# Patient Record
Sex: Female | Born: 1945 | Race: White | Hispanic: No | Smoking: Former smoker
Health system: Southern US, Community
[De-identification: ages and names within clinical notes are randomized; demographics above are authoritative.]

## PROBLEM LIST (undated history)

## (undated) ENCOUNTER — Ambulatory Visit: Admission: EM | Source: Home / Self Care

## (undated) DIAGNOSIS — I10 Essential (primary) hypertension: Secondary | ICD-10-CM

## (undated) DIAGNOSIS — I639 Cerebral infarction, unspecified: Secondary | ICD-10-CM

## (undated) DIAGNOSIS — F419 Anxiety disorder, unspecified: Secondary | ICD-10-CM

## (undated) DIAGNOSIS — F32A Depression, unspecified: Secondary | ICD-10-CM

## (undated) DIAGNOSIS — F329 Major depressive disorder, single episode, unspecified: Secondary | ICD-10-CM

## (undated) HISTORY — DX: Depression, unspecified: F32.A

## (undated) HISTORY — DX: Cerebral infarction, unspecified: I63.9

## (undated) HISTORY — DX: Anxiety disorder, unspecified: F41.9

## (undated) HISTORY — DX: Major depressive disorder, single episode, unspecified: F32.9

---

## 2000-07-29 HISTORY — PX: DILATION AND CURETTAGE OF UTERUS: SHX78

## 2003-07-30 HISTORY — PX: FACELIFT: SHX1566

## 2013-03-19 DIAGNOSIS — F411 Generalized anxiety disorder: Secondary | ICD-10-CM | POA: Insufficient documentation

## 2015-07-04 ENCOUNTER — Emergency Department (HOSPITAL_COMMUNITY): Payer: Medicare Other

## 2015-07-04 ENCOUNTER — Encounter (HOSPITAL_COMMUNITY): Payer: Self-pay

## 2015-07-04 ENCOUNTER — Emergency Department (HOSPITAL_COMMUNITY)
Admission: EM | Admit: 2015-07-04 | Discharge: 2015-07-04 | Disposition: A | Discharge status: Home / Self Care | Payer: Medicare Other | Attending: Emergency Medicine | Admitting: Emergency Medicine

## 2015-07-04 DIAGNOSIS — IMO0001 Reserved for inherently not codable concepts without codable children: Secondary | ICD-10-CM

## 2015-07-04 DIAGNOSIS — Y998 Other external cause status: Secondary | ICD-10-CM | POA: Diagnosis not present

## 2015-07-04 DIAGNOSIS — W01198A Fall on same level from slipping, tripping and stumbling with subsequent striking against other object, initial encounter: Secondary | ICD-10-CM | POA: Insufficient documentation

## 2015-07-04 DIAGNOSIS — Z23 Encounter for immunization: Secondary | ICD-10-CM | POA: Diagnosis not present

## 2015-07-04 DIAGNOSIS — S8991XA Unspecified injury of right lower leg, initial encounter: Secondary | ICD-10-CM | POA: Diagnosis present

## 2015-07-04 DIAGNOSIS — Y9389 Activity, other specified: Secondary | ICD-10-CM | POA: Diagnosis not present

## 2015-07-04 DIAGNOSIS — R030 Elevated blood-pressure reading, without diagnosis of hypertension: Secondary | ICD-10-CM | POA: Diagnosis not present

## 2015-07-04 DIAGNOSIS — Z79899 Other long term (current) drug therapy: Secondary | ICD-10-CM | POA: Insufficient documentation

## 2015-07-04 DIAGNOSIS — Y9289 Other specified places as the place of occurrence of the external cause: Secondary | ICD-10-CM | POA: Diagnosis not present

## 2015-07-04 DIAGNOSIS — Z87891 Personal history of nicotine dependence: Secondary | ICD-10-CM | POA: Diagnosis not present

## 2015-07-04 DIAGNOSIS — L03115 Cellulitis of right lower limb: Secondary | ICD-10-CM | POA: Insufficient documentation

## 2015-07-04 MED ORDER — TETANUS-DIPHTH-ACELL PERTUSSIS 5-2.5-18.5 LF-MCG/0.5 IM SUSP
0.5000 mL | Freq: Once | INTRAMUSCULAR | Status: AC
  Administered 2015-07-04: 0.5 mL via INTRAMUSCULAR
  Filled 2015-07-04: qty 0.5

## 2015-07-04 MED ORDER — DOXYCYCLINE HYCLATE 100 MG PO CAPS: 100.0000 mg | ORAL_CAPSULE | Freq: Two times a day (BID) | ORAL | Status: DC

## 2015-07-04 MED ORDER — TETANUS-DIPHTHERIA TOXOIDS TD 5-2 LFU IM INJ
0.5000 mL | INJECTION | Freq: Once | INTRAMUSCULAR | Status: DC
  Filled 2015-07-04: qty 0.5

## 2015-07-04 NOTE — Discharge Instructions (Signed)
1. Medications: doxycyline is your antibiotic; Please take all of your antibiotics until finished!, continue usual home medications 2. Treatment: rest, drink plenty of fluids 3. Follow Up: Please follow up with your primary doctor in 3 days for discussion of your diagnoses and further evaluation after today's visit; Please return to the ER for fever, worsening pain, any new or worsening symptoms, any additional concerns.

## 2015-07-04 NOTE — ED Notes (Signed)
Patient transported to X-ray 

## 2015-07-04 NOTE — ED Notes (Signed)
Pt fell on Thanksgiving day.  Continues to have leg pain and swelling.  Went to urgent care and sent here for cellulitis.  Also with elevated bp

## 2015-07-04 NOTE — Progress Notes (Signed)
69 yr old medicare covered female states she fell after Thanksgiving going out her sliding [place] and fell Pt states she sustained injury to the right side of her head, arm, leg and foot Pt noted with resolving bruises/abrasions Pt pointed out swelling of right foot  Pt confirmed pcp as Dr Wilhemena Durie EPIC updated  Pt very pleasant and cooperative

## 2015-07-04 NOTE — ED Provider Notes (Signed)
CSN: [id number]     Arrival date & time 07/04/15  1622 History   First MD Initiated Contact with Patient 07/04/15 1631     Chief Complaint  Patient presents with  . Leg Pain     (Consider location/radiation/quality/duration/timing/severity/associated sxs/prior Treatment) Patient is a 69 y.o. female presenting with leg pain. The history is provided by the patient and medical records. No language interpreter was used.  Leg Pain Associated symptoms: no back pain and no neck pain    Michele Tyler is a 69 y.o. female  With no pertinent PMH who presents to the Emergency Department complaining of right leg pain. Pt. States she fell on Thanksgiving, rolling her right ankle, and hitting right shin on sliding door tract. Patient notes immediate swelling to the shin area and superficial cuts. Pt. States the area has been healing well, swelling is improved, pain occurs with palpation and certain movements. States redness began to occur over the last two days, so she went to the urgent care to get leg checked - urgent care sent her here to further evaluation. Denies fever.    History reviewed. No pertinent past medical history. History reviewed. No pertinent past surgical history. History reviewed. No pertinent family history. Social History  Substance Use Topics  . Smoking status: Former Research scientist (life sciences)  . Smokeless tobacco: None  . Alcohol Use: Yes     Comment: social   OB History    No data available     Review of Systems  Constitutional: Negative.   HENT: Negative for congestion, rhinorrhea and sore throat.   Eyes: Negative for visual disturbance.  Respiratory: Negative for cough, shortness of breath and wheezing.   Cardiovascular: Negative.   Gastrointestinal: Negative for nausea, vomiting, abdominal pain, diarrhea and constipation.  Musculoskeletal: Positive for myalgias. Negative for back pain, arthralgias and neck pain.  Skin: Positive for color change.  Neurological: Negative for  dizziness, weakness and headaches.  Hematological: Does not bruise/bleed easily.      Allergies  Codeine  Home Medications   Prior to Admission medications   Medication Sig Start Date End Date Taking? Authorizing Provider  doxycycline (VIBRAMYCIN) 100 MG capsule Take 1 capsule (100 mg total) by mouth 2 (two) times daily. 07/04/15   Ozella Almond Ward, PA-C  naproxen sodium (ANAPROX) 220 MG tablet Take 220-440 mg by mouth daily as needed (pain).   Yes Historical Provider, MD  nortriptyline (PAMELOR) 25 MG capsule Take 25 mg by mouth at bedtime. 06/05/15  Yes Historical Provider, MD   BP 177/79 mmHg  Pulse 104  Temp(Src) 97.7 F (36.5 C) (Oral)  Resp 18  SpO2 100% Physical Exam  Constitutional: She is oriented to person, place, and time. She appears well-developed and well-nourished.  Alert and in no acute distress  HENT:  Head: Normocephalic and atraumatic.  Cardiovascular: Normal rate, regular rhythm, normal heart sounds and intact distal pulses.  Exam reveals no gallop and no friction rub.   No murmur heard. Pulmonary/Chest: Effort normal and breath sounds normal. No respiratory distress. She has no wheezes. She has no rales. She exhibits no tenderness.  Abdominal: She exhibits no mass. There is no rebound and no guarding.  Abdomen soft, non-tender, non-distended Bowel sounds positive in all four quadrants  Musculoskeletal: She exhibits no edema.  Right ankle swelling with no bony tenderness of malleoli or 5th metatarsal - moving extremity well, able to bear weight.   Neurological: She is alert and oriented to person, place, and time.  Bilateral lower extremities  neurovascularly intact.   Skin: Skin is warm and dry. No rash noted.     Psychiatric: She has a normal mood and affect. Her behavior is normal. Judgment and thought content normal.  Nursing note and vitals reviewed.   ED Course  Procedures (including critical care time) Labs Review Labs Reviewed - No data to  display  Imaging Review Dg Tibia/fibula Right  07/04/2015  CLINICAL DATA:  Status post fall 2 weeks ago, with persistent large soft tissue hematoma just distal to the right knee. Associated pain and discoloration. Initial encounter. EXAM: RIGHT TIBIA AND FIBULA - 2 VIEW COMPARISON:  None. FINDINGS: There is no evidence of fracture or dislocation. The tibia and fibula appear intact. Focal soft tissue swelling is noted along the anterior aspect of the proximal lower leg. The knee joint is grossly unremarkable. A fabella is noted. Mild subcortical cystic change is noted at the upper pole of the patella. No knee joint effusion is seen. The ankle mortise is incompletely assessed, but appears grossly unremarkable. No radiopaque foreign bodies are seen. IMPRESSION: No evidence of fracture or dislocation. Focal soft tissue swelling noted along the anterior aspect of the proximal lower leg. Electronically Signed   By: Garald Balding M.D.   On: 07/04/2015 18:28   I have personally reviewed and evaluated these images and lab results as part of my medical decision-making.   EKG Interpretation None      MDM   Final diagnoses:  Cellulitis of right lower extremity  Elevated blood pressure   Michele Tyler presents with likely cellulitis of right mid-shin - injury to this area with abrasions/swelling occurred 2/2 fall ~ 2 weeks ago. Pt. States swelling is improving and she has walked 3 miles every day since fall.   Xrays show only focal soft tissue swelling. Will dc to home with abx and return precautions. Discussed BP and recommended PCP follow up.   Patient seen by and discussed with Dr. Tamera Punt who agrees with treatment plan.    [facility] Ward, PA-C 07/04/15 1855  Malvin Johns, MD 07/05/15 0000

## 2015-11-06 LAB — LIPID PANEL
Cholesterol: 224 — AB (ref 0–200)
HDL: 97 — AB (ref 35–70)
LDL Cholesterol: 101
Triglycerides: 130 (ref 40–160)

## 2015-11-06 LAB — BASIC METABOLIC PANEL
BUN: 12 (ref 4–21)
Creatinine: 0.8 (ref 0.5–1.1)
Glucose: 89
Potassium: 4.5 (ref 3.4–5.3)
Sodium: 138 (ref 137–147)

## 2015-12-27 LAB — POCT ERYTHROCYTE SEDIMENTATION RATE, NON-AUTOMATED: Sed Rate: 2

## 2015-12-27 LAB — TSH: TSH: 0.64 (ref 0.41–5.90)

## 2018-01-21 ENCOUNTER — Ambulatory Visit: Payer: Medicare Other | Admitting: Family Medicine

## 2018-02-18 ENCOUNTER — Ambulatory Visit: Payer: Medicare Other | Admitting: Family Medicine

## 2018-02-18 ENCOUNTER — Encounter: Payer: Self-pay | Admitting: Family Medicine

## 2018-02-18 VITALS — BP 154/82 | HR 83 | Ht 64.0 in | Wt 118.5 lb

## 2018-02-18 DIAGNOSIS — Z8679 Personal history of other diseases of the circulatory system: Secondary | ICD-10-CM | POA: Diagnosis not present

## 2018-02-18 DIAGNOSIS — F39 Unspecified mood [affective] disorder: Secondary | ICD-10-CM | POA: Insufficient documentation

## 2018-02-18 DIAGNOSIS — I10 Essential (primary) hypertension: Secondary | ICD-10-CM | POA: Insufficient documentation

## 2018-02-18 MED ORDER — NORTRIPTYLINE HCL 25 MG PO CAPS: 25.0000 mg | ORAL_CAPSULE | Freq: Every day | ORAL | 0 refills | Status: DC

## 2018-02-18 MED ORDER — BUSPIRONE HCL 5 MG PO TABS: 5.0000 mg | ORAL_TABLET | Freq: Three times a day (TID) | ORAL | 1 refills | Status: DC

## 2018-02-18 NOTE — Progress Notes (Signed)
New patient office visit note:  Impression and Recommendations:    1. H/O: HTN (hypertension)   2. Mood disorder ([place])      Lifestyle - Discussed health philosophy, importance of diet and activity  -- Requested patient to create a healthcare maintenance appointment in order to complete a total physical.  - Recommended an additional appointment to obtain blood work three days prior to healthcare maintenance appointment.Counseled patient on necessity of fasting for accurate blood work  Mood - Recommended increasing buspar to 5mg  TID buspar to even out anxiety and mood. Counseled patient on risks and benefits of medication.  - Counseled patient on benefits of counseling, meditation, exercise and medication in conjunction to elevate and stabilize mood. Described the purpose of therapy in depth.   Blood Pressure - Explained goal HTN of 140/90 or less due to age and lack of comorbidity.   -BP elevated today; not at goal. Will check at home and bring in BP log next OV.  - Recommended continuing work to   [employer] Exam - Explained slack of necessity for PAP smear or Gyno exam due to age and lack of family history. Also explained medicare requirements for appointment coverage.  BMI - Explained correlation of increasing BMI and increasing risk for disease.  Explained to patient what BMI refers to, and what it means medically.    Told patient to think about it as a "medical risk stratification measurement" and how increasing BMI is associated with increasing risk/ or worsening state of various diseases such as hypertension, hyperlipidemia, diabetes, premature OA, depression etc.  -American Heart Association guidelines for healthy diet, basically Mediterranean diet, and exercise guidelines of 30 minutes 5 days per week or more discussed in detail.  -Health counseling performed.  All questions answered.  Colonoscopy -Discussed requirements for colonoscopy and encouraged patient  to create an appointment for a colonoscopy or stool analysis.   Education and routine counseling performed. Handouts provided.   Meds ordered this encounter  Medications  . busPIRone (BUSPAR) 5 MG tablet    Sig: Take 1 tablet (5 mg total) by mouth 3 (three) times daily.    Dispense:  90 tablet    Refill:  1  . nortriptyline (PAMELOR) 25 MG capsule    Sig: Take 1 capsule (25 mg total) by mouth at bedtime.    Dispense:  90 capsule    Refill:  0    Gross side effects, risk and benefits, and alternatives of medications discussed with patient.  Patient is aware that all medications have potential side effects and we are unable to predict every side effect or drug-drug interaction that may occur.  Expresses verbal understanding and consents to current therapy plan and treatment regimen.  Return for Chronic OV w me near future & FBW 2-3d prior.  Please see AVS handed out to patient at the end of our visit for further patient instructions/ counseling done pertaining to today's office visit.    Note: This document was prepared using Dragon voice recognition software and may include unintentional dictation errors.     This document serves as a record of services personally performed by Mellody Dance, MD. It was created on her behalf by Georga Bora, a trained medical scribe. The creation of this record is based on the scribe's personal observations and the provider's statements to them.   I have reviewed the above medical documentation for accuracy and completeness and I concur.  Mellody Dance 02/19/18 8:14 AM    ----------------------------------------------------------------------------------------------------------------------  Subjective:    Chief complaint:   Chief Complaint  Patient presents with  . Establish Care   HPI: Michele Tyler is a pleasant 72 y.o. female who presents to [place] [facility] today to review their medical history with me  and establish care.   I asked the patient to review their chronic problem list with me to ensure everything was updated and accurate.    All recent office visits with other providers, any medical records that patient brought in etc  - I reviewed today.     We asked pt to get [place] their medical records from [facility] providers/ specialists that they had seen within the past 3-5 years- if they are in private practice and/or do not work for [employer], [facility], [place], [place] or [employer] owned practice.  Told them to call their specialists to clarify this if they are not sure.    Medical History  - Patient suffers from depression and anxiety. Takes Busibar and Pemoline  - She complains of struggling with sleep. Declines interest or belief in therapists or life coaches.  - Reports SSRI's increased anxiety, failed drug classes of effexer and wellbutrin, and cymbalta.  - States she wakes with a panic attack every morning, and anxiety waxes and wanes throughout day.  - Patient states frustration with medicare insurance and lack of physical and blood work.  - She has fears of fasting for colonoscopy and requested information about ways to prepare.  - PAP Smear in 2017, normal  Surgical History - D&C in 2002  - Facelift procedure in 2005  Social History  - Alcohol use; 1-2 glasses of wine per week.  - Quit smoking around 1988 and smoked roughly a pack a day.  - [place], worked for [employer] and retired around 2014.   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 Has 6 dogsincluding fosters, and lives alone.  - Patient is non-religious.   Family History  - Father suffered from depression, HTN, died in 64 at 15. States father likely died from "old age" - States parents decline of  mobility with age lead to her decision to excerics regularly  - Mother had high cholesterol, HTN, anxiety.      Wt Readings from Last 3 Encounters:  02/18/18 118 lb 8 oz (53.8 kg)   BP Readings from Last 3 Encounters:  02/18/18 (!) 154/82  07/04/15 (!) 168/48   Pulse Readings from Last 3 Encounters:  02/18/18 83  07/04/15 99   BMI Readings from Last 3 Encounters:  02/18/18 20.34 kg/m    Patient Care Team    Relationship Specialty Notifications Start End  Mellody Dance, DO PCP - General Family Medicine  02/18/18     Patient Active Problem List   Diagnosis Date Noted  . H/O: HTN (hypertension) 02/18/2018  . Mood disorder ([place]) 02/18/2018  . Generalized anxiety disorder 03/19/2013     Past Medical History:  Diagnosis Date  . Anxiety   . Depression      Past Medical History:  Diagnosis Date  . Anxiety   . Depression      Past Surgical History:  Procedure Laterality Date  . DILATION AND CURETTAGE OF UTERUS  2002  . FACELIFT  2005  Family History  Problem Relation Age of Onset  . Hypertension Mother   . Depression Father   . Hypertension Father      Social History   Substance and Sexual Activity  Drug Use No     Social History   Substance and Sexual Activity  Alcohol Use Yes  . Alcohol/week: 0.6 - 1.2 oz  . Types: 1 - 2 Standard drinks or equivalent per week   Comment: social     Social History   Tobacco Use  Smoking Status Former Smoker  . Packs/day: 1.00  . Types: Cigarettes  . Last attempt to quit: 1988  . Years since quitting: 31.5  Smokeless Tobacco Never Used     Current Meds  Medication Sig  . nortriptyline (PAMELOR) 25 MG capsule Take 1 capsule (25 mg total) by mouth at bedtime.  . [DISCONTINUED] busPIRone (BUSPAR) 15 MG tablet Take 1 tablet by mouth daily.  . [DISCONTINUED] nortriptyline (PAMELOR) 25 MG capsule Take 25 mg by mouth at bedtime.    Allergies: Codeine   Review of Systems  Constitutional:  Positive for fever. Negative for chills, diaphoresis, malaise/fatigue and weight loss.  HENT: Negative for congestion, sore throat and tinnitus.   Eyes: Negative for blurred vision, double vision and photophobia.  Respiratory: Negative for cough and wheezing.   Cardiovascular: Negative for chest pain and palpitations.  Gastrointestinal: Negative for blood in stool, diarrhea, nausea and vomiting.  Genitourinary: Negative for dysuria, frequency and urgency.  Musculoskeletal: Negative for joint pain and myalgias.  Skin: Negative for itching and rash.  Neurological: Negative for dizziness, focal weakness, weakness and headaches.  Endo/Heme/Allergies: Negative for environmental allergies and polydipsia. Does not bruise/bleed easily.  Psychiatric/Behavioral: Positive for depression (chronic). Negative for memory loss. The patient is nervous/anxious (chronic). The patient does not have insomnia.      Objective:   Blood pressure (!) 154/82, pulse 83, height 5\' 4"  (1.626 m), weight 118 lb 8 oz (53.8 kg), SpO2 98 %. Body mass index is 20.34 kg/m. General: Well Developed, well nourished, and in no acute distress.  Neuro: Alert and oriented x3, extra-ocular muscles intact, sensation grossly intact.  HEENT:[place]/AT, PERRLA, neck supple, No carotid bruits Skin: no gross rashes  Cardiac: Regular rate and rhythm Respiratory: Essentially clear to auscultation bilaterally. Not using accessory muscles, speaking in full sentences.  Abdominal: not grossly distended Musculoskeletal: Ambulates w/o diff, FROM * 4 ext.  Vasc: less 2 sec cap RF, warm and pink  Psych:  No HI/SI, judgement and insight good, Euthymic mood. Full Affect.    No results found for this or any previous visit (from the past 2160 hour(s)).

## 2018-02-18 NOTE — Patient Instructions (Signed)
Please realize, EXERCISE IS MEDICINE!  -  American Heart Association [facility]) guidelines for exercise : If you are in good health, without any medical conditions, you should engage in 150 minutes of moderate intensity aerobic activity per week.  This means you should be huffing and puffing throughout your workout.   Engaging in regular exercise will improve brain function and memory, as well as improve mood, boost immune system and help with weight management.  As well as the other, more well-known effects of exercise such as decreasing blood sugar levels, decreasing blood pressure,  and decreasing bad cholesterol levels/ increasing good cholesterol levels.     -  The AHA strongly endorses consumption of a diet that contains a variety of foods from all the food categories with an emphasis on fruits and vegetables; fat-free and low-fat dairy products; cereal and grain products; legumes and nuts; and fish, poultry, and/or extra lean meats.    Excessive food intake, especially of foods high in saturated and trans fats, sugar, and salt, should be avoided.    Adequate water intake of roughly 1/2 of your weight in pounds, should equal the ounces of water per day you should drink.  So for instance, if you're 200 pounds, that would be 100 ounces of water per day.         Mediterranean Diet  Why follow it? Research shows. . Those who follow the Mediterranean diet have a reduced risk of heart disease  . The diet is associated with a reduced incidence of Parkinson's and Alzheimer's diseases . People following the diet may have longer life expectancies and lower rates of chronic diseases  . The Dietary Guidelines for Americans recommends the Mediterranean diet as an eating plan to promote health and prevent disease  What Is the Mediterranean Diet?  . Healthy eating plan based on typical foods and recipes of Mediterranean-style cooking . The diet is primarily a plant based diet; these foods should make up a  majority of meals   Starches - Plant based foods should make up a majority of meals - They are an important sources of vitamins, minerals, energy, antioxidants, and fiber - Choose whole grains, foods high in fiber and minimally processed items  - Typical grain sources include wheat, oats, barley, corn, brown rice, bulgar, farro, millet, polenta, couscous  - Various types of beans include chickpeas, lentils, fava beans, black beans, white beans   Fruits  Veggies - Large quantities of antioxidant rich fruits & veggies; 6 or more servings  - Vegetables can be eaten raw or lightly drizzled with oil and cooked  - Vegetables common to the traditional Mediterranean Diet include: artichokes, arugula, beets, broccoli, brussel sprouts, cabbage, carrots, celery, collard greens, cucumbers, eggplant, kale, leeks, lemons, lettuce, mushrooms, okra, onions, peas, peppers, potatoes, pumpkin, radishes, rutabaga, shallots, spinach, sweet potatoes, turnips, zucchini - Fruits common to the Mediterranean Diet include: apples, apricots, avocados, cherries, clementines, dates, figs, grapefruits, grapes, melons, nectarines, oranges, peaches, pears, pomegranates, strawberries, tangerines  Fats - Replace butter and margarine with healthy oils, such as olive oil, canola oil, and tahini  - Limit nuts to no more than a handful a day  - Nuts include walnuts, almonds, pecans, pistachios, pine nuts  - Limit or avoid candied, honey roasted or heavily salted nuts - Olives are central to the Mediterranean diet - can be eaten whole or used in a variety of dishes   Meats Protein - Limiting red meat: no more than a few times a month -  When eating red meat: choose lean cuts and keep the portion to the size of deck of cards - Eggs: approx. 0 to 4 times a week  - Fish and lean poultry: at least 2 a week  - Healthy protein sources include, chicken, [place], lean beef, lamb - Increase intake of seafood such as tuna, salmon, trout,  mackerel, shrimp, scallops - Avoid or limit high fat processed meats such as sausage and bacon  Dairy - Include moderate amounts of low fat dairy products  - Focus on healthy dairy such as fat free yogurt, skim milk, low or reduced fat cheese - Limit dairy products higher in fat such as whole or 2% milk, cheese, ice cream  Alcohol - Moderate amounts of red wine is ok  - No more than 5 oz daily for women (all ages) and men older than age 72  - No more than 10 oz of wine daily for men younger than 12  Other - Limit sweets and other desserts  - Use herbs and spices instead of salt to flavor foods  - Herbs and spices common to the traditional Mediterranean Diet include: basil, bay leaves, chives, cloves, cumin, fennel, garlic, lavender, marjoram, mint, oregano, parsley, pepper, rosemary, sage, savory, sumac, tarragon, thyme   It's not just a diet, it's a lifestyle:  . The Mediterranean diet includes lifestyle factors typical of those in the region  . Foods, drinks and meals are best eaten with others and savored . Daily physical activity is important for overall good health . This could be strenuous exercise like running and aerobics . This could also be more leisurely activities such as walking, housework, yard-work, or taking the stairs . Moderation is the key; a balanced and healthy diet accommodates most foods and drinks . Consider portion sizes and frequency of consumption of certain foods   Meal Ideas & Options:  . Breakfast:  o Whole wheat toast or whole wheat English muffins with peanut butter & hard boiled egg o Steel cut oats topped with apples & cinnamon and skim milk  o Fresh fruit: banana, strawberries, melon, berries, peaches  o Smoothies: strawberries, bananas, greek yogurt, peanut butter o Low fat greek yogurt with blueberries and granola  o Egg white omelet with spinach and mushrooms o Breakfast couscous: whole wheat couscous, apricots, skim milk, cranberries  . Sandwiches:   o Hummus and grilled vegetables (peppers, zucchini, squash) on whole wheat bread   o Grilled chicken on whole wheat pita with lettuce, tomatoes, cucumbers or tzatziki  o Tuna salad on whole wheat bread: tuna salad made with greek yogurt, olives, red peppers, capers, green onions o Garlic rosemary lamb pita: lamb sauted with garlic, rosemary, salt & pepper; add lettuce, cucumber, greek yogurt to pita - flavor with lemon juice and black pepper  . Seafood:  o Mediterranean grilled salmon, seasoned with garlic, basil, parsley, lemon juice and black pepper o Shrimp, lemon, and spinach whole-grain pasta salad made with low fat greek yogurt  o Seared scallops with lemon orzo  o Seared tuna steaks seasoned salt, pepper, coriander topped with tomato mixture of olives, tomatoes, olive oil, minced garlic, parsley, green onions and cappers  . Meats:  o Herbed greek chicken salad with kalamata olives, cucumber, feta  o Red bell peppers stuffed with spinach, bulgur, lean ground beef (or lentils) & topped with feta   o Kebabs: skewers of chicken, tomatoes, onions, zucchini, squash  o [place] burgers: made with red onions, mint, dill, lemon juice, feta  cheese topped with roasted red peppers . Vegetarian o Cucumber salad: cucumbers, artichoke hearts, celery, red onion, feta cheese, tossed in olive oil & lemon juice  o Hummus and whole grain pita points with a greek salad (lettuce, tomato, feta, olives, cucumbers, red onion) o Lentil soup with celery, carrots made with vegetable broth, garlic, salt and pepper  o Tabouli salad: parsley, bulgur, mint, scallions, cucumbers, tomato, radishes, lemon juice, olive oil, salt and pepper.    How to Increase Your Level of Physical Activity  Getting regular physical activity is important for your overall health and well-being. Most people do not get enough exercise. There are easy ways to increase your level of physical activity, even if you have not been very active in  the past or you are just starting out. Why is physical activity important? Physical activity has many short-term and long-term health benefits. Regular exercise can:  Help you lose weight or maintain a healthy weight.  Strengthen your muscles and bones.  Boost your mood and improve self-esteem.  Reduce your risk of certain long-term (chronic) diseases, like heart disease, cancer, and diabetes.  Help you stay capable of walking and moving around (mobile) as you age.  Prevent accidents, such as falls, as you age.  Increase life expectancy.  What are the benefits of being physically active on a regular basis? In addition to improving your physical health, being physically active on most days of the week can help you in ways that you may not expect. Benefits of regular physical activity may include:  Feeling good about your body.  Being able to move around more easily and for longer periods of time without getting tired (increased stamina).  Finding new sources of fun and enjoyment.  Meeting new people who share a common interest.  Being able to fight off illness better (enhanced immunity).  Being able to sleep better.  What can happen if I am not physically active on a regular basis? Not getting enough physical activity can lead to an unhealthy lifestyle and future health problems. This can increase your chances of:  Becoming overweight or obese.  Becoming sick.  Developing chronic illnesses, like heart disease or diabetes.  Having mental health problems, like depression or anxiety.  Having sleep problems.  Having trouble walking or getting yourself around (reduced mobility).  Injuring yourself in a fall as you get older.  What steps can I take to be more physically active?  Check with your health care provider about how to get started. Ask your health care provider what activities are safe for you.  Start out slowly. Walking or doing some simple chair exercises is  a good place to start, especially if you have not been active before or for a long time.  Try to find activities that you enjoy. You are more likely to commit to an exercise routine if it does not feel like a chore.  If you have bone or joint problems, choose low-impact exercises, like walking or swimming.  Include physical activity in your everyday routine.  Invite friends or family members to exercise with you. This also will help you commit to your workout plan.  Set goals that you can work toward.  Aim for at least 150 minutes of moderate-intensity exercise each week. Examples of moderate-intensity exercise include walking or riding a bike. Where to find more information:  Centers for Disease Control and Prevention: [employer]is  [employer] on [employer] www.http://villegas.org/  ChooseMyPlate: [employer] Contact a health  care provider if:  You have headaches, muscle aches, or joint pain.  You feel dizzy or light-headed while exercising.  You faint.  You have chest pain while exercising. Summary  Exercise benefits your mind and body at any age, even if you are just starting out.  If you have a chronic illness or have not been active for a while, check with your health care provider before increasing your physical activity.  Choose activities that are safe and enjoyable for you.Ask your health care provider what activities are safe for you.  Start slowly. Tell your health care provider if you have problems as you start to increase your activity level. This information is not intended to replace advice given to you by your health care provider. Make sure you discuss any questions you have with your health care provider. Document Released: 07/04/2016 Document Revised: 07/04/2016 Document Reviewed: 07/04/2016 Elsevier Interactive Patient Education  [employer].

## 2018-03-20 ENCOUNTER — Other Ambulatory Visit: Payer: Medicare Other

## 2018-03-20 ENCOUNTER — Other Ambulatory Visit: Payer: Self-pay

## 2018-03-20 DIAGNOSIS — Z0000 Encounter for general adult medical examination without abnormal findings: Secondary | ICD-10-CM | POA: Diagnosis not present

## 2018-03-20 DIAGNOSIS — Z8679 Personal history of other diseases of the circulatory system: Secondary | ICD-10-CM | POA: Diagnosis not present

## 2018-03-21 LAB — COMPREHENSIVE METABOLIC PANEL
ALT: 14 IU/L (ref 0–32)
AST: 20 IU/L (ref 0–40)
Albumin/Globulin Ratio: 2 (ref 1.2–2.2)
Albumin: 4.3 g/dL (ref 3.5–4.8)
Alkaline Phosphatase: 71 IU/L (ref 39–117)
BUN/Creatinine Ratio: 14 (ref 12–28)
BUN: 10 mg/dL (ref 8–27)
Bilirubin Total: 0.4 mg/dL (ref 0.0–1.2)
CO2: 25 mmol/L (ref 20–29)
Calcium: 9.5 mg/dL (ref 8.7–10.3)
Chloride: 100 mmol/L (ref 96–106)
Creatinine, Ser: 0.72 mg/dL (ref 0.57–1.00)
GFR calc Af Amer: 98 mL/min/{1.73_m2} (ref >59)
GFR calc non Af Amer: 85 mL/min/{1.73_m2} (ref >59)
Globulin, Total: 2.1 g/dL (ref 1.5–4.5)
Glucose: 89 mg/dL (ref 65–99)
Potassium: 4.5 mmol/L (ref 3.5–5.2)
Sodium: 137 mmol/L (ref 134–144)
Total Protein: 6.4 g/dL (ref 6.0–8.5)

## 2018-03-21 LAB — CBC WITH DIFFERENTIAL/PLATELET
Basophils Absolute: 0 10*3/uL (ref 0.0–0.2)
Basos: 1 %
EOS (ABSOLUTE): 0.1 10*3/uL (ref 0.0–0.4)
Eos: 3 %
Hematocrit: 39.6 % (ref 34.0–46.6)
Hemoglobin: 13.2 g/dL (ref 11.1–15.9)
Immature Grans (Abs): 0 10*3/uL (ref 0.0–0.1)
Immature Granulocytes: 0 %
Lymphocytes Absolute: 0.8 10*3/uL (ref 0.7–3.1)
Lymphs: 19 %
MCH: 28.8 pg (ref 26.6–33.0)
MCHC: 33.3 g/dL (ref 31.5–35.7)
MCV: 87 fL (ref 79–97)
Monocytes Absolute: 0.4 10*3/uL (ref 0.1–0.9)
Monocytes: 10 %
Neutrophils Absolute: 2.7 10*3/uL (ref 1.4–7.0)
Neutrophils: 67 %
Platelets: 417 10*3/uL (ref 150–450)
RBC: 4.58 x10E6/uL (ref 3.77–5.28)
RDW: 12.9 % (ref 12.3–15.4)
WBC: 4.1 10*3/uL (ref 3.4–10.8)

## 2018-03-21 LAB — HEMOGLOBIN A1C
Est. average glucose Bld gHb Est-mCnc: 108 mg/dL
Hgb A1c MFr Bld: 5.4 % (ref 4.8–5.6)

## 2018-03-21 LAB — LIPID PANEL
Chol/HDL Ratio: 2.4 ratio (ref 0.0–4.4)
Cholesterol, Total: 210 mg/dL — ABNORMAL HIGH (ref 100–199)
HDL: 87 mg/dL (ref >39)
LDL Calculated: 108 mg/dL — ABNORMAL HIGH (ref 0–99)
Triglycerides: 76 mg/dL (ref 0–149)
VLDL Cholesterol Cal: 15 mg/dL (ref 5–40)

## 2018-03-21 LAB — T4, FREE: Free T4: 1.12 ng/dL (ref 0.82–1.77)

## 2018-03-21 LAB — TSH: TSH: 0.803 u[IU]/mL (ref 0.450–4.500)

## 2018-03-21 LAB — VITAMIN D 25 HYDROXY (VIT D DEFICIENCY, FRACTURES): Vit D, 25-Hydroxy: 46.9 ng/mL (ref 30.0–100.0)

## 2018-03-25 ENCOUNTER — Encounter: Payer: Self-pay | Admitting: Family Medicine

## 2018-03-25 ENCOUNTER — Ambulatory Visit: Payer: Medicare Other | Admitting: Family Medicine

## 2018-03-25 VITALS — BP 136/84 | HR 84 | Ht 64.0 in | Wt 117.0 lb

## 2018-03-25 DIAGNOSIS — E7800 Pure hypercholesterolemia, unspecified: Secondary | ICD-10-CM | POA: Insufficient documentation

## 2018-03-25 DIAGNOSIS — E785 Hyperlipidemia, unspecified: Secondary | ICD-10-CM

## 2018-03-25 DIAGNOSIS — F411 Generalized anxiety disorder: Secondary | ICD-10-CM | POA: Diagnosis not present

## 2018-03-25 DIAGNOSIS — Z1211 Encounter for screening for malignant neoplasm of colon: Secondary | ICD-10-CM

## 2018-03-25 DIAGNOSIS — F39 Unspecified mood [affective] disorder: Secondary | ICD-10-CM

## 2018-03-25 DIAGNOSIS — G4700 Insomnia, unspecified: Secondary | ICD-10-CM | POA: Diagnosis not present

## 2018-03-25 MED ORDER — BUSPIRONE HCL 15 MG PO TABS: 5.0000 mg | ORAL_TABLET | Freq: Three times a day (TID) | ORAL | 1 refills | Status: DC

## 2018-03-25 MED ORDER — NORTRIPTYLINE HCL 25 MG PO CAPS: 25.0000 mg | ORAL_CAPSULE | Freq: Every day | ORAL | 1 refills | Status: DC

## 2018-03-25 NOTE — Progress Notes (Signed)
Assessment and plan:  1. 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 Generalized anxiety disorder   3. Insomnia, unspecified type   4. Hyperlipidemia, unspecified hyperlipidemia type   5. Screening for colon cancer     Mood -Mood is poorly controlled  -Recommend she more consistently take her BuSpar 3 times daily, which she has not been doing.  -Recommended visiting a psychiatrist for further evaluation of medications; pt declined  -Patient declined going to a psychologist/ life coach for CBT- states she "does not want to pay someone to talk to her", despite me telling her about what they can offer her  -Educated pt on additional antidepressants and anti-anxiety medications such as SSRIs to combine with current medications; pt declines  -Discussed potentially increasing buspar dose and being more consistent to help with mood  -Discussed history of buspar and pamelar prescriptions from previous provider  -Extensively discussed different classes of drugs and 7 specific medications that have been proven to help with depression  -Educated pt on non-pharmaceutical options for managing stress such as increasing exercise, prayer, and spending time with friends  -Explained that depression and anxiety are medical conditions and that medical treatment there is not shame in seeking treatment   HTN -Essentially controlled HTN -Discussed impact of stress and anxiety on bp -Discussed impact of HTN on cardiovascular health and risk of heart attack or stroke -Requested pt bring in a bp log with next OV in order to determine need of medical treatment -Discussed methods of improving blood pressure through exercise, healthy diet, and relieving stress -Educated pt on goal bp for age -Encouraged pt to work on decreasing bp due to ASCVD risk    Cholesterol  -Encouraged pt  to consider beginning a cholesterol medication; pt declined -Discussed impact of trans, saturated and unsaturated fats on overall cholesterol levels -Educated impact of fatty carbs on triglyceride levels and how to maintain low levels -Discussed ways to find further information about high cholesterol foods to avoid -Explained ASCVD risk and impact of risk on decision about cholesterol medication  The 10-year ASCVD risk score Michele Tyler DC Jr., et al., 2013) is: 9.9%   Values used to calculate the score:     Age: 26 years     Sex: Female     Is Non-Hispanic African American: No     Diabetic: No     Tobacco smoker: No     Systolic Blood Pressure: 366 mmHg     Is BP treated: No     HDL Cholesterol: 87 mg/dL     Total Cholesterol: 210 mg/dL   Blood Panel  -Discussed healthy levels of vitamin D and calcium and amount to look for in supplements -Discussed impact of vitamins such as B12, B6, and ingredients in pt supplements -Discussed AST and ALT roles in determining liver function -Discussed BUN, serine/creatinine level in determining kidney function -Discussed CBC results and indications of each level tested  GI  -Recommended Beano or Gas-x to alleviate issues -Discussed use of a food journal to identify triggers and possible foods that cause gas  Pt was in the office today for 32.5+ minutes, with over 50% time spent in face to face counseling of patients various medical conditions, treatment plans of those medical conditions including medicine management and lifestyle modification, strategies to improve health and well being; and in coordination of care. SEE ABOVE TREATMENT PLAN FOR DETAILS   Return for 13mo  mood f/up.   Anticipatory guidance and routine counseling done re: condition, txmnt options and need for follow up. All questions of patient's were answered.   Gross side effects, risk and benefits, and alternatives of medications discussed with patient.  Patient is aware that all  medications have potential side effects and we are unable to predict every sideeffect or drug-drug interaction that may occur.  Expresses verbal understanding and consents to current therapy plan and treatment regiment.  Please see AVS handed out to patient at the end of our visit for additional patient instructions/ counseling done pertaining to today's office visit.  Note: This document was prepared using Dragon voice recognition software and may include unintentional dictation errors.    This document serves as a record of services personally performed by Mellody Dance, MD. It was created on her behalf by Georga Bora, a trained medical scribe. The creation of this record is based on the scribe's personal observations and the provider's statements to them.   I have reviewed the above medical documentation for accuracy and completeness and I concur.  Mellody Dance 03/26/18 2:28 PM   ----------------------------------------------------------------------------------------------------------------------  Subjective:   CC:   Michele Tyler is a 72 y.o. female who presents to [place] [facility] today for review and discussion of recent bloodwork that was done and discussion of mood.  All recent blood work that we ordered was reviewed with patient today.  Patient was counseled on all abnormalities and we discussed dietary and lifestyle changes that could help those values (also medications when appropriate).  Extensive health counseling performed and all patient's concerns/ questions were addressed.   Blood Panel 03/20/2018 -AST/ALT WNL -BUN/ Serine-Creatinine WNL -A1C at 5.4 -TSH/T3/T4 WNL  Lifestyle -Pt walks 60-90 min at least 3 times per week, roughly 3 miles each walk -States she also lifts weights "a couple days per week"  Mood -Last OV started Buspar TID, and pamelar for sleep -Previously saw Dr. Harless Litten for mood prescriptions, was taking buspar PRN    -States she has been making time to take the pills TID but has not been consistent  -Prefers the 8m size due to multiple scoring on the pill that allows her to get a 90 day prescription while still taking only 1/3 of pill at each time -Has not been experiencing SE  -Has been taking pamelar each night at night -Has not been experiencing SE from medications -Pt does not want to adjust sleep or mood medications -Says she has taken nortriptyline "since the 80's"  -Has been trying different medications since the 90's -Pt states she has "felt this way since her teenaged years" and elaborates to say she feels her depression is genetic as her parent struggled also -States she "feels like she'll have a heart attack because the anxiety and depression are just too high" -Has taken prozac in the past and states she could not sleep at all and her heart was pounding; believes "this class of drugs is just too much for me" -States she has taken paxil, celexa and in the 90's and felt it didn't work -States she has tried a lot of drugs and they did work but she felt "the dose is just to high and its just too much" -Doesn't believe she has taken trazedone and thought it was for pain  -States she doesn't believe in speaking to a psychiatrist because she feels "it's sad if you have pay someone to listen to your problems" and doesn't have a  pastor or religious leader -Says she's the listener in her friend groups and doesn't feel comfortable telling people about how she feels  CHOL HPI:  -Pt states she has been maintaining exercise levels and attempting to make good dietary choices -Is very hesitant to begin cholesterol medication and would like to recheck levels again before deciding  Last lipid panel as follows:  Lab Results  Component Value Date   CHOL 210 (H) 03/20/2018   HDL 87 03/20/2018   LDLCALC 108 (H) 03/20/2018   TRIG 76 03/20/2018   CHOLHDL 2.4 03/20/2018    Hepatic Function Latest Ref Rng  & Units 03/20/2018  Total Protein 6.0 - 8.5 g/dL 6.4  Albumin 3.5 - 4.8 g/dL 4.3  AST 0 - 40 IU/L 20  ALT 0 - 32 IU/L 14  Alk Phosphatase 39 - 117 IU/L 71  Total Bilirubin 0.0 - 1.2 mg/dL 0.4     HTN HPI: -Pt is not checking bp at home.  -Today, pt bp is at 140/85 -Believes her anxiety may be causing her bp to increase because she is "always so anxious"   - Smoking Status noted   - She denies new onset of: chest pain, exercise intolerance, shortness of breath, dizziness, visual changes, headache, lower extremity swelling or claudication.   Last 3 blood pressure readings in our office are as follows: BP Readings from Last 3 Encounters:  03/25/18 136/84  02/18/18 (!) 154/82  07/04/15 (!) 168/48    Filed Weights   03/25/18 1036  Weight: 117 lb (53.1 kg)         Wt Readings from Last 3 Encounters:  03/25/18 117 lb (53.1 kg)  02/18/18 118 lb 8 oz (53.8 kg)   BP Readings from Last 3 Encounters:  03/25/18 136/84  02/18/18 (!) 154/82  07/04/15 (!) 168/48   Pulse Readings from Last 3 Encounters:  03/25/18 84  02/18/18 83  07/04/15 99   BMI Readings from Last 3 Encounters:  03/25/18 20.08 kg/m  02/18/18 20.34 kg/m     Patient Care Team    Relationship Specialty Notifications Start End  Mellody Dance, DO PCP - General Family Medicine  02/18/18     Full medical history updated and reviewed in the office today  Patient Active Problem List   Diagnosis Date Noted  . Insomnia 03/25/2018  . Elevated LDL cholesterol level 03/25/2018  . Hyperlipidemia 03/25/2018  . H/O: HTN (hypertension) 02/18/2018  . Mood disorder ([place]) 02/18/2018  . Generalized anxiety disorder 03/19/2013    Past Medical History:  Diagnosis Date  . Anxiety   . Depression     Past Surgical History:  Procedure Laterality Date  . DILATION AND CURETTAGE OF UTERUS  2002  . FACELIFT  2005    Social History   Tobacco Use  . Smoking status: Former Smoker    Packs/day: 1.00    Types:  Cigarettes    Last attempt to quit: 1988    Years since quitting: 31.7  . Smokeless tobacco: Never Used  Substance Use Topics  . Alcohol use: Yes    Alcohol/week: 1.0 - 2.0 standard drinks    Types: 1 - 2 Standard drinks or equivalent per week    Comment: social    Family Hx: Family History  Problem Relation Age of Onset  . Hypertension Mother   . Depression Father   . Hypertension Father      Medications: Current Outpatient Medications  Medication Sig Dispense Refill  . Apple Cider Vinegar 600 MG  CAPS Take 1 capsule by mouth daily.    . Ascorbic Acid (VITAMIN C CR) 1500 MG TBCR Take 1 tablet by mouth daily.    . B Complex-Folic Acid (BALANCED X-528 PO) Take by mouth.    . busPIRone (BUSPAR) 15 MG tablet Take 0.5 tablets (7.5 mg total) by mouth 3 (three) times daily. 90 tablet 1  . CALCIUM-MAGNESUIUM-ZINC 333-133-8.3 MG TABS Take 4 tablets by mouth daily.    . Cholecalciferol (VITAMIN D3) 5000 units CAPS Take 1 capsule by mouth daily.    . Cyanocobalamin (B-12) 1000 MCG CAPS Take 1 capsule by mouth.    . Ferrous Sulfate (IRON) 325 (65 Fe) MG TABS Take 1 tablet by mouth daily.    . folic acid (FOLVITE) 413 MCG tablet Take 800 mcg by mouth daily.    . nortriptyline (PAMELOR) 25 MG capsule Take 1 capsule (25 mg total) by mouth at bedtime. 90 capsule 1  . Omega-3 Fatty Acids (OMEGA-3 FISH OIL PO) Take 1 capsule by mouth daily. Contains omega 3 450 mg and fish oil 1500 mg    . Soy Isoflavone 750 MG CAPS Take 1 capsule by mouth daily.    . TURMERIC PO Take 800 mg by mouth.    . vitamin A 8000 UNIT capsule Take 8,000 Units by mouth daily.    Marland Kitchen VITAMIN E-1000 PO Take 1 capsule by mouth daily.    . Vitamins-Lipotropics (BALANCED B-150 COMPLEX TR PO) Take 1 tablet by mouth daily. Contains b1 165m, b2 1548m b6 [id number] 150m40miacin 150 mg     No current facility-administered medications for this visit.     Allergies:  Allergies  Allergen Reactions  . Codeine Nausea Only      Review of Systems: General:   No F/C, wt loss Pulm:   No DIB, SOB, pleuritic chest pain Card:  No CP, palpitations Abd:  No n/v/d or pain Ext:  No inc edema from baseline  Objective:  Blood pressure 136/84, pulse 84, height _0  (1.626 m), weight 117 lb (53.1 kg), SpO2 98 %. Body mass index is 20.08 kg/m. Gen:   Well NAD, A and O *3 HEENT:    [place]/AT, EOMI,  MMM Lungs:   Normal work of breathing. CTA B/L, no Wh, rhonchi Heart:   RRR, S1, S2 WNL's, no MRG Abd:   No gross distention Exts:    warm, pink,  Brisk capillary refill, warm and well perfused.  Psych:    No HI/SI, judgement and insight good, Euthymic mood. Full Affect.   Recent Results (from the past 2160 hour(s))  VITAMIN D 25 Hydroxy (Vit-D Deficiency, Fractures)     Status: None   Collection Time: 03/20/18  8:27 AM  Result Value Ref Range   Vit D, 25-Hydroxy 46.9 30.0 - 100.0 ng/mL    Comment: Vitamin D deficiency has been defined by the [employer] guideline as a level of serum 25-OH vitamin D less than 20 ng/mL (1,2). The Endocrine Society went on to further define vitamin D insufficiency as a level between 21 and 29 ng/mL (2). 1. IOM (Institute of Medicine). 2010. Dietary reference    intakes for calcium and D. [place]    [employer] Holick MF, Binkley [place], Bischoff-Ferrari HA, et al.    Evaluation, treatment, and prevention of vitamin D    deficiency: an Endocrine Society clinical practice    guideline. JCEM. 2011 Jul; 96(7):1911-30.   TSH  Status: None   Collection Time: 03/20/18  8:27 AM  Result Value Ref Range   TSH 0.803 0.450 - 4.500 uIU/mL  Lipid panel     Status: Abnormal   Collection Time: 03/20/18  8:27 AM  Result Value Ref Range   Cholesterol, Total 210 (H) 100 - 199 mg/dL   Triglycerides 76 0 - 149 mg/dL   HDL 87 >39 mg/dL   VLDL Cholesterol Cal 15 5 - 40 mg/dL   LDL Calculated 108 (H) 0 - 99 mg/dL   Chol/HDL  Ratio 2.4 0.0 - 4.4 ratio    Comment:                                   T. Chol/HDL Ratio                                             Men  Women                               1/2 Avg.Risk  3.4    3.3                                   Avg.Risk  5.0    4.4                                2X Avg.Risk  9.6    7.1                                3X Avg.Risk 23.4   11.0   Hemoglobin A1c     Status: None   Collection Time: 03/20/18  8:27 AM  Result Value Ref Range   Hgb A1c MFr Bld 5.4 4.8 - 5.6 %    Comment:          Prediabetes: 5.7 - 6.4          Diabetes: >6.4          Glycemic control for adults with diabetes: <7.0    Est. average glucose Bld gHb Est-mCnc 108 mg/dL  Comprehensive metabolic panel     Status: None   Collection Time: 03/20/18  8:27 AM  Result Value Ref Range   Glucose 89 65 - 99 mg/dL   BUN 10 8 - 27 mg/dL   Creatinine, Ser 0.72 0.57 - 1.00 mg/dL   GFR calc non Af Amer 85 >59 mL/min/1.73   GFR calc Af Amer 98 >59 mL/min/1.73   BUN/Creatinine Ratio 14 12 - 28   Sodium 137 134 - 144 mmol/L   Potassium 4.5 3.5 - 5.2 mmol/L   Chloride 100 96 - 106 mmol/L   CO2 25 20 - 29 mmol/L   Calcium 9.5 8.7 - 10.3 mg/dL   Total Protein 6.4 6.0 - 8.5 g/dL   Albumin 4.3 3.5 - 4.8 g/dL   Globulin, Total 2.1 1.5 - 4.5 g/dL   Albumin/Globulin Ratio 2.0 1.2 - 2.2   Bilirubin Total 0.4 0.0 - 1.2 mg/dL   Alkaline Phosphatase 71 39 - 117 IU/L  AST 20 0 - 40 IU/L   ALT 14 0 - 32 IU/L  CBC with Differential/Platelet     Status: None   Collection Time: 03/20/18  8:27 AM  Result Value Ref Range   WBC 4.1 3.4 - 10.8 x10E3/uL   RBC 4.58 3.77 - 5.28 x10E6/uL   Hemoglobin 13.2 11.1 - 15.9 g/dL   Hematocrit 39.6 34.0 - 46.6 %   MCV 87 79 - 97 fL   MCH 28.8 26.6 - 33.0 pg   MCHC 33.3 31.5 - 35.7 g/dL   RDW 12.9 12.3 - 15.4 %   Platelets 417 150 - 450 x10E3/uL   Neutrophils 67 Not Estab. %   Lymphs 19 Not Estab. %   Monocytes 10 Not Estab. %   Eos 3 Not Estab. %   Basos 1 Not Estab. %    Neutrophils Absolute 2.7 1.4 - 7.0 x10E3/uL   Lymphocytes Absolute 0.8 0.7 - 3.1 x10E3/uL   Monocytes Absolute 0.4 0.1 - 0.9 x10E3/uL   EOS (ABSOLUTE) 0.1 0.0 - 0.4 x10E3/uL   Basophils Absolute 0.0 0.0 - 0.2 x10E3/uL   Immature Granulocytes 0 Not Estab. %   Immature Grans (Abs) 0.0 0.0 - 0.1 x10E3/uL  T4, free     Status: None   Collection Time: 03/20/18  8:27 AM  Result Value Ref Range   Free T4 1.12 0.82 - 1.77 ng/dL  Cologuard     Status: None   Collection Time: 03/31/18 12:00 AM  Result Value Ref Range   Cologuard

## 2018-03-25 NOTE — Patient Instructions (Addendum)
Please try to be consistent with your BuSpar taking one third of the tablet 3 times daily.  I believe this will help with mood.  If need be, we can always increase dose to get more effect if desired.  Also, please look into getting a blood pressure cuff, I have many patients that use the Omron blood pressure cuffs for a single person would be sufficient.  These run around $25-$35.  Also, make sure you take at least 1200 of calcium daily and let us know how much vitamin D you take daily.  We will see you in 6 months to recheck cholesterol otherwise, follow-up in about 2 to 3 months to see how you are doing on the consistent dose of the BuSpar and possibly modify those medications for mood.     Guidelines for a Low Cholesterol, Low Saturated Fat Diet   Fats - Limit total intake of fats and oils. - Avoid butter, stick margarine, shortening, lard, palm and coconut oils. - Limit mayonnaise, salad dressings, gravies and sauces, unless they are homemade with low-fat ingredients. - Limit chocolate. - Choose low-fat and nonfat products, such as low-fat mayonnaise, low-fat or non-hydrogenated peanut butter, low-fat or fat-free salad dressings and nonfat gravy. - Use vegetable oil, such as canola or olive oil. - Look for margarine that does not contain trans fatty acids. - Use nuts in moderate amounts. - Read ingredient labels carefully to determine both amount and type of fat present in foods. Limit saturated and trans fats! - Avoid high-fat processed and convenience foods.  Meats and Meat Alternatives - Choose fish, chicken, [place] and lean meats. - Use dried beans, peas, lentils and tofu. - Limit egg yolks to three to four per week. - If you eat red meat, limit to no more than three servings per week and choose loin or round cuts. - Avoid fatty meats, such as bacon, sausage, franks, luncheon meats and ribs. - Avoid all organ meats, including liver.  Dairy - Choose nonfat or low-fat milk,  yogurt and cottage cheese. - Most cheeses are high in fat. Choose cheeses made from non-fat milk, such as mozzarella and ricotta cheese. - Choose light or fat-free cream cheese and sour cream. - Avoid cream and sauces made with cream.  Fruits and Vegetables - Eat a wide variety of fruits and vegetables. - Use lemon juice, vinegar or "mist" olive oil on vegetables. - Avoid adding sauces, fat or oil to vegetables.  Breads, Cereals and Grains - Choose whole-grain breads, cereals, pastas and rice. - Avoid high-fat snack foods, such as granola, cookies, pies, pastries, doughnuts and croissants.  Cooking Tips - Avoid deep fried foods. - Trim visible fat off meats and remove skin from poultry before cooking. - Bake, broil, boil, poach or roast poultry, fish and lean meats. - Drain and discard fat that drains out of meat as you cook it. - Add little or no fat to foods. - Use vegetable oil sprays to grease pans for cooking or baking. - Steam vegetables. - Use herbs or no-oil marinades to flavor foods.

## 2018-03-31 DIAGNOSIS — Z1211 Encounter for screening for malignant neoplasm of colon: Secondary | ICD-10-CM | POA: Diagnosis not present

## 2018-03-31 LAB — COLOGUARD

## 2018-04-01 ENCOUNTER — Other Ambulatory Visit: Payer: Self-pay

## 2018-05-06 DIAGNOSIS — Z1231 Encounter for screening mammogram for malignant neoplasm of breast: Secondary | ICD-10-CM | POA: Diagnosis not present

## 2018-07-01 ENCOUNTER — Ambulatory Visit (INDEPENDENT_AMBULATORY_CARE_PROVIDER_SITE_OTHER): Payer: Medicare Other | Admitting: Family Medicine

## 2018-07-01 ENCOUNTER — Encounter: Payer: Self-pay | Admitting: Family Medicine

## 2018-07-01 VITALS — BP 138/82 | HR 91 | Temp 97.8°F | Ht 64.0 in | Wt 118.7 lb

## 2018-07-01 DIAGNOSIS — F411 Generalized anxiety disorder: Secondary | ICD-10-CM

## 2018-07-01 DIAGNOSIS — G4700 Insomnia, unspecified: Secondary | ICD-10-CM

## 2018-07-01 DIAGNOSIS — F39 Unspecified mood [affective] disorder: Secondary | ICD-10-CM

## 2018-07-01 DIAGNOSIS — R030 Elevated blood-pressure reading, without diagnosis of hypertension: Secondary | ICD-10-CM | POA: Diagnosis not present

## 2018-07-01 DIAGNOSIS — E785 Hyperlipidemia, unspecified: Secondary | ICD-10-CM

## 2018-07-01 MED ORDER — ATORVASTATIN CALCIUM 20 MG PO TABS: 10.0000 mg | ORAL_TABLET | Freq: Every day | ORAL | 3 refills | Status: DC

## 2018-07-01 NOTE — Progress Notes (Signed)
Impression and Recommendations:    1. 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 Generalized anxiety disorder   3. Insomnia, unspecified type   4. Hyperlipidemia, unspecified hyperlipidemia type   5. Elevated blood-pressure reading without diagnosis of hypertension-may be anxiety mediated     1. Hypertension - Blood pressure elevated on intake today.  - Educated patient today at length about the risks of high blood pressure, especially the long term risk of congestive heart failure.  - Strongly advised patient to check her blood pressure at home.  - Reviewed goal as under 140/90 on a regular basis.  - Reviewed ambulatory BP monitoring with patient.  Encouraged patient to check her blood pressure at least every other day, at different times of day. Keep log and bring in next OV.  - Lifestyle changes such as dash diet and engaging in a regular exercise program discussed with patient.   - Patient knows to send log of her blood pressures in to the office as soon as she measures them.  - Patient knows to call in if she experiences concerns.  2. Mood - Anxiety - Anxiety is controlled better at this time.  Per patient, feels less "spikes" of anxiety and more of a "baseline" anxiety.  - Pt has been taking 15 mg of Buspar per day.    - Advised patient to up her dose to 20 mg per day to use the rest of her 5 mg tablets.  Then have her prescription filled and take her 15 mg tablets as prescribed.  See med list today.  - Patient tolerating meds well without complication.  Denies S-E  - Reviewed the "spokes of the wheel" of mood and health management.  Stressed the importance of ongoing prudent habits, including regular exercise, appropriate sleep hygiene, healthful dietary habits, and prayer/meditation to relax.  - Patient declined counseling, declined SSRI, declined psychiatrist last appointment.  3. Hyperlipidemia - LDL  108 last check - Need for medication reviewed with patient today.  Pt declined cholesterol medications last appointment.  Patient agrees to begin cholesterol medications today.  - Patient knows to call in with concerns as advised.  - Patient will begin with half tablet and slowly wean up dose.  See med list today.  - Reviewed prudent practices with medication, taking it at bedtime.  Reviewed that as long as patient is adequately hydrated and engaging in regular physical activity, her S-E on medication should be minimized.  - Repeat fasting lipid panel and CMP in 3-4 months.  The 10-year ASCVD risk score Mikey Bussing DC Brooke Bonito., et al., 2013) is: 10.1%   Values used to calculate the score:     Age: 71 years     Sex: Female     Is Non-Hispanic African American: No     Diabetic: No     Tobacco smoker: No     Systolic Blood Pressure: 939 mmHg     Is BP treated: No     HDL Cholesterol: 87 mg/dL     Total Cholesterol: 210 mg/dL  - Discussed the dangers of high cholesterol in tandem with high blood pressure with patient today.  - Dietary changes such as low saturated & trans fat and low carb/ ketogenic diets discussed with patient.  Encouraged regular exercise.  4. Lifestyle & Preventative Health Maintenance - Advised patient to continue working toward exercising to improve overall mental, physical, and emotional health.    American  Heart Association guidelines for healthy diet, basically Mediterranean diet, and exercise guidelines of 30 minutes 5 days per week or more discussed in detail.  Health counseling performed.  All questions answered.  - Encouraged patient to engage in daily physical activity, especially a formal exercise routine.  Recommended that the patient eventually strive for at least 150 minutes of moderate cardiovascular activity per week according to guidelines established by the [facility].   - Healthy dietary habits encouraged, including low-carb, and high amounts of lean protein in  diet.   - Patient should also consume adequate amounts of water.   Education and routine counseling performed. Handouts provided.  Pt was interviewed and evaluated by me in the clinic today for 32.5+ minutes, with over 50% time spent in face to face counseling of patients various medical conditions, treatment plans of those medical conditions including medicine management and lifestyle modification, strategies to improve health and well being; and in coordination of care. SEE ABOVE TREATMENT PLAN FOR DETAILS  - Patient scheduled for 8:45 AM appointment.  Did not get in to see the patient until 9:00 AM.   Meds ordered this encounter  Medications  . atorvastatin (LIPITOR) 20 MG tablet    Sig: Take 0.5 tablets (10 mg total) by mouth at bedtime.    Dispense:  90 tablet    Refill:  3    Gross side effects, risk and benefits, and alternatives of medications and treatment plan in general discussed with patient.  Patient is aware that all medications have potential side effects and we are unable to predict every side effect or drug-drug interaction that may occur.   Patient will call with any questions prior to using medication if they have concerns.  Expresses verbal understanding and consents to current therapy and treatment regimen.  No barriers to understanding were identified.  Red flag symptoms and signs discussed in detail.  Patient expressed understanding regarding what to do in case of emergency\urgent symptoms  Please see AVS handed out to patient at the end of our visit for further patient instructions/ counseling done pertaining to today's office visit.   Return for 2) 6mo f/up mood, BP, Chol-started statin WITH FBW 3 d prior FLP, ALT.     Note:  This document was prepared using Dragon voice recognition software and may include unintentional dictation errors.   This document serves as a record of services personally performed by Mellody Dance, DO. It was created on her behalf by  Toni Amend, a trained medical scribe. The creation of this record is based on the scribe's personal observations and the provider's statements to them.   I have reviewed the above medical documentation for accuracy and completeness and I concur.  Mellody Dance, DO      ------------------------------------------------------------------------------------------------    Subjective:    CC:  Chief Complaint  Patient presents with  . Follow-up   HPI: Michele Tyler is a 72 y.o. female who presents to [place] [facility] today for follow-up of mood.  Elevated Blood Pressure - Hypertension Feels her blood pressure is "probably high on occasion" at home.  She has not been taking the time to check her blood pressure.  Mood - Anxiety Notes she's always nervous.    She has not been exercising lately.  "I'm thinking about cutting back on hours at work."  Works nine hours straight from 8:30 AM to 5:30 PM.  She has been taking her Buspar more regularly as recommended.  "I feel like  that's helping to smooth things out during the day."  "I think it keeps the anxiety from spiking."  Notes she feels "it's in my system, to keep that tamped down to some degree."  Denies feeling numb or unfeeling; instead confirms that her anxiety has become a "baseline" anxiety instead of "spikes."  Feels she drinks about 64 ounces of water per day.  She generally tries to walk six days per week, but having trouble walking these days since it gets dark out so early.  Patient has a fear of medication side-effects, and declined several changes to treatment plan last visit. At that time, patient declined cholesterol meds, declined counseling, declined SSRI, and declined psychiatrist.  This visit, she agrees to begin cholesterol meds after counseling and education.    Depression screen [facility] 2/9 07/01/2018 03/25/2018 02/18/2018  Decreased Interest 2 3 -  Down, Depressed, Hopeless 3 3 3     PHQ - 2 Score 5 6 3   Altered sleeping 1 0 0  Tired, decreased energy 1 0 0  Change in appetite 0 0 0  Feeling bad or failure about yourself  2 2 2   Trouble concentrating 0 1 0  Moving slowly or fidgety/restless 0 0 0  Suicidal thoughts 0 0 0  PHQ-9 Score 9 9 5   Difficult doing work/chores Somewhat difficult Somewhat difficult -     GAD 7 : Generalized Anxiety Score 07/01/2018 03/25/2018 03/25/2018  Nervous, Anxious, on Edge 3 2 3   Control/stop worrying 3 1 3   Worry too much - different things 3 1 3   Trouble relaxing 3 1 2   Restless 2 1 1   Easily annoyed or irritable 2 1 2   Afraid - awful might happen 3 0 2  Total GAD 7 Score 19 7 16   Anxiety Difficulty Very difficult - -     Wt Readings from Last 3 Encounters:  07/01/18 118 lb 11.2 oz (53.8 kg)  03/25/18 117 lb (53.1 kg)  02/18/18 118 lb 8 oz (53.8 kg)   BP Readings from Last 3 Encounters:  07/01/18 138/82  03/25/18 136/84  02/18/18 (!) 154/82   Pulse Readings from Last 3 Encounters:  07/01/18 91  03/25/18 84  02/18/18 83   BMI Readings from Last 3 Encounters:  07/01/18 20.37 kg/m  03/25/18 20.08 kg/m  02/18/18 20.34 kg/m         Patient Care Team    Relationship Specialty Notifications Start End  Mellody Dance, DO PCP - General Family Medicine  02/18/18      Patient Active Problem List   Diagnosis Date Noted  . Elevated blood-pressure reading without diagnosis of hypertension-may be anxiety mediated 07/01/2018  . Insomnia 03/25/2018  . Elevated LDL cholesterol level 03/25/2018  . Hyperlipidemia 03/25/2018  . H/O: HTN (hypertension) 02/18/2018  . Mood disorder ([place]) 02/18/2018  . Generalized anxiety disorder 03/19/2013    Past Medical history, Surgical history, Family history, Social history, Allergies and Medications have been entered into the medical record, reviewed and changed as needed.    Current Meds  Medication Sig  . Apple Cider Vinegar 600 MG CAPS Take 1 capsule by mouth daily.   . Ascorbic Acid (VITAMIN C CR) 1500 MG TBCR Take 1 tablet by mouth daily.  . B Complex-Folic Acid (BALANCED W-413 PO) Take by mouth.  . busPIRone (BUSPAR) 15 MG tablet Take 0.5 tablets (7.5 mg total) by mouth 3 (three) times daily.  Marland Kitchen CALCIUM-MAGNESUIUM-ZINC 333-133-8.3 MG TABS Take 4 tablets by mouth daily.  . Cholecalciferol (VITAMIN D3)  5000 units CAPS Take 1 capsule by mouth daily.  . Cyanocobalamin (B-12) 1000 MCG CAPS Take 1 capsule by mouth.  . Ferrous Sulfate (IRON) 325 (65 Fe) MG TABS Take 1 tablet by mouth daily.  . folic acid (FOLVITE) 300 MCG tablet Take 800 mcg by mouth daily.  . nortriptyline (PAMELOR) 25 MG capsule Take 1 capsule (25 mg total) by mouth at bedtime.  . Omega-3 Fatty Acids (OMEGA-3 FISH OIL PO) Take 1 capsule by mouth daily. Contains omega 3 450 mg and fish oil 1500 mg  . Soy Isoflavone 750 MG CAPS Take 1 capsule by mouth daily.  . TURMERIC PO Take 800 mg by mouth.  . vitamin A 8000 UNIT capsule Take 8,000 Units by mouth daily.  Marland Kitchen VITAMIN E-1000 PO Take 1 capsule by mouth daily.  . Vitamins-Lipotropics (BALANCED B-150 COMPLEX TR PO) Take 1 tablet by mouth daily. Contains b1 150mg , b2 150mg , b6 150mg , b12 150mg , niacin 150 mg    Allergies:  Allergies  Allergen Reactions  . Codeine Nausea Only     Review of Systems: Review of Systems: General:   No F/C, wt loss Pulm:   No DIB, SOB, pleuritic chest pain Card:  No CP, palpitations Abd:  No n/v/d or pain Ext:  No inc edema from baseline Psych: no SI/ HI    Objective:   Blood pressure 138/82, pulse 91, temperature 97.8 F (36.6 C), height 5\' 4"  (1.626 m), weight 118 lb 11.2 oz (53.8 kg), SpO2 100 %. Body mass index is 20.37 kg/m. General:  Well Developed, well nourished, appropriate for stated age.  Neuro:  Alert and oriented,  extra-ocular muscles intact  HEENT:  Normocephalic, atraumatic, neck supple, no carotid bruits appreciated  Skin:  no gross rash, warm, pink. Cardiac:  RRR, S1  S2 Respiratory:  ECTA B/L and A/P, Not using accessory muscles, speaking in full sentences- unlabored. Vascular:  Ext warm, no cyanosis apprec.; cap RF less 2 sec. Psych:  No HI/SI, judgement and insight good, Euthymic mood. Full Affect.

## 2018-07-01 NOTE — Patient Instructions (Addendum)
Goal blood pressure is less than 140/90 on a regular basis. -Please check your blood pressure at least every other day and at different times a day.  See below for details on how to check it at home.  -Please get in touch with [place] and let us know in 1 month how your blood pressures are going at home because if they are 140/90 or more on a regular basis I recommend medications to lower your risk for cardiovascular events in the future.     Hypertension Hypertension, commonly called high blood pressure, is when the force of blood pumping through the arteries is too strong. The arteries are the blood vessels that carry blood from the heart throughout the body. Hypertension forces the heart to work harder to pump blood and may cause arteries to become narrow or stiff. Having untreated or uncontrolled hypertension can cause heart attacks, strokes, kidney disease, and other problems. A blood pressure reading consists of a higher number over a lower number. Ideally, your blood pressure should be below 120/80. The first ("top") number is called the systolic pressure. It is a measure of the pressure in your arteries as your heart beats. The second ("bottom") number is called the diastolic pressure. It is a measure of the pressure in your arteries as the heart relaxes. What are the causes? The cause of this condition is not known. What increases the risk? Some risk factors for high blood pressure are under your control. Others are not. Factors you can change  Smoking.  Having type 2 diabetes mellitus, high cholesterol, or both.  Not getting enough exercise or physical activity.  Being overweight.  Having too much fat, sugar, calories, or salt (sodium) in your diet.  Drinking too much alcohol. Factors that are difficult or impossible to change  Having chronic kidney disease.  Having a family history of high blood pressure.  Age. Risk increases with age.  Race. You may be at higher risk if you  are African-American.  Gender. Men are at higher risk than women before age 58. After age 64, women are at higher risk than men.  Having obstructive sleep apnea.  Stress. What are the signs or symptoms? Extremely high blood pressure (hypertensive crisis) may cause:  Headache.  Anxiety.  Shortness of breath.  Nosebleed.  Nausea and vomiting.  Severe chest pain.  Jerky movements you cannot control (seizures).  How is this diagnosed? This condition is diagnosed by measuring your blood pressure while you are seated, with your arm resting on a surface. The cuff of the blood pressure monitor will be placed directly against the skin of your upper arm at the level of your heart. It should be measured at least twice using the same arm. Certain conditions can cause a difference in blood pressure between your right and left arms. Certain factors can cause blood pressure readings to be lower or higher than normal (elevated) for a short period of time:  When your blood pressure is higher when you are in a health care provider's office than when you are at home, this is called white coat hypertension. Most people with this condition do not need medicines.  When your blood pressure is higher at home than when you are in a health care provider's office, this is called masked hypertension. Most people with this condition may need medicines to control blood pressure.  If you have a high blood pressure reading during one visit or you have normal blood pressure with other risk factors:  You may be asked to return on a different day to have your blood pressure checked again.  You may be asked to monitor your blood pressure at home for 1 week or longer.  If you are diagnosed with hypertension, you may have other blood or imaging tests to help your health care provider understand your overall risk for other conditions. How is this treated? This condition is treated by making healthy lifestyle  changes, such as eating healthy foods, exercising more, and reducing your alcohol intake. Your health care provider may prescribe medicine if lifestyle changes are not enough to get your blood pressure under control, and if:  Your systolic blood pressure is above 130.  Your diastolic blood pressure is above 80.  Your personal target blood pressure may vary depending on your medical conditions, your age, and other factors. Follow these instructions at home: Eating and drinking  Eat a diet that is high in fiber and potassium, and low in sodium, added sugar, and fat. An example eating plan is called the DASH (Dietary Approaches to Stop Hypertension) diet. To eat this way: ? Eat plenty of fresh fruits and vegetables. Try to fill half of your plate at each meal with fruits and vegetables. ? Eat whole grains, such as whole wheat pasta, brown rice, or whole grain bread. Fill about one quarter of your plate with whole grains. ? Eat or drink low-fat dairy products, such as skim milk or low-fat yogurt. ? Avoid fatty cuts of meat, processed or cured meats, and poultry with skin. Fill about one quarter of your plate with lean proteins, such as fish, chicken without skin, beans, eggs, and tofu. ? Avoid premade and processed foods. These tend to be higher in sodium, added sugar, and fat.  Reduce your daily sodium intake. Most people with hypertension should eat less than 1,500 mg of sodium a day.  Limit alcohol intake to no more than 1 drink a day for nonpregnant women and 2 drinks a day for men. One drink equals 12 oz of beer, 5 oz of wine, or 1 oz of hard liquor. Lifestyle  Work with your health care provider to maintain a healthy body weight or to lose weight. Ask what an ideal weight is for you.  Get at least 30 minutes of exercise that causes your heart to beat faster (aerobic exercise) most days of the week. Activities may include walking, swimming, or biking.  Include exercise to strengthen your  muscles (resistance exercise), such as pilates or lifting weights, as part of your weekly exercise routine. Try to do these types of exercises for 30 minutes at least 3 days a week.  Do not use any products that contain nicotine or tobacco, such as cigarettes and e-cigarettes. If you need help quitting, ask your health care provider.  Monitor your blood pressure at home as told by your health care provider.  Keep all follow-up visits as told by your health care provider. This is important. Medicines  Take over-the-counter and prescription medicines only as told by your health care provider. Follow directions carefully. Blood pressure medicines must be taken as prescribed.  Do not skip doses of blood pressure medicine. Doing this puts you at risk for problems and can make the medicine less effective.  Ask your health care provider about side effects or reactions to medicines that you should watch for. Contact a health care provider if:  You think you are having a reaction to a medicine you are taking.  You have  headaches that keep coming back (recurring).  You feel dizzy.  You have swelling in your ankles.  You have trouble with your vision. Get help right away if:  You develop a severe headache or confusion.  You have unusual weakness or numbness.  You feel faint.  You have severe pain in your chest or abdomen.  You vomit repeatedly.  You have trouble breathing. Summary  Hypertension is when the force of blood pumping through your arteries is too strong. If this condition is not controlled, it may put you at risk for serious complications.  Your personal target blood pressure may vary depending on your medical conditions, your age, and other factors. For most people, a normal blood pressure is less than 120/80.  Hypertension is treated with lifestyle changes, medicines, or a combination of both. Lifestyle changes include weight loss, eating a healthy, low-sodium diet,  exercising more, and limiting alcohol. This information is not intended to replace advice given to you by your health care provider. Make sure you discuss any questions you have with your health care provider. Document Released: 07/15/2005 Document Revised: 06/12/2016 Document Reviewed: 06/12/2016 Elsevier Interactive Patient Education  2018 [employer].    How to Take Your Blood Pressure   Blood pressure is a measurement of how strongly your blood is pressing against the walls of your arteries. Arteries are blood vessels that carry blood from your heart throughout your body. Your health care provider takes your blood pressure at each office visit. You can also take your own blood pressure at home with a blood pressure machine. You may need to take your own blood pressure:  To confirm a diagnosis of high blood pressure (hypertension).  To monitor your blood pressure over time.  To make sure your blood pressure medicine is working.  Supplies needed: To take your blood pressure, you will need a blood pressure machine. You can buy a blood pressure machine, or blood pressure monitor, at most drugstores or online. There are several types of home blood pressure monitors. When choosing one, consider the following:  Choose a monitor that has an arm cuff.  Choose a monitor that wraps snugly around your upper arm. You should be able to fit only one finger between your arm and the cuff.  Do not choose a monitor that measures your blood pressure from your wrist or finger.  Your health care provider can suggest a reliable monitor that will meet your needs. How to prepare To get the most accurate reading, avoid the following for 30 minutes before you check your blood pressure:  Drinking caffeine.  Drinking alcohol.  Eating.  Smoking.  Exercising.  Five minutes before you check your blood pressure:  Empty your bladder.  Sit quietly without talking in a dining chair, rather than in a  soft couch or armchair.  How to take your blood pressure To check your blood pressure, follow the instructions in the manual that came with your blood pressure monitor. If you have a digital blood pressure monitor, the instructions may be as follows: 1. Sit up straight. 2. Place your feet on the floor. Do not cross your ankles or legs. 3. Rest your left arm at the level of your heart on a table or desk or on the arm of a chair. 4. Pull up your shirt sleeve. 5. Wrap the blood pressure cuff around the upper part of your left arm, 1 inch (2.5 cm) above your elbow. It is best to wrap the cuff around bare  skin. 6. Fit the cuff snugly around your arm. You should be able to place only one finger between the cuff and your arm. 7. Position the cord inside the groove of your elbow. 8. Press the power button. 9. Sit quietly while the cuff inflates and deflates. 10. Read the digital reading on the monitor screen and write it down (record it). 11. Wait 2-3 minutes, then repeat the steps, starting at step 1.  What does my blood pressure reading mean? A blood pressure reading consists of a higher number over a lower number. Ideally, your blood pressure should be below 120/80. The first ("top") number is called the systolic pressure. It is a measure of the pressure in your arteries as your heart beats. The second ("bottom") number is called the diastolic pressure. It is a measure of the pressure in your arteries as the heart relaxes. Blood pressure is classified into four stages. The following are the stages for adults who do not have a short-term serious illness or a chronic condition. Systolic pressure and diastolic pressure are measured in a unit called mm Hg. Normal  Systolic pressure: below 981.  Diastolic pressure: below 80. Elevated  Systolic pressure: 191-478.  Diastolic pressure: below 80. Hypertension stage 1  Systolic pressure: 295-621.  Diastolic pressure: 30-86. Hypertension stage  2  Systolic pressure: 578 or above.  Diastolic pressure: 90 or above. You can have prehypertension or hypertension even if only the systolic or only the diastolic number in your reading is higher than normal. Follow these instructions at home:  Check your blood pressure as often as recommended by your health care provider.  Take your monitor to the next appointment with your health care provider to make sure: ? That you are using it correctly. ? That it provides accurate readings.  Be sure you understand what your goal blood pressure numbers are.  Tell your health care provider if you are having any side effects from blood pressure medicine. Contact a health care provider if:  Your blood pressure is consistently high. Get help right away if:  Your systolic blood pressure is higher than 180.  Your diastolic blood pressure is higher than 110. This information is not intended to replace advice given to you by your health care provider. Make sure you discuss any questions you have with your health care provider. Document Released: 12/22/2015 Document Revised: 03/05/2016 Document Reviewed: 12/22/2015 Elsevier Interactive Patient Education  [employer].

## 2018-09-01 ENCOUNTER — Telehealth: Payer: Self-pay | Admitting: Family Medicine

## 2018-09-01 ENCOUNTER — Other Ambulatory Visit: Payer: Self-pay

## 2018-09-01 DIAGNOSIS — F39 Unspecified mood [affective] disorder: Secondary | ICD-10-CM

## 2018-09-01 DIAGNOSIS — G4700 Insomnia, unspecified: Secondary | ICD-10-CM

## 2018-09-01 MED ORDER — NORTRIPTYLINE HCL 25 MG PO CAPS: 25.0000 mg | ORAL_CAPSULE | Freq: Every day | ORAL | 1 refills | Status: DC

## 2018-09-01 MED ORDER — BUSPIRONE HCL 15 MG PO TABS: 5.0000 mg | ORAL_TABLET | Freq: Three times a day (TID) | ORAL | 1 refills | Status: DC

## 2018-09-01 NOTE — Telephone Encounter (Signed)
Patient called to request refill on these (2) medications :   1)---- nortriptyline (PAMELOR) 25 MG capsule [[id number]]   Order Details  Dose: 25 mg Route: Oral Frequency: Daily at bedtime  Dispense Quantity: 90 capsule Refills: 1 Fills remaining: --        Sig: Take 1 capsule (25 mg total) by mouth at bedtime.     &  2)----busPIRone (BUSPAR) 15 MG tablet [[id number]]   Order Details  Dose: 7.5 mg Route: Oral Frequency: 3 times daily  Indications of Use: Anxiety Disorder, Major Depressive Disorder  Dispense Quantity: 90 tablet Refills: 1 Fills remaining: --        Sig: Take 0.5 tablets (7.5 mg total) by mouth 3 (three) times daily.          --Forwarding request to medical assistant that if approved send to :  [place], [place] [phone] (Phone) [phone] (Fax)   --glh

## 2018-09-01 NOTE — Telephone Encounter (Signed)
Refills sent into the pharmacy. MPulliam, CMA/RT(R)

## 2018-09-12 DIAGNOSIS — H2513 Age-related nuclear cataract, bilateral: Secondary | ICD-10-CM | POA: Diagnosis not present

## 2018-09-12 DIAGNOSIS — Z0100 Encounter for examination of eyes and vision without abnormal findings: Secondary | ICD-10-CM | POA: Diagnosis not present

## 2018-10-28 ENCOUNTER — Other Ambulatory Visit (INDEPENDENT_AMBULATORY_CARE_PROVIDER_SITE_OTHER): Payer: Medicare Other

## 2018-10-28 ENCOUNTER — Other Ambulatory Visit: Payer: Self-pay

## 2018-10-28 DIAGNOSIS — R030 Elevated blood-pressure reading, without diagnosis of hypertension: Secondary | ICD-10-CM

## 2018-10-28 DIAGNOSIS — E785 Hyperlipidemia, unspecified: Secondary | ICD-10-CM

## 2018-10-28 DIAGNOSIS — E7800 Pure hypercholesterolemia, unspecified: Secondary | ICD-10-CM

## 2018-10-29 ENCOUNTER — Other Ambulatory Visit: Payer: Medicare Other

## 2018-10-29 LAB — COMPREHENSIVE METABOLIC PANEL
ALT: 18 IU/L (ref 0–32)
AST: 22 IU/L (ref 0–40)
Albumin/Globulin Ratio: 2.2 (ref 1.2–2.2)
Albumin: 4.7 g/dL (ref 3.7–4.7)
Alkaline Phosphatase: 73 IU/L (ref 39–117)
BUN/Creatinine Ratio: 13 (ref 12–28)
BUN: 10 mg/dL (ref 8–27)
Bilirubin Total: 0.4 mg/dL (ref 0.0–1.2)
CO2: 25 mmol/L (ref 20–29)
Calcium: 9.7 mg/dL (ref 8.7–10.3)
Chloride: 97 mmol/L (ref 96–106)
Creatinine, Ser: 0.78 mg/dL (ref 0.57–1.00)
GFR calc Af Amer: 88 mL/min/{1.73_m2} (ref >59)
GFR calc non Af Amer: 77 mL/min/{1.73_m2} (ref >59)
Globulin, Total: 2.1 g/dL (ref 1.5–4.5)
Glucose: 85 mg/dL (ref 65–99)
Potassium: 5.5 mmol/L — ABNORMAL HIGH (ref 3.5–5.2)
Sodium: 137 mmol/L (ref 134–144)
Total Protein: 6.8 g/dL (ref 6.0–8.5)

## 2018-10-29 LAB — LIPID PANEL
Chol/HDL Ratio: 1.8 ratio (ref 0.0–4.4)
Cholesterol, Total: 175 mg/dL (ref 100–199)
HDL: 95 mg/dL (ref >39)
LDL Calculated: 70 mg/dL (ref 0–99)
Triglycerides: 49 mg/dL (ref 0–149)
VLDL Cholesterol Cal: 10 mg/dL (ref 5–40)

## 2018-10-30 ENCOUNTER — Other Ambulatory Visit: Payer: Medicare Other

## 2018-11-04 ENCOUNTER — Other Ambulatory Visit: Payer: Self-pay

## 2018-11-04 ENCOUNTER — Encounter: Payer: Self-pay | Admitting: Family Medicine

## 2018-11-04 ENCOUNTER — Ambulatory Visit (INDEPENDENT_AMBULATORY_CARE_PROVIDER_SITE_OTHER): Payer: Medicare Other | Admitting: Family Medicine

## 2018-11-04 VITALS — BP 165/96 | HR 93 | Temp 97.5°F | Ht 64.0 in | Wt 112.9 lb

## 2018-11-04 DIAGNOSIS — Z719 Counseling, unspecified: Secondary | ICD-10-CM

## 2018-11-04 DIAGNOSIS — E785 Hyperlipidemia, unspecified: Secondary | ICD-10-CM | POA: Diagnosis not present

## 2018-11-04 DIAGNOSIS — I10 Essential (primary) hypertension: Secondary | ICD-10-CM

## 2018-11-04 DIAGNOSIS — F39 Unspecified mood [affective] disorder: Secondary | ICD-10-CM

## 2018-11-04 DIAGNOSIS — F411 Generalized anxiety disorder: Secondary | ICD-10-CM

## 2018-11-04 MED ORDER — LOSARTAN POTASSIUM-HCTZ 50-12.5 MG PO TABS: 0.5000 | ORAL_TABLET | Freq: Every day | ORAL | 0 refills | Status: DC

## 2018-11-04 NOTE — Progress Notes (Signed)
Virtual Visit via Telephone Note for [employer], D.O- Primary Care Physician at [facility]   I connected with current patient today by telephone and verified that I am speaking with the correct person using two identifiers.   Because of federal recommendations of social distancing due to the current novel COVID-19 outbreak, an audio/video telehealth visit is felt to be most appropriate for this patient at this time.  My staff members also discussed with the patient that there may be a patient charge related to this service.   The patient expressed understanding, and agreed to proceed.    History of Present Illness:  - started on chol meds and changed mood meds last OV.   Started Pamelor at night for sleep ands GAD   GAD -  Increased dose of buspar last OV.  Went from 5 TID to 7.5 TID.   Tol well.   For stress mgt- walks 6 d/wk for 1 hr  ---> pt declines need for inc in dose.  She will let me know if needs inc in future.    Insomnia -  Can go back to sleep easier now, feels more rested. Goes to bed late-- 11 or 12.   - told to use youtube for sleep meditation     BP:  has been checking it- 150's- 160's/ 90's in the AMs- highest in mornings;  In evenings- less on average  No vis changes, no ha, dizziness etc  - tried HCTZ in the past- no s-e; tol well.     - started BP meds today - look at [place] for details on how to check BP and how to lower   CHol: started on med-  - drinking 64 oz water daily or more    --> still working 3 days per week - takes temp daily. Temporal was      Recent Results (from the past 2160 hour(s))  Comprehensive metabolic panel     Status: Abnormal   Collection Time: 10/28/18 10:01 AM  Result Value Ref Range   Glucose 85 65 - 99 mg/dL   BUN 10 8 - 27 mg/dL   Creatinine, Ser 0.78 0.57 - 1.00 mg/dL   GFR calc non Af Amer 77 >59 mL/min/1.73   GFR calc Af Amer 88 >59 mL/min/1.73   BUN/Creatinine Ratio 13 12 - 28   Sodium 137  134 - 144 mmol/L   Potassium 5.5 (H) 3.5 - 5.2 mmol/L   Chloride 97 96 - 106 mmol/L   CO2 25 20 - 29 mmol/L   Calcium 9.7 8.7 - 10.3 mg/dL   Total Protein 6.8 6.0 - 8.5 g/dL   Albumin 4.7 3.7 - 4.7 g/dL   Globulin, Total 2.1 1.5 - 4.5 g/dL   Albumin/Globulin Ratio 2.2 1.2 - 2.2   Bilirubin Total 0.4 0.0 - 1.2 mg/dL   Alkaline Phosphatase 73 39 - 117 IU/L   AST 22 0 - 40 IU/L   ALT 18 0 - 32 IU/L  Lipid panel     Status: None   Collection Time: 10/28/18 10:01 AM  Result Value Ref Range   Cholesterol, Total 175 100 - 199 mg/dL   Triglycerides 49 0 - 149 mg/dL   HDL 95 >39 mg/dL   VLDL Cholesterol Cal 10 5 - 40 mg/dL   LDL Calculated 70 0 - 99 mg/dL   Chol/HDL Ratio 1.8 0.0 - 4.4 ratio    Comment:  T. Chol/HDL Ratio                                             Men  Women                               1/2 Avg.Risk  3.4    3.3                                   Avg.Risk  5.0    4.4                                2X Avg.Risk  9.6    7.1                                3X Avg.Risk 23.4   11.0       Wt Readings from Last 3 Encounters:  11/04/18 112 lb 14.4 oz (51.2 kg)  07/01/18 118 lb 11.2 oz (53.8 kg)  03/25/18 117 lb (53.1 kg)    BP Readings from Last 3 Encounters:  11/04/18 (!) 165/96  07/01/18 138/82  03/25/18 136/84    Pulse Readings from Last 3 Encounters:  11/04/18 93  07/01/18 91  03/25/18 84    BMI Readings from Last 3 Encounters:  11/04/18 19.38 kg/m  07/01/18 20.37 kg/m  03/25/18 20.08 kg/m      -Vitals obtained; Medications, allergies reconciled;  personal medical, social, Sx etc. etc. histories were updated by Lanier Prude the medical assistant today and are reflected in below chart   Patient Care Team    Relationship Specialty Notifications Start End  Mellody Dance, DO PCP - General Family Medicine  02/18/18      Patient Active Problem List   Diagnosis Date Noted  . Elevated blood-pressure reading  without diagnosis of hypertension-may be anxiety mediated 07/01/2018  . Insomnia 03/25/2018  . Elevated LDL cholesterol level 03/25/2018  . Hyperlipidemia 03/25/2018  . H/O: HTN (hypertension) 02/18/2018  . Mood disorder ([place]) 02/18/2018  . Generalized anxiety disorder 03/19/2013     Current Meds  Medication Sig  . Apple Cider Vinegar 600 MG CAPS Take 1 capsule by mouth daily.  . Ascorbic Acid (VITAMIN C CR) 1500 MG TBCR Take 1 tablet by mouth daily.  Marland Kitchen atorvastatin (LIPITOR) 20 MG tablet Take 0.5 tablets (10 mg total) by mouth at bedtime.  . B Complex-Folic Acid (BALANCED W-102 PO) Take by mouth.  . busPIRone (BUSPAR) 15 MG tablet Take 0.5 tablets (7.5 mg total) by mouth 3 (three) times daily.  Marland Kitchen CALCIUM-MAGNESUIUM-ZINC 333-133-8.3 MG TABS Take 4 tablets by mouth daily.  . Cholecalciferol (VITAMIN D3) 5000 units CAPS Take 1 capsule by mouth daily.  . Cyanocobalamin (B-12) 1000 MCG CAPS Take 1 capsule by mouth.  . Ferrous Sulfate (IRON) 325 (65 Fe) MG TABS Take 1 tablet by mouth daily.  . nortriptyline (PAMELOR) 25 MG capsule Take 1 capsule (25 mg total) by mouth at bedtime.  . Omega-3 Fatty Acids (OMEGA-3 FISH OIL PO) Take 1 capsule by mouth daily. Contains omega 3 450 mg and fish oil 1500 mg  . Soy  Isoflavone 750 MG CAPS Take 1 capsule by mouth daily.  . TURMERIC PO Take 800 mg by mouth.  . vitamin A 8000 UNIT capsule Take 8,000 Units by mouth daily.  Marland Kitchen VITAMIN E-1000 PO Take 1 capsule by mouth daily.  . Vitamins-Lipotropics (BALANCED B-150 COMPLEX TR PO) Take 1 tablet by mouth daily. Contains b1 150mg , b2 150mg , b6 150mg , b12 150mg , niacin 150 mg     Allergies:  Allergies  Allergen Reactions  . Codeine Nausea Only     ROS:  See above HPI for pertinent positives and negatives   Objective:   Blood pressure (!) 165/96, pulse 93, temperature (!) 97.5 F (36.4 C), temperature source Tympanic, height 5\' 4"  (1.626 m), weight 112 lb 14.4 oz (51.2 kg). (if some vitals are  omitted, this means that patient was UNABLE to obtain them even though asked to get them prior to OV today) General: sounds in no acute distress.  Skin: Pt confirms warm and dry  extremities and pink fingertips Respiratory: speaking in full sentences, no conversational dyspnea Psych: A and O *3, appears insight good, mood- full      Impression and Recommendations:    1. Hyperlipidemia, unspecified hyperlipidemia type -Repeat CMP and fasting lipid profile reviewed with patient today.  Both look great.  LDL, triglycerides have decreased while HDL has significantly gone up. -Continue current medications at current dose. -Continue with dietary lifestyle modification  2. Hypertension, goal below 150/90 Patient's blood pressure has not been at goal in the 150s -500B at home systolically. -Benefits of meds discussed with patient and we will start medication at this time. --Diet discussed as well as dietary lifestyle modifications and told patient to go to [place] for handouts on how to check blood pressure, how to control blood pressure etc. -Continue to exercise for an hour 6 days a week. -start losartan-hydrochlorothiazide (HYZAAR) 50-12.5 MG tablet; Take 0.5 tablets by mouth daily.  Dispense: 45 tablet; Refill: 0  3. 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 -Per patient she does not want to increase BuSpar to 10 mg 3 times daily at this time.  She would rather wait until this prescription is near ending to see how she is feeling and then we will modify if needed then. -She will call us and let us know  4. Generalized anxiety disorder With Sleep d/o  Well-controlled current regimen.  Continue meds of Pamelor to help with anxiety and sleep.  5. Health education/counseling - Novel Covid -19 counseling done; all questions were answered.   - Current CDC and federal guidelines reviewed with  patient  - Reminded pt of extreme importance of social distancing; minimizing contacts with others, avoiding ALL but emergency appts etc. - Told patient to be prepared, not scared; and be smart for the sake of others - told to call with any concerns  --> Told patient we will give her a excuse for jury duty as she is due to do it in the very near future.  Sent a message to my medical assistants today asking to create that note, I will sign today and patient would like to pick up by the end of the day today.  Told patient should be no problem due to current state of affairs at the clinic and in the community  As part of my medical decision making, I reviewed the following data within the [place] History obtained from pt/family, Nursing  notes reviewed and incorporated, Labs reviewed, Old chart reviewed, Radiograph/ tests reviewed if applicable and OV notes from prior OV's with me as well as other specialists he has seen since seeing me last were all reviewed and used in my medical decision making process today.  I discussed the assessment and treatment plan with the patient. The patient was provided an opportunity to ask questions and all were answered.  The patient agreed with the plan and demonstrated an understanding of the instructions.   No barriers to understanding were identified.  Red flag symptoms and signs discussed in detail.  Patient expressed understanding regarding what to do in case of emergency\urgent symptoms   The patient was advised to call back or seek an in-person evaluation if the symptoms worsen or if the condition fails to improve as anticipated.   Return for 6wks- notify us on how BP is doing at home- started new med, OV in 61mo.     Meds ordered this encounter  Medications  . losartan-hydrochlorothiazide (HYZAAR) 50-12.5 MG tablet    Sig: Take 0.5 tablets by mouth daily.    Dispense:  45 tablet    Refill:  0    **Gross side effects, risk and  benefits, and alternatives of medications and treatment plan in general discussed with patient.  Patient is aware that all medications have potential side effects and we are unable to predict every side effect or drug-drug interaction that may occur.   Patient was strongly encouraged to call with any questions or concerns they may have concerns.     I provided 37+ minutes of  non-face-to-face time during this encounter.  (Patient is very fearful of various medications and she had several questions about that she really need to go on the med, what possible side effects there are, we discussed what symptoms hypertension can cause-nothing in many cases etc. Etc. extensive counseling done and all patient's concerns were addressed)   Mellody Dance, DO

## 2018-11-07 ENCOUNTER — Other Ambulatory Visit: Payer: Self-pay | Admitting: Family Medicine

## 2018-11-07 DIAGNOSIS — F39 Unspecified mood [affective] disorder: Secondary | ICD-10-CM

## 2018-11-07 DIAGNOSIS — G4700 Insomnia, unspecified: Secondary | ICD-10-CM

## 2018-11-07 DIAGNOSIS — I10 Essential (primary) hypertension: Secondary | ICD-10-CM

## 2018-11-09 ENCOUNTER — Other Ambulatory Visit: Payer: Self-pay

## 2018-11-09 DIAGNOSIS — F39 Unspecified mood [affective] disorder: Secondary | ICD-10-CM

## 2018-11-09 MED ORDER — BUSPIRONE HCL 15 MG PO TABS: 7.5000 mg | ORAL_TABLET | Freq: Three times a day (TID) | ORAL | 1 refills | Status: DC

## 2019-01-05 ENCOUNTER — Other Ambulatory Visit: Payer: Self-pay | Admitting: Family Medicine

## 2019-01-05 DIAGNOSIS — I10 Essential (primary) hypertension: Secondary | ICD-10-CM

## 2019-01-27 ENCOUNTER — Telehealth: Payer: Self-pay | Admitting: Family Medicine

## 2019-01-27 NOTE — Telephone Encounter (Signed)
Patient is requesting a call back form the nurse. She is having bad seasonal allergies and wants to know the best OTC med to take with her current meds. Please advise

## 2019-01-27 NOTE — Telephone Encounter (Signed)
Patient is having seasonal allergies - sneezing and slight nasal congestion.  Patient would like to know what OTC allergy medication she can take with medications that she is on.  Please review and advise. MPulliam, CMA/RT(R)

## 2019-01-28 NOTE — Telephone Encounter (Signed)
Called patient and left message to call the office back. MPulliam, CMA/RT(R)  

## 2019-01-28 NOTE — Telephone Encounter (Signed)
floanse daily after sinus rinses

## 2019-02-02 NOTE — Telephone Encounter (Signed)
Patient notified. MPulliam, CMA/RT(R)  

## 2019-03-05 ENCOUNTER — Other Ambulatory Visit: Payer: Self-pay | Admitting: Family Medicine

## 2019-03-05 DIAGNOSIS — I10 Essential (primary) hypertension: Secondary | ICD-10-CM

## 2019-04-04 ENCOUNTER — Other Ambulatory Visit: Payer: Self-pay | Admitting: Family Medicine

## 2019-04-04 DIAGNOSIS — G4700 Insomnia, unspecified: Secondary | ICD-10-CM

## 2019-04-04 DIAGNOSIS — F39 Unspecified mood [affective] disorder: Secondary | ICD-10-CM

## 2019-04-05 ENCOUNTER — Telehealth: Payer: Self-pay

## 2019-04-05 NOTE — Telephone Encounter (Signed)
Please call pt to schedule OV.  No further refills until pt is seen by provider.  Charyl Bigger, CMA

## 2019-04-18 ENCOUNTER — Other Ambulatory Visit: Payer: Self-pay | Admitting: Family Medicine

## 2019-04-18 DIAGNOSIS — I10 Essential (primary) hypertension: Secondary | ICD-10-CM

## 2019-04-21 ENCOUNTER — Telehealth: Payer: Self-pay | Admitting: Family Medicine

## 2019-04-21 NOTE — Telephone Encounter (Signed)
Called patient to set up provider required Appt for Rx refills---No answer at [facility] # left msg for Pt to call office.  FYI to medical asst.  --glh

## 2019-05-05 ENCOUNTER — Other Ambulatory Visit: Payer: Self-pay

## 2019-05-05 ENCOUNTER — Ambulatory Visit (INDEPENDENT_AMBULATORY_CARE_PROVIDER_SITE_OTHER): Payer: Medicare Other | Admitting: Family Medicine

## 2019-05-05 ENCOUNTER — Encounter: Payer: Self-pay | Admitting: Family Medicine

## 2019-05-05 VITALS — BP 152/86 | HR 91 | Ht 64.0 in | Wt 111.0 lb

## 2019-05-05 DIAGNOSIS — E785 Hyperlipidemia, unspecified: Secondary | ICD-10-CM | POA: Diagnosis not present

## 2019-05-05 DIAGNOSIS — F39 Unspecified mood [affective] disorder: Secondary | ICD-10-CM

## 2019-05-05 DIAGNOSIS — G4700 Insomnia, unspecified: Secondary | ICD-10-CM

## 2019-05-05 DIAGNOSIS — I10 Essential (primary) hypertension: Secondary | ICD-10-CM | POA: Diagnosis not present

## 2019-05-05 DIAGNOSIS — Z9119 Patient's noncompliance with other medical treatment and regimen: Secondary | ICD-10-CM | POA: Diagnosis not present

## 2019-05-05 DIAGNOSIS — Z91199 Patient's noncompliance with other medical treatment and regimen due to unspecified reason: Secondary | ICD-10-CM

## 2019-05-05 MED ORDER — ATORVASTATIN CALCIUM 10 MG PO TABS: 10.0000 mg | ORAL_TABLET | Freq: Every day | ORAL | 1 refills | Status: DC

## 2019-05-05 MED ORDER — NORTRIPTYLINE HCL 25 MG PO CAPS: 25.0000 mg | ORAL_CAPSULE | Freq: Every day | ORAL | 1 refills | Status: DC

## 2019-05-05 MED ORDER — LOSARTAN POTASSIUM-HCTZ 100-25 MG PO TABS: 1.0000 | ORAL_TABLET | Freq: Every day | ORAL | 0 refills | Status: DC

## 2019-05-05 MED ORDER — BUSPIRONE HCL 7.5 MG PO TABS: 7.5000 mg | ORAL_TABLET | Freq: Three times a day (TID) | ORAL | 1 refills | Status: DC

## 2019-05-05 NOTE — Progress Notes (Signed)
Telehealth office visit note for Michele Tyler, D.O- at [facility]   I connected with current patient today and verified that I am speaking with the correct person using two identifiers.   . Location of the patient: Home . Location of the provider: Office Only the patient (+/- their family members at pt's discretion) and myself were participating in the encounter - This visit type was conducted due to national recommendations for restrictions regarding the COVID-19 Pandemic (e.g. social distancing) in an effort to limit this patient's exposure and mitigate transmission in our community.  This format is felt to be most appropriate for this patient at this time.   - The patient did not have access to video technology or had technical difficulties with video requiring transitioning to audio format only. - No physical exam could be performed with this format, beyond that communicated to [place] by the patient/ family members as noted.   - Additionally my office staff/ schedulers discussed with the patient that there may be a monetary charge related to this service, depending on their medical insurance.   The patient expressed understanding, and agreed to proceed.       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  Mood Feels her mood is "probably about the same I guess."  Says "I think the extra 7.5 mg per day helps."  Notes she's taking a half tablet of Buspar three times per day- NOT AS WRITTEN.   Insomnia Continues Pamelor at night.  Notes goes to sleep well, but sometimes she wakes up periodically during the night.  "Sometimes I wake up and feel anxious so I just get on up, but I have to to work anyway so it's fine."   HLD - Not taking meds as prescribed. - Only taking 1/2 tab nitely - pt denies s-e or intolerance. Just "deosn't like to take medications".     HPI:  Hypertension: -  Her blood pressure at  home has been running around 155-160/80-85 at home.  States she checks her blood pressure "not often enough."  Remarks that she often forgets about checking her blood pressure. - Patient reports that she's only taking 1/4 of a pill of her blood pressure medicine.  States tolerating her BP meds fine; "it seems to make me feel a little sluggish at times, maybe." She isn't sure if the sluggish feeling is due to the medicine, "who knows about these things."  - Her denies acute concerns or problems related to treatment plan  - She denies new onset of: chest pain, exercise intolerance, shortness of breath, dizziness, visual changes, headache, lower extremity swelling or claudication.   Last 3 blood pressure readings in our office are as follows: BP Readings from Last 3 Encounters:  05/05/19 (!) 152/86  11/04/18 (!) 165/96  07/01/18 138/82   Filed Weights   05/05/19 1343  Weight: 111 lb (50.3 kg)    GAD 7 : Generalized Anxiety Score 07/01/2018 03/25/2018 03/25/2018  Nervous, Anxious, on Edge 3 2 3   Control/stop worrying 3 1 3   Worry too much - different things 3 1 3   Trouble relaxing 3 1 2   Restless 2 1 1   Easily annoyed or irritable 2 1 2   Afraid - awful might happen 3 0 2  Total GAD 7 Score 19 7 16   Anxiety Difficulty Very difficult - -    Depression screen PHQ  2/9 11/04/2018 07/01/2018 03/25/2018 02/18/2018  Decreased Interest 0 2 3 -  Down, Depressed, Hopeless 0 3 3 3   PHQ - 2 Score 0 5 6 3   Altered sleeping 0 1 0 0  Tired, decreased energy 0 1 0 0  Change in appetite 0 0 0 0  Feeling bad or failure about yourself  0 2 2 2   Trouble concentrating 0 0 1 0  Moving slowly or fidgety/restless 0 0 0 0  Suicidal thoughts 0 0 0 0  PHQ-9 Score 0 9 9 5   Difficult doing work/chores Not difficult at all Somewhat difficult Somewhat difficult -      Impression and Recommendations:    1. Hypertension, goal below 150/90   2. Hyperlipidemia, unspecified hyperlipidemia type   3. 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 Insomnia, unspecified type   5. Personal history of noncompliance with medical treatment, presenting hazards to health       Mood Disorder; Mixed Anxiety & Depression - Managed on 7.5 mg of Buspar. - Per pt mood is stable on current management.    - No changes made to treatment plan today.  See med list. - Continue as prescribed.  - Reviewed the "spokes of the wheel" of mood and health management.  Stressed the importance of ongoing prudent habits, including regular exercise, appropriate sleep hygiene, healthful dietary habits, and prayer/meditation to relax.  - Will continue to monitor.    Insomnia - Managed on Pamelor. - Patient declines increasing dose at this time.  - No changes made to treatment plan today.  See med list. - Continue as prescribed.  - Will continue to monitor.    Hyperlipidemia - Managed on half-tablet of Lipitor nightly. - Patient tolerating medication well without S-E. - No changes made to treatment plan today. - Continue as prescribed.  - Fasting lipid panel last checked six months ago. Triglycerides = 49, down from 76 prior. HDL = 95, up from 87 prior. LDL = 70 down from 108 prior.  - Will continue to monitor and re-check as recommended.    Hypertension - Goal Below 150/90 - BP in office last visit elevated at 165/96. - Started Losartan-HCTZ in early April 2020. - Patient told to return in 4 months and update BP in 6 weeks and was lost to follow-up.  - BP remains elevated today at 152/86, not at goal. - Per patient, has only been taking quarter-of-a-tablet of BP medication- on her own accord.  - Dose of BP medication increased today.  See med list. - Extensively reviewed appropriate dosage of BP medication.  - Advised patient to start with a half-tablet daily of new dose. - Take the half-tablet for two weeks and check BP daily. - If BP is  running under 140/90 on a regular basis, patient may stay at 1/2 dose. - If BP is not at goal after two weeks, increase to full tablet.  - Recommended checking back in about BP in 4-6 weeks. - Advised patient to call in and provide BP log to clinic.  - Counseled patient on pathophysiology of disease and discussed various treatment options, which always includes dietary and lifestyle modification as first line.   - Lifestyle changes such as dash and heart healthy diets and engaging in a regular exercise program discussed extensively with patient.   - Ambulatory blood pressure monitoring encouraged at least 3 times weekly.  Keep log and bring in every office visit.  Reminded patient that if they ever feel poorly in any way, to check their blood pressure and pulse.  - Handouts provided at patient's desire and/or told to go online at the [place] website for further information  - We will continue to monitor  [place] patient to continue working toward exercising to improve overall mental, physical, and emotional health.    - Encouraged patient to engage in daily physical activity, especially a formal exercise routine.  Recommended that the patient eventually strive for at least 150 minutes of moderate cardiovascular activity per week according to guidelines established by the [facility].   - Healthy dietary habits encouraged, including low-carb, and high amounts of lean protein in diet.   - Patient should also consume adequate amounts of water.  - Health counseling performed.  All questions answered.  Recommendations - Need for full lab work.  Last full labs drawn 03/20/2018. - Follow up in 4-6 weeks, in-person OV, for labs and other concerns.  - As part of my medical decision making, I reviewed the following data within the [place] History obtained from pt /family, CMA notes reviewed and incorporated if  applicable, Labs reviewed, Radiograph/ tests reviewed if applicable and OV notes from prior OV's with me, as well as other specialists she/he has seen since seeing me last, were all reviewed and used in my medical decision making process today.    - Additionally, discussion had with patient regarding our treatment plan, and their biases/concerns about that plan were used in my medical decision making today.    - The patient agreed with the plan and demonstrated an understanding of the instructions.   No barriers to understanding were identified.    - Red flag symptoms and signs discussed in detail.  Patient expressed understanding regarding what to do in case of emergency\ urgent symptoms.   - The patient was advised to call back or seek an in-person evaluation if the symptoms worsen or if the condition fails to improve as anticipated.   Return for 4-6 weeks, in-person OV, labs, soft tissue mass on head, re-check BP after increased meds.     Meds ordered this encounter  Medications  . DISCONTD: busPIRone (BUSPAR) 7.5 MG tablet    Sig: Take 1 tablet (7.5 mg total) by mouth 3 (three) times daily.    Dispense:  270 tablet    Refill:  1  . DISCONTD: atorvastatin (LIPITOR) 10 MG tablet    Sig: Take 1 tablet (10 mg total) by mouth at bedtime.    Dispense:  90 tablet    Refill:  1  . nortriptyline (PAMELOR) 25 MG capsule    Sig: Take 1 capsule (25 mg total) by mouth at bedtime.    Dispense:  90 capsule    Refill:  1  . losartan-hydrochlorothiazide (HYZAAR) 100-25 MG tablet    Sig: Take 1 tablet by mouth daily.    Dispense:  90 tablet    Refill:  0  . atorvastatin (LIPITOR) 10 MG tablet    Sig: Take 0.5 tablets (5 mg total) by mouth at bedtime.    Dispense:  45 tablet    Refill:  1  . busPIRone (BUSPAR) 7.5 MG tablet    Sig: 1/2 tab q 8 hrs daily    Dispense:  135 tablet    Refill:  1    Medications Discontinued During This Encounter  Medication Reason  .  losartan-hydrochlorothiazide (HYZAAR) 50-12.5 MG tablet   .  atorvastatin (LIPITOR) 20 MG tablet Reorder  . busPIRone (BUSPAR) 15 MG tablet Reorder  . nortriptyline (PAMELOR) 25 MG capsule Reorder  . busPIRone (BUSPAR) 7.5 MG tablet   . atorvastatin (LIPITOR) 10 MG tablet      I provided 21+ minutes of non face-to-face time during this encounter.  Additional time was spent with charting and coordination of care after the actual visit commenced.   Note:  This note was prepared with assistance of Dragon voice recognition software. Occasional wrong-word or sound-a-like substitutions may have occurred due to the inherent limitations of voice recognition software.   This document serves as a record of services personally performed by Michele Dance, DO. It was created on her behalf by Toni Amend, a trained medical scribe. The creation of this record is based on the scribe's personal observations and the provider's statements to them.   I have reviewed the above medical documentation for accuracy and completeness and I concur.  Michele Dance, DO 05/08/2019 4:17 PM        Patient Care Team    Relationship Specialty Notifications Start End  Michele Dance, DO PCP - General Family Medicine  02/18/18      -Vitals obtained; medications/ allergies reconciled;  personal medical, social, Sx etc.histories were updated by CMA, reviewed by me and are reflected in chart   Patient Active Problem List   Diagnosis Date Noted  . Personal history of noncompliance with medical treatment, presenting hazards to health 05/08/2019  . Elevated blood-pressure reading without diagnosis of hypertension-may be anxiety mediated 07/01/2018  . Insomnia 03/25/2018  . Elevated LDL cholesterol level 03/25/2018  . Hyperlipidemia 03/25/2018  . H/O: HTN (hypertension) 02/18/2018  . Mood disorder ([place]) 02/18/2018  . Generalized anxiety disorder 03/19/2013     Current Meds  Medication Sig  . Apple  Cider Vinegar 600 MG CAPS Take 1 capsule by mouth daily.  . Ascorbic Acid (VITAMIN C CR) 1500 MG TBCR Take 1 tablet by mouth daily.  Marland Kitchen atorvastatin (LIPITOR) 10 MG tablet Take 0.5 tablets (5 mg total) by mouth at bedtime.  . B Complex-Folic Acid (BALANCED 0000000 PO) Take by mouth.  . busPIRone (BUSPAR) 7.5 MG tablet 1/2 tab q 8 hrs daily  . CALCIUM-MAGNESUIUM-ZINC 333-133-8.3 MG TABS Take 4 tablets by mouth daily.  . Cholecalciferol (VITAMIN D3) 5000 units CAPS Take 1 capsule by mouth daily.  . Cyanocobalamin (B-12) 1000 MCG CAPS Take 1 capsule by mouth.  . Ferrous Sulfate (IRON) 325 (65 Fe) MG TABS Take 1 tablet by mouth daily.  . folic acid (FOLVITE) Q000111Q MCG tablet Take 800 mcg by mouth daily.  . nortriptyline (PAMELOR) 25 MG capsule Take 1 capsule (25 mg total) by mouth at bedtime.  . Omega-3 Fatty Acids (OMEGA-3 FISH OIL PO) Take 1 capsule by mouth daily. Contains omega 3 450 mg and fish oil 1500 mg  . Soy Isoflavone 750 MG CAPS Take 1 capsule by mouth daily.  . TURMERIC PO Take 800 mg by mouth.  . vitamin A 8000 UNIT capsule Take 8,000 Units by mouth daily.  Marland Kitchen VITAMIN E-1000 PO Take 1 capsule by mouth daily.  . Vitamins-Lipotropics (BALANCED B-150 COMPLEX TR PO) Take 1 tablet by mouth daily. Contains b1 150mg , b2 150mg , b6 150mg , b12 150mg , niacin 150 mg  . [DISCONTINUED] atorvastatin (LIPITOR) 10 MG tablet Take 1 tablet (10 mg total) by mouth at bedtime.  . [DISCONTINUED] atorvastatin (LIPITOR) 20 MG tablet Take 0.5 tablets (10 mg total) by mouth at bedtime.  . [  DISCONTINUED] busPIRone (BUSPAR) 15 MG tablet Take 0.5 tablets (7.5 mg total) by mouth 3 (three) times daily. OFFICE VISIT REQUIRED PRIOR TO ANY FURTHER REFILLS  . [DISCONTINUED] busPIRone (BUSPAR) 7.5 MG tablet Take 1 tablet (7.5 mg total) by mouth 3 (three) times daily.  . [DISCONTINUED] losartan-hydrochlorothiazide (HYZAAR) 50-12.5 MG tablet Take 0.5 tablets by mouth daily. Needs office for further refills  . [DISCONTINUED]  nortriptyline (PAMELOR) 25 MG capsule Take 1 capsule (25 mg total) by mouth at bedtime. OFFICE VISIT REQUIRED PRIOR TO ANY FURTHER REFILLS     Allergies:  Allergies  Allergen Reactions  . Codeine Nausea Only     ROS:  See above HPI for pertinent positives and negatives   Objective:   Blood pressure (!) 152/86, pulse 91, height 5\' 4"  (1.626 m), weight 111 lb (50.3 kg).  (if some vitals are omitted, this means that patient was UNABLE to obtain them even though they were asked to get them prior to OV today.  They were asked to call us at their earliest convenience with these once obtained. )  General: A & O * 3; sounds in no acute distress; in usual state of health.  Skin: Pt confirms warm and dry extremities and pink fingertips HEENT: Pt confirms lips non-cyanotic Chest: Patient confirms normal chest excursion and movement Respiratory: speaking in full sentences, no conversational dyspnea; patient confirms no use of accessory muscles Psych: insight appears good, mood- appears full

## 2019-05-08 DIAGNOSIS — Z91199 Patient's noncompliance with other medical treatment and regimen due to unspecified reason: Secondary | ICD-10-CM | POA: Insufficient documentation

## 2019-05-08 DIAGNOSIS — Z9119 Patient's noncompliance with other medical treatment and regimen: Secondary | ICD-10-CM | POA: Insufficient documentation

## 2019-05-08 MED ORDER — BUSPIRONE HCL 7.5 MG PO TABS: ORAL_TABLET | ORAL | 1 refills | Status: DC

## 2019-05-08 MED ORDER — ATORVASTATIN CALCIUM 10 MG PO TABS: 5.0000 mg | ORAL_TABLET | Freq: Every day | ORAL | 1 refills | Status: DC

## 2019-05-12 DIAGNOSIS — Z1231 Encounter for screening mammogram for malignant neoplasm of breast: Secondary | ICD-10-CM | POA: Diagnosis not present

## 2019-06-25 ENCOUNTER — Other Ambulatory Visit: Payer: Self-pay | Admitting: Family Medicine

## 2019-06-25 DIAGNOSIS — I10 Essential (primary) hypertension: Secondary | ICD-10-CM

## 2019-07-02 ENCOUNTER — Ambulatory Visit (INDEPENDENT_AMBULATORY_CARE_PROVIDER_SITE_OTHER): Payer: Medicare Other | Admitting: Family Medicine

## 2019-07-02 ENCOUNTER — Encounter: Payer: Self-pay | Admitting: Family Medicine

## 2019-07-02 ENCOUNTER — Other Ambulatory Visit: Payer: Self-pay

## 2019-07-02 VITALS — BP 155/78 | HR 78 | Temp 97.5°F | Resp 10 | Ht 64.5 in | Wt 116.4 lb

## 2019-07-02 DIAGNOSIS — I10 Essential (primary) hypertension: Secondary | ICD-10-CM | POA: Diagnosis not present

## 2019-07-02 DIAGNOSIS — Z0000 Encounter for general adult medical examination without abnormal findings: Secondary | ICD-10-CM

## 2019-07-02 DIAGNOSIS — Z91199 Patient's noncompliance with other medical treatment and regimen due to unspecified reason: Secondary | ICD-10-CM

## 2019-07-02 DIAGNOSIS — E611 Iron deficiency: Secondary | ICD-10-CM | POA: Diagnosis not present

## 2019-07-02 DIAGNOSIS — Z9119 Patient's noncompliance with other medical treatment and regimen: Secondary | ICD-10-CM | POA: Diagnosis not present

## 2019-07-02 DIAGNOSIS — I159 Secondary hypertension, unspecified: Secondary | ICD-10-CM | POA: Diagnosis not present

## 2019-07-02 DIAGNOSIS — E785 Hyperlipidemia, unspecified: Secondary | ICD-10-CM

## 2019-07-02 DIAGNOSIS — F40232 Fear of other medical care: Secondary | ICD-10-CM | POA: Insufficient documentation

## 2019-07-02 DIAGNOSIS — F39 Unspecified mood [affective] disorder: Secondary | ICD-10-CM | POA: Diagnosis not present

## 2019-07-02 DIAGNOSIS — G4700 Insomnia, unspecified: Secondary | ICD-10-CM

## 2019-07-02 DIAGNOSIS — E7800 Pure hypercholesterolemia, unspecified: Secondary | ICD-10-CM

## 2019-07-02 DIAGNOSIS — E538 Deficiency of other specified B group vitamins: Secondary | ICD-10-CM

## 2019-07-02 DIAGNOSIS — D179 Benign lipomatous neoplasm, unspecified: Secondary | ICD-10-CM

## 2019-07-02 MED ORDER — LOSARTAN POTASSIUM-HCTZ 100-25 MG PO TABS: 1.0000 | ORAL_TABLET | Freq: Every day | ORAL | 0 refills | Status: DC

## 2019-07-02 MED ORDER — ATORVASTATIN CALCIUM 10 MG PO TABS: 10.0000 mg | ORAL_TABLET | Freq: Every day | ORAL | 1 refills | Status: DC

## 2019-07-02 MED ORDER — BUSPIRONE HCL 7.5 MG PO TABS: ORAL_TABLET | ORAL | 5 refills | Status: DC

## 2019-07-02 NOTE — Progress Notes (Signed)
Impression and Recommendations:    1. 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 Personal history of noncompliance with medical treatment, presenting hazards to health   3. Hyperlipidemia, unspecified hyperlipidemia type   4. Fear of other medical care- fears drug S-E   5. Hypertension, goal below 150/90   6. Insomnia, unspecified type   7. Elevated LDL cholesterol level   8. Healthcare maintenance   9. Iron deficiency   10. B12 deficiency   11. Lipoma, unspecified site- scalp * 2     Personal History of Noncompliance w/ Medical Treatment - Last OV, pt was not taking medications for cholesterol or blood pressure appropriately. - Discussion held last OV about importance of compliance with treatment plan.  Reviewed importance of compliance with patient today and discussed options available to the patient to review her medication list and ensure that her prescriptions are safe to take together.  Hyperlipidemia, Elevated LDL Cholesterol Level - Cholesterol levels at goal last check.  - Per patient, currently compliant with full dose of Lipitor. - Pt will continue current treatment regimen.  See med list.  - For appropriate benefits of fish oil dosage, told patient to take two grams in the morning, and two grams in the evening.  Education provided and all questions answered.  - Ongoing prudent dietary changes such as low saturated & trans fat diets for hyperlipidemia and low carb diets for hypertriglyceridemia discussed with patient.    We will continue to monitor and re-check as recommended.  Secondary Hypertension - Last seen on 05/05/2019. - BP at that time was measured at 152/86. - BP remains suboptimally controlled, at 155/78 on intake.  - Per patient, has been taking 50 mg (half dose) of meds. - Told patient to increase to full 100 mg tablet.  See med list. - Per patient, tolerating 50 mg well without S-E.   - Counseled patient on pathophysiology of disease and discussed various treatment options, which always includes dietary and lifestyle modification as first line.   - Lifestyle changes such as dash and heart healthy diets and engaging in a regular exercise program discussed extensively with patient.   - Ambulatory blood pressure monitoring encouraged at least 3 times weekly.  Keep log and bring in every office visit.  Reminded patient that if they ever feel poorly in any way, to check their blood pressure and pulse.  - Handouts provided at patient's desire and/or told to go online at the [facility] website for further information  - We will continue to monitor  Mood Disorder - Per patient, taking 7.5 mg of Buspar 3 times per day. - Per patient, symptoms well-controlled on current treatment plan. - Continue management as established.  See med list. - Will continue to monitor.  Insomnia - Patient historically and currently managed on nortriptyline. - Risks of chronic use of nortriptyline discussed with patient today. - Patient does not wish to discontinue nortriptyline today.  - Discussed use of trazodone as an alternative. - Lengthy discussion held with patient today. - Education provided and all questions answered.  - Told patient to research trazodone and call in if she wishes to try it as an alternative. - Will continue to monitor and modify treatment plan as recommended. - Patient knows to call in with any concerns or questions.   Soft Tissue Cysts/ Lipoma - Discussed referral to dermatology for removal of cysts if desired. - Per  patient, cysts do not cause pain and are not interfering with quality of life. - Patient declines referral to dermatology today. - Will continue to monitor and provide referral PRN.  Recommendations - Need for full fasting lab work near future, with iron, B12.    Orders Placed This Encounter  Procedures  . CMP (comprehensive  metabolic panel)  . CBC w/Diff  . TSH  . T4, free  . Lipid panel  . Hemoglobin A1c  . VITAMIN D 25 Hydroxy (Vit-D Deficiency, Fractures)  . Folate  . Iron and TIBC  . Ferritin  . Vitamin B12     Meds ordered this encounter  Medications  . atorvastatin (LIPITOR) 10 MG tablet    Sig: Take 1 tablet (10 mg total) by mouth at bedtime.    Dispense:  90 tablet    Refill:  1  . losartan-hydrochlorothiazide (HYZAAR) 100-25 MG tablet    Sig: Take 1 tablet by mouth daily.    Dispense:  90 tablet    Refill:  0    Requesting 1 year supply  . busPIRone (BUSPAR) 7.5 MG tablet    Sig: 1 tab q 8 hrs daily    Dispense:  90 tablet    Refill:  5     Medications Discontinued During This Encounter  Medication Reason  . atorvastatin (LIPITOR) 10 MG tablet   . busPIRone (BUSPAR) 7.5 MG tablet Reorder  . losartan-hydrochlorothiazide (HYZAAR) 100-25 MG tablet Reorder     Gross side effects, risk and benefits, and alternatives of medications and treatment plan in general discussed with patient.  Patient is aware that all medications have potential side effects and we are unable to predict every side effect or drug-drug interaction that may occur.   Patient will call with any questions prior to using medication if they have concerns.    Expresses verbal understanding and consents to current therapy and treatment regimen.  No barriers to understanding were identified.  Red flag symptoms and signs discussed in detail.  Patient expressed understanding regarding what to do in case of emergency\urgent symptoms  Please see AVS handed out to patient at the end of our visit for further patient instructions/ counseling done pertaining to today's office visit.   Return for f/up 3-4 months for Bp, mood- (INc BP med dose).     Note:  This note was prepared with assistance of Dragon voice recognition software. Occasional wrong-word or sound-a-like substitutions may have occurred due to the inherent  limitations of voice recognition software.   This document serves as a record of services personally performed by Mellody Dance, DO. It was created on her behalf by Toni Amend, a trained medical scribe. The creation of this record is based on the scribe's personal observations and the provider's statements to them.   This case required medical decision making of at least moderate complexity. The above documentation has been reviewed to be accurate and was completed by Marjory Sneddon, D.O.      --------------------------------------------------------------------------------------------------------------------------------------------------------------------------------------------------------------------------------------------    Subjective:   Michele Tyler, am serving as scribe for Dr. Mellody Dance.  Here for return in 4-6 weeks to review progress after changes of treatment plan.  HPI: Michele Tyler is a 73 y.o. female who presents to [place] [facility] today for issues as discussed below.  Patient continues to fear side-effects / interactions of medications and therefore does not wish to increase her dosage on most of them.  - Supplementation Continues B12 and  iron supplement, "all the vitamins." Continues Vitamin D 5000 daily.  - Mood Management Thinks the Buspar is helping; "I think it takes the edge off."  She takes 7.5 mg three times per day.  Says she "still has those moments (of anxiety), but this has been a lifetime trouble."  Says she just "has to keep on going" regardless of how she feels.  Confirms things are more tolerable on current management.  Notes in the past, she has tried Prozac, Effexor, Elavil, Lexapro.  She has also tried Paxil.  - Insomnia While taking nortriptyline, notes "I can go to sleep, but I just wake up a lot."  Says she thinks this is "a function of aging above all else."  Notes she also continues  to wake up in the morning with "major panic."  - Soft Tissue cysts Notes she has some soft tissue cysts that she wanted examined.  Says they have been there "forever," all her life, have never bothered her and are not bothering her currently.  She does not wish to do anything to treat them at this time.  HPI:  Hypertension:  -  Her blood pressure at home has been running: on average in the 140's/80's.  Notes it's higher in the mornings and in the evenings it "drops down."  Morning BP values: 145/89 144/87 152/84 158/90 149/85 145/91 157/78 140/78 151/83  Says she was taking 12.5 mg of her medication in October. She is now taking 50 mg of her blood pressure medication. Says "sometimes I feel a little tired during the day," but denies other S-E. Notes she was hoping she wouldn't have to increase to the 100 mg dose.  - Her denies acute concerns or problems related to treatment plan  - She denies new onset of: chest pain, exercise intolerance, shortness of breath, dizziness, visual changes, headache, lower extremity swelling or claudication.   Last 3 blood pressure readings in our office are as follows: BP Readings from Last 3 Encounters:  07/02/19 (!) 155/78  05/05/19 (!) 152/86  11/04/18 (!) 165/96   Filed Weights   07/02/19 0913  Weight: 116 lb 6.4 oz (52.8 kg)    HPI:  Hyperlipidemia:  73 y.o. female here for cholesterol follow-up.   - Patient confirms good compliance with treatment plan of:  medication and/ or lifestyle management.    Says she has been taking the full/appropriate dose of Lipitor.  Thinks she has been taking this "since back in the spring or something."  Thinks she is taking 1500 mg of fish oil, but is unsure of dosage.  - Patient denies any acute concerns or problems with management plan   - She denies new onset of: myalgias, arthralgias, increased fatigue more than normal, chest pains, exercise intolerance, shortness of breath, dizziness,  visual changes, headache, lower extremity swelling or claudication.   Most recent cholesterol panel was:  Lab Results  Component Value Date   CHOL 194 07/02/2019   HDL 111 07/02/2019   LDLCALC 74 07/02/2019   TRIG 47 07/02/2019   CHOLHDL 1.7 07/02/2019   Hepatic Function Latest Ref Rng & Units 07/02/2019 10/28/2018 03/20/2018  Total Protein 6.0 - 8.5 g/dL 6.7 6.8 6.4  Albumin 3.7 - 4.7 g/dL 4.6 4.7 4.3  AST 0 - 40 IU/L _0 ALT 0 - 32 IU/L _1 Alk Phosphatase 39 - 117 IU/L 80 73 71  Total Bilirubin 0.0 - 1.2 mg/dL 0.4 0.4 0.4      Wt  Readings from Last 3 Encounters:  07/02/19 116 lb 6.4 oz (52.8 kg)  05/05/19 111 lb (50.3 kg)  11/04/18 112 lb 14.4 oz (51.2 kg)    Pulse Readings from Last 3 Encounters:  07/02/19 78  05/05/19 91  11/04/18 93   BMI Readings from Last 3 Encounters:  07/02/19 19.67 kg/m  05/05/19 19.05 kg/m  11/04/18 19.38 kg/m     Patient Care Team    Relationship Specialty Notifications Start End  Mellody Dance, DO PCP - General Family Medicine  02/18/18      Patient Active Problem List   Diagnosis Date Noted  . Elevated LDL cholesterol level 03/25/2018    Priority: High  . Hyperlipidemia 03/25/2018    Priority: High  . Hypertension, goal below 150/90 02/18/2018    Priority: High  . Insomnia 03/25/2018    Priority: Medium  . Mood disorder ([place]) 02/18/2018    Priority: Medium  . Generalized anxiety disorder 03/19/2013    Priority: Medium  . Personal history of noncompliance with medical treatment, presenting hazards to health 05/08/2019    Priority: Low  . Iron deficiency 07/04/2019  . B12 deficiency 07/04/2019  . Lipoma 07/04/2019  . Fear of other medical care- signficant fears of drug S-E inhibiting pt's ability to take medications 07/02/2019  . Elevated blood-pressure reading without diagnosis of hypertension-may be anxiety mediated 07/01/2018    Past Medical history, Surgical history, Family history, Social history,  Allergies and Medications have been entered into the medical record, reviewed and changed as needed.    Current Meds  Medication Sig  . Apple Cider Vinegar 600 MG CAPS Take 1 capsule by mouth daily.  . Ascorbic Acid (VITAMIN C CR) 1500 MG TBCR Take 1 tablet by mouth daily.  Marland Kitchen atorvastatin (LIPITOR) 10 MG tablet Take 1 tablet (10 mg total) by mouth at bedtime.  . B Complex-Folic Acid (BALANCED S-923 PO) Take by mouth.  . busPIRone (BUSPAR) 7.5 MG tablet 1 tab q 8 hrs daily  . CALCIUM-MAGNESUIUM-ZINC 333-133-8.3 MG TABS Take 4 tablets by mouth daily.  . Cholecalciferol (VITAMIN D3) 5000 units CAPS Take 1 capsule by mouth daily.  . Cyanocobalamin (B-12) 1000 MCG CAPS Take 1 capsule by mouth.  . Ferrous Sulfate (IRON) 325 (65 Fe) MG TABS Take 1 tablet by mouth daily.  . folic acid (FOLVITE) 300 MCG tablet Take 800 mcg by mouth daily.  Marland Kitchen losartan-hydrochlorothiazide (HYZAAR) 100-25 MG tablet Take 1 tablet by mouth daily.  . nortriptyline (PAMELOR) 25 MG capsule Take 1 capsule (25 mg total) by mouth at bedtime.  . Omega-3 Fatty Acids (OMEGA-3 FISH OIL PO) Take 1 capsule by mouth daily. Contains omega 3 450 mg and fish oil 1500 mg  . Soy Isoflavone 750 MG CAPS Take 1 capsule by mouth daily.  . TURMERIC PO Take 800 mg by mouth.  . vitamin A 8000 UNIT capsule Take 8,000 Units by mouth daily.  Marland Kitchen VITAMIN E-1000 PO Take 1 capsule by mouth daily.  . Vitamins-Lipotropics (BALANCED B-150 COMPLEX TR PO) Take 1 tablet by mouth daily. Contains b1 137m, b2 1580m b6 15026mb12 150m82miacin 150 mg  . [DISCONTINUED] atorvastatin (LIPITOR) 10 MG tablet Take 0.5 tablets (5 mg total) by mouth at bedtime.  . [DISCONTINUED] busPIRone (BUSPAR) 7.5 MG tablet 1/2 tab q 8 hrs daily  . [DISCONTINUED] losartan-hydrochlorothiazide (HYZAAR) 100-25 MG tablet Take 1 tablet by mouth daily. **PATIENT NEEDS APT FOR FURTHER REFILLS** (Patient taking differently: Take 0.5 tablets by mouth daily. **PATIENT NEEDS APT  FOR FURTHER  REFILLS**)    Allergies:  Allergies  Allergen Reactions  . Codeine Nausea Only     Review of Systems:  A fourteen system review of systems was performed and found to be positive as per HPI.   Objective:   Blood pressure (!) 155/78, pulse 78, temperature (!) 97.5 F (36.4 C), temperature source Oral, resp. rate 10, height 5' 4.5" (1.638 m), weight 116 lb 6.4 oz (52.8 kg), SpO2 98 %. Body mass index is 19.67 kg/m. General:  Well Developed, well nourished, appropriate for stated age.  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  no gross rash, warm, pink. Cardiac:  RRR, S1 S2 Respiratory:  ECTA B/L and A/P, Not using accessory muscles, speaking in full sentences- unlabored. Vascular:  Ext warm, no cyanosis apprec.; cap RF less 2 sec. Psych:  No HI/SI, judgement and insight good, Euthymic mood. Full Affect.

## 2019-07-03 LAB — LIPID PANEL
Chol/HDL Ratio: 1.7 ratio (ref 0.0–4.4)
Cholesterol, Total: 194 mg/dL (ref 100–199)
HDL: 111 mg/dL (ref >39)
LDL Chol Calc (NIH): 74 mg/dL (ref 0–99)
Triglycerides: 47 mg/dL (ref 0–149)
VLDL Cholesterol Cal: 9 mg/dL (ref 5–40)

## 2019-07-03 LAB — CBC WITH DIFFERENTIAL/PLATELET
Basophils Absolute: 0 10*3/uL (ref 0.0–0.2)
Basos: 1 %
EOS (ABSOLUTE): 0.1 10*3/uL (ref 0.0–0.4)
Eos: 1 %
Hematocrit: 42.9 % (ref 34.0–46.6)
Hemoglobin: 14.1 g/dL (ref 11.1–15.9)
Immature Grans (Abs): 0 10*3/uL (ref 0.0–0.1)
Immature Granulocytes: 0 %
Lymphocytes Absolute: 0.7 10*3/uL (ref 0.7–3.1)
Lymphs: 13 %
MCH: 29.1 pg (ref 26.6–33.0)
MCHC: 32.9 g/dL (ref 31.5–35.7)
MCV: 89 fL (ref 79–97)
Monocytes Absolute: 0.5 10*3/uL (ref 0.1–0.9)
Monocytes: 8 %
Neutrophils Absolute: 4.4 10*3/uL (ref 1.4–7.0)
Neutrophils: 77 %
Platelets: 473 10*3/uL — ABNORMAL HIGH (ref 150–450)
RBC: 4.84 x10E6/uL (ref 3.77–5.28)
RDW: 12.6 % (ref 11.7–15.4)
WBC: 5.7 10*3/uL (ref 3.4–10.8)

## 2019-07-03 LAB — COMPREHENSIVE METABOLIC PANEL
ALT: 19 IU/L (ref 0–32)
AST: 24 IU/L (ref 0–40)
Albumin/Globulin Ratio: 2.2 (ref 1.2–2.2)
Albumin: 4.6 g/dL (ref 3.7–4.7)
Alkaline Phosphatase: 80 IU/L (ref 39–117)
BUN/Creatinine Ratio: 12 (ref 12–28)
BUN: 8 mg/dL (ref 8–27)
Bilirubin Total: 0.4 mg/dL (ref 0.0–1.2)
CO2: 24 mmol/L (ref 20–29)
Calcium: 9.4 mg/dL (ref 8.7–10.3)
Chloride: 94 mmol/L — ABNORMAL LOW (ref 96–106)
Creatinine, Ser: 0.65 mg/dL (ref 0.57–1.00)
GFR calc Af Amer: 103 mL/min/{1.73_m2} (ref >59)
GFR calc non Af Amer: 90 mL/min/{1.73_m2} (ref >59)
Globulin, Total: 2.1 g/dL (ref 1.5–4.5)
Glucose: 88 mg/dL (ref 65–99)
Potassium: 4.8 mmol/L (ref 3.5–5.2)
Sodium: 133 mmol/L — ABNORMAL LOW (ref 134–144)
Total Protein: 6.7 g/dL (ref 6.0–8.5)

## 2019-07-03 LAB — FERRITIN: Ferritin: 298 ng/mL — ABNORMAL HIGH (ref 15–150)

## 2019-07-03 LAB — VITAMIN B12: Vitamin B-12: 982 pg/mL (ref 232–1245)

## 2019-07-03 LAB — T4, FREE: Free T4: 1.25 ng/dL (ref 0.82–1.77)

## 2019-07-03 LAB — FOLATE: Folate: >20 ng/mL (ref >3.0)

## 2019-07-03 LAB — IRON AND TIBC
Iron Saturation: 23 % (ref 15–55)
Iron: 74 ug/dL (ref 27–139)
Total Iron Binding Capacity: 316 ug/dL (ref 250–450)
UIBC: 242 ug/dL (ref 118–369)

## 2019-07-03 LAB — VITAMIN D 25 HYDROXY (VIT D DEFICIENCY, FRACTURES): Vit D, 25-Hydroxy: 38.2 ng/mL (ref 30.0–100.0)

## 2019-07-03 LAB — HEMOGLOBIN A1C
Est. average glucose Bld gHb Est-mCnc: 114 mg/dL
Hgb A1c MFr Bld: 5.6 % (ref 4.8–5.6)

## 2019-07-03 LAB — TSH: TSH: 0.489 u[IU]/mL (ref 0.450–4.500)

## 2019-07-04 DIAGNOSIS — E538 Deficiency of other specified B group vitamins: Secondary | ICD-10-CM | POA: Insufficient documentation

## 2019-07-04 DIAGNOSIS — D179 Benign lipomatous neoplasm, unspecified: Secondary | ICD-10-CM | POA: Insufficient documentation

## 2019-07-04 DIAGNOSIS — E611 Iron deficiency: Secondary | ICD-10-CM | POA: Insufficient documentation

## 2019-07-05 ENCOUNTER — Telehealth: Payer: Self-pay | Admitting: Family Medicine

## 2019-07-05 NOTE — Telephone Encounter (Signed)
Patient left a VM during lunch saying she missed a call from [facility] and is requesting a call back when available.

## 2019-07-05 NOTE — Telephone Encounter (Signed)
See lab results. AS, CMA 

## 2019-07-06 ENCOUNTER — Telehealth: Payer: Self-pay | Admitting: Family Medicine

## 2019-07-06 NOTE — Telephone Encounter (Signed)
Supplement dosage information updated in chart.   Left message for patient to call back to discuss sleep aid. AS, CMA

## 2019-07-06 NOTE — Telephone Encounter (Signed)
Patient called and left VM asking for clinic staff to call her back. At last visit Dr. Jenetta Downer wanted her to call back with the list of supplements that she takes and have that updated on her med list.

## 2019-07-06 NOTE — Telephone Encounter (Signed)
 -   please add those supplements to her med list. 4grams of fish oil per day is good. Continue that.  If she would like to try another sleep agent-she would have to stop pamelor- so please d/c from her med list and add trazodone 100mg    1/2-1 tab po qhs sleep disp 90, no RF.  thnx!

## 2019-07-06 NOTE — Telephone Encounter (Signed)
Patient called with supplements and dosage she is taking.   Fish oil 3000mg  daily  Vitamin D 5000 IU daily  Patient states she thinks you told her to take 4000mg  of fish oil daily?   Patient also asking about Trazodone. She said it was discussed at her last office visit and she would like a 30 day supply sent to [facility] on [address].   Please advise. AS, CMA

## 2019-07-07 ENCOUNTER — Other Ambulatory Visit: Payer: Self-pay

## 2019-07-07 MED ORDER — TRAZODONE HCL 100 MG PO TABS: 50.0000 mg | ORAL_TABLET | Freq: Every day | ORAL | 0 refills | Status: DC

## 2019-07-07 NOTE — Progress Notes (Signed)
Patient is aware to D/C Pamelor and to start Trazodone 100mg  1/2 tab nightly. Patient asked if this would also treat depression like the Pamelor. I spoke with Valetta Fuller who advised yes, it would. Patient is aware and verbalized understanding. AS, CMA

## 2019-07-07 NOTE — Telephone Encounter (Signed)
Patient is aware to D/C Pamelor and to start Trazodone 100mg  1/2 tab nightly. Patient asked if this would also treat depression like the Pamelor. I spoke with Valetta Fuller who advised yes, it would. Patient is aware and verbalized understanding. AS, CMA

## 2019-09-12 ENCOUNTER — Ambulatory Visit: Payer: Medicare Other | Attending: Internal Medicine

## 2019-09-12 DIAGNOSIS — Z23 Encounter for immunization: Secondary | ICD-10-CM | POA: Insufficient documentation

## 2019-09-12 NOTE — Progress Notes (Signed)
   Covid-19 Vaccination Clinic  Name:  Michele Tyler    MRN: [id number] DOB: Dec 21, 1945  09/12/2019  [name] was observed post Covid-19 immunization for 15 minutes without incidence. She was provided with Vaccine Information Sheet and instruction to access the V-Safe system.   [name] was instructed to call 911 with any severe reactions post vaccine: Marland Kitchen Difficulty breathing  . Swelling of your face and throat  . A fast heartbeat  . A bad rash all over your body  . Dizziness and weakness    Immunizations Administered    Name Date Dose VIS Date Route   Pfizer COVID-19 Vaccine 09/12/2019  9:05 AM 0.3 mL 07/09/2019 Intramuscular   Manufacturer: [place]   Lot: [id number]   [place]: [id number]

## 2019-09-14 ENCOUNTER — Other Ambulatory Visit: Payer: Self-pay | Admitting: Family Medicine

## 2019-09-14 DIAGNOSIS — I10 Essential (primary) hypertension: Secondary | ICD-10-CM

## 2019-10-05 ENCOUNTER — Ambulatory Visit: Payer: Medicare Other | Attending: Internal Medicine

## 2019-10-05 DIAGNOSIS — Z23 Encounter for immunization: Secondary | ICD-10-CM | POA: Insufficient documentation

## 2019-10-05 NOTE — Progress Notes (Signed)
   Covid-19 Vaccination Clinic  Name:  Michele Tyler    MRN: [id number] DOB: [birth year]  10/05/2019  [name] was observed post Covid-19 immunization for 15 minutes without incident. She was provided with Vaccine Information Sheet and instruction to access the V-Safe system.   [name] was instructed to call 911 with any severe reactions post vaccine: Marland Kitchen Difficulty breathing  . Swelling of face and throat  . A fast heartbeat  . A bad rash all over body  . Dizziness and weakness   Immunizations Administered    Name Date Dose VIS Date Route   Pfizer COVID-19 Vaccine 10/05/2019  8:44 AM 0.3 mL 07/09/2019 Intramuscular   Manufacturer: [place]   Lot: [id number]   [place]: [id number]

## 2019-10-11 ENCOUNTER — Other Ambulatory Visit: Payer: Self-pay | Admitting: Family Medicine

## 2019-10-11 DIAGNOSIS — F39 Unspecified mood [affective] disorder: Secondary | ICD-10-CM

## 2019-10-20 DIAGNOSIS — H2513 Age-related nuclear cataract, bilateral: Secondary | ICD-10-CM | POA: Diagnosis not present

## 2019-11-03 ENCOUNTER — Encounter: Payer: Self-pay | Admitting: Family Medicine

## 2019-11-03 ENCOUNTER — Ambulatory Visit (INDEPENDENT_AMBULATORY_CARE_PROVIDER_SITE_OTHER): Payer: Medicare Other | Admitting: Family Medicine

## 2019-11-03 ENCOUNTER — Other Ambulatory Visit: Payer: Self-pay

## 2019-11-03 VITALS — BP 137/73 | HR 88 | Temp 97.7°F | Resp 12 | Ht 64.0 in | Wt 114.8 lb

## 2019-11-03 DIAGNOSIS — I10 Essential (primary) hypertension: Secondary | ICD-10-CM

## 2019-11-03 DIAGNOSIS — F39 Unspecified mood [affective] disorder: Secondary | ICD-10-CM

## 2019-11-03 DIAGNOSIS — G4700 Insomnia, unspecified: Secondary | ICD-10-CM | POA: Diagnosis not present

## 2019-11-03 MED ORDER — PAROXETINE HCL 10 MG PO TABS: ORAL_TABLET | ORAL | 0 refills | Status: DC

## 2019-11-03 MED ORDER — BUSPIRONE HCL 7.5 MG PO TABS: ORAL_TABLET | ORAL | 0 refills | Status: DC

## 2019-11-03 NOTE — Patient Instructions (Signed)
 Generalized Anxiety Disorder, Adult  Generalized anxiety disorder (GAD) is a mental health disorder. People with this condition constantly worry about everyday events. Unlike normal anxiety, worry related to GAD is not triggered by a specific event. These worries also do not fade or get better with time. GAD interferes with life functions, including relationships, work, and school. GAD can vary from mild to severe. People with severe GAD can have intense waves of anxiety with physical symptoms (panic attacks). What are the causes? The exact cause of GAD is not known. What increases the risk? This condition is more likely to develop in:  Women.  People who have a family history of anxiety disorders.  People who are very shy.  People who experience very stressful life events, such as the death of a loved one.  People who have a very stressful family environment.  What are the signs or symptoms? People with GAD often worry excessively about many things in their lives, such as their health and family. They may also be overly concerned about:  Doing well at work.  Being on time.  Natural disasters.  Friendships.  Physical symptoms of GAD include:  Fatigue.  Muscle tension or having muscle twitches.  Trembling or feeling shaky.  Being easily startled.  Feeling like your heart is pounding or racing.  Feeling out of breath or like you cannot take a deep breath.  Having trouble falling asleep or staying asleep.  Sweating.  Nausea, diarrhea, or irritable bowel syndrome (IBS).  Headaches.  Trouble concentrating or remembering facts.  Restlessness.  Irritability.  How is this diagnosed? Your health care provider can diagnose GAD based on your symptoms and medical history. You will also have a physical exam. The health care provider will ask specific questions about your symptoms, including how severe they are, when they started, and if they come and go. Your health  care provider may ask you about your use of alcohol or drugs, including prescription medicines. Your health care provider may refer you to a mental health specialist for further evaluation. Your health care provider will do a thorough examination and may perform additional tests to rule out other possible causes of your symptoms. To be diagnosed with GAD, a person must have anxiety that:  Is out of his or her control.  Affects several different aspects of his or her life, such as work and relationships.  Causes distress that makes him or her unable to take part in normal activities.  Includes at least three physical symptoms of GAD, such as restlessness, fatigue, trouble concentrating, irritability, muscle tension, or sleep problems.  Before your health care provider can confirm a diagnosis of GAD, these symptoms must be present more days than they are not, and they must last for six months or longer. How is this treated? The following therapies are usually used to treat GAD:  Medicine. Antidepressant medicine is usually prescribed for long-term daily control. Antianxiety medicines may be added in severe cases, especially when panic attacks occur.  Talk therapy (psychotherapy). Certain types of talk therapy can be helpful in treating GAD by providing support, education, and guidance. Options include: ? Cognitive behavioral therapy (CBT). People learn coping skills and techniques to ease their anxiety. They learn to identify unrealistic or negative thoughts and behaviors and to replace them with positive ones. ? Acceptance and commitment therapy (ACT). This treatment teaches people how to be mindful as a way to cope with unwanted thoughts and feelings. ? Biofeedback. This process trains   you to manage your body's response (physiological response) through breathing techniques and relaxation methods. You will work with a therapist while machines are used to monitor your physical symptoms.  Stress  management techniques. These include yoga, meditation, and exercise.  A mental health specialist can help determine which treatment is best for you. Some people see improvement with one type of therapy. However, other people require a combination of therapies. Follow these instructions at home:  Take over-the-counter and prescription medicines only as told by your health care provider.  Try to maintain a normal routine.  Try to anticipate stressful situations and allow extra time to manage them.  Practice any stress management or self-calming techniques as taught by your health care provider.  Do not punish yourself for setbacks or for not making progress.  Try to recognize your accomplishments, even if they are small.  Keep all follow-up visits as told by your health care provider. This is important. Contact a health care provider if:  Your symptoms do not get better.  Your symptoms get worse.  You have signs of depression, such as: ? A persistently sad, cranky, or irritable mood. ? Loss of enjoyment in activities that used to bring you joy. ? Change in weight or eating. ? Changes in sleeping habits. ? Avoiding friends or family members. ? Loss of energy for normal tasks. ? Feelings of guilt or worthlessness. Get help right away if:  You have serious thoughts about hurting yourself or others. If you ever feel like you may hurt yourself or others, or have thoughts about taking your own life, get help right away. You can go to your nearest emergency department or call:  Your local emergency services (911 in the [place]  A suicide crisis helpline, such as the National Suicide Prevention Lifeline at 1-800-273-8255. This is open 24 hours a day.  Summary  Generalized anxiety disorder (GAD) is a mental health disorder that involves worry that is not triggered by a specific event.  People with GAD often worry excessively about many things in their lives, such as their health and  family.  GAD may cause physical symptoms such as restlessness, trouble concentrating, sleep problems, frequent sweating, nausea, diarrhea, headaches, and trembling or muscle twitching.  A mental health specialist can help determine which treatment is best for you. Some people see improvement with one type of therapy. However, other people require a combination of therapies. This information is not intended to replace advice given to you by your health care provider. Make sure you discuss any questions you have with your health care provider. Document Released: 11/09/2012 Document Revised: 06/04/2016 Document Reviewed: 06/04/2016 Elsevier Interactive Patient Education  2018 Elsevier Inc.      Living With Anxiety  After being diagnosed with an anxiety disorder, you may be relieved to know why you have felt or behaved a certain way. It is natural to also feel overwhelmed about the treatment ahead and what it will mean for your life. With care and support, you can manage this condition and recover from it. How to cope with anxiety Dealing with stress Stress is your body's reaction to life changes and events, both good and bad. Stress can last just a few hours or it can be ongoing. Stress can play a major role in anxiety, so it is important to learn both how to cope with stress and how to think about it differently. Talk with your health care provider or a counselor to learn more about stress reduction. He   or she may suggest some stress reduction techniques, such as:  Music therapy. This can include creating or listening to music that you enjoy and that inspires you.  Mindfulness-based meditation. This involves being aware of your normal breaths, rather than trying to control your breathing. It can be done while sitting or walking.  Centering prayer. This is a kind of meditation that involves focusing on a word, phrase, or sacred image that is meaningful to you and that brings you peace.  Deep  breathing. To do this, expand your stomach and inhale slowly through your nose. Hold your breath for 3-5 seconds. Then exhale slowly, allowing your stomach muscles to relax.  Self-talk. This is a skill where you identify thought patterns that lead to anxiety reactions and correct those thoughts.  Muscle relaxation. This involves tensing muscles then relaxing them.  Choose a stress reduction technique that fits your lifestyle and personality. Stress reduction techniques take time and practice. Set aside 5-15 minutes a day to do them. Therapists can offer training in these techniques. The training may be covered by some insurance plans. Other things you can do to manage stress include:  Keeping a stress diary. This can help you learn what triggers your stress and ways to control your response.  Thinking about how you respond to certain situations. You may not be able to control everything, but you can control your reaction.  Making time for activities that help you relax, and not feeling guilty about spending your time in this way.  Therapy combined with coping and stress-reduction skills provides the best chance for successful treatment. Medicines Medicines can help ease symptoms. Medicines for anxiety include:  Anti-anxiety drugs.  Antidepressants.  Beta-blockers.  Medicines may be used as the main treatment for anxiety disorder, along with therapy, or if other treatments are not working. Medicines should be prescribed by a health care provider. Relationships Relationships can play a big part in helping you recover. Try to spend more time connecting with trusted friends and family members. Consider going to couples counseling, taking family education classes, or going to family therapy. Therapy can help you and others better understand the condition. How to recognize changes in your condition Everyone has a different response to treatment for anxiety. Recovery from anxiety happens when  symptoms decrease and stop interfering with your daily activities at home or work. This may mean that you will start to:  Have better concentration and focus.  Sleep better.  Be less irritable.  Have more energy.  Have improved memory.  It is important to recognize when your condition is getting worse. Contact your health care provider if your symptoms interfere with home or work and you do not feel like your condition is improving. Where to find help and support: You can get help and support from these sources:  Self-help groups.  Online and community organizations.  A trusted spiritual leader.  Couples counseling.  Family education classes.  Family therapy.  Follow these instructions at home:  Eat a healthy diet that includes plenty of vegetables, fruits, whole grains, low-fat dairy products, and lean protein. Do not eat a lot of foods that are high in solid fats, added sugars, or salt.  Exercise. Most adults should do the following: ? Exercise for at least 150 minutes each week. The exercise should increase your heart rate and make you sweat (moderate-intensity exercise). ? Strengthening exercises at least twice a week.  Cut down on caffeine, tobacco, alcohol, and other potentially harmful substances.    Get the right amount and quality of sleep. Most adults need 7-9 hours of sleep each night.  Make choices that simplify your life.  Take over-the-counter and prescription medicines only as told by your health care provider.  Avoid caffeine, alcohol, and certain over-the-counter cold medicines. These may make you feel worse. Ask your pharmacist which medicines to avoid.  Keep all follow-up visits as told by your health care provider. This is important. Questions to ask your health care provider  Would I benefit from therapy?  How often should I follow up with a health care provider?  How long do I need to take medicine?  Are there any long-term side effects of my  medicine?  Are there any alternatives to taking medicine? Contact a health care provider if:  You have a hard time staying focused or finishing daily tasks.  You spend many hours a day feeling worried about everyday life.  You become exhausted by worry.  You start to have headaches, feel tense, or have nausea.  You urinate more than normal.  You have diarrhea. Get help right away if:  You have a racing heart and shortness of breath.  You have thoughts of hurting yourself or others. If you ever feel like you may hurt yourself or others, or have thoughts about taking your own life, get help right away. You can go to your nearest emergency department or call:  Your local emergency services (911 in the [place]  A suicide crisis helpline, such as the National Suicide Prevention Lifeline at 1-800-273-8255. This is open 24-hours a day.  Summary  Taking steps to deal with stress can help calm you.  Medicines cannot cure anxiety disorders, but they can help ease symptoms.  Family, friends, and partners can play a big part in helping you recover from an anxiety disorder. This information is not intended to replace advice given to you by your health care provider. Make sure you discuss any questions you have with your health care provider. Document Released: 07/09/2016 Document Revised: 07/09/2016 Document Reviewed: 07/09/2016 Elsevier Interactive Patient Education  2018 Elsevier Inc.   

## 2019-11-03 NOTE — Progress Notes (Signed)
Impression and Recommendations:    1. Hypertension, goal below 150/90   2. 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 Insomnia, unspecified type     Mood disorder: - Buspar does help with anxiety and patient is agreeable to increasing the dose to 1.5 tablet twice a day and can take 2 tablets for evening dose if needed.  - R/B paxil d/c pt.   Start after long discussion had with pt.   -Patient is agreeable to starting on a low dose and can take 0.5 tablet of 10 mg every other day to help with depression.   - Notify clinic if having SEs  Hypertension: - Well-controlled on current medication regimen.   - Denies SEs - Continue strength training 2x/wk and aerobic exercise 3x/wk.  Insomnia: - Discontinue trazodone; given buspar- night time dose was increased. - sleep hygiene d/c pt   Education and routine counseling performed. Handouts provided.   Medications Discontinued During This Encounter  Medication Reason  . busPIRone (BUSPAR) 7.5 MG tablet Reorder  . traZODone (DESYREL) 100 MG tablet      Meds ordered this encounter  Medications  . PARoxetine (PAXIL) 10 MG tablet    Sig: Take 0.5 tablet daily.    Dispense:  30 tablet    Refill:  0  . busPIRone (BUSPAR) 7.5 MG tablet    Sig: 1.5 tab q 8 hrs daily as needed. (can take 2 for nighttime dose if needed)    Dispense:  1 tablet    Refill:  0    Gross side effects, risk and benefits, and alternatives of medications and treatment plan in general discussed with patient.  Patient is aware that all medications have potential side effects and we are unable to predict every side effect or drug-drug interaction that may occur.   Patient will call with any questions prior to using medication if they have concerns.  Expresses verbal understanding and consents to current therapy and treatment regimen.  No barriers to understanding were identified.   Red flag symptoms  and signs discussed in detail.  Patient expressed understanding regarding what to do in case of emergency\urgent symptoms  Please see AVS handed out to patient at the end of our visit for further patient instructions/ counseling done pertaining to today's office visit.   Return for f/up in 3 mo for mood management/new med paxil started.     Note:  This document was prepared using Dragon voice recognition software and may include unintentional dictation errors.   The [place] was signed into law in 2016 which includes the topic of electronic health records.  This provides immediate access to information in MyChart.  This includes consultation notes, operative notes, office notes, lab results and pathology reports.  If you have any questions about what you read please let us know at your next visit or call us at the office.  We are right here with you.     --------------------------------------------------------------------------------------------------------------------------------------------------------------------------------------------------------------------------------------------    Subjective:    CC:  Chief Complaint  Patient presents with  . Depression  . Anxiety  . Hypertension    HPI: Michele Tyler is a 74 y.o. female who presents to [facility] today for follow-up of mood, insomnia and hypertension.    She stopped nortriptyline and states has been able to think clearly but has noticed increased emotions like "crying"  She feels trazodone is strong  and "knocking her out" and has helped with sleeping but not with her mood.   Requested RF for buspar and has been out since March. States RF request was declined by the office.   She has been stressed with her job and reports increased anxiety and depression, NO SI  She has tried several mood medications in the past that she wasn't able to tolerate. Willing to try another at  low dose  Depression screen [facility] 2/9 11/03/2019 07/02/2019 11/04/2018  Decreased Interest 2 3 0  Down, Depressed, Hopeless 3 3 0  PHQ - 2 Score 5 6 0  Altered sleeping 1 2 0  Tired, decreased energy 1 1 0  Change in appetite 0 0 0  Feeling bad or failure about yourself  3 2 0  Trouble concentrating 1 1 0  Moving slowly or fidgety/restless 0 0 0  Suicidal thoughts 0 0 0  PHQ-9 Score 11 12 0  Difficult doing work/chores Somewhat difficult Not difficult at all Not difficult at all     GAD 7 : Generalized Anxiety Score 11/03/2019 07/02/2019 07/01/2018 03/25/2018  Nervous, Anxious, on Edge 3 1 3 2   Control/stop worrying 3 3 3 1   Worry too much - different things 3 2 3 1   Trouble relaxing 3 2 3 1   Restless 0 0 2 1  Easily annoyed or irritable 3 3 2 1   Afraid - awful might happen 3 3 3  0  Total GAD 7 Score 18 14 19 7   Anxiety Difficulty Somewhat difficult Not difficult at all Very difficult -     Wt Readings from Last 3 Encounters:  11/03/19 114 lb 12.8 oz (52.1 kg)  07/02/19 116 lb 6.4 oz (52.8 kg)  05/05/19 111 lb (50.3 kg)   BP Readings from Last 3 Encounters:  11/03/19 137/73  07/02/19 (!) 155/78  05/05/19 (!) 152/86   Pulse Readings from Last 3 Encounters:  11/03/19 88  07/02/19 78  05/05/19 91   BMI Readings from Last 3 Encounters:  11/03/19 19.71 kg/m  07/02/19 19.67 kg/m  05/05/19 19.05 kg/m         Patient Care Team    Relationship Specialty Notifications Start End  Mellody Dance, DO PCP - General Family Medicine  02/18/18      Patient Active Problem List   Diagnosis Date Noted  . Elevated LDL cholesterol level 03/25/2018  . Hyperlipidemia 03/25/2018  . Hypertension, goal below 150/90 02/18/2018  . Insomnia 03/25/2018  . Mood disorder ([place]) 02/18/2018  . Generalized anxiety disorder 03/19/2013  . Personal history of noncompliance with medical treatment, presenting hazards to health 05/08/2019  . Iron deficiency 07/04/2019  . B12 deficiency  07/04/2019  . Lipoma 07/04/2019  . Fear of other medical care- signficant fears of drug S-E inhibiting pt's ability to take medications 07/02/2019  . Elevated blood-pressure reading without diagnosis of hypertension-may be anxiety mediated 07/01/2018    Past Medical history, Surgical history, Family history, Social history, Allergies and Medications have been entered into the medical record, reviewed and changed as needed.    Current Meds  Medication Sig  . Apple Cider Vinegar 600 MG CAPS Take 1 capsule by mouth daily.  . Ascorbic Acid (VITAMIN C CR) 1500 MG TBCR Take 1 tablet by mouth daily.  Marland Kitchen atorvastatin (LIPITOR) 10 MG tablet Take 1 tablet (10 mg total) by mouth at bedtime.  . B Complex-Folic Acid (BALANCED 0000000 PO) Take by mouth.  . busPIRone (BUSPAR) 7.5 MG tablet 1.5 tab q 8  hrs daily as needed. (can take 2 for nighttime dose if needed)  . CALCIUM-MAGNESUIUM-ZINC 333-133-8.3 MG TABS Take 4 tablets by mouth daily.  . Cholecalciferol (VITAMIN D3) 5000 units CAPS Take 1 capsule by mouth daily.  . Cyanocobalamin (B-12) 1000 MCG CAPS Take 1 capsule by mouth.  . Ferrous Sulfate (IRON) 325 (65 Fe) MG TABS Take 1 tablet by mouth daily.  . folic acid (FOLVITE) Q000111Q MCG tablet Take 800 mcg by mouth daily.  Marland Kitchen losartan-hydrochlorothiazide (HYZAAR) 100-25 MG tablet TAKE 1 TABLET BY MOUTH  DAILY  . Omega-3 Fatty Acids (OMEGA-3 FISH OIL PO) Take 4,000 mg by mouth daily. Contains omega 3 450 mg and fish oil 1500 mg   . Soy Isoflavone 750 MG CAPS Take 1 capsule by mouth daily.  . TURMERIC PO Take 800 mg by mouth.  . vitamin A 8000 UNIT capsule Take 8,000 Units by mouth daily.  Marland Kitchen VITAMIN E-1000 PO Take 1 capsule by mouth daily.  . Vitamins-Lipotropics (BALANCED B-150 COMPLEX TR PO) Take 1 tablet by mouth daily. Contains b1 150mg , b2 150mg , b6 150mg , b12 150mg , niacin 150 mg  . [DISCONTINUED] busPIRone (BUSPAR) 7.5 MG tablet 1 tab q 8 hrs daily    Allergies:  Allergies  Allergen Reactions  .  Codeine Nausea Only    Review of Systems: General:   No F/C, wt loss Pulm:   No DIB, SOB, pleuritic chest pain Card:  No CP, palpitations Abd:  No n/v/d or pain Ext:  No inc edema from baseline Psych: no SI/ HI   Objective:   Blood pressure 137/73, pulse 88, temperature 97.7 F (36.5 C), temperature source Oral, resp. rate 12, height 5\' 4"  (1.626 m), weight 114 lb 12.8 oz (52.1 kg), SpO2 99 %. Body mass index is 19.71 kg/m. General:  Well Developed, well nourished, appropriate for stated age.  Neuro:  Alert and oriented,  extra-ocular muscles intact  HEENT:  Normocephalic, atraumatic, neck supple Skin:  no gross rash, warm, pink. Cardiac:  RRR, S1 S2 Respiratory:  ECTA B/L and A/P, Not using accessory muscles, speaking in full sentences- unlabored. Vascular:  Ext warm, no cyanosis apprec.; cap RF less 2 sec. Psych:  No HI/SI, judgement and insight good, Euthymic mood. Full Affect.

## 2019-11-15 ENCOUNTER — Other Ambulatory Visit: Payer: Self-pay | Admitting: Family Medicine

## 2019-11-15 DIAGNOSIS — F39 Unspecified mood [affective] disorder: Secondary | ICD-10-CM

## 2019-11-16 ENCOUNTER — Telehealth: Payer: Self-pay | Admitting: Family Medicine

## 2019-11-16 NOTE — Telephone Encounter (Signed)
Patient was last seen 11/03/2019 and advised to follow up in 3 months.  Patient was given refill at this visit of Buspar but only given #1 tablet with no refills by you.   Please advise if refill appropriate.   AS, CMA

## 2019-11-17 ENCOUNTER — Other Ambulatory Visit: Payer: Self-pay | Admitting: Family Medicine

## 2019-11-17 DIAGNOSIS — F39 Unspecified mood [affective] disorder: Secondary | ICD-10-CM

## 2019-11-18 ENCOUNTER — Telehealth: Payer: Self-pay | Admitting: Family Medicine

## 2019-11-18 ENCOUNTER — Other Ambulatory Visit: Payer: Self-pay | Admitting: Family Medicine

## 2019-11-18 DIAGNOSIS — F39 Unspecified mood [affective] disorder: Secondary | ICD-10-CM

## 2019-11-18 NOTE — Telephone Encounter (Signed)
Patient is aware of the below and verbalized understanding. AS, CMA 

## 2019-11-18 NOTE — Telephone Encounter (Signed)
Medication was sent over 11/16/2019 with receipt from pharmacy received.  Left message for patient to call back. 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        Order Class  Normal  All Administrations of busPIRone (BUSPAR) 7.5 MG tablet  (suggestion)  The administrations shown are only for this specific order and not for other orders for the same medication that may be in this encounter.  No Administrations Recorded         Med Administrations and Associated Flowsheet Values (last 96 hours)  None  Warnings History  No Interaction Warnings Shown   Order History Outpatient Date/Time Action Taken User Additional Information  11/15/19 2309 [employer], Surescripts Out   11/16/19 1306 Sign Mellody Dance, DO Reorder from [id number]  11/16/19 1306 Taking [place], Deborah, DO   11/17/19 2322 Reorder Interface, Surescripts Out To [employer]  Tracking  Links Cosign Tracking Order Transmittal Tracking  Outpatient Medication Detail   Disp Refills Start End   busPIRone (BUSPAR) 7.5 MG tablet 270 tablet 0 11/16/2019    Sig: TAKE 1 TABLET BY MOUTH 3  TIMES DAILY   Sent to pharmacy as: busPIRone (BUSPAR) 7.5 MG tablet   E-Prescribing Status: Receipt confirmed by pharmacy (11/16/2019  1:06 PM EDT)

## 2019-11-18 NOTE — Telephone Encounter (Signed)
Patient called requesting a call back from clinic staff. She sated she spoke to a CMA earlier this week about refills to be sent to [facility] but she states that nothing has been received by them. Please contact patient when available

## 2019-11-23 ENCOUNTER — Telehealth: Payer: Self-pay | Admitting: Family Medicine

## 2019-11-23 NOTE — Telephone Encounter (Signed)
Please remind patient she was supposed to double up on her BuSpar dose at bedtime.  Also, remind patient that the Paxil was started at a very low dose to ensure she did not have any side effects to it.  This absolutely can be increased in the future since she tolerates it well and it is working well.    If she would like, we can send in a new dose of 20 mg of the Paxil  (1 p.o. daily dispense #90 no refill ) if she has been taking the Paxil 10 mg daily and doing well with it.  Please let me know.

## 2019-11-23 NOTE — Telephone Encounter (Signed)
LVM for pt to call to discuss.  T. Quantarius Genrich, CMA  

## 2019-11-23 NOTE — Telephone Encounter (Signed)
Patient was advised to call back after starting Paxil for a few weeks. Check in with how she is doing. CMA unavailable at time of call, please contact patient when available.

## 2019-11-23 NOTE — Telephone Encounter (Signed)
Pt states that the Paxil is helping "some".  She is taking the Buspar TID, but is waking up during the night and take an additional dose, but this not unusual for her.  Charyl Bigger, CMA

## 2019-11-23 NOTE — Telephone Encounter (Signed)
Pt states that she will double up on the Buspar at bedtime, but declines to increase dose of Paxil at this time, stating that she wishes to give the Paxil a little longer since she has only been on this medication for a couple of weeks.  Charyl Bigger, CMA

## 2019-11-24 NOTE — Telephone Encounter (Signed)
Ok,noted

## 2019-12-04 ENCOUNTER — Other Ambulatory Visit: Payer: Self-pay | Admitting: Family Medicine

## 2019-12-04 DIAGNOSIS — E785 Hyperlipidemia, unspecified: Secondary | ICD-10-CM

## 2019-12-20 ENCOUNTER — Telehealth: Payer: Self-pay | Admitting: Physician Assistant

## 2019-12-20 DIAGNOSIS — F39 Unspecified mood [affective] disorder: Secondary | ICD-10-CM

## 2019-12-20 MED ORDER — PAROXETINE HCL 10 MG PO TABS: ORAL_TABLET | ORAL | 0 refills | Status: DC

## 2019-12-20 NOTE — Addendum Note (Signed)
Addended by: Fonnie Mu on: 12/20/2019 11:57 AM   Modules accepted: Orders

## 2019-12-20 NOTE — Telephone Encounter (Signed)
Patient is requesting a refill of her Paxil, she is going out of town in a few weeks and will be out by that time, and with her using [facility] she wants to make sure it gets to her prior to the vacation. If approved please send to [facility].

## 2020-01-15 ENCOUNTER — Ambulatory Visit (INDEPENDENT_AMBULATORY_CARE_PROVIDER_SITE_OTHER): Payer: Medicare Other

## 2020-01-15 ENCOUNTER — Other Ambulatory Visit: Payer: Self-pay

## 2020-01-15 ENCOUNTER — Ambulatory Visit
Admission: EM | Admit: 2020-01-15 | Discharge: 2020-01-15 | Disposition: A | Discharge status: Home / Self Care | Payer: Medicare Other | Attending: Internal Medicine | Admitting: Internal Medicine

## 2020-01-15 ENCOUNTER — Encounter: Payer: Self-pay | Admitting: Emergency Medicine

## 2020-01-15 DIAGNOSIS — S92352A Displaced fracture of fifth metatarsal bone, left foot, initial encounter for closed fracture: Secondary | ICD-10-CM

## 2020-01-15 DIAGNOSIS — M79672 Pain in left foot: Secondary | ICD-10-CM | POA: Diagnosis not present

## 2020-01-15 DIAGNOSIS — M7989 Other specified soft tissue disorders: Secondary | ICD-10-CM | POA: Diagnosis not present

## 2020-01-15 HISTORY — DX: Essential (primary) hypertension: I10

## 2020-01-15 NOTE — ED Triage Notes (Signed)
Last Saturday rolled foot. Foot is bruised.

## 2020-01-15 NOTE — Discharge Instructions (Addendum)
Recommend RICE: rest, ice, compression, elevation as needed for pain.    Heat therapy (hot compress, warm wash rag, hot showers, etc.) can help relax muscles and soothe muscle aches. Cold therapy (ice packs) can be used to help swelling both after injury and after prolonged use of areas of chronic pain/aches.  For pain: Tylenol  Very important to follow-up with orthopedics for further evaluation and management. Do not put weight on your foot.  Wear boot at all times.

## 2020-01-15 NOTE — ED Provider Notes (Signed)
[facility]    CSN: [id number] Arrival date & time: 01/15/20  1541      History   Chief Complaint Chief Complaint  Patient presents with  . Foot Pain    left    HPI Michele Tyler is a 74 y.o. female history depression, anxiety presenting for left foot pain.  States she had an ankle inversion injury without fall last Saturday.  Denies head trauma, LOC.  Has been taking Tylenol twice daily and icing with some relief.  Patient denies ankle pain, states it is at the base of her fifth metatarsal.  Has noted bruising in between her toes since then.  Denies proximal leg swelling, paresthesia, hip pain, difficulty breathing or chest pain.  No anticoagulant blood thinner use.   Past Medical History:  Diagnosis Date  . Anxiety   . Depression   . Hypertension     Patient Active Problem List   Diagnosis Date Noted  . Iron deficiency 07/04/2019  . B12 deficiency 07/04/2019  . Lipoma 07/04/2019  . Fear of other medical care- signficant fears of drug S-E inhibiting pt's ability to take medications 07/02/2019  . Personal history of noncompliance with medical treatment, presenting hazards to health 05/08/2019  . Elevated blood-pressure reading without diagnosis of hypertension-may be anxiety mediated 07/01/2018  . Insomnia 03/25/2018  . Elevated LDL cholesterol level 03/25/2018  . Hyperlipidemia 03/25/2018  . Hypertension, goal below 150/90 02/18/2018  . Mood disorder ([place]) 02/18/2018  . Generalized anxiety disorder 03/19/2013    Past Surgical History:  Procedure Laterality Date  . DILATION AND CURETTAGE OF UTERUS  2002  . FACELIFT  2005    OB History   No obstetric history on file.      Home Medications    Prior to Admission medications   Medication Sig Start Date End Date Taking? Authorizing Provider  Apple Cider Vinegar 600 MG CAPS Take 1 capsule by mouth daily.    [provider]  Ascorbic Acid (VITAMIN C CR) 1500 MG TBCR Take 1 tablet by mouth  daily.    [provider]  atorvastatin (LIPITOR) 10 MG tablet TAKE 1 TABLET BY MOUTH AT  BEDTIME 12/06/19   Abonza, Maritza, PA-C  B Complex-Folic Acid (BALANCED G-867 PO) Take by mouth.    [provider]  busPIRone (BUSPAR) 7.5 MG tablet TAKE 1 TABLET BY MOUTH 3  TIMES DAILY 11/16/19   Opalski, Neoma Laming, DO  CALCIUM-MAGNESUIUM-ZINC 333-133-8.3 MG TABS Take 4 tablets by mouth daily.    [provider]  Cholecalciferol (VITAMIN D3) 5000 units CAPS Take 1 capsule by mouth daily.    [provider]  Cyanocobalamin (B-12) 1000 MCG CAPS Take 1 capsule by mouth.    [provider]  Ferrous Sulfate (IRON) 325 (65 Fe) MG TABS Take 1 tablet by mouth daily.    [provider]  folic acid (FOLVITE) 619 MCG tablet Take 800 mcg by mouth daily.    [provider]  losartan-hydrochlorothiazide (HYZAAR) 100-25 MG tablet TAKE 1 TABLET BY MOUTH  DAILY 09/15/19   Opalski, Neoma Laming, DO  Omega-3 Fatty Acids (OMEGA-3 FISH OIL PO) Take 4,000 mg by mouth daily. Contains omega 3 450 mg and fish oil 1500 mg     [provider]  PARoxetine (PAXIL) 10 MG tablet Take 0.5 tablet daily. 12/20/19   Lorrene Reid, PA-C  Soy Isoflavone 750 MG CAPS Take 1 capsule by mouth daily.    [provider]  TURMERIC PO Take 800 mg by  mouth.    [provider]  vitamin A 8000 UNIT capsule Take 8,000 Units by mouth daily.    [provider]  VITAMIN E-1000 PO Take 1 capsule by mouth daily.    [provider]  Vitamins-Lipotropics (BALANCED B-150 COMPLEX TR PO) Take 1 tablet by mouth daily. Contains b1 150mg , b2 150mg , b6 150mg , b12 150mg , niacin 150 mg    [provider]    Family History Family History  Problem Relation Age of Onset  . Hypertension Mother   . Depression Father   . Hypertension Father     Social History Social History   Tobacco Use  . Smoking status: Former Smoker    Packs/day: 1.00    Types:  Cigarettes    Quit date: 1988    Years since quitting: 33.4  . Smokeless tobacco: Never Used  Vaping Use  . Vaping Use: Never used  Substance Use Topics  . Alcohol use: Yes    Alcohol/week: 1.0 - 2.0 standard drink    Types: 1 - 2 Standard drinks or equivalent per week    Comment: social  . Drug use: No     Allergies   Codeine   Review of Systems As per HPI   Physical Exam Triage Vital Signs ED Triage Vitals  Enc Vitals Group     BP      Pulse      Resp      Temp      Temp src      SpO2      Weight      Height      Head Circumference      Peak Flow      Pain Score      Pain Loc      Pain Edu?      Excl. in [place]?    No data found.  Updated Vital Signs BP (!) 150/74 (BP Location: Left Arm)   Pulse 98   Temp 98 F (36.7 C) (Oral)   Resp 16   Wt 112 lb (50.8 kg)   SpO2 96%   BMI 19.22 kg/m   Visual Acuity Right Eye Distance:   Left Eye Distance:   Bilateral Distance:    Right Eye Near:   Left Eye Near:    Bilateral Near:     Physical Exam Constitutional:      General: She is not in acute distress. HENT:     Head: Normocephalic and atraumatic.  Eyes:     General: No scleral icterus.    Pupils: Pupils are equal, round, and reactive to light.  Cardiovascular:     Rate and Rhythm: Normal rate.  Pulmonary:     Effort: Pulmonary effort is normal.  Musculoskeletal:     Comments: Full active ROM of left ankle and foot.  Neurovascularly intact.  Gait normal.  Diffuse foot tenderness, though predominantly at base of fifth MTP.  Skin:    Coloration: Skin is not jaundiced or pale.     Comments: Dorsal aspect of foot with jaundiced appearance and ecchymosis in between toes.  No open wound, discharge.  Neurological:     Mental Status: She is alert and oriented to person, place, and time.      UC Treatments / Results  Labs (all labs ordered are listed, but only abnormal results are displayed) Labs Reviewed - No data to  display  EKG   Radiology DG Foot Complete Left  Result Date: 01/15/2020 CLINICAL DATA:  Left foot pain x1 week. EXAM: LEFT FOOT - COMPLETE 3+ VIEW COMPARISON:  None. FINDINGS: Acute fracture is seen involving the mid to distal portion of the fifth left metatarsal. There is no evidence of dislocation. Mild degenerative changes are seen involving the metatarsophalangeal articulation of the left great toe. Mild soft tissue swelling is seen adjacent to the previously noted fracture site. IMPRESSION: Acute fracture of the fifth left metatarsal. Electronically Signed   By: Virgina Norfolk M.D.   On: 01/15/2020 16:05    Procedures Procedures (including critical care time)  Medications Ordered in UC Medications - No data to display  Initial Impression / Assessment and Plan / UC Course  I have reviewed the triage vital signs and the nursing notes.  Pertinent labs & imaging results that were available during my care of the patient were reviewed by me and considered in my medical decision making (see chart for details).     Patient appears well in office and is without neurocognitive deficit.  Hemodynamically stable without deformity.  X-ray of left foot done office, reviewed by me radiology: Acute fracture to fifth left metatarsal noted.  Reviewed findings with patient verbalized understanding.  Consulted with Dr. Griffin Basil of orthopedics who agrees with A/P: Patient given cam walker boot, can be weightbearing as tolerated, and will have follow-up this next week with him in office.  Return precautions discussed, patient verbalized understanding and is agreeable to plan. Final Clinical Impressions(s) / UC Diagnoses   Final diagnoses:  Displaced fracture of fifth metatarsal bone, left foot, initial encounter for closed fracture     Discharge Instructions     Recommend RICE: rest, ice, compression, elevation as needed for pain.    Heat therapy (hot compress, warm wash rag, hot showers, etc.)  can help relax muscles and soothe muscle aches. Cold therapy (ice packs) can be used to help swelling both after injury and after prolonged use of areas of chronic pain/aches.  For pain: Tylenol  Very important to follow-up with orthopedics for further evaluation and management. Do not put weight on your foot.  Wear boot at all times.    ED Prescriptions    None     PDMP not reviewed this encounter.   Hall[place], [place], [place] 01/15/20 1623

## 2020-01-18 DIAGNOSIS — S92355A Nondisplaced fracture of fifth metatarsal bone, left foot, initial encounter for closed fracture: Secondary | ICD-10-CM | POA: Diagnosis not present

## 2020-02-02 ENCOUNTER — Other Ambulatory Visit: Payer: Self-pay

## 2020-02-02 ENCOUNTER — Ambulatory Visit (INDEPENDENT_AMBULATORY_CARE_PROVIDER_SITE_OTHER): Payer: Medicare Other | Admitting: Physician Assistant

## 2020-02-02 ENCOUNTER — Encounter: Payer: Self-pay | Admitting: Physician Assistant

## 2020-02-02 VITALS — BP 138/79 | HR 70 | Ht 64.0 in | Wt 112.8 lb

## 2020-02-02 DIAGNOSIS — F39 Unspecified mood [affective] disorder: Secondary | ICD-10-CM

## 2020-02-02 DIAGNOSIS — G4700 Insomnia, unspecified: Secondary | ICD-10-CM | POA: Diagnosis not present

## 2020-02-02 MED ORDER — PAROXETINE HCL 10 MG PO TABS: 10.0000 mg | ORAL_TABLET | Freq: Every day | ORAL | 1 refills | Status: DC

## 2020-02-02 MED ORDER — BUSPIRONE HCL 7.5 MG PO TABS: ORAL_TABLET | ORAL | 1 refills | Status: DC

## 2020-02-02 NOTE — Progress Notes (Signed)
Telehealth office visit note for Michele Reid, PA-C- at Primary Care at [facility]   I connected with current patient today by telephone and verified that I am speaking with the correct person   . Location of the patient: Home . Location of the provider: Office - This visit type was conducted due to national recommendations for restrictions regarding the COVID-19 Pandemic (e.g. social distancing) in an effort to limit this patient's exposure and mitigate transmission in our community.    - No physical exam could be performed with this format, beyond that communicated to [place] by the patient/ family members as noted.   - Additionally my office staff/ schedulers were to discuss with the patient that there may be a monetary charge related to this service, depending on their medical insurance.  My understanding is that patient understood and consented to proceed.     _________________________________________________________________________________   History of Present Illness: Patient comes in to follow-up on mood- anxiety and depression.  Patient was started on Paxil 10 mg and was instructed to take 0.5 tablet. Patient reports tolerating 5 mg without issues. She does feel like medication has helped some, she does not cry all the time anymore. She continues to feel some low-grade depression and some anxiety.  She does have a strong family history of depression and anxiety. She does have a good support of friends. She reports compliance with BuSpar which helps with her insomnia and anxiety.  Sometimes she has problems going back to sleep when she wakes up in the middle of the night.  Reports staying busy with her job and other activities.     GAD 7 : Generalized Anxiety Score 11/03/2019 07/02/2019 07/01/2018 03/25/2018  Nervous, Anxious, on Edge 3 1 3 2   Control/stop worrying 3 3 3 1   Worry too much - different things 3 2 3 1   Trouble relaxing 3 2 3 1   Restless 0 0 2 1  Easily annoyed or  irritable 3 3 2 1   Afraid - awful might happen 3 3 3  0  Total GAD 7 Score 18 14 19 7   Anxiety Difficulty Somewhat difficult Not difficult at all Very difficult -    Depression screen [facility] 2/9 02/02/2020 11/03/2019 07/02/2019 11/04/2018 07/01/2018  Decreased Interest 1 2 3  0 2  Down, Depressed, Hopeless 1 3 3  0 3  PHQ - 2 Score 2 5 6  0 5  Altered sleeping 1 1 2  0 1  Tired, decreased energy 0 1 1 0 1  Change in appetite 0 0 0 0 0  Feeling bad or failure about yourself  1 3 2  0 2  Trouble concentrating 0 1 1 0 0  Moving slowly or fidgety/restless 0 0 0 0 0  Suicidal thoughts 0 0 0 0 0  PHQ-9 Score 4 11 12  0 9  Difficult doing work/chores Somewhat difficult Somewhat difficult Not difficult at all Not difficult at all Somewhat difficult      Impression and Recommendations:     1. 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 Insomnia, unspecified type     Mood disorder- with mixed anxiety and depression: -Stable, PHQ-9 score of 4 -Discussed with patient increasing Paxil to full dose of 10 mg and start taking 1 full tablet once daily.  Patient agreeable. Pt prefers to continue medication therapy and declined referral to psychology at this time. -Advised patient to let me know if not able  to tolerate to full dose of 10 mg, and will resume 5 mg. Patient verbalized understanding. -Continue BuSpar 7.5 mg 3 times daily. -Encouraged to continue doing enjoyable activities and use her good support system. -   Insomnia: -Stable -Continue BuSpar. -Encouraged to incorporate nonpharmacological therapy such as meditation and relaxation techniques. -Will continue to monitor.   - As part of my medical decision making, I reviewed the following data within the [place] History obtained from pt /family, CMA notes reviewed and incorporated if applicable, Labs reviewed, Radiograph/ tests reviewed if applicable and OV notes from prior  OV's with me, as well as any other specialists she/he has seen since seeing me last, were all reviewed and used in my medical decision making process today.    - Additionally, when appropriate, discussion had with patient regarding our treatment plan, and their biases/concerns about that plan were used in my medical decision making today.    - The patient agreed with the plan and demonstrated an understanding of the instructions.   No barriers to understanding were identified.     - The patient was advised to call back or seek an in-person evaluation if the symptoms worsen or if the condition fails to improve as anticipated.   Return in about 4 months (around 06/04/2020) for Mood management, Insomnia, HTN.    No orders of the defined types were placed in this encounter.   Meds ordered this encounter  Medications  . PARoxetine (PAXIL) 10 MG tablet    Sig: Take 1 tablet (10 mg total) by mouth daily.    Dispense:  90 tablet    Refill:  1    Order Specific Question:   Supervising Provider    Answer:   Beatrice Lecher D [[id number]]  . busPIRone (BUSPAR) 7.5 MG tablet    Sig: TAKE 1 TABLET BY MOUTH 3  TIMES DAILY    Dispense:  270 tablet    Refill:  1    Order Specific Question:   Supervising Provider    Answer:   Beatrice Lecher D [[id number]]    Medications Discontinued During This Encounter  Medication Reason  . busPIRone (BUSPAR) 7.5 MG tablet Reorder  . PARoxetine (PAXIL) 10 MG tablet Reorder       Time spent on visit including pre-visit chart review and post-visit care was 15 minutes.      The [place] was signed into law in 2016 which includes the topic of electronic health records.  This provides immediate access to information in MyChart.  This includes consultation notes, operative notes, office notes, lab results and pathology reports.  If you have any questions about what you read please let us know at your next visit or call us at the office.  We are  right here with you.   __________________________________________________________________________________     Patient Care Team    Relationship Specialty Notifications Start End  Michele Tyler, [place] PCP - General   11/28/19      -Vitals obtained; medications/ allergies reconciled;  personal medical, social, Sx etc.histories were updated by CMA, reviewed by me and are reflected in chart   Patient Active Problem List   Diagnosis Date Noted  . Iron deficiency 07/04/2019  . B12 deficiency 07/04/2019  . Lipoma 07/04/2019  . Fear of other medical care- signficant fears of drug S-E inhibiting pt's ability to take medications 07/02/2019  . Personal history of noncompliance with medical treatment, presenting hazards to health 05/08/2019  . Elevated  blood-pressure reading without diagnosis of hypertension-may be anxiety mediated 07/01/2018  . Insomnia 03/25/2018  . Elevated LDL cholesterol level 03/25/2018  . Hyperlipidemia 03/25/2018  . Hypertension, goal below 150/90 02/18/2018  . Mood disorder ([place]) 02/18/2018  . Generalized anxiety disorder 03/19/2013     Current Meds  Medication Sig  . Apple Cider Vinegar 600 MG CAPS Take 1 capsule by mouth daily.  . Ascorbic Acid (VITAMIN C CR) 1500 MG TBCR Take 1 tablet by mouth daily.  Marland Kitchen atorvastatin (LIPITOR) 10 MG tablet TAKE 1 TABLET BY MOUTH AT  BEDTIME  . B Complex-Folic Acid (BALANCED U-199 PO) Take by mouth.  Derrill Memo ON 02/15/2020] busPIRone (BUSPAR) 7.5 MG tablet TAKE 1 TABLET BY MOUTH 3  TIMES DAILY  . CALCIUM-MAGNESUIUM-ZINC 333-133-8.3 MG TABS Take 4 tablets by mouth daily.  . Cholecalciferol (VITAMIN D3) 5000 units CAPS Take 1 capsule by mouth daily.  . Cyanocobalamin (B-12) 1000 MCG CAPS Take 1 capsule by mouth.  . Ferrous Sulfate (IRON) 325 (65 Fe) MG TABS Take 1 tablet by mouth daily.  . folic acid (FOLVITE) 144 MCG tablet Take 800 mcg by mouth daily.  Marland Kitchen losartan-hydrochlorothiazide (HYZAAR) 100-25 MG tablet TAKE 1 TABLET BY  MOUTH  DAILY  . Omega-3 Fatty Acids (OMEGA-3 FISH OIL PO) Take 4,000 mg by mouth daily. Contains omega 3 450 mg and fish oil 1500 mg   . PARoxetine (PAXIL) 10 MG tablet Take 1 tablet (10 mg total) by mouth daily.  . Soy Isoflavone 750 MG CAPS Take 1 capsule by mouth daily.  . TURMERIC PO Take 800 mg by mouth.  . vitamin A 8000 UNIT capsule Take 8,000 Units by mouth daily.  Marland Kitchen VITAMIN E-1000 PO Take 1 capsule by mouth daily.  . Vitamins-Lipotropics (BALANCED B-150 COMPLEX TR PO) Take 1 tablet by mouth daily. Contains b1 150mg , b2 150mg , b6 150mg , b12 150mg , niacin 150 mg  . [DISCONTINUED] busPIRone (BUSPAR) 7.5 MG tablet TAKE 1 TABLET BY MOUTH 3  TIMES DAILY  . [DISCONTINUED] PARoxetine (PAXIL) 10 MG tablet Take 0.5 tablet daily.     Allergies:  Allergies  Allergen Reactions  . Codeine Nausea Only     ROS:  See above HPI for pertinent positives and negatives   Objective:   Blood pressure 138/79, pulse 70, height 5\' 4"  (1.626 m), weight 112 lb 12.8 oz (51.2 kg).  (if some vitals are omitted, this means that patient was UNABLE to obtain them even though they were asked to get them prior to OV today.  They were asked to call us at their earliest convenience with these once obtained. ) General: A & O * 3; sounds in no acute distress; in usual state of health.  Skin: Pt confirms warm and dry extremities and pink fingertips HEENT: Pt confirms lips non-cyanotic Chest: Patient confirms normal chest excursion and movement Respiratory: speaking in full sentences, no conversational dyspnea; patient confirms no use of accessory muscles Psych: insight appears good, mood- appears full

## 2020-02-22 DIAGNOSIS — S92355D Nondisplaced fracture of fifth metatarsal bone, left foot, subsequent encounter for fracture with routine healing: Secondary | ICD-10-CM | POA: Diagnosis not present

## 2020-02-23 ENCOUNTER — Other Ambulatory Visit: Payer: Self-pay | Admitting: Family Medicine

## 2020-02-23 ENCOUNTER — Other Ambulatory Visit: Payer: Self-pay | Admitting: Physician Assistant

## 2020-02-23 DIAGNOSIS — E785 Hyperlipidemia, unspecified: Secondary | ICD-10-CM

## 2020-02-23 DIAGNOSIS — I10 Essential (primary) hypertension: Secondary | ICD-10-CM

## 2020-03-29 ENCOUNTER — Telehealth: Payer: Self-pay | Admitting: Physician Assistant

## 2020-03-29 NOTE — Telephone Encounter (Signed)
Patient given below information from CDC:   When can I get a COVID-19 vaccine booster? Not immediately. The goal is for people to start receiving a COVID-19 booster shot beginning in the fall, with individuals being eligible starting 8 months after they received their second dose of an mRNA vaccine (either Pfizer-BioNTech or Moderna). This is subject to authorization by the U.S. Food and Drug Administration and recommendation by [employer] (ACIP). FDA is conducting an independent evaluation to determine the safety and effectiveness of a booster dose of the mRNA vaccines. ACIP will decide whether to issue a booster dose recommendation based on a thorough review of the evidence.  Who will be the first people to get a booster dose? If FDA authorizes and ACIP recommends a booster dose, the goal is for the first people eligible for a booster dose to be those who were the first to receive a COVID-19 vaccination (those who are most at risk). This includes healthcare providers, residents of long-term care facilities, and other older adults.  Why is the [place] waiting to start offering COVID-19 vaccine boosters? The COVID-19 vaccines authorized in the [place] continue to be highly effective in reducing risk of severe disease, hospitalization, and death, even against the widely circulating Delta variant. However, COVID-19 constantly evolves. Experts are looking at all available data to understand how well the vaccines are working, including how new variants, like Delta, affect vaccine effectiveness. If FDA authorizes and ACIP recommends it, the goal is for people to start receiving a COVID-19 booster shot this fall.  Can people who received [place] (J&J/Janssen) COVID-19 Vaccine get a booster dose of an mRNA vaccine? No, there aren't enough data currently to support getting an mRNA vaccine dose (either Pfizer-BioNTech or Moderna) if someone  has previously gotten a J&J/Janssen vaccine. People who got the J&J/Janssen vaccine will likely need a booster dose of the J&J/Janssen vaccine, and more data are expected in the coming weeks. With those data in hand, CDC will keep the public informed with a timely plan for J&J/Janssen booster shots.  Will people who received [place] (J&J/Janssen) COVID-19 Vaccine need a booster shot? It is likely that people who received a J&J COVID-19 vaccine will need a booster dose. Because the J&J/Janssen vaccine wasn't given in the [place] until 70 days after the first mRNA vaccine doses (Pfizer-BioNTech and Moderna), the data needed to make this decision aren't available yet. These data are expected in the coming weeks. With those data in hand, CDC will keep the public informed with a timely plan for J&J/Janssen booster shots.  If we need a booster dose, does that mean that the vaccines aren't working? No. COVID-19 vaccines are working very well to prevent severe illness, hospitalization, and death, even against the widely circulating Delta variant. However, with the Delta variant, public health experts are starting to see reduced protection against mild and moderate disease. For that reason, the U.S. Department of Health and Human Services [facility] [place]) is planning for a booster shot so vaccinated people maintain protection over the coming months.  What's the difference between a booster dose and an additional dose? Sometimes people who are moderately to severely immunocompromised do not build enough (or any) protection when they first get a vaccination. When this happens, getting another dose of the vaccine can sometimes help them build more protection against the disease. This appears to be the case for some immunocompromised people and COVID-19 vaccines. CDC recommends  moderately to severely immunocompromised people consider receiving an additional (third) dose of an mRNA COVID-19 vaccine  (Pfizer-BioNTech or Moderna) at least 28 days after the completion of the initial 2-dose mRNA COVID-19 vaccine series.  In contrast, a "booster dose" refers to another dose of a vaccine that is given to someone who built enough protection after vaccination, but then that protection decreased over time (this is called waning immunity). HHS has developed a plan to begin offering COVID-19 booster shots to people this fall. Implementation of the plan is subject to FDA's authorization and ACIP's recommendation.

## 2020-03-29 NOTE — Telephone Encounter (Signed)
Patient has some clinical questions about the COVID booster shot she wants to speak with the clinic staff about. Please contact when available.

## 2020-04-04 DIAGNOSIS — S92355D Nondisplaced fracture of fifth metatarsal bone, left foot, subsequent encounter for fracture with routine healing: Secondary | ICD-10-CM | POA: Diagnosis not present

## 2020-04-23 ENCOUNTER — Other Ambulatory Visit: Payer: Self-pay | Admitting: Physician Assistant

## 2020-04-23 DIAGNOSIS — E785 Hyperlipidemia, unspecified: Secondary | ICD-10-CM

## 2020-05-17 DIAGNOSIS — Z1231 Encounter for screening mammogram for malignant neoplasm of breast: Secondary | ICD-10-CM | POA: Diagnosis not present

## 2020-05-18 ENCOUNTER — Other Ambulatory Visit: Payer: Self-pay | Admitting: Physician Assistant

## 2020-05-18 DIAGNOSIS — E785 Hyperlipidemia, unspecified: Secondary | ICD-10-CM

## 2020-05-19 LAB — HM MAMMOGRAPHY

## 2020-06-07 ENCOUNTER — Other Ambulatory Visit: Payer: Self-pay

## 2020-06-07 ENCOUNTER — Encounter: Payer: Self-pay | Admitting: Physician Assistant

## 2020-06-07 ENCOUNTER — Ambulatory Visit (INDEPENDENT_AMBULATORY_CARE_PROVIDER_SITE_OTHER): Payer: Medicare Other | Admitting: Physician Assistant

## 2020-06-07 VITALS — Ht 64.0 in | Wt 113.0 lb

## 2020-06-07 DIAGNOSIS — F39 Unspecified mood [affective] disorder: Secondary | ICD-10-CM

## 2020-06-07 DIAGNOSIS — I10 Essential (primary) hypertension: Secondary | ICD-10-CM

## 2020-06-07 DIAGNOSIS — G4700 Insomnia, unspecified: Secondary | ICD-10-CM

## 2020-06-07 DIAGNOSIS — E785 Hyperlipidemia, unspecified: Secondary | ICD-10-CM | POA: Diagnosis not present

## 2020-06-07 MED ORDER — BUSPIRONE HCL 7.5 MG PO TABS: ORAL_TABLET | ORAL | 1 refills | Status: DC

## 2020-06-07 MED ORDER — LOSARTAN POTASSIUM-HCTZ 100-25 MG PO TABS: 1.0000 | ORAL_TABLET | Freq: Every day | ORAL | 1 refills | Status: DC

## 2020-06-07 MED ORDER — PAROXETINE HCL 10 MG PO TABS: 10.0000 mg | ORAL_TABLET | Freq: Every day | ORAL | 1 refills | Status: DC

## 2020-06-07 NOTE — Progress Notes (Signed)
Telehealth office visit note for Michele Reid, PA-C- at Primary Care at [facility]   I connected with current patient today by telephone and verified that I am speaking with the correct person   . Location of the patient: Home . Location of the provider: Office - This visit type was conducted due to national recommendations for restrictions regarding the COVID-19 Pandemic (e.g. social distancing) in an effort to limit this patient's exposure and mitigate transmission in our community.    - No physical exam could be performed with this format, beyond that communicated to [place] by the patient/ family members as noted.   - Additionally my office staff/ schedulers were to discuss with the patient that there may be a monetary charge related to this service, depending on their medical insurance.  My understanding is that patient understood and consented to proceed.     _________________________________________________________________________________   History of Present Illness: Patient calls in to follow up on hypertension, insomnia and mood management. Has no acute concerns today.  HTN: Pt denies chest pain, palpitations, dizziness or lower extremity swelling. Taking medication as directed without side effects. Does not check BP at home routinely. BP today is 142/84, pulse 74, temp 97.1. Pt follows a low salt diet.  Mood: States her mood is about the same. Patient has a history of medication resistance and states Paxil is one that she has been able to tolerate and helps improve mood. Some days she takes 5 mg and other days she takes 10 mg. Has noticed Paxil can provoke some irritability at times. Taking Buspar helps. Patient works at a [employer] and states her job is becoming stressful.   Insomnia: Sleep is stable. Sometimes is unable to stay asleep.   HLD: Pt taking medication as directed without issues. Denies side effects. Reports has reduced processed foods.     GAD 7 :  Generalized Anxiety Score 06/07/2020 11/03/2019 07/02/2019 07/01/2018  Nervous, Anxious, on Edge 2 3 1 3   Control/stop worrying 2 3 3 3   Worry too much - different things 2 3 2 3   Trouble relaxing 2 3 2 3   Restless 1 0 0 2  Easily annoyed or irritable 1 3 3 2   Afraid - awful might happen 3 3 3 3   Total GAD 7 Score 13 18 14 19   Anxiety Difficulty Somewhat difficult Somewhat difficult Not difficult at all Very difficult    Depression screen [facility] 2/9 06/07/2020 02/02/2020 11/03/2019 07/02/2019 11/04/2018  Decreased Interest 2 1 2 3  0  Down, Depressed, Hopeless 2 1 3 3  0  PHQ - 2 Score 4 2 5 6  0  Altered sleeping 2 1 1 2  0  Tired, decreased energy 0 0 1 1 0  Change in appetite 0 0 0 0 0  Feeling bad or failure about yourself  2 1 3 2  0  Trouble concentrating 2 0 1 1 0  Moving slowly or fidgety/restless 0 0 0 0 0  Suicidal thoughts 0 0 0 0 0  PHQ-9 Score 10 4 11 12  0  Difficult doing work/chores Somewhat difficult Somewhat difficult Somewhat difficult Not difficult at all Not difficult at all      Impression and Recommendations:     1. 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 Insomnia, unspecified type   3. Hypertension, goal below 150/90   4. Hyperlipidemia, unspecified hyperlipidemia type     Mood disorder with mixed  anxiety depression, resistant to many medications for many years: -PHQ-9 score of 10 and GAD-7 score of 13, denies SI/HI. -Continue current medication regimen. -Patient prefers to stay on current medication regimen than to consider medication adjustments. -Will continue to monitor.  Insomnia: -Stable -Recommend to limit/avoid caffeine, listen to the calm app or nature sounds, and establish a good sleep hygiene. -Will continue to monitor.  Hypertension: -BP mildly above goal. -Continue current medication regimen. -Recommend ambulatory BP and pulse monitoring few times/wk. -Follow a low sodium diet and stay  well hydrated. -Will continue to monitor.  Hyperlipidemia: -Last lipid panel wnl, LDL 74 -Continue current medication regimen. -Follow a heart healthy diet and stay as active as possible. -Advised to schedule a lab visit for FBW in December.   - As part of my medical decision making, I reviewed the following data within the [place] History obtained from pt /family, CMA notes reviewed and incorporated if applicable, Labs reviewed, Radiograph/ tests reviewed if applicable and OV notes from prior OV's with me, as well as any other specialists she/he has seen since seeing me last, were all reviewed and used in my medical decision making process today.    - Additionally, when appropriate, discussion had with patient regarding our treatment plan, and their biases/concerns about that plan were used in my medical decision making today.    - The patient agreed with the plan and demonstrated an understanding of the instructions.   No barriers to understanding were identified.     - The patient was advised to call back or seek an in-person evaluation if the symptoms worsen or if the condition fails to improve as anticipated.   Return in about 4 months (around 10/05/2020) for [facility]; lab visit for FBW (include B12, iron panel)  in Dec.    No orders of the defined types were placed in this encounter.   Meds ordered this encounter  Medications  . busPIRone (BUSPAR) 7.5 MG tablet    Sig: TAKE 1 TABLET BY MOUTH 3  TIMES DAILY    Dispense:  270 tablet    Refill:  1    Order Specific Question:   Supervising Provider    Answer:   Beatrice Lecher D [[id number]]  . losartan-hydrochlorothiazide (HYZAAR) 100-25 MG tablet    Sig: Take 1 tablet by mouth daily.    Dispense:  90 tablet    Refill:  1    Requesting 1 year supply    Order Specific Question:   Supervising Provider    Answer:   Beatrice Lecher D [[id number]]  . PARoxetine (PAXIL) 10 MG tablet    Sig: Take 1 tablet (10 mg total)  by mouth daily.    Dispense:  90 tablet    Refill:  1    Order Specific Question:   Supervising Provider    Answer:   Beatrice Lecher D [[id number]]    Medications Discontinued During This Encounter  Medication Reason  . losartan-hydrochlorothiazide (HYZAAR) 100-25 MG tablet Reorder  . PARoxetine (PAXIL) 10 MG tablet Reorder  . busPIRone (BUSPAR) 7.5 MG tablet Reorder       Time spent on visit including pre-visit chart review and post-visit care was 15 minutes.      The [place] was signed into law in 2016 which includes the topic of electronic health records.  This provides immediate access to information in MyChart.  This includes consultation notes, operative notes, office notes, lab results and pathology reports.  If  you have any questions about what you read please let us know at your next visit or call us at the office.  We are right here with you.  Note:  This note was prepared with assistance of Dragon voice recognition software. Occasional wrong-word or sound-a-like substitutions may have occurred due to the inherent limitations of voice recognition software.  __________________________________________________________________________________     Patient Care Team    Relationship Specialty Notifications Start End  Michele Tyler, [place] PCP - General   11/28/19      -Vitals obtained; medications/ allergies reconciled;  personal medical, social, Sx etc.histories were updated by CMA, reviewed by me and are reflected in chart   Patient Active Problem List   Diagnosis Date Noted  . Iron deficiency 07/04/2019  . B12 deficiency 07/04/2019  . Lipoma 07/04/2019  . Fear of other medical care- signficant fears of drug S-E inhibiting pt's ability to take medications 07/02/2019  . Personal history of noncompliance with medical treatment, presenting hazards to health 05/08/2019  . Elevated blood-pressure reading without diagnosis of hypertension-may be anxiety  mediated 07/01/2018  . Insomnia 03/25/2018  . Elevated LDL cholesterol level 03/25/2018  . Hyperlipidemia 03/25/2018  . Hypertension, goal below 150/90 02/18/2018  . Mood disorder ([place]) 02/18/2018  . Generalized anxiety disorder 03/19/2013     Current Meds  Medication Sig  . Apple Cider Vinegar 600 MG CAPS Take 1 capsule by mouth daily.  . Ascorbic Acid (VITAMIN C CR) 1500 MG TBCR Take 1 tablet by mouth daily.  Marland Kitchen atorvastatin (LIPITOR) 10 MG tablet TAKE 1 TABLET BY MOUTH AT  BEDTIME  . B Complex-Folic Acid (BALANCED L-875 PO) Take by mouth.  . busPIRone (BUSPAR) 7.5 MG tablet TAKE 1 TABLET BY MOUTH 3  TIMES DAILY  . CALCIUM-MAGNESUIUM-ZINC 333-133-8.3 MG TABS Take 4 tablets by mouth daily.  . Cholecalciferol (VITAMIN D3) 5000 units CAPS Take 1 capsule by mouth daily.  . Cyanocobalamin (B-12) 1000 MCG CAPS Take 1 capsule by mouth.  . Ferrous Sulfate (IRON) 325 (65 Fe) MG TABS Take 1 tablet by mouth daily.  . folic acid (FOLVITE) 643 MCG tablet Take 800 mcg by mouth daily. [place] losartan-hydrochlorothiazide (HYZAAR) 100-25 MG tablet Take 1 tablet by mouth daily.  . Omega-3 Fatty Acids (OMEGA-3 FISH OIL PO) Take 4,000 mg by mouth daily. Contains omega 3 450 mg and fish oil 1500 mg   . PARoxetine (PAXIL) 10 MG tablet Take 1 tablet (10 mg total) by mouth daily.  . Soy Isoflavone 750 MG CAPS Take 1 capsule by mouth daily.  . TURMERIC PO Take 800 mg by mouth.  . vitamin A 8000 UNIT capsule Take 8,000 Units by mouth daily.  Marland Kitchen VITAMIN E-1000 PO Take 1 capsule by mouth daily.  . Vitamins-Lipotropics (BALANCED B-150 COMPLEX TR PO) Take 1 tablet by mouth daily. Contains b1 150mg , b2 150mg , b6 150mg , b12 150mg , niacin 150 mg  . [DISCONTINUED] busPIRone (BUSPAR) 7.5 MG tablet TAKE 1 TABLET BY MOUTH 3  TIMES DAILY  . [DISCONTINUED] losartan-hydrochlorothiazide (HYZAAR) 100-25 MG tablet TAKE 1 TABLET BY MOUTH  DAILY  . [DISCONTINUED] PARoxetine (PAXIL) 10 MG tablet Take 1 tablet (10 mg total)  by mouth daily.     Allergies:  Allergies  Allergen Reactions  . Codeine Nausea Only     ROS:  See above HPI for pertinent positives and negatives   Objective:   Height 5\' 4"  (1.626 m), weight 113 lb (51.3 kg).  (if some vitals are omitted, this  means that patient was UNABLE to obtain them even though they were asked to get them prior to [place] today.) General: A & O * 3; sounds in no acute distress Respiratory: speaking in full sentences, no conversational dyspnea Psych: insight appears good, mood- appears full

## 2020-07-03 ENCOUNTER — Other Ambulatory Visit: Payer: Self-pay | Admitting: Physician Assistant

## 2020-07-03 DIAGNOSIS — E785 Hyperlipidemia, unspecified: Secondary | ICD-10-CM

## 2020-07-03 DIAGNOSIS — E7800 Pure hypercholesterolemia, unspecified: Secondary | ICD-10-CM

## 2020-07-03 DIAGNOSIS — E611 Iron deficiency: Secondary | ICD-10-CM

## 2020-07-03 DIAGNOSIS — E538 Deficiency of other specified B group vitamins: Secondary | ICD-10-CM

## 2020-07-03 DIAGNOSIS — I10 Essential (primary) hypertension: Secondary | ICD-10-CM

## 2020-07-03 DIAGNOSIS — Z0000 Encounter for general adult medical examination without abnormal findings: Secondary | ICD-10-CM

## 2020-07-05 ENCOUNTER — Other Ambulatory Visit: Payer: Self-pay

## 2020-07-05 ENCOUNTER — Other Ambulatory Visit: Payer: Medicare Other

## 2020-07-05 DIAGNOSIS — E538 Deficiency of other specified B group vitamins: Secondary | ICD-10-CM

## 2020-07-05 DIAGNOSIS — Z0000 Encounter for general adult medical examination without abnormal findings: Secondary | ICD-10-CM

## 2020-07-05 DIAGNOSIS — I10 Essential (primary) hypertension: Secondary | ICD-10-CM

## 2020-07-05 DIAGNOSIS — E611 Iron deficiency: Secondary | ICD-10-CM

## 2020-07-05 DIAGNOSIS — E7800 Pure hypercholesterolemia, unspecified: Secondary | ICD-10-CM

## 2020-07-05 DIAGNOSIS — E785 Hyperlipidemia, unspecified: Secondary | ICD-10-CM

## 2020-07-05 NOTE — Addendum Note (Signed)
Addended by: Fonnie Mu on: 07/05/2020 08:25 AM   Modules accepted: Orders

## 2020-07-06 LAB — CBC
Hematocrit: 39 % (ref 34.0–46.6)
Hemoglobin: 13.3 g/dL (ref 11.1–15.9)
MCH: 30.2 pg (ref 26.6–33.0)
MCHC: 34.1 g/dL (ref 31.5–35.7)
MCV: 89 fL (ref 79–97)
Platelets: 408 10*3/uL (ref 150–450)
RBC: 4.4 x10E6/uL (ref 3.77–5.28)
RDW: 12.5 % (ref 11.7–15.4)
WBC: 4 10*3/uL (ref 3.4–10.8)

## 2020-07-06 LAB — IRON,TIBC AND FERRITIN PANEL
Ferritin: 282 ng/mL — ABNORMAL HIGH (ref 15–150)
Iron Saturation: 41 % (ref 15–55)
Iron: 118 ug/dL (ref 27–139)
Total Iron Binding Capacity: 291 ug/dL (ref 250–450)
UIBC: 173 ug/dL (ref 118–369)

## 2020-07-06 LAB — TSH: TSH: 0.501 u[IU]/mL (ref 0.450–4.500)

## 2020-07-06 LAB — B12 AND FOLATE PANEL
Folate: >20 ng/mL (ref >3.0)
Vitamin B-12: 773 pg/mL (ref 232–1245)

## 2020-07-06 LAB — HEMOGLOBIN A1C
Est. average glucose Bld gHb Est-mCnc: 108 mg/dL
Hgb A1c MFr Bld: 5.4 % (ref 4.8–5.6)

## 2020-07-06 LAB — COMPREHENSIVE METABOLIC PANEL
ALT: 21 IU/L (ref 0–32)
AST: 23 IU/L (ref 0–40)
Albumin/Globulin Ratio: 2.5 — ABNORMAL HIGH (ref 1.2–2.2)
Albumin: 4.8 g/dL — ABNORMAL HIGH (ref 3.7–4.7)
Alkaline Phosphatase: 88 IU/L (ref 44–121)
BUN/Creatinine Ratio: 17 (ref 12–28)
BUN: 10 mg/dL (ref 8–27)
Bilirubin Total: 0.6 mg/dL (ref 0.0–1.2)
CO2: 25 mmol/L (ref 20–29)
Calcium: 9.5 mg/dL (ref 8.7–10.3)
Chloride: 93 mmol/L — ABNORMAL LOW (ref 96–106)
Creatinine, Ser: 0.58 mg/dL (ref 0.57–1.00)
GFR calc Af Amer: 106 mL/min/{1.73_m2} (ref >59)
GFR calc non Af Amer: 92 mL/min/{1.73_m2} (ref >59)
Globulin, Total: 1.9 g/dL (ref 1.5–4.5)
Glucose: 90 mg/dL (ref 65–99)
Potassium: 3.9 mmol/L (ref 3.5–5.2)
Sodium: 132 mmol/L — ABNORMAL LOW (ref 134–144)
Total Protein: 6.7 g/dL (ref 6.0–8.5)

## 2020-07-06 LAB — LIPID PANEL
Chol/HDL Ratio: 1.9 ratio (ref 0.0–4.4)
Cholesterol, Total: 188 mg/dL (ref 100–199)
HDL: 99 mg/dL (ref >39)
LDL Chol Calc (NIH): 80 mg/dL (ref 0–99)
Triglycerides: 43 mg/dL (ref 0–149)
VLDL Cholesterol Cal: 9 mg/dL (ref 5–40)

## 2020-07-06 LAB — TRANSFERRIN: Transferrin: 263 mg/dL (ref 192–364)

## 2020-08-11 ENCOUNTER — Other Ambulatory Visit: Payer: Self-pay | Admitting: Physician Assistant

## 2020-08-11 DIAGNOSIS — E785 Hyperlipidemia, unspecified: Secondary | ICD-10-CM

## 2020-10-04 ENCOUNTER — Telehealth: Payer: Self-pay | Admitting: Physician Assistant

## 2020-10-04 DIAGNOSIS — I10 Essential (primary) hypertension: Secondary | ICD-10-CM

## 2020-10-04 MED ORDER — LOSARTAN POTASSIUM-HCTZ 100-25 MG PO TABS: 1.0000 | ORAL_TABLET | Freq: Every day | ORAL | 1 refills | Status: DC

## 2020-10-04 NOTE — Telephone Encounter (Signed)
Refill sent to pharmacy. AS, CMA 

## 2020-10-05 ENCOUNTER — Telehealth: Payer: Self-pay | Admitting: Physician Assistant

## 2020-10-05 DIAGNOSIS — I10 Essential (primary) hypertension: Secondary | ICD-10-CM

## 2020-10-05 MED ORDER — LOSARTAN POTASSIUM-HCTZ 100-25 MG PO TABS: 1.0000 | ORAL_TABLET | Freq: Every day | ORAL | 0 refills | Status: DC

## 2020-10-05 NOTE — Addendum Note (Signed)
Addended by: Mickel Crow on: 10/05/2020 11:47 AM   Modules accepted: Orders

## 2020-10-05 NOTE — Telephone Encounter (Signed)
Patient's medication hyzaar was sent through [facility]. Patient will not get the refill in time as it was submitted last night. She only has one pill left. She would like a week of pills to hold her over until her medication arrives with [facility]. [facility] on [address] for the temporary prescription. Thanks

## 2020-10-05 NOTE — Telephone Encounter (Signed)
15 tabs sent to requested pharmacy. AS< CMA

## 2020-10-11 ENCOUNTER — Encounter: Payer: Self-pay | Admitting: Physician Assistant

## 2020-10-11 ENCOUNTER — Other Ambulatory Visit: Payer: Self-pay

## 2020-10-11 ENCOUNTER — Ambulatory Visit (INDEPENDENT_AMBULATORY_CARE_PROVIDER_SITE_OTHER): Payer: Medicare Other | Admitting: Physician Assistant

## 2020-10-11 VITALS — BP 108/65 | HR 71 | Temp 97.8°F | Ht 64.0 in | Wt 119.7 lb

## 2020-10-11 DIAGNOSIS — Z1211 Encounter for screening for malignant neoplasm of colon: Secondary | ICD-10-CM

## 2020-10-11 DIAGNOSIS — Z0000 Encounter for general adult medical examination without abnormal findings: Secondary | ICD-10-CM | POA: Diagnosis not present

## 2020-10-11 DIAGNOSIS — F39 Unspecified mood [affective] disorder: Secondary | ICD-10-CM

## 2020-10-11 MED ORDER — VILAZODONE HCL 10 MG PO TABS: 10.0000 mg | ORAL_TABLET | Freq: Every day | ORAL | 0 refills | Status: DC

## 2020-10-11 NOTE — Progress Notes (Signed)
Subjective:   Michele Tyler is a 75 y.o. female who presents for Medicare Annual (Subsequent) preventive examination.  Review of Systems    General:   No F/C, wt loss Pulm:   No DIB, SOB, pleuritic chest pain Card:  No CP, palpitations Abd:  No n/v/d or pain Ext:  No inc edema from baseline    Objective:    Today's Vitals   10/11/20 0902  BP: 108/65  Pulse: 71  Temp: 97.8 F (36.6 C)  SpO2: 97%  Weight: 119 lb 11.2 oz (54.3 kg)  Height: 5\' 4"  (1.626 m)   Body mass index is 20.55 kg/m.  Advanced Directives 03/25/2018 07/04/2015  Does Patient Have a Medical Advance Directive? No No  Would patient like information on creating a medical advance directive? No - Patient declined No - patient declined information    Current Medications (verified) Outpatient Encounter Medications as of 10/11/2020  Medication Sig  . Apple Cider Vinegar 600 MG CAPS Take 1 capsule by mouth daily.  . Ascorbic Acid (VITAMIN C CR) 1500 MG TBCR Take 1 tablet by mouth daily.  Marland Kitchen atorvastatin (LIPITOR) 10 MG tablet TAKE 1 TABLET BY MOUTH AT  BEDTIME  . B Complex-Folic Acid (BALANCED T-419 PO) Take by mouth.  . busPIRone (BUSPAR) 7.5 MG tablet TAKE 1 TABLET BY MOUTH 3  TIMES DAILY  . CALCIUM-MAGNESUIUM-ZINC 333-133-8.3 MG TABS Take 4 tablets by mouth daily.  . Cholecalciferol (VITAMIN D3) 5000 units CAPS Take 1 capsule by mouth daily.  . Cyanocobalamin (B-12) 1000 MCG CAPS Take 1 capsule by mouth.  . Ferrous Sulfate (IRON) 325 (65 Fe) MG TABS Take 1 tablet by mouth daily.  . folic acid (FOLVITE) 622 MCG tablet Take 800 mcg by mouth daily. [place] losartan-hydrochlorothiazide (HYZAAR) 100-25 MG tablet Take 1 tablet by mouth daily.  . Omega-3 Fatty Acids (OMEGA-3 FISH OIL PO) Take 4,000 mg by mouth daily. Contains omega 3 450 mg and fish oil 1500 mg  . Soy Isoflavone 750 MG CAPS Take 1 capsule by mouth daily.  . TURMERIC PO Take 800 mg by mouth.  . Vilazodone HCl (VIIBRYD) 10 MG TABS Take 1 tablet  (10 mg total) by mouth daily.  . vitamin A 8000 UNIT capsule Take 8,000 Units by mouth daily.  Marland Kitchen VITAMIN E-1000 PO Take 1 capsule by mouth daily.  . Vitamins-Lipotropics (BALANCED B-150 COMPLEX TR PO) Take 1 tablet by mouth daily. Contains b1 150mg , b2 150mg , b6 150mg , b12 150mg , niacin 150 mg  . [DISCONTINUED] PARoxetine (PAXIL) 10 MG tablet Take 1 tablet (10 mg total) by mouth daily.   No facility-administered encounter medications on file as of 10/11/2020.    Allergies (verified) Codeine   History: Past Medical History:  Diagnosis Date  . Anxiety   . Depression   . Hypertension    Past Surgical History:  Procedure Laterality Date  . DILATION AND CURETTAGE OF UTERUS  2002  . FACELIFT  2005   Family History  Problem Relation Age of Onset  . Hypertension Mother   . Depression Father   . Hypertension Father    Social History   Socioeconomic History  . Marital status: Divorced    Spouse name: Not on file  . Number of children: Not on file  . Years of education: Not on file  . Highest education level: Not on file  Occupational History  . Not on file  Tobacco Use  . Smoking status: Former Smoker    Packs/day:  1.00    Types: Cigarettes    Quit date: 1988    Years since quitting: 34.2  . Smokeless tobacco: Never Used  Vaping Use  . Vaping Use: Never used  Substance and Sexual Activity  . Alcohol use: Yes    Alcohol/week: 1.0 - 2.0 standard drink    Types: 1 - 2 Standard drinks or equivalent per week    Comment: social  . Drug use: No  . Sexual activity: Not Currently  Other Topics Concern  . Not on file  Social History Narrative  . Not on file   Social Determinants of Health   Financial Resource Strain: Not on file  Food Insecurity: Not on file  Transportation Needs: Not on file  Physical Activity: Not on file  Stress: Not on file  Social Connections: Not on file    Tobacco Counseling Counseling given: Not Answered   Diabetic? No   Activities of  Daily Living In your present state of health, do you have any difficulty performing the following activities: 10/11/2020 06/07/2020  Hearing? N N  Vision? N N  Difficulty concentrating or making decisions? Tempie Donning  Walking or climbing stairs? N N  Dressing or bathing? N N  Doing errands, shopping? N N  Some recent data might be hidden    Patient Care Team: Lorrene Reid, PA-C as PCP - General  Indicate any recent Medical Services you may have received from other than [facility] providers in the past year (date may be approximate).     Assessment:   This is a routine wellness examination for [place].  Hearing/Vision screen No exam data present  Dietary issues and exercise activities discussed: -Follow a heart healthy diet and continue with walking regimen.  Goals   None    Depression Screen PHQ 2/9 Scores 10/11/2020 06/07/2020 02/02/2020 11/03/2019 07/02/2019 11/04/2018 07/01/2018  PHQ - 2 Score 6 4 2 5 6  0 5  PHQ- 9 Score 11 10 4 11 12  0 9    Fall Risk Fall Risk  10/11/2020 06/07/2020 02/02/2020 11/04/2018 02/18/2018  Falls in the past year? 1 0 0 0 No  Number falls in past yr: 0 - - - -  Injury with Fall? 1 - - - -  Risk for fall due to : No Fall Risks - No Fall Risks - -  Follow up - Falls evaluation completed Falls evaluation completed Falls evaluation completed -    FALL RISK PREVENTION PERTAINING TO THE HOME:  Any stairs in or around the home? Yes  If so, are there any without handrails? Yes  Home free of loose throw rugs in walkways, pet beds, electrical cords, etc? Yes  Adequate lighting in your home to reduce risk of falls? Yes   ASSISTIVE DEVICES UTILIZED TO PREVENT FALLS:  Life alert? No  Use of a cane, walker or w/c? No  Grab bars in the bathroom? No  Shower chair or bench in shower? No  Elevated toilet seat or a handicapped toilet? No   TIMED UP AND GO:  Was the test performed? Yes .  Length of time to ambulate 10 feet: 18 sec.   Gait slow and steady without use  of assistive device  Cognitive Function: wnl's   6CIT Screen 10/11/2020  What Year? 0 points  What month? 0 points  What time? 0 points  Count back from 20 0 points  Months in reverse 0 points  Repeat phrase 0 points  Total Score 0    Immunizations Immunization  History  Administered Date(s) Administered  . DTaP 09/18/2006  . Fluad Quad(high Dose 65+) 04/28/2019  . Influenza Split 06/21/2013  . Influenza, High Dose Seasonal PF 06/12/2015, 08/25/2016, 05/14/2017, 05/21/2018  . Influenza, Seasonal, Injecte, Preservative Fre 05/10/2014  . Influenza-Unspecified 05/22/2020  . PFIZER(Purple Top)SARS-COV-2 Vaccination 09/12/2019, 10/05/2019, 05/03/2020  . Pneumococcal Polysaccharide-23 09/07/2013  . Tdap 07/04/2015    TDAP status: Up to date  Flu Vaccine status: Up to date Will request records from [place]  Pneumococcal vaccine status: Up to date Will request records from [facility] vaccine status: Completed vaccines  Qualifies for Shingles Vaccine? Yes   Zostavax completed No   Shingrix Completed?: No.    Education has been provided regarding the importance of this vaccine. Patient has been advised to call insurance company to determine out of pocket expense if they have not yet received this vaccine. Advised may also receive vaccine at local pharmacy or Health Dept. Verbalized acceptance and understanding.  Screening Tests Health Maintenance  Topic Date Due  . Hepatitis C Screening  Never done  . PNA vac Low Risk Adult (2 of 2 - PCV13) 09/07/2014  . MAMMOGRAM  04/24/2019  . TETANUS/TDAP  07/03/2025  . COLONOSCOPY (Pts 45-59yrs Insurance coverage will need to be confirmed)  03/31/2028  . INFLUENZA VACCINE  Completed  . DEXA SCAN  Completed  . COVID-19 Vaccine  Completed  . HPV VACCINES  Aged Out    Health Maintenance  Health Maintenance Due  Topic Date Due  . Hepatitis C Screening  Never done  . PNA vac Low Risk Adult (2 of 2 -  PCV13) 09/07/2014  . MAMMOGRAM  04/24/2019    Colorectal cancer screening: Type of screening: Cologuard. Completed 03/31/2018. Repeat every 3 years  Mammogram status: Completed 05/17/20. Repeat every year  Bone Density status: Completed 2015. Results reflect: Bone density results: OSTEOPENIA. Repeat every 3 years. Pt declined   Lung Cancer Screening: (Low Dose CT Chest recommended if Age 74-80 years, 30 pack-year currently smoking OR have quit w/in 15years.) does not qualify.   Lung Cancer Screening Referral: n/a  Additional Screening:  Hepatitis C Screening: does qualify; Completed pt declined  Vision Screening: Recommended annual ophthalmology exams for early detection of glaucoma and other disorders of the eye. Is the patient up to date with their annual eye exam?  Yes  Who is the provider or what is the name of the office in which the patient attends annual eye exams?  If pt is not established with a provider, would they like to be referred to a provider to establish care? No .   Dental Screening: Recommended annual dental exams for proper oral hygiene  Community Resource Referral / Chronic Care Management: CRR required this visit?  No   CCM required this visit?  No      Plan:  -PHQ-9 score of 11 today. Patient has failed multiple treatment therapies (Zoloft, Celexa, Lexapro, Prozac, Wellbutrin, Nortriptyline) and is interested to trial different antidepressant to improve mood/depression so will start vilazodone 10 mg. Patient reports is taking 0.5 tablet of paxil 10 mg. Discussed tapering instructions (take 5 mg every other day x 1 week then take every 3rd day x 1 wk, then start new medication). Will notify if medication not covered/approved by insurance. Follow up in 4 weeks to reassess symptoms and medication therapy.   I have personally reviewed and noted the following in the patient's chart:   . Medical and social history .  Use of alcohol, tobacco or illicit drugs   . Current medications and supplements . Functional ability and status . Nutritional status . Physical activity . Advanced directives . List of other physicians . Hospitalizations, surgeries, and ER visits in previous 12 months . Vitals . Screenings to include cognitive, depression, and falls . Referrals and appointments  In addition, I have reviewed and discussed with patient certain preventive protocols, quality metrics, and best practice recommendations. A written personalized care plan for preventive services as well as general preventive health recommendations were provided to patient.     Lorrene Reid, PA-C   10/11/2020

## 2020-10-11 NOTE — Patient Instructions (Signed)
Preventive Care 74 Years and Older, Female Preventive care refers to lifestyle choices and visits with your health care provider that can promote health and wellness. This includes:  A yearly physical exam. This is also called an annual wellness visit.  Regular dental and eye exams.  Immunizations.  Screening for certain conditions.  Healthy lifestyle choices, such as: ? Eating a healthy diet. ? Getting regular exercise. ? Not using drugs or products that contain nicotine and tobacco. ? Limiting alcohol use. What can I expect for my preventive care visit? Physical exam Your health care provider will check your:  Height and weight. These may be used to calculate your BMI (body mass index). BMI is a measurement that tells if you are at a healthy weight.  Heart rate and blood pressure.  Body temperature.  Skin for abnormal spots. Counseling Your health care provider may ask you questions about your:  Past medical problems.  Family's medical history.  Alcohol, tobacco, and drug use.  Emotional well-being.  Home life and relationship well-being.  Sexual activity.  Diet, exercise, and sleep habits.  History of falls.  Memory and ability to understand (cognition).  Work and work Statistician.  Pregnancy and menstrual history.  Access to firearms. What immunizations do I need? Vaccines are usually given at various ages, according to a schedule. Your health care provider will recommend vaccines for you based on your age, medical history, and lifestyle or other factors, such as travel or where you work.   What tests do I need? Blood tests  Lipid and cholesterol levels. These may be checked every 5 years, or more often depending on your overall health.  Hepatitis C test.  Hepatitis B test. Screening  Lung cancer screening. You may have this screening every year starting at age 74 if you have a 30-pack-year history of smoking and currently smoke or have quit within  the past 15 years.  Colorectal cancer screening. ? All adults should have this screening starting at age 74 and continuing until age 74. ? Your health care provider may recommend screening at age 74 if you are at increased risk. ? You will have tests every 1-10 years, depending on your results and the type of screening test.  Diabetes screening. ? This is done by checking your blood sugar (glucose) after you have not eaten for a while (fasting). ? You may have this done every 1-3 years.  Mammogram. ? This may be done every 1-2 years. ? Talk with your health care provider about how often you should have regular mammograms.  Abdominal aortic aneurysm (AAA) screening. You may need this if you are a current or former smoker.  BRCA-related cancer screening. This may be done if you have a family history of breast, ovarian, tubal, or peritoneal cancers. Other tests  STD (sexually transmitted disease) testing, if you are at risk.  Bone density scan. This is done to screen for osteoporosis. You may have this done starting at age 74. Talk with your health care provider about your test results, treatment options, and if necessary, the need for more tests. Follow these instructions at home: Eating and drinking  Eat a diet that includes fresh fruits and vegetables, whole grains, lean protein, and low-fat dairy products. Limit your intake of foods with high amounts of sugar, saturated fats, and salt.  Take vitamin and mineral supplements as recommended by your health care provider.  Do not drink alcohol if your health care provider tells you not to drink.  If you drink alcohol: ? Limit how much you have to 0-1 drink a day. ? Be aware of how much alcohol is in your drink. In the [place] one drink equals one 12 oz bottle of beer (355 mL), one 5 oz glass of wine (148 mL), or one 1 oz glass of hard liquor (44 mL).   Lifestyle  Take daily care of your teeth and gums. Brush your teeth every morning  and night with fluoride toothpaste. Floss one time each day.  Stay active. Exercise for at least 30 minutes 5 or more days each week.  Do not use any products that contain nicotine or tobacco, such as cigarettes, e-cigarettes, and chewing tobacco. If you need help quitting, ask your health care provider.  Do not use drugs.  If you are sexually active, practice safe sex. Use a condom or other form of protection in order to prevent STIs (sexually transmitted infections).  Talk with your health care provider about taking a low-dose aspirin or statin.  Find healthy ways to cope with stress, such as: ? Meditation, yoga, or listening to music. ? Journaling. ? Talking to a trusted person. ? Spending time with friends and family. Safety  Always wear your seat belt while driving or riding in a vehicle.  Do not drive: ? If you have been drinking alcohol. Do not ride with someone who has been drinking. ? When you are tired or distracted. ? While texting.  Wear a helmet and other protective equipment during sports activities.  If you have firearms in your house, make sure you follow all gun safety procedures. What's next?  Visit your health care provider once a year for an annual wellness visit.  Ask your health care provider how often you should have your eyes and teeth checked.  Stay up to date on all vaccines. This information is not intended to replace advice given to you by your health care provider. Make sure you discuss any questions you have with your health care provider. Document Revised: 07/05/2020 Document Reviewed: 07/09/2018 Elsevier Patient Education  2021 Elsevier Inc.  

## 2020-10-31 ENCOUNTER — Other Ambulatory Visit: Payer: Self-pay | Admitting: Physician Assistant

## 2020-10-31 DIAGNOSIS — E785 Hyperlipidemia, unspecified: Secondary | ICD-10-CM

## 2020-11-15 ENCOUNTER — Ambulatory Visit: Payer: Medicare Other | Admitting: Physician Assistant

## 2020-11-28 ENCOUNTER — Other Ambulatory Visit: Payer: Self-pay | Admitting: Physician Assistant

## 2020-11-28 DIAGNOSIS — F39 Unspecified mood [affective] disorder: Secondary | ICD-10-CM

## 2020-11-30 ENCOUNTER — Other Ambulatory Visit: Payer: Self-pay | Admitting: Physician Assistant

## 2020-11-30 DIAGNOSIS — F39 Unspecified mood [affective] disorder: Secondary | ICD-10-CM

## 2020-12-27 ENCOUNTER — Encounter: Payer: Self-pay | Admitting: Physician Assistant

## 2020-12-27 ENCOUNTER — Other Ambulatory Visit: Payer: Self-pay

## 2020-12-27 ENCOUNTER — Ambulatory Visit (INDEPENDENT_AMBULATORY_CARE_PROVIDER_SITE_OTHER): Payer: Medicare Other | Admitting: Physician Assistant

## 2020-12-27 VITALS — BP 108/63 | HR 88 | Temp 99.1°F | Ht 64.0 in | Wt 115.2 lb

## 2020-12-27 DIAGNOSIS — F39 Unspecified mood [affective] disorder: Secondary | ICD-10-CM

## 2020-12-27 DIAGNOSIS — I10 Essential (primary) hypertension: Secondary | ICD-10-CM | POA: Diagnosis not present

## 2020-12-27 MED ORDER — LOSARTAN POTASSIUM-HCTZ 100-25 MG PO TABS: 0.5000 | ORAL_TABLET | Freq: Every day | ORAL | 0 refills | Status: DC

## 2020-12-27 MED ORDER — PAROXETINE HCL 10 MG PO TABS: 5.0000 mg | ORAL_TABLET | Freq: Every day | ORAL | 1 refills | Status: DC

## 2020-12-27 NOTE — Assessment & Plan Note (Addendum)
-  Soft blood pressure today and patient symptomatic so recommend decreasing Hyzaar 100-25 mg to 0.5 tablet and start monitoring BP/pulse at home for the next two weeks and forward BP readings. Patient verbalized understanding and agreeable. -BP goal is <140/90 and advised to monitor for hypotensive episodes <100/60. Advised if BP consistently above goal then recommend resuming full tablet dose.  -Continue good hydration. -Will continue to monitor.

## 2020-12-27 NOTE — Progress Notes (Signed)
Established Patient Office Visit  Subjective:  Patient ID: Michele Tyler, female    DOB: [birth year]  Age: 75 y.o. MRN: [id number]  CC:  Chief Complaint  Patient presents with  . Follow-up    HPI Michele Tyler presents for follow up on mood management. Patient was started on Viibryd and reports self-discontinued medication. States did not tolerate med well, felt it was too strong. Noticed more irritability and felt like her heart was racing. Has been off medication for about 2-3 weeks and wishes to restart paxil. Denies SI/HI.  HTN: Pt denies chest pain, palpitations, dizziness or lower extremity swelling. Taking medication as directed without side effects. Does not check BP at home. Reports has noticed feeling tired. Reports good hydration.    Past Medical History:  Diagnosis Date  . Anxiety   . Depression   . Hypertension     Past Surgical History:  Procedure Laterality Date  . DILATION AND CURETTAGE OF UTERUS  2002  . FACELIFT  2005    Family History  Problem Relation Age of Onset  . Hypertension Mother   . Depression Father   . Hypertension Father     Social History   Socioeconomic History  . Marital status: Divorced    Spouse name: Not on file  . Number of children: Not on file  . Years of education: Not on file  . Highest education level: Not on file  Occupational History  . Not on file  Tobacco Use  . Smoking status: Former Smoker    Packs/day: 1.00    Types: Cigarettes    Quit date: 1988    Years since quitting: 34.4  . Smokeless tobacco: Never Used  Vaping Use  . Vaping Use: Never used  Substance and Sexual Activity  . Alcohol use: Yes    Alcohol/week: 1.0 - 2.0 standard drink    Types: 1 - 2 Standard drinks or equivalent per week    Comment: social  . Drug use: No  . Sexual activity: Not Currently  Other Topics Concern  . Not on file  Social History Narrative  . Not on file   Social Determinants of Health   Financial Resource  Strain: Not on file  Food Insecurity: Not on file  Transportation Needs: Not on file  Physical Activity: Not on file  Stress: Not on file  Social Connections: Not on file  Intimate Partner Violence: Not on file    Outpatient Medications Prior to Visit  Medication Sig Dispense Refill  . Apple Cider Vinegar 600 MG CAPS Take 1 capsule by mouth daily.    . Ascorbic Acid (VITAMIN C CR) 1500 MG TBCR Take 1 tablet by mouth daily.    Marland Kitchen atorvastatin (LIPITOR) 10 MG tablet TAKE 1 TABLET BY MOUTH AT  BEDTIME 90 tablet 0  . B Complex-Folic Acid (BALANCED R-678 PO) Take by mouth.    . busPIRone (BUSPAR) 7.5 MG tablet TAKE 1 TABLET BY MOUTH 3  TIMES DAILY 270 tablet 0  . CALCIUM-MAGNESUIUM-ZINC 333-133-8.3 MG TABS Take 4 tablets by mouth daily. 1200 mg daily    . Cholecalciferol (VITAMIN D3) 5000 units CAPS Take 1 capsule by mouth daily.    . Cyanocobalamin (B-12) 1000 MCG CAPS Take 1 capsule by mouth.    . Ferrous Sulfate (IRON) 325 (65 Fe) MG TABS Take 1 tablet by mouth daily.    . folic acid (FOLVITE) 938 MCG tablet Take 800 mcg by mouth daily. [place] Omega-3 Fatty  Acids (OMEGA-3 FISH OIL PO) Take 4,000 mg by mouth daily. Contains omega 3 450 mg and fish oil 1500 mg    . Soy Isoflavone 750 MG CAPS Take 1 capsule by mouth daily.    . TURMERIC PO Take 800 mg by mouth.    . vitamin A 8000 UNIT capsule Take 8,000 Units by mouth daily.    Marland Kitchen VITAMIN E-1000 PO Take 1 capsule by mouth daily.    . Vitamins-Lipotropics (BALANCED B-150 COMPLEX TR PO) Take 1 tablet by mouth daily. Contains b1 150mg , b2 150mg , b6 150mg , b12 150mg , niacin 150 mg    . losartan-hydrochlorothiazide (HYZAAR) 100-25 MG tablet Take 1 tablet by mouth daily. 15 tablet 0  . Vilazodone HCl (VIIBRYD) 10 MG TABS Take 1 tablet (10 mg total) by mouth daily. 30 tablet 0   No facility-administered medications prior to visit.    Allergies  Allergen Reactions  . Codeine Nausea Only    ROS Review of Systems Review of Systems:  A  fourteen system review of systems was performed and found to be positive as per HPI.   Objective:    Physical Exam General:  Well Developed, well nourished, in no acute distress  Neuro:  Alert and oriented,  extra-ocular muscles intact  HEENT:  Normocephalic, atraumatic, neck supple Skin:  no gross rash, warm, pink. Cardiac:  RRR, S1 S2, no murmur  Respiratory:  ECTA B/L, Not using accessory muscles, speaking in full sentences- unlabored. Vascular:  Ext warm, no cyanosis apprec.; cap RF less 2 sec. No edema  Psych:  No HI/SI, judgement and insight good, Euthymic mood. Full Affect.   BP 108/63   Pulse 88   Temp 99.1 F (37.3 C)   Ht 5\' 4"  (1.626 m)   Wt 115 lb 4 oz (52.3 kg)   SpO2 97%   BMI 19.78 kg/m  Wt Readings from Last 3 Encounters:  12/27/20 115 lb 4 oz (52.3 kg)  10/11/20 119 lb 11.2 oz (54.3 kg)  06/07/20 113 lb (51.3 kg)     Health Maintenance Due  Topic Date Due  . Zoster Vaccines- Shingrix (1 of 2) Never done  . PNA vac Low Risk Adult (2 of 2 - PCV13) 09/07/2014  . MAMMOGRAM  04/24/2019    There are no preventive care reminders to display for this patient.  Lab Results  Component Value Date   TSH 0.501 07/05/2020   Lab Results  Component Value Date   WBC 4.0 07/05/2020   HGB 13.3 07/05/2020   HCT 39.0 07/05/2020   MCV 89 07/05/2020   PLT 408 07/05/2020   Lab Results  Component Value Date   NA 132 (L) 07/05/2020   K 3.9 07/05/2020   CO2 25 07/05/2020   GLUCOSE 90 07/05/2020   BUN 10 07/05/2020   CREATININE 0.58 07/05/2020   BILITOT 0.6 07/05/2020   ALKPHOS 88 07/05/2020   AST 23 07/05/2020   ALT 21 07/05/2020   PROT 6.7 07/05/2020   ALBUMIN 4.8 (H) 07/05/2020   CALCIUM 9.5 07/05/2020   Lab Results  Component Value Date   CHOL 188 07/05/2020   Lab Results  Component Value Date   HDL 99 07/05/2020   Lab Results  Component Value Date   LDLCALC 80 07/05/2020   Lab Results  Component Value Date   TRIG 43 07/05/2020   Lab Results   Component Value Date   CHOLHDL 1.9 07/05/2020   Lab Results  Component Value Date   HGBA1C 5.4 07/05/2020  Assessment & Plan:   Problem List Items Addressed This Visit      Cardiovascular and Mediastinum   Hypertension, goal below 150/90    -Soft blood pressure today and patient symptomatic so recommend decreasing Hyzaar 100-25 mg to 0.5 tablet and start monitoring BP/pulse at home for the next two weeks and forward BP readings. Patient verbalized understanding and agreeable. -BP goal is <140/90 and advised to monitor for hypotensive episodes <100/60. Advised if BP consistently above goal then recommend resuming full tablet dose.  -Continue good hydration. -Will continue to monitor.      Relevant Medications   losartan-hydrochlorothiazide (HYZAAR) 100-25 MG tablet     Other   Mood disorder (HCC) - Primary    -PHQ-9 score of 11. Denies SI/HI. -Will discontinue Viibryd and resume paroxetine. Patient reports has some supply at home and will restart. Patient was taking 0.5 tablet of 10 mg. Advised to let me know if tolerating medication without issues and will send refill. -Continue Buspar. -Will continue to monitor.         Meds ordered this encounter  Medications  . PARoxetine (PAXIL) 10 MG tablet    Sig: Take 0.5 tablets (5 mg total) by mouth daily.    Dispense:  45 tablet    Refill:  1    Order Specific Question:   Supervising Provider    Answer:   Beatrice Lecher D [[id number]]  . losartan-hydrochlorothiazide (HYZAAR) 100-25 MG tablet    Sig: Take 0.5 tablets by mouth daily.    Dispense:  15 tablet    Refill:  0    Requesting 1 year supply    Order Specific Question:   Supervising Provider    Answer:   Beatrice Lecher D [[id number]]    Follow-up: Return in about 4 months (around 04/28/2021) for HTN, mood.    Lorrene Reid, PA-C

## 2020-12-27 NOTE — Assessment & Plan Note (Addendum)
-  PHQ-9 score of 11. Denies SI/HI. -Will discontinue Viibryd and resume paroxetine. Patient reports has some supply at home and will restart. Patient was taking 0.5 tablet of 10 mg. Advised to let me know if tolerating medication without issues and will send refill. -Continue Buspar. -Will continue to monitor.

## 2021-01-02 ENCOUNTER — Encounter: Payer: Self-pay | Admitting: Physician Assistant

## 2021-01-24 ENCOUNTER — Other Ambulatory Visit: Payer: Self-pay | Admitting: Physician Assistant

## 2021-01-24 DIAGNOSIS — E785 Hyperlipidemia, unspecified: Secondary | ICD-10-CM

## 2021-02-22 ENCOUNTER — Other Ambulatory Visit: Payer: Self-pay | Admitting: Physician Assistant

## 2021-02-22 DIAGNOSIS — I10 Essential (primary) hypertension: Secondary | ICD-10-CM

## 2021-04-17 ENCOUNTER — Other Ambulatory Visit: Payer: Self-pay | Admitting: Physician Assistant

## 2021-04-17 DIAGNOSIS — E785 Hyperlipidemia, unspecified: Secondary | ICD-10-CM

## 2021-04-23 LAB — COLOGUARD
COLOGUARD: NEGATIVE
Cologuard: NEGATIVE

## 2021-05-02 ENCOUNTER — Encounter: Payer: Self-pay | Admitting: Physician Assistant

## 2021-05-02 ENCOUNTER — Ambulatory Visit (INDEPENDENT_AMBULATORY_CARE_PROVIDER_SITE_OTHER): Payer: Medicare Other | Admitting: Physician Assistant

## 2021-05-02 ENCOUNTER — Other Ambulatory Visit: Payer: Self-pay

## 2021-05-02 VITALS — BP 126/69 | HR 80 | Temp 98.0°F | Ht 64.0 in | Wt 113.0 lb

## 2021-05-02 DIAGNOSIS — E785 Hyperlipidemia, unspecified: Secondary | ICD-10-CM

## 2021-05-02 DIAGNOSIS — I10 Essential (primary) hypertension: Secondary | ICD-10-CM

## 2021-05-02 DIAGNOSIS — F39 Unspecified mood [affective] disorder: Secondary | ICD-10-CM | POA: Diagnosis not present

## 2021-05-02 MED ORDER — LOSARTAN POTASSIUM-HCTZ 100-25 MG PO TABS: 0.5000 | ORAL_TABLET | Freq: Every day | ORAL | 0 refills | Status: DC

## 2021-05-02 MED ORDER — BUSPIRONE HCL 7.5 MG PO TABS
ORAL_TABLET | ORAL | 1 refills | Status: DC
Start: 2021-05-02 — End: 2021-10-08

## 2021-05-02 NOTE — Progress Notes (Signed)
Established Patient Office Visit  Subjective:  Patient ID: Michele Tyler, female    DOB: 29-Oct-1945  Age: 75 y.o. MRN: [id number]  CC:  Chief Complaint  Patient presents with   Hypertension    HPI [name] presents for follow up on mood management and hypertension. Reports recently had her flu shot and Covid booster at [employer]. Discussed negative Cologuard results.  Mood: Reports no longer taking paroxetine. When she re-started medication felt more irritable. Continues with Buspar. Continues to cope with daily stressors. Denies SI/HI.  HTN: Pt denies chest pain, palpitations, dizziness or lower extremity swelling. At last visit Hyzaar was adjusted to 0.5 tablet of 100-25 mg due to soft blood pressure and states feels better with lower dose. Taking medication as directed without side effects. Has not been checking BP at home.   Past Medical History:  Diagnosis Date   Anxiety    Depression    Hypertension     Past Surgical History:  Procedure Laterality Date   DILATION AND CURETTAGE OF UTERUS  2002   FACELIFT  2005    Family History  Problem Relation Age of Onset   Hypertension Mother    Depression Father    Hypertension Father     Social History   Socioeconomic History   Marital status: Divorced    Spouse name: Not on file   Number of children: Not on file   Years of education: Not on file   Highest education level: Not on file  Occupational History   Not on file  Tobacco Use   Smoking status: Former    Packs/day: 1.00    Types: Cigarettes    Quit date: 41    Years since quitting: 34.7   Smokeless tobacco: Never  Vaping Use   Vaping Use: Never used  Substance and Sexual Activity   Alcohol use: Yes    Alcohol/week: 1.0 - 2.0 standard drink    Types: 1 - 2 Standard drinks or equivalent per week    Comment: social   Drug use: No   Sexual activity: Not Currently  Other Topics Concern   Not on file  Social History Narrative   Not on  file   Social Determinants of Health   Financial Resource Strain: Not on file  Food Insecurity: Not on file  Transportation Needs: Not on file  Physical Activity: Not on file  Stress: Not on file  Social Connections: Not on file  Intimate Partner Violence: Not on file    Outpatient Medications Prior to Visit  Medication Sig Dispense Refill   Apple Cider Vinegar 600 MG CAPS Take 1 capsule by mouth daily.     Ascorbic Acid (VITAMIN C CR) 1500 MG TBCR Take 1 tablet by mouth daily.     atorvastatin (LIPITOR) 10 MG tablet TAKE 1 TABLET BY MOUTH AT  BEDTIME 90 tablet 0   B Complex-Folic Acid (BALANCED I-502 PO) Take by mouth.     CALCIUM-MAGNESUIUM-ZINC 333-133-8.3 MG TABS Take 4 tablets by mouth daily. 1200 mg daily     Cholecalciferol (VITAMIN D3) 5000 units CAPS Take 1 capsule by mouth daily.     Cyanocobalamin (B-12) 1000 MCG CAPS Take 1 capsule by mouth.     Ferrous Sulfate (IRON) 325 (65 Fe) MG TABS Take 1 tablet by mouth daily.     folic acid (FOLVITE) 774 MCG tablet Take 800 mcg by mouth daily. 1333 MCG     losartan-hydrochlorothiazide (HYZAAR) 100-25 MG tablet TAKE 1 TABLET BY  MOUTH  DAILY 90 tablet 0   Omega-3 Fatty Acids (OMEGA-3 FISH OIL PO) Take 4,000 mg by mouth daily. Contains omega 3 450 mg and fish oil 1500 mg     Soy Isoflavone 750 MG CAPS Take 1 capsule by mouth daily.     TURMERIC PO Take 800 mg by mouth.     vitamin A 8000 UNIT capsule Take 8,000 Units by mouth daily.     VITAMIN E-1000 PO Take 1 capsule by mouth daily.     Vitamins-Lipotropics (BALANCED B-150 COMPLEX TR PO) Take 1 tablet by mouth daily. Contains b1 150mg , b2 150mg , b6 150mg , b12 150mg , niacin 150 mg     busPIRone (BUSPAR) 7.5 MG tablet TAKE 1 TABLET BY MOUTH 3  TIMES DAILY 270 tablet 0   PARoxetine (PAXIL) 10 MG tablet Take 0.5 tablets (5 mg total) by mouth daily. 45 tablet 1   No facility-administered medications prior to visit.    Allergies  Allergen Reactions   Codeine Nausea Only     ROS Review of Systems Review of Systems:  A fourteen system review of systems was performed and found to be positive as per HPI.   Objective:    Physical Exam General:  Pleasant and cooperative, in no acute distress  Neuro:  Alert and oriented,  extra-ocular muscles intact  HEENT:  Normocephalic, atraumatic, neck supple Skin:  no gross rash, warm, pink. Cardiac:  RRR, S1 S2 Respiratory:  CTA B/L , Not using accessory muscles, speaking in full sentences- unlabored. Vascular:  Ext warm, no cyanosis apprec.; cap RF less 2 sec. No edema  Psych:  No HI/SI, judgement and insight good, Euthymic mood. Full Affect.  BP 126/69   Pulse 80   Temp 98 F (36.7 C)   Ht 5\' 4"  (1.626 m)   Wt 113 lb (51.3 kg)   SpO2 99%   BMI 19.40 kg/m  Wt Readings from Last 3 Encounters:  05/02/21 113 lb (51.3 kg)  12/27/20 115 lb 4 oz (52.3 kg)  10/11/20 119 lb 11.2 oz (54.3 kg)     Health Maintenance Due  Topic Date Due   Zoster Vaccines- Shingrix (1 of 2) Never done   COVID-19 Vaccine (4 - Booster for Pfizer series) 09/03/2020   INFLUENZA VACCINE  02/26/2021    There are no preventive care reminders to display for this patient.  Lab Results  Component Value Date   TSH 0.501 07/05/2020   Lab Results  Component Value Date   WBC 4.0 07/05/2020   HGB 13.3 07/05/2020   HCT 39.0 07/05/2020   MCV 89 07/05/2020   PLT 408 07/05/2020   Lab Results  Component Value Date   NA 132 (L) 07/05/2020   K 3.9 07/05/2020   CO2 25 07/05/2020   GLUCOSE 90 07/05/2020   BUN 10 07/05/2020   CREATININE 0.58 07/05/2020   BILITOT 0.6 07/05/2020   ALKPHOS 88 07/05/2020   AST 23 07/05/2020   ALT 21 07/05/2020   PROT 6.7 07/05/2020   ALBUMIN 4.8 (H) 07/05/2020   CALCIUM 9.5 07/05/2020   Lab Results  Component Value Date   CHOL 188 07/05/2020   Lab Results  Component Value Date   HDL 99 07/05/2020   Lab Results  Component Value Date   LDLCALC 80 07/05/2020   Lab Results  Component Value  Date   TRIG 43 07/05/2020   Lab Results  Component Value Date   CHOLHDL 1.9 07/05/2020   Lab Results  Component Value Date  HGBA1C 5.4 07/05/2020   Depression screen [facility] 2/9 05/02/2021 12/27/2020 10/11/2020 06/07/2020 02/02/2020  Decreased Interest 2 2 3 2 1   Down, Depressed, Hopeless 2 2 3 2 1   PHQ - 2 Score 4 4 6 4 2   Altered sleeping 1 1 2 2 1   Tired, decreased energy 0 1 0 0 0  Change in appetite 0 0 0 0 0  Feeling bad or failure about yourself  2 2 3 2 1   Trouble concentrating 1 0 0 2 0  Moving slowly or fidgety/restless 0 0 0 0 0  Suicidal thoughts 0 0 0 0 0  PHQ-9 Score 8 8 11 10 4   Difficult doing work/chores - - - Somewhat difficult Somewhat difficult  Some recent data might be hidden   GAD 7 : Generalized Anxiety Score 05/02/2021 12/27/2020 06/07/2020 11/03/2019  Nervous, Anxious, on Edge 2 3 2 3   Control/stop worrying 2 3 2 3   Worry too much - different things 2 3 2 3   Trouble relaxing 2 3 2 3   Restless 1 1 1  0  Easily annoyed or irritable 2 2 1 3   Afraid - awful might happen 2 3 3 3   Total GAD 7 Score 13 18 13 18   Anxiety Difficulty - - Somewhat difficult Somewhat difficult       Assessment & Plan:   Problem List Items Addressed This Visit       Cardiovascular and Mediastinum   Hypertension, goal below 150/90 - Primary    -Controlled. -Continue current medication regimen. -Will continue to monitor.        Other   Mood disorder ([place])    -Stable. Patient has failed several antidepressants including Trintellix and Viibryd. Reports has not tried Pristiq (desvenlafaxine), but does not want to start a new medication at this time. Will continue Buspar. -Will continue to monitor.      Relevant Medications   busPIRone (BUSPAR) 7.5 MG tablet   Hyperlipidemia    -Last lipid panel wnl's. -Continue current medication regimen. -Will repeat fasting labs in 2 months.          Meds ordered this encounter  Medications   busPIRone (BUSPAR) 7.5 MG tablet     Sig: TAKE 1 TABLET BY MOUTH 3  TIMES DAILY    Dispense:  270 tablet    Refill:  1    Order Specific Question:   Supervising Provider    Answer:   Beatrice Lecher D [[id number]    Follow-up: Return for FBW include B12/folate, Vit D and iron/TIBC/ferritin in December; [place] in March 2023 .    Lorrene Reid, PA-C

## 2021-05-02 NOTE — Assessment & Plan Note (Signed)
-  Last lipid panel wnl's. -Continue current medication regimen. -Will repeat fasting labs in 2 months.

## 2021-05-02 NOTE — Assessment & Plan Note (Signed)
-  Stable. Patient has failed several antidepressants including Trintellix and Viibryd. Reports has not tried Pristiq (desvenlafaxine), but does not want to start a new medication at this time. Will continue Buspar. -Will continue to monitor.

## 2021-05-02 NOTE — Assessment & Plan Note (Signed)
-  Controlled. Continue current medication regimen. Will continue to monitor. 

## 2021-05-10 ENCOUNTER — Encounter: Payer: Self-pay | Admitting: Physician Assistant

## 2021-05-10 LAB — COLOGUARD: Cologuard: NEGATIVE

## 2021-05-17 ENCOUNTER — Other Ambulatory Visit: Payer: Self-pay | Admitting: Physician Assistant

## 2021-05-17 DIAGNOSIS — I10 Essential (primary) hypertension: Secondary | ICD-10-CM

## 2021-05-23 LAB — HM MAMMOGRAPHY

## 2021-06-01 ENCOUNTER — Encounter: Payer: Self-pay | Admitting: Physician Assistant

## 2021-07-04 ENCOUNTER — Other Ambulatory Visit: Payer: Self-pay

## 2021-07-04 ENCOUNTER — Other Ambulatory Visit: Payer: Medicare Other

## 2021-07-04 DIAGNOSIS — E538 Deficiency of other specified B group vitamins: Secondary | ICD-10-CM

## 2021-07-04 DIAGNOSIS — I10 Essential (primary) hypertension: Secondary | ICD-10-CM

## 2021-07-04 DIAGNOSIS — E785 Hyperlipidemia, unspecified: Secondary | ICD-10-CM

## 2021-07-04 DIAGNOSIS — E7800 Pure hypercholesterolemia, unspecified: Secondary | ICD-10-CM

## 2021-07-04 DIAGNOSIS — Z0000 Encounter for general adult medical examination without abnormal findings: Secondary | ICD-10-CM

## 2021-07-04 DIAGNOSIS — E611 Iron deficiency: Secondary | ICD-10-CM

## 2021-07-05 LAB — B12 AND FOLATE PANEL
Folate: >20 ng/mL (ref >3.0)
Vitamin B-12: 980 pg/mL (ref 232–1245)

## 2021-07-05 LAB — VITAMIN D 25 HYDROXY (VIT D DEFICIENCY, FRACTURES): Vit D, 25-Hydroxy: 40.6 ng/mL (ref 30.0–100.0)

## 2021-07-05 LAB — IRON,TIBC AND FERRITIN PANEL
Ferritin: 391 ng/mL — ABNORMAL HIGH (ref 15–150)
Iron Saturation: 36 % (ref 15–55)
Iron: 102 ug/dL (ref 27–139)
Total Iron Binding Capacity: 286 ug/dL (ref 250–450)
UIBC: 184 ug/dL (ref 118–369)

## 2021-07-11 ENCOUNTER — Other Ambulatory Visit: Payer: Self-pay | Admitting: Physician Assistant

## 2021-07-11 DIAGNOSIS — E785 Hyperlipidemia, unspecified: Secondary | ICD-10-CM

## 2021-09-04 ENCOUNTER — Encounter: Payer: Self-pay | Admitting: Nurse Practitioner

## 2021-09-04 ENCOUNTER — Other Ambulatory Visit: Payer: Self-pay

## 2021-09-04 ENCOUNTER — Ambulatory Visit (INDEPENDENT_AMBULATORY_CARE_PROVIDER_SITE_OTHER): Payer: Medicare Other | Admitting: Nurse Practitioner

## 2021-09-04 VITALS — BP 116/69 | HR 79 | Temp 97.9°F | Ht 64.0 in | Wt 117.6 lb

## 2021-09-04 DIAGNOSIS — T23222A Burn of second degree of single left finger (nail) except thumb, initial encounter: Secondary | ICD-10-CM | POA: Insufficient documentation

## 2021-09-04 MED ORDER — SILVER SULFADIAZINE 1 % EX CREA: 1.0000 "application " | TOPICAL_CREAM | Freq: Every day | CUTANEOUS | 0 refills | Status: DC

## 2021-09-04 NOTE — Progress Notes (Signed)
Established patient visit   Patient: Michele Tyler   DOB: [birth year]   76 y.o. Female  MRN: [id number] Visit Date: 09/04/2021  Chief Complaint  Patient presents with   Finger Injury   Subjective    The patient states that she burned herself while cooking about 10 days ago. Nurn is to the inner aspect of the left middle finger. Has become slightly red around the edges of the burn. She states that she is able to move her fingers without difficulty and has not inturrupted her normal activities.   Burn The incident occurred more than 1 week ago. The burns occurred at home. The burns occurred while cooking. The burns were a result of contact with a hot surface. The burns are located on the left fingers (left middle finger). The pain is at a severity of 1/10. She has tried salve for the symptoms. The treatment provided mild relief.     Medications: Outpatient Medications Prior to Visit  Medication Sig   Apple Cider Vinegar 600 MG CAPS Take 1 capsule by mouth daily.   Ascorbic Acid (VITAMIN C CR) 1500 MG TBCR Take 1 tablet by mouth daily.   atorvastatin (LIPITOR) 10 MG tablet TAKE 1 TABLET BY MOUTH AT  BEDTIME   B Complex-Folic Acid (BALANCED H-829 PO) Take by mouth.   busPIRone (BUSPAR) 7.5 MG tablet TAKE 1 TABLET BY MOUTH 3  TIMES DAILY   CALCIUM-MAGNESUIUM-ZINC 333-133-8.3 MG TABS Take 4 tablets by mouth daily. 1200 mg daily   Cholecalciferol (VITAMIN D3) 5000 units CAPS Take 1 capsule by mouth daily.   Cyanocobalamin (B-12) 1000 MCG CAPS Take 1 capsule by mouth.   Ferrous Sulfate (IRON) 325 (65 Fe) MG TABS Take 1 tablet by mouth daily.   folic acid (FOLVITE) 937 MCG tablet Take 800 mcg by mouth daily. 1333 MCG   losartan-hydrochlorothiazide (HYZAAR) 100-25 MG tablet Take 0.5 tablets by mouth daily.   Omega-3 Fatty Acids (OMEGA-3 FISH OIL PO) Take 4,000 mg by mouth daily. Contains omega 3 450 mg and fish oil 1500 mg   Soy Isoflavone 750 MG CAPS Take 1 capsule by mouth daily.   TURMERIC  PO Take 800 mg by mouth.   vitamin A 8000 UNIT capsule Take 8,000 Units by mouth daily.   VITAMIN E-1000 PO Take 1 capsule by mouth daily.   Vitamins-Lipotropics (BALANCED B-150 COMPLEX TR PO) Take 1 tablet by mouth daily. Contains b1 150mg , b2 150mg , b6 150mg , b12 150mg , niacin 150 mg   No facility-administered medications prior to visit.    Review of Systems  Constitutional:  Negative for activity change, appetite change, chills, fatigue and fever.  HENT:  Negative for congestion, postnasal drip, rhinorrhea, sinus pressure, sinus pain, sneezing and sore throat.   Eyes: Negative.   Respiratory:  Negative for cough, chest tightness, shortness of breath and wheezing.   Cardiovascular:  Negative for chest pain and palpitations.  Gastrointestinal:  Negative for abdominal pain, constipation, diarrhea, nausea and vomiting.  Endocrine: Negative for cold intolerance, heat intolerance, polydipsia and polyuria.  Genitourinary:  Negative for dyspareunia, dysuria, flank pain, frequency and urgency.  Musculoskeletal:  Negative for arthralgias, back pain and myalgias.  Skin:  Negative for rash.       Burn to middle aspect of the left middle finger. She has noted some redness around the outer edges of the burn. States that she is able to move her fingers without difficulty. Has little pain.   Allergic/Immunologic: Negative for environmental allergies.  Neurological:  Negative  for dizziness, weakness and headaches.  Hematological:  Negative for adenopathy.  Psychiatric/Behavioral:  The patient is not nervous/anxious.     Objective     Today's Vitals   09/04/21 0813  BP: 116/69  Pulse: 79  Temp: 97.9 F (36.6 C)  SpO2: 100%  Weight: 117 lb 9.6 oz (53.3 kg)  Height: 5\' 4"  (1.626 m)   Body mass index is 20.19 kg/m.   Physical Exam Vitals and nursing note reviewed.  Constitutional:      Appearance: Normal appearance. She is well-developed.  HENT:     Head: Normocephalic.  Eyes:      Pupils: Pupils are equal, round, and reactive to light.  Cardiovascular:     Rate and Rhythm: Normal rate and regular rhythm.     Pulses: Normal pulses.     Heart sounds: Normal heart sounds.  Pulmonary:     Effort: Pulmonary effort is normal.     Breath sounds: Normal breath sounds.  Abdominal:     Palpations: Abdomen is soft.  Musculoskeletal:        General: Normal range of motion.     Cervical back: Normal range of motion and neck supple.  Lymphadenopathy:     Cervical: No cervical adenopathy.  Skin:    General: Skin is warm and dry.     Capillary Refill: Capillary refill takes less than 2 seconds.     Comments: There is second degree burn to the medial aspect of the medial phalanx of the left middle finger. This measures about 1.5 inches in length and 0.25inches in width. There is evidence of healing present and no drainage noted. Sin surrounding the burn is slightly red. There is no warmth. No evidence of infection present. Patient has good ROM of the fingers of the left hand.   Neurological:     General: No focal deficit present.     Mental Status: She is alert and oriented to person, place, and time.  Psychiatric:        Mood and Affect: Mood normal.        Behavior: Behavior normal.        Thought Content: Thought content normal.        Judgment: Judgment normal.     Assessment & Plan     1. Partial thickness burn of finger of left hand, initial encounter No evidence of infection at this time. Add silvadene cream to effected area at night. Keep wound clean and dry. Continue gentle ROM. Advised the patient to notify the office if area of redness expands or if drainage is noted from the area of burn. Patient is in agreement with this plan         - silver sulfADIAZINE (SILVADENE) 1 % cream; Apply 1 application topically daily.  Dispense: 25 g; Refill: 0   Return for prn worsening or persistent symptoms.        Ronnell Freshwater, NP  [facility] at [facility] [phone] (phone) [phone] (fax)  [place]

## 2021-10-07 ENCOUNTER — Other Ambulatory Visit: Payer: Self-pay | Admitting: Physician Assistant

## 2021-10-07 DIAGNOSIS — I10 Essential (primary) hypertension: Secondary | ICD-10-CM

## 2021-10-07 DIAGNOSIS — F39 Unspecified mood [affective] disorder: Secondary | ICD-10-CM

## 2021-10-13 IMAGING — DX DG FOOT COMPLETE 3+V*L*
3 series · 3 of 3 positions shown · non-contrast
Comparison: None.

CLINICAL DATA: Left foot pain x1 week.

EXAM:
LEFT FOOT - COMPLETE 3+ VIEW

[foot supine dp]
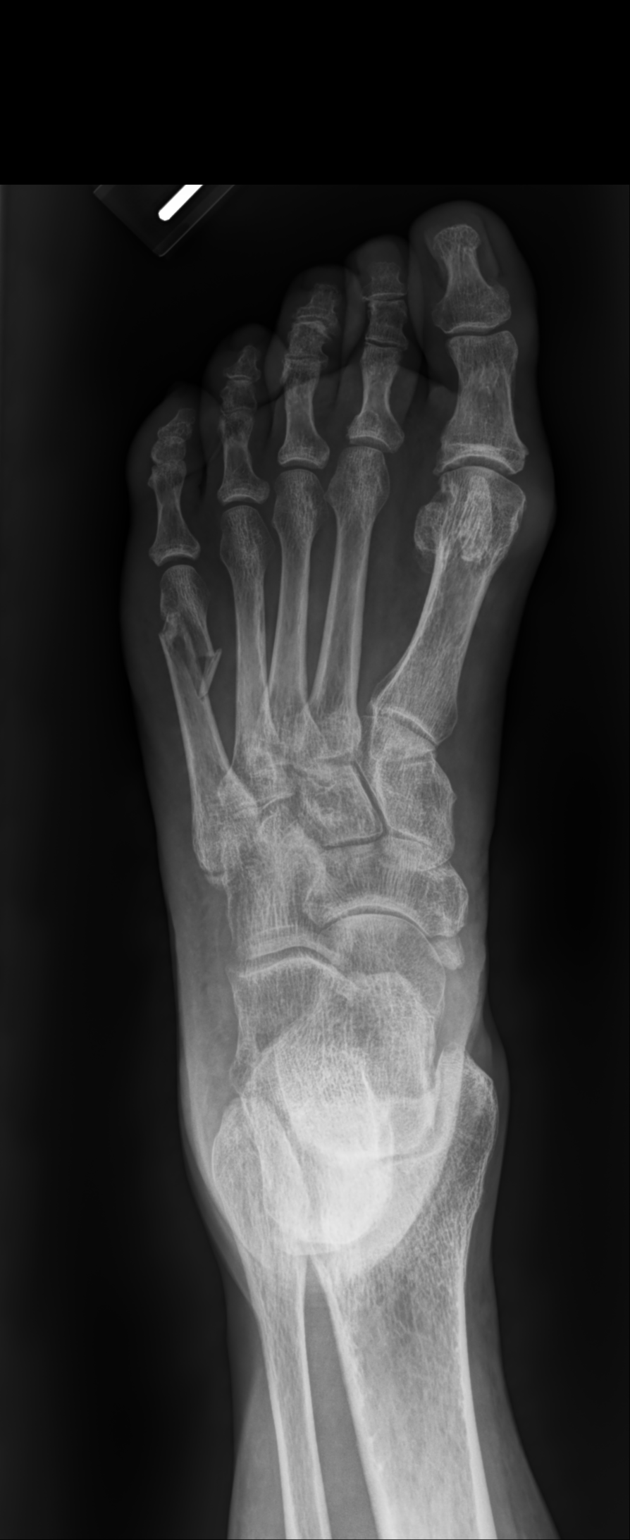

[foot medial oblique]
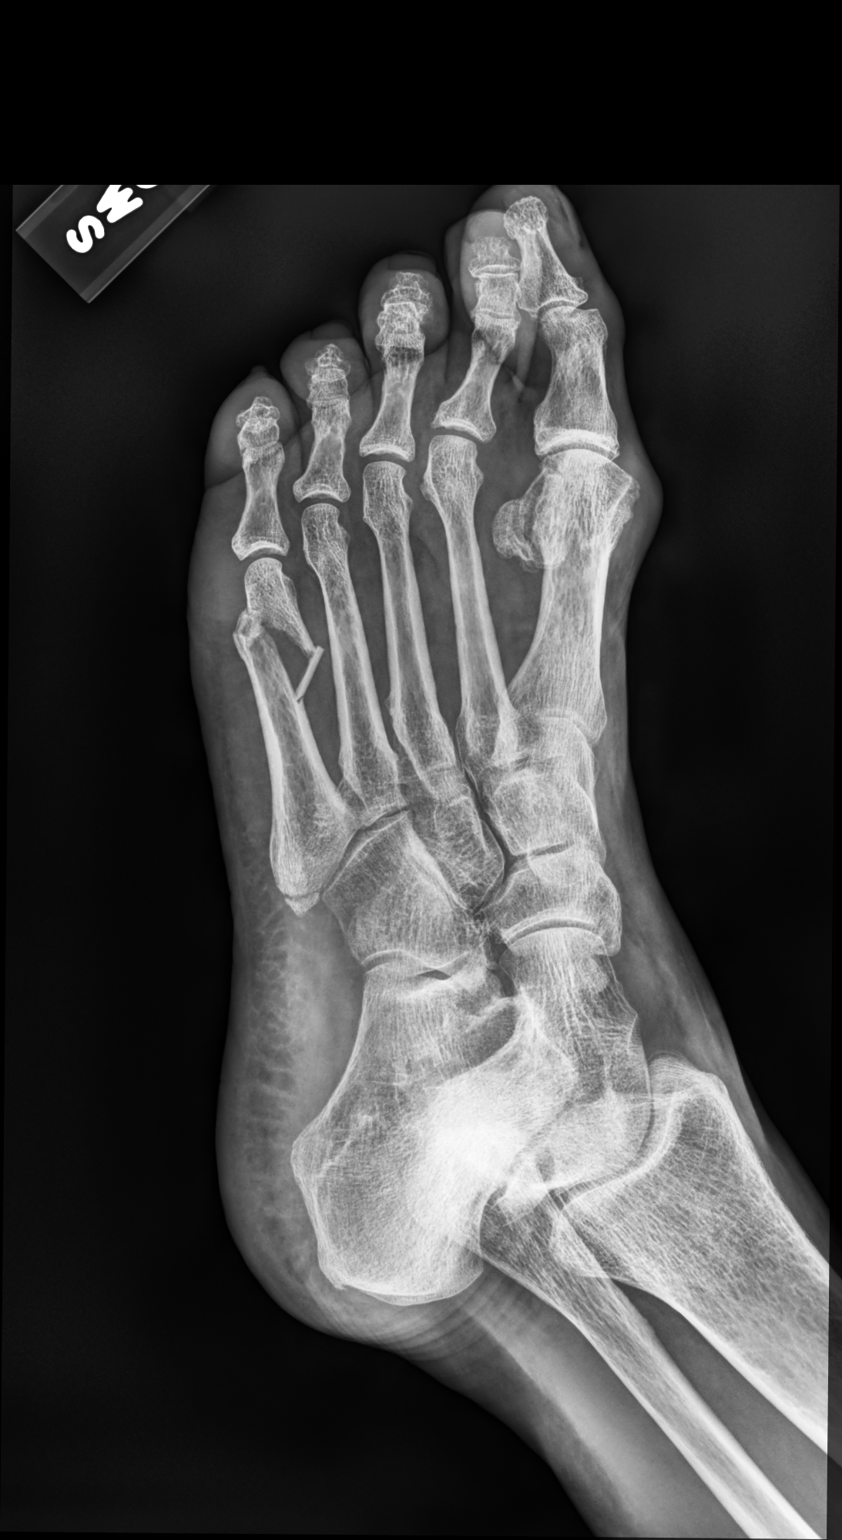

[foot supine lat]
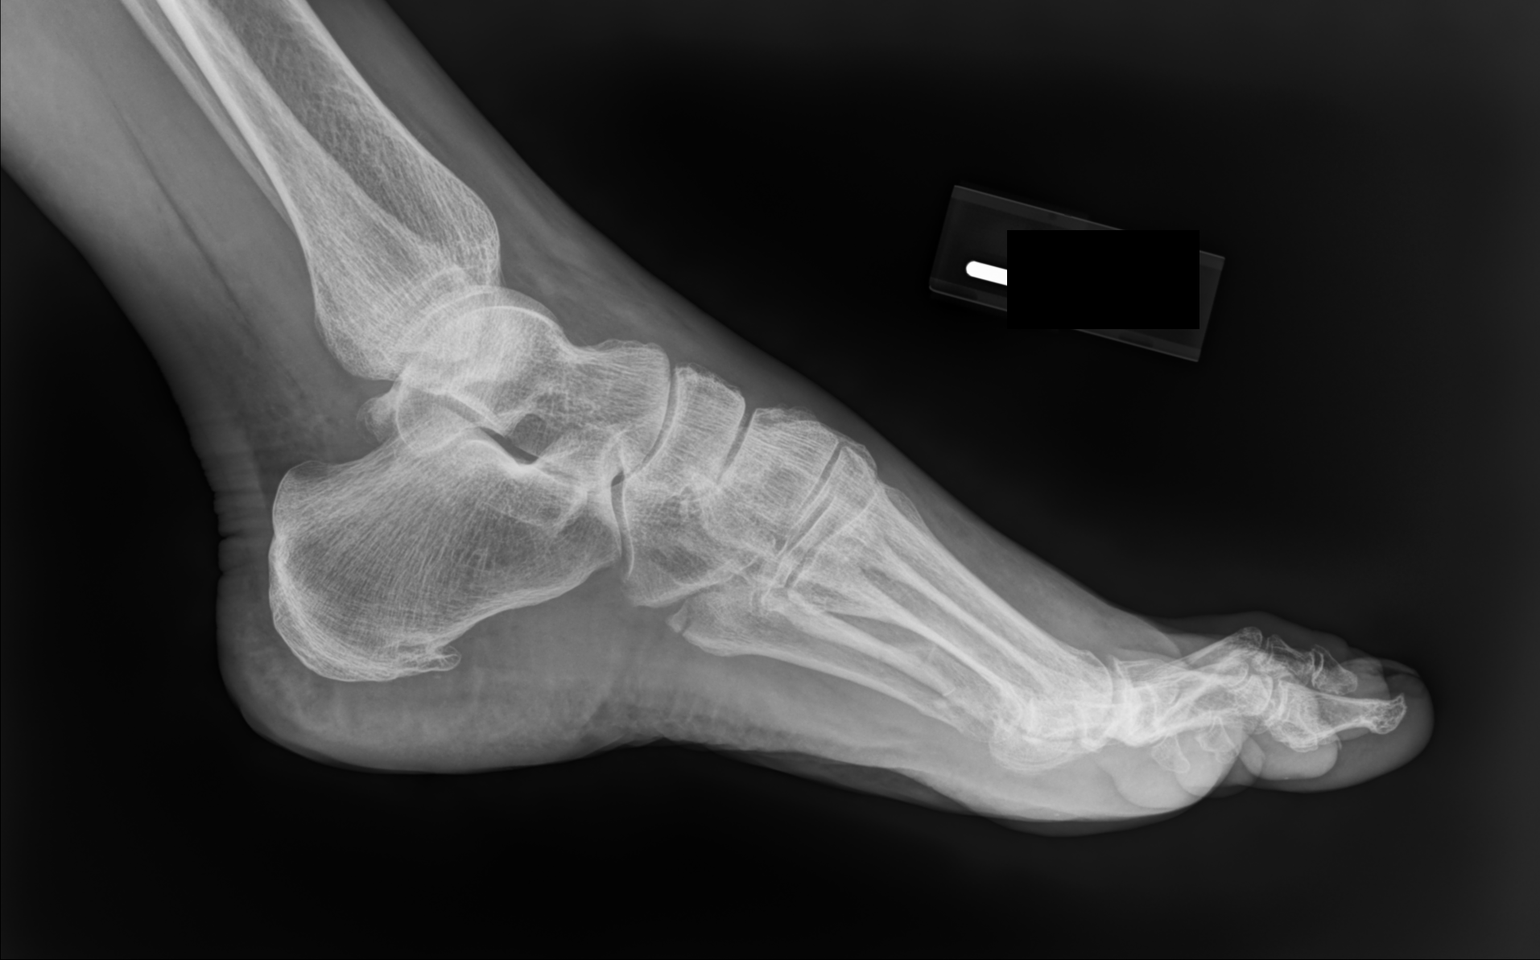

[3 of 3 positions shown; findings below may reference images not displayed]

FINDINGS: Acute fracture is seen involving the mid to distal portion of the
fifth left metatarsal. There is no evidence of dislocation. Mild
degenerative changes are seen involving the metatarsophalangeal
articulation of the left great toe. Mild soft tissue swelling is
seen adjacent to the previously noted fracture site.
IMPRESSION: Acute fracture of the fifth left metatarsal.

## 2021-10-28 ENCOUNTER — Other Ambulatory Visit: Payer: Self-pay | Admitting: Physician Assistant

## 2021-10-28 DIAGNOSIS — I10 Essential (primary) hypertension: Secondary | ICD-10-CM

## 2021-11-19 ENCOUNTER — Other Ambulatory Visit: Payer: Self-pay | Admitting: Physician Assistant

## 2021-11-19 DIAGNOSIS — I10 Essential (primary) hypertension: Secondary | ICD-10-CM

## 2021-12-24 ENCOUNTER — Other Ambulatory Visit: Payer: Self-pay | Admitting: Physician Assistant

## 2021-12-24 DIAGNOSIS — E785 Hyperlipidemia, unspecified: Secondary | ICD-10-CM

## 2022-01-02 ENCOUNTER — Ambulatory Visit: Payer: Medicare Other | Admitting: Physician Assistant

## 2022-01-09 ENCOUNTER — Encounter: Payer: Self-pay | Admitting: Physician Assistant

## 2022-01-09 ENCOUNTER — Ambulatory Visit (INDEPENDENT_AMBULATORY_CARE_PROVIDER_SITE_OTHER): Payer: Medicare Other | Admitting: Physician Assistant

## 2022-01-09 VITALS — BP 120/68 | HR 72 | Temp 97.7°F | Ht 64.0 in | Wt 115.0 lb

## 2022-01-09 DIAGNOSIS — E538 Deficiency of other specified B group vitamins: Secondary | ICD-10-CM

## 2022-01-09 DIAGNOSIS — Z0000 Encounter for general adult medical examination without abnormal findings: Secondary | ICD-10-CM | POA: Diagnosis not present

## 2022-01-09 DIAGNOSIS — I10 Essential (primary) hypertension: Secondary | ICD-10-CM

## 2022-01-09 DIAGNOSIS — E785 Hyperlipidemia, unspecified: Secondary | ICD-10-CM

## 2022-01-09 DIAGNOSIS — E611 Iron deficiency: Secondary | ICD-10-CM

## 2022-01-09 DIAGNOSIS — F39 Unspecified mood [affective] disorder: Secondary | ICD-10-CM

## 2022-01-09 MED ORDER — BUSPIRONE HCL 7.5 MG PO TABS: ORAL_TABLET | ORAL | 2 refills | Status: DC

## 2022-01-09 MED ORDER — ATORVASTATIN CALCIUM 10 MG PO TABS: 10.0000 mg | ORAL_TABLET | Freq: Every day | ORAL | 0 refills | Status: DC

## 2022-01-09 MED ORDER — LOSARTAN POTASSIUM-HCTZ 100-25 MG PO TABS: 1.0000 | ORAL_TABLET | Freq: Every day | ORAL | 0 refills | Status: DC

## 2022-01-09 NOTE — Patient Instructions (Signed)
Preventive Care 65 Years and Older, Female Preventive care refers to lifestyle choices and visits with your health care provider that can promote health and wellness. Preventive care visits are also called wellness exams. What can I expect for my preventive care visit? Counseling Your health care provider may ask you questions about your: Medical history, including: Past medical problems. Family medical history. Pregnancy and menstrual history. History of falls. Current health, including: Memory and ability to understand (cognition). Emotional well-being. Home life and relationship well-being. Sexual activity and sexual health. Lifestyle, including: Alcohol, nicotine or tobacco, and drug use. Access to firearms. Diet, exercise, and sleep habits. Work and work environment. Sunscreen use. Safety issues such as seatbelt and bike helmet use. Physical exam Your health care provider will check your: Height and weight. These may be used to calculate your BMI (body mass index). BMI is a measurement that tells if you are at a healthy weight. Waist circumference. This measures the distance around your waistline. This measurement also tells if you are at a healthy weight and may help predict your risk of certain diseases, such as type 2 diabetes and high blood pressure. Heart rate and blood pressure. Body temperature. Skin for abnormal spots. What immunizations do I need?  Vaccines are usually given at various ages, according to a schedule. Your health care provider will recommend vaccines for you based on your age, medical history, and lifestyle or other factors, such as travel or where you work. What tests do I need? Screening Your health care provider may recommend screening tests for certain conditions. This may include: Lipid and cholesterol levels. Hepatitis C test. Hepatitis B test. HIV (human immunodeficiency virus) test. STI (sexually transmitted infection) testing, if you are at  risk. Lung cancer screening. Colorectal cancer screening. Diabetes screening. This is done by checking your blood sugar (glucose) after you have not eaten for a while (fasting). Mammogram. Talk with your health care provider about how often you should have regular mammograms. BRCA-related cancer screening. This may be done if you have a family history of breast, ovarian, tubal, or peritoneal cancers. Bone density scan. This is done to screen for osteoporosis. Talk with your health care provider about your test results, treatment options, and if necessary, the need for more tests. Follow these instructions at home: Eating and drinking  Eat a diet that includes fresh fruits and vegetables, whole grains, lean protein, and low-fat dairy products. Limit your intake of foods with high amounts of sugar, saturated fats, and salt. Take vitamin and mineral supplements as recommended by your health care provider. Do not drink alcohol if your health care provider tells you not to drink. If you drink alcohol: Limit how much you have to 0-1 drink a day. Know how much alcohol is in your drink. In the [place] one drink equals one 12 oz bottle of beer (355 mL), one 5 oz glass of wine (148 mL), or one 1 oz glass of hard liquor (44 mL). Lifestyle Brush your teeth every morning and night with fluoride toothpaste. Floss one time each day. Exercise for at least 30 minutes 5 or more days each week. Do not use any products that contain nicotine or tobacco. These products include cigarettes, chewing tobacco, and vaping devices, such as e-cigarettes. If you need help quitting, ask your health care provider. Do not use drugs. If you are sexually active, practice safe sex. Use a condom or other form of protection in order to prevent STIs. Take aspirin only as told by   your health care provider. Make sure that you understand how much to take and what form to take. Work with your health care provider to find out whether it  is safe and beneficial for you to take aspirin daily. Ask your health care provider if you need to take a cholesterol-lowering medicine (statin). Find healthy ways to manage stress, such as: Meditation, yoga, or listening to music. Journaling. Talking to a trusted person. Spending time with friends and family. Minimize exposure to UV radiation to reduce your risk of skin cancer. Safety Always wear your seat belt while driving or riding in a vehicle. Do not drive: If you have been drinking alcohol. Do not ride with someone who has been drinking. When you are tired or distracted. While texting. If you have been using any mind-altering substances or drugs. Wear a helmet and other protective equipment during sports activities. If you have firearms in your house, make sure you follow all gun safety procedures. What's next? Visit your health care provider once a year for an annual wellness visit. Ask your health care provider how often you should have your eyes and teeth checked. Stay up to date on all vaccines. This information is not intended to replace advice given to you by your health care provider. Make sure you discuss any questions you have with your health care provider. Document Revised: 01/10/2021 Document Reviewed: 01/10/2021 Elsevier Patient Education  2023 [employer]  

## 2022-01-09 NOTE — Progress Notes (Signed)
Subjective:   Michele Tyler is a 76 y.o. female who presents for Medicare Annual (Subsequent) preventive examination.  Review of Systems    Refer to PCP I connected with  Ozzie Hoyle on 01/09/22 by in-person and verified that I am speaking with the correct person using two identifiers.   I discussed the limitations, risks, security and privacy concerns of performing an evaluation and management service by telephone and the availability of in person appointments. I also discussed with the patient that there may be a patient responsible charge related to this service. The patient expressed understanding and verbally consented to this telephonic visit.  Location of Patient: office Location of Provider: office   List any persons and their role that are participating in the visit with the patient.  Michah Minton, CMA       Objective:    Today's Vitals   01/09/22 0817  Weight: 115 lb (52.2 kg)  Height: '5\' 4"'$  (1.626 m)   Body mass index is 19.74 kg/m.     03/25/2018   10:37 AM 07/04/2015    4:34 PM  Advanced Directives  Does Patient Have a Medical Advance Directive? No No  Would patient like information on creating a medical advance directive? No - Patient declined No - patient declined information    Current Medications (verified) Outpatient Encounter Medications as of 01/09/2022  Medication Sig   Apple Cider Vinegar 600 MG CAPS Take 1 capsule by mouth daily.   Ascorbic Acid (VITAMIN C CR) 1500 MG TBCR Take 1 tablet by mouth daily.   atorvastatin (LIPITOR) 10 MG tablet TAKE 1 TABLET BY MOUTH AT  BEDTIME   B Complex-Folic Acid (BALANCED D-664 PO) Take by mouth.   busPIRone (BUSPAR) 7.5 MG tablet TAKE 1 TABLET BY MOUTH 3 TIMES DAILY **PLEASE CONTACT OUR OFFICE TO SCHEDULE A FOLLOW UP FOR FUTURE MED REFILLS**   CALCIUM-MAGNESUIUM-ZINC 333-133-8.3 MG TABS Take 4 tablets by mouth daily. 1200 mg daily   Cholecalciferol (VITAMIN D3) 5000 units CAPS Take 1 capsule by mouth  daily.   Cyanocobalamin (B-12) 1000 MCG CAPS Take 1 capsule by mouth.   Ferrous Sulfate (IRON) 325 (65 Fe) MG TABS Take 1 tablet by mouth daily.   folic acid (FOLVITE) 403 MCG tablet Take 800 mcg by mouth daily. 1333 MCG   losartan-hydrochlorothiazide (HYZAAR) 100-25 MG tablet Take 1 tablet by mouth daily.   Omega-3 Fatty Acids (OMEGA-3 FISH OIL PO) Take 4,000 mg by mouth daily. Contains omega 3 450 mg and fish oil 1500 mg   Soy Isoflavone 750 MG CAPS Take 1 capsule by mouth daily.   TURMERIC PO Take 800 mg by mouth.   vitamin A 8000 UNIT capsule Take 8,000 Units by mouth daily.   VITAMIN E-1000 PO Take 1 capsule by mouth daily.   Vitamins-Lipotropics (BALANCED B-150 COMPLEX TR PO) Take 1 tablet by mouth daily. Contains b1 '150mg'$ , b2 '150mg'$ , b6 '150mg'$ , b12 '150mg'$ , niacin 150 mg   [DISCONTINUED] silver sulfADIAZINE (SILVADENE) 1 % cream Apply 1 application topically daily. (Patient not taking: Reported on 01/09/2022)   No facility-administered encounter medications on file as of 01/09/2022.    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  Tobacco Use   Smoking status: Former    Packs/day: 1.00    Types: Cigarettes    Start date: 07/29/1976    Quit date: 1988    Years since quitting: 35.4   Smokeless tobacco: Never  Vaping Use   Vaping Use: Never used  Substance and Sexual Activity   Alcohol use: Yes    Alcohol/week:  1.0 - 2.0 standard drink of alcohol    Types: 1 - 2 Standard drinks or equivalent per week    Comment: social   Drug use: No   Sexual activity: Not Currently  Other Topics Concern   Not on file  Social History Narrative   Not on file   Social Determinants of Health   Financial Resource Strain: Low Risk  (01/09/2022)   Overall Financial Resource Strain (CARDIA)    Difficulty of Paying Living Expenses: Not very hard  Food Insecurity: No Food Insecurity (01/09/2022)   Hunger Vital Sign    Worried About Running Out of Food in the Last Year: Never true    Ran Out of Food in the Last Year: Never true  Transportation Needs: No Transportation Needs (01/09/2022)   PRAPARE - Hydrologist (Medical): No    Lack of Transportation (Non-Medical): No  Physical Activity: Sufficiently Active (01/09/2022)   Exercise Vital Sign    Days of Exercise per Week: 5 days    Minutes of Exercise per Session: 60 min  Stress: No Stress Concern Present (01/09/2022)   [place]    Feeling of Stress : Not at all  Social Connections: Moderately Isolated (01/09/2022)   Social Connection and Isolation Panel [NHANES]    Frequency of Communication with Friends and Family: More than three times a week    Frequency of Social Gatherings with Friends and Family: Never    Attends Religious Services: Never    Marine scientist or Organizations: Yes    Attends Archivist Meetings: Never    Marital Status: Divorced    Tobacco Counseling Counseling given: Not Answered   Clinical Intake:                 Diabetic?No         Activities of Daily Living    01/09/2022    8:26 AM 05/02/2021    9:50 AM  In your present state of health, do you have any difficulty performing the following activities:  Hearing? 0 0  Vision? 0 0  Difficulty concentrating or making decisions? 1 0  Walking or climbing  stairs? 0 0  Dressing or bathing? 0 0  Doing errands, shopping? 0 0    Patient Care Team: Lorrene Reid, PA-C as PCP - General  Indicate any recent Medical Services you may have received from other than [facility] providers in the past year (date may be approximate).     Assessment:   This is a routine wellness examination for [place].  Hearing/Vision screen No results found.  Dietary issues and exercise activities discussed:     Goals Addressed   None  Depression Screen    01/09/2022    8:25 AM 09/04/2021    8:18 AM 05/02/2021    9:49 AM 12/27/2020    9:05 AM 10/11/2020    9:07 AM 06/07/2020    9:32 AM 02/02/2020   10:13 AM  PHQ 2/9 Scores  PHQ - 2 Score '6 4 4 4 6 4 2  '$ PHQ- 9 Score '11 7 8 8 11 10 4    '$ Fall Risk    01/09/2022    8:25 AM 09/04/2021    8:18 AM 05/02/2021    9:50 AM 12/27/2020    9:04 AM 10/11/2020    9:06 AM  [place] in the past year? 0 1 0 1 1  Number falls in past yr: 0 0 0 0 0  Injury with Fall? 0 0 0 1 1  Risk for fall due to : No Fall Risks   Orthopedic patient No Fall Risks  Follow up Falls evaluation completed Falls evaluation completed Falls evaluation completed Falls evaluation completed     [place]:  Any stairs in or around the home? Yes  If so, are there any without handrails? Yes  Home free of loose throw rugs in walkways, pet beds, electrical cords, etc? Yes  Adequate lighting in your home to reduce risk of falls? Yes   ASSISTIVE DEVICES UTILIZED TO PREVENT FALLS:  Life alert? No  Use of a cane, walker or w/c? No  Grab bars in the bathroom? No  Shower chair or bench in shower? No  Elevated toilet seat or a handicapped toilet? No   TIMED UP AND GO:  Was the test performed? Yes .  Length of time to ambulate 10 feet: 10 sec.   Gait steady and fast without use of assistive device  Cognitive Function:        01/09/2022    8:26 AM 10/11/2020   11:44 AM  6CIT Screen  What Year? 0 points  0 points  What month? 0 points 0 points  What time? 0 points 0 points  Count back from 20 0 points 0 points  Months in reverse 0 points 0 points  Repeat phrase 0 points 0 points  Total Score 0 points 0 points    Immunizations Immunization History  Administered Date(s) Administered   DTaP 09/18/2006   Fluad Quad(high Dose 65+) 04/28/2019, 04/28/2019   Influenza Split 06/21/2013   Influenza, High Dose Seasonal PF 06/12/2015, 08/25/2016, 05/14/2017, 05/21/2018   Influenza, Seasonal, Injecte, Preservative Fre 05/10/2014   Influenza-Unspecified 05/22/2020, 04/25/2021   PFIZER(Purple Top)SARS-COV-2 Vaccination 09/12/2019, 10/05/2019, 05/03/2020, 04/18/2021   Pfizer Covid-19 Vaccine Bivalent Booster 64yr & up 11/01/2020   Pneumococcal Polysaccharide-23 09/07/2013   Tdap 07/04/2015    TDAP status: Up to date  Flu Vaccine status: Up to date  Pneumococcal vaccine status: Up to date  Covid-19 vaccine status: Completed vaccines  Qualifies for Shingles Vaccine? Yes   Zostavax completed No   Shingrix Completed?: No.    Education has been provided regarding the importance of this vaccine. Patient has been advised to call insurance company to determine out of pocket expense if they have not yet received this vaccine. Advised may also receive vaccine at local pharmacy or Health Dept. Verbalized acceptance and understanding.  Screening Tests Health Maintenance  Topic Date Due   Hepatitis C Screening  Never done   Zoster Vaccines- Shingrix (1 of 2) Never done   Pneumonia Vaccine 75 Years old (2 -  PCV) 09/07/2014   COVID-19 Vaccine (5 - Pfizer series) 08/18/2021   INFLUENZA VACCINE  02/26/2022   MAMMOGRAM  05/24/2023   TETANUS/TDAP  07/03/2025   COLONOSCOPY (Pts 45-13yr Insurance coverage will need to be confirmed)  03/31/2028   DEXA SCAN  Completed   HPV VACCINES  Aged Out    Health Maintenance  Health Maintenance Due  Topic Date Due   Hepatitis C Screening  Never done    Zoster Vaccines- Shingrix (1 of 2) Never done   Pneumonia Vaccine 75 Years old (2 - PCV) 09/07/2014   COVID-19 Vaccine (5 - Pfizer series) 08/18/2021    Colorectal cancer screening: Type of screening: Cologuard. Completed 2022. Repeat every 3 years  Mammogram status: Completed 04/2021. Repeat every year  Dexa: Patient declined  Lung Cancer Screening: (Low Dose CT Chest recommended if Age 75-80years, 30 pack-year currently smoking OR have quit w/in 15years.) does not qualify.   Lung Cancer Screening Referral:   Additional Screening:  Hepatitis C Screening: does qualify; Patient declined  Vision Screening: Recommended annual ophthalmology exams for early detection of glaucoma and other disorders of the eye. Is the patient up to date with their annual eye exam?  Yes  Who is the provider or what is the name of the office in which the patient attends annual eye exams[place] is not established with a provider, would they like to be referred to a provider to establish care? No .   Dental Screening: Recommended annual dental exams for proper oral hygiene  Community Resource Referral / Chronic Care Management: CRR required this visit?  No   CCM required this visit?  No      Plan:     I have personally reviewed and noted the following in the patient's chart:   Medical and social history Use of alcohol, tobacco or illicit drugs  Current medications and supplements including opioid prescriptions.  Functional ability and status Nutritional status Physical activity Advanced directives List of other physicians Hospitalizations, surgeries, and ER visits in previous 12 months Vitals Screenings to include cognitive, depression, and falls Referrals and appointments  In addition, I have reviewed and discussed with patient certain preventive protocols, quality metrics, and best practice recommendations. A written personalized care plan for preventive services as well as  general preventive health recommendations were provided to patient.     [place] CMA   01/09/2022   Nurse Notes: Face to Face 15 minutes   Ms. MDoyle Askew, Thank you for taking time to come for your Medicare Wellness Visit. I appreciate your ongoing commitment to your health goals. Please review the following plan we discussed and let me know if I can assist you in the future.   These are the goals we discussed:  Goals   None     This is a list of the screening recommended for you and due dates:  Health Maintenance  Topic Date Due   Hepatitis C Screening: USPSTF Recommendation to screen - Ages 133-79yo.  Never done   Zoster (Shingles) Vaccine (1 of 2) Never done   Pneumonia Vaccine (2 - PCV) 09/07/2014   COVID-19 Vaccine (5 - Pfizer series) 08/18/2021   Flu Shot  02/26/2022   Mammogram  05/24/2023   Tetanus Vaccine  07/03/2025   Colon Cancer Screening  03/31/2028   DEXA scan (bone density measurement)  Completed   HPV Vaccine  Aged Out

## 2022-01-10 LAB — CBC
Hematocrit: 38.8 % (ref 34.0–46.6)
Hemoglobin: 13.3 g/dL (ref 11.1–15.9)
MCH: 30.4 pg (ref 26.6–33.0)
MCHC: 34.3 g/dL (ref 31.5–35.7)
MCV: 89 fL (ref 79–97)
Platelets: 395 10*3/uL (ref 150–450)
RBC: 4.37 x10E6/uL (ref 3.77–5.28)
RDW: 12.8 % (ref 11.7–15.4)
WBC: 4.8 10*3/uL (ref 3.4–10.8)

## 2022-01-10 LAB — COMPREHENSIVE METABOLIC PANEL
ALT: 20 IU/L (ref 0–32)
AST: 21 IU/L (ref 0–40)
Albumin/Globulin Ratio: 2.3 — ABNORMAL HIGH (ref 1.2–2.2)
Albumin: 4.6 g/dL (ref 3.7–4.7)
Alkaline Phosphatase: 90 IU/L (ref 44–121)
BUN/Creatinine Ratio: 14 (ref 12–28)
BUN: 9 mg/dL (ref 8–27)
Bilirubin Total: 0.5 mg/dL (ref 0.0–1.2)
CO2: 28 mmol/L (ref 20–29)
Calcium: 9.6 mg/dL (ref 8.7–10.3)
Chloride: 92 mmol/L — ABNORMAL LOW (ref 96–106)
Creatinine, Ser: 0.65 mg/dL (ref 0.57–1.00)
Globulin, Total: 2 g/dL (ref 1.5–4.5)
Glucose: 82 mg/dL (ref 70–99)
Potassium: 4.8 mmol/L (ref 3.5–5.2)
Sodium: 132 mmol/L — ABNORMAL LOW (ref 134–144)
Total Protein: 6.6 g/dL (ref 6.0–8.5)
eGFR: 92 mL/min/{1.73_m2} (ref >59)

## 2022-01-10 LAB — LIPID PANEL
Chol/HDL Ratio: 1.8 ratio (ref 0.0–4.4)
Cholesterol, Total: 154 mg/dL (ref 100–199)
HDL: 87 mg/dL (ref >39)
LDL Chol Calc (NIH): 57 mg/dL (ref 0–99)
Triglycerides: 44 mg/dL (ref 0–149)
VLDL Cholesterol Cal: 10 mg/dL (ref 5–40)

## 2022-01-10 LAB — HEMOGLOBIN A1C
Est. average glucose Bld gHb Est-mCnc: 120 mg/dL
Hgb A1c MFr Bld: 5.8 % — ABNORMAL HIGH (ref 4.8–5.6)

## 2022-01-10 LAB — VITAMIN B12: Vitamin B-12: 782 pg/mL (ref 232–1245)

## 2022-01-10 LAB — TSH: TSH: 0.544 u[IU]/mL (ref 0.450–4.500)

## 2022-02-21 ENCOUNTER — Encounter: Payer: Self-pay | Admitting: Physician Assistant

## 2022-02-21 ENCOUNTER — Ambulatory Visit (INDEPENDENT_AMBULATORY_CARE_PROVIDER_SITE_OTHER): Payer: Medicare Other | Admitting: Physician Assistant

## 2022-02-21 VITALS — BP 120/68 | HR 79 | Ht 64.17 in | Wt 112.1 lb

## 2022-02-21 DIAGNOSIS — R110 Nausea: Secondary | ICD-10-CM

## 2022-02-21 DIAGNOSIS — R197 Diarrhea, unspecified: Secondary | ICD-10-CM

## 2022-02-21 MED ORDER — ONDANSETRON HCL 4 MG PO TABS: 4.0000 mg | ORAL_TABLET | Freq: Three times a day (TID) | ORAL | 0 refills | Status: DC | PRN

## 2022-02-21 NOTE — Patient Instructions (Signed)
Diarrhea, Adult Diarrhea is frequent loose and watery bowel movements. Diarrhea can make you feel weak and cause you to become dehydrated. Dehydration can make you tired and thirsty, cause you to have a dry mouth, and decrease how often you urinate. Diarrhea typically lasts 2-3 days. However, it can last longer if it is a sign of something more serious. It is important to treat your diarrhea as told by your health care provider. Follow these instructions at home: Eating and drinking     Follow these recommendations as told by your health care provider: Take an oral rehydration solution (ORS). This is an over-the-counter medicine that helps return your body to its normal balance of nutrients and water. It is found at pharmacies and retail stores. Drink plenty of fluids, such as water, ice chips, diluted fruit juice, and low-calorie sports drinks. You can drink milk also, if desired. Avoid drinking fluids that contain a lot of sugar or caffeine, such as energy drinks, sports drinks, and soda. Eat bland, easy-to-digest foods in small amounts as you are able. These foods include bananas, applesauce, rice, lean meats, toast, and crackers. Avoid alcohol. Avoid spicy or fatty foods.  Medicines Take over-the-counter and prescription medicines only as told by your health care provider. If you were prescribed an antibiotic medicine, take it as told by your health care provider. Do not stop using the antibiotic even if you start to feel better. General instructions  Wash your hands often using soap and water. If soap and water are not available, use a hand sanitizer. Others in the household should wash their hands as well. Hands should be washed: After using the toilet or changing a diaper. Before preparing, cooking, or serving food. While caring for a sick person or while visiting someone in a hospital. Drink enough fluid to keep your urine pale yellow. Rest at home while you recover. Watch your  condition for any changes. Take a warm bath to relieve any burning or pain from frequent diarrhea episodes. Keep all follow-up visits as told by your health care provider. This is important. Contact a health care provider if: You have a fever. Your diarrhea gets worse. You have new symptoms. You cannot keep fluids down. You feel light-headed or dizzy. You have a headache. You have muscle cramps. Get help right away if: You have chest pain. You feel extremely weak or you faint. You have bloody or black stools or stools that look like tar. You have severe pain, cramping, or bloating in your abdomen. You have trouble breathing or you are breathing very quickly. Your heart is beating very quickly. Your skin feels cold and clammy. You feel confused. You have signs of dehydration, such as: Dark urine, very little urine, or no urine. Cracked lips. Dry mouth. Sunken eyes. Sleepiness. Weakness. Summary Diarrhea is frequent loose and sometimes watery bowel movements. Diarrhea can make you feel weak and cause you to become dehydrated. Drink enough fluids to keep your urine pale yellow. Make sure that you wash your hands after using the toilet. If soap and water are not available, use hand sanitizer. Contact a health care provider if your diarrhea gets worse or you have new symptoms. Get help right away if you have signs of dehydration. This information is not intended to replace advice given to you by your health care provider. Make sure you discuss any questions you have with your health care provider. Document Revised: 01/24/2021 Document Reviewed: 01/24/2021 Elsevier Patient Education  [place].

## 2022-02-21 NOTE — Progress Notes (Signed)
Established patient acute visit   Patient: Michele Tyler   DOB: 08/10/1945   76 y.o. Female  MRN: [id number] Visit Date: 02/21/2022  Chief Complaint  Patient presents with   Nausea   Subjective    HPI  Patient presents with nausea and diarrhea x 2 weeks. Symptoms started week of July 4th. States 2-3 stools per day. Stools have been loose and watery and then at times more soft and firm. Reports no diarrhea today. Nausea occurs along with the diarrhea. No vomiting, abdominal pain, dysuria, fever, chills or bloody stool. No new medication or supplements. Reports following a bland diet and trying to stay hydrated.      Medications: Outpatient Medications Prior to Visit  Medication Sig   Apple Cider Vinegar 600 MG CAPS Take 1 capsule by mouth daily.   Ascorbic Acid (VITAMIN C CR) 1500 MG TBCR Take 1 tablet by mouth daily.   atorvastatin (LIPITOR) 10 MG tablet Take 1 tablet (10 mg total) by mouth at bedtime.   B Complex-Folic Acid (BALANCED B-150 PO) Take by mouth.   busPIRone (BUSPAR) 7.5 MG tablet TAKE 1 TABLET BY MOUTH 3 TIMES DAILY   CALCIUM-MAGNESUIUM-ZINC 333-133-8.3 MG TABS Take 4 tablets by mouth daily. 1200 mg daily   Cholecalciferol (VITAMIN D3) 5000 units CAPS Take 1 capsule by mouth daily.   Cyanocobalamin (B-12) 1000 MCG CAPS Take 1 capsule by mouth.   Ferrous Sulfate (IRON) 325 (65 Fe) MG TABS Take 1 tablet by mouth daily.   folic acid (FOLVITE) 800 MCG tablet Take 800 mcg by mouth daily. 1333 MCG   losartan-hydrochlorothiazide (HYZAAR) 100-25 MG tablet Take 1 tablet by mouth daily.   Omega-3 Fatty Acids (OMEGA-3 FISH OIL PO) Take 4,000 mg by mouth daily. Contains omega 3 450 mg and fish oil 1500 mg   Soy Isoflavone 750 MG CAPS Take 1 capsule by mouth daily.   TURMERIC PO Take 800 mg by mouth.   vitamin A 8000 UNIT capsule Take 8,000 Units by mouth daily.   VITAMIN E-1000 PO Take 1 capsule by mouth daily.   Vitamins-Lipotropics (BALANCED B-150 COMPLEX TR PO) Take 1  tablet by mouth daily. Contains b1 150mg, b2 150mg, b6 150mg, b12 150mg, niacin 150 mg   No facility-administered medications prior to visit.    Review of Systems Review of Systems:  A fourteen system review of systems was performed and found to be positive as per HPI.  Last CBC Lab Results  Component Value Date   WBC 4.8 01/09/2022   HGB 13.3 01/09/2022   HCT 38.8 01/09/2022   MCV 89 01/09/2022   MCH 30.4 01/09/2022   RDW 12.8 01/09/2022   PLT 395 01/09/2022   Last metabolic panel Lab Results  Component Value Date   GLUCOSE 82 01/09/2022   NA 132 (L) 01/09/2022   K 4.8 01/09/2022   CL 92 (L) 01/09/2022   CO2 28 01/09/2022   BUN 9 01/09/2022   CREATININE 0.65 01/09/2022   EGFR 92 01/09/2022   CALCIUM 9.6 01/09/2022   PROT 6.6 01/09/2022   ALBUMIN 4.6 01/09/2022   LABGLOB 2.0 01/09/2022   AGRATIO 2.3 (H) 01/09/2022   BILITOT 0.5 01/09/2022   ALKPHOS 90 01/09/2022   AST 21 01/09/2022   ALT 20 01/09/2022   Last lipids Lab Results  Component Value Date   CHOL 154 01/09/2022   HDL 87 01/09/2022   LDLCALC 57 01/09/2022   TRIG 44 01/09/2022   CHOLHDL 1.8 01/09/2022         Objective    BP 120/68   Pulse 79   Ht 5' 4.17" (1.63 m)   Wt 112 lb 1.9 oz (50.9 kg)   SpO2 96%   BMI 19.14 kg/m    Physical Exam  General:  Well Developed, well nourished, appropriate for stated age.  Neuro:  Alert and oriented,  extra-ocular muscles intact  HEENT:  Normocephalic, atraumatic, neck supple  Skin:  no gross rash, warm, pink. Abdomen: non-tender, non-distended, +BS, no LLQ tenderness  Cardiac:  RRR, S1 S2 Respiratory: CTA B/L  Vascular:  Ext warm, no cyanosis apprec.; cap RF less 2 sec. Psych:  No HI/SI, judgement and insight good, Euthymic mood. Full Affect.   No results found for any visits on 02/21/22.  Assessment & Plan     Discussed with patient s/sx suggestive of gastroenteritis likely viral. No diarrhea today. Will collect stool studies to r/o other  etiologies. Recommend to take Zofran as needed for nausea. Discussed adequate hydration including electrolytes. Advance diet as tolerating.    Return if symptoms worsen or fail to improve.        Lorrene Reid, PA-C  [facility] at [facility] [phone] (phone) [phone] (fax)  [place]

## 2022-03-05 ENCOUNTER — Other Ambulatory Visit: Payer: Self-pay | Admitting: Physician Assistant

## 2022-03-05 DIAGNOSIS — E785 Hyperlipidemia, unspecified: Secondary | ICD-10-CM

## 2022-03-12 ENCOUNTER — Other Ambulatory Visit: Payer: Self-pay | Admitting: Physician Assistant

## 2022-03-12 DIAGNOSIS — I10 Essential (primary) hypertension: Secondary | ICD-10-CM

## 2022-03-28 ENCOUNTER — Other Ambulatory Visit: Payer: Self-pay | Admitting: Physician Assistant

## 2022-03-28 DIAGNOSIS — F39 Unspecified mood [affective] disorder: Secondary | ICD-10-CM

## 2022-05-23 ENCOUNTER — Other Ambulatory Visit: Payer: Self-pay | Admitting: Physician Assistant

## 2022-05-23 DIAGNOSIS — I10 Essential (primary) hypertension: Secondary | ICD-10-CM

## 2022-06-03 LAB — HM MAMMOGRAPHY

## 2022-06-05 ENCOUNTER — Encounter: Payer: Self-pay | Admitting: Physician Assistant

## 2022-06-14 ENCOUNTER — Other Ambulatory Visit: Payer: Self-pay | Admitting: Physician Assistant

## 2022-06-14 DIAGNOSIS — F39 Unspecified mood [affective] disorder: Secondary | ICD-10-CM

## 2022-09-02 ENCOUNTER — Other Ambulatory Visit: Payer: Self-pay

## 2022-09-02 DIAGNOSIS — F39 Unspecified mood [affective] disorder: Secondary | ICD-10-CM

## 2022-09-04 ENCOUNTER — Telehealth: Payer: Self-pay

## 2022-09-04 NOTE — Telephone Encounter (Signed)
Pt is requesting busPIRone (BUSPAR) 7.5 MG tablet. Pharmacy: [facility] ([place], [place]

## 2022-09-05 ENCOUNTER — Other Ambulatory Visit: Payer: Self-pay

## 2022-09-05 DIAGNOSIS — F39 Unspecified mood [affective] disorder: Secondary | ICD-10-CM

## 2022-09-05 MED ORDER — BUSPIRONE HCL 7.5 MG PO TABS: 7.5000 mg | ORAL_TABLET | Freq: Three times a day (TID) | ORAL | 0 refills | Status: DC

## 2022-09-05 NOTE — Telephone Encounter (Signed)
Pt needs an appt

## 2022-09-05 NOTE — Telephone Encounter (Signed)
Ok thanks Rx has been sent to [facility]

## 2022-10-29 ENCOUNTER — Other Ambulatory Visit: Payer: Self-pay

## 2022-10-29 DIAGNOSIS — E785 Hyperlipidemia, unspecified: Secondary | ICD-10-CM

## 2022-10-29 MED ORDER — ATORVASTATIN CALCIUM 10 MG PO TABS: 10.0000 mg | ORAL_TABLET | Freq: Every day | ORAL | 1 refills | Status: DC

## 2022-11-21 ENCOUNTER — Other Ambulatory Visit: Payer: Self-pay | Admitting: Nurse Practitioner

## 2022-11-21 DIAGNOSIS — F39 Unspecified mood [affective] disorder: Secondary | ICD-10-CM

## 2022-12-09 ENCOUNTER — Telehealth: Payer: Self-pay | Admitting: *Deleted

## 2022-12-09 NOTE — Telephone Encounter (Signed)
LVM for pt to call office, wanting to see about changing her AWV to be on a Tues/Thurs with the AWV team, It would need to be the week after due to it being a year.  So first day available would be 6/18.Please change if pt is agreeable.

## 2022-12-11 ENCOUNTER — Other Ambulatory Visit: Payer: Self-pay | Admitting: Family Medicine

## 2022-12-11 DIAGNOSIS — E611 Iron deficiency: Secondary | ICD-10-CM

## 2022-12-11 DIAGNOSIS — Z0000 Encounter for general adult medical examination without abnormal findings: Secondary | ICD-10-CM

## 2022-12-11 DIAGNOSIS — I10 Essential (primary) hypertension: Secondary | ICD-10-CM

## 2023-01-03 ENCOUNTER — Ambulatory Visit (INDEPENDENT_AMBULATORY_CARE_PROVIDER_SITE_OTHER): Payer: Medicare Other | Admitting: Family Medicine

## 2023-01-03 ENCOUNTER — Other Ambulatory Visit: Payer: Medicare Other

## 2023-01-03 VITALS — BP 130/74 | HR 74

## 2023-01-03 DIAGNOSIS — Z0000 Encounter for general adult medical examination without abnormal findings: Secondary | ICD-10-CM | POA: Diagnosis not present

## 2023-01-03 DIAGNOSIS — E611 Iron deficiency: Secondary | ICD-10-CM

## 2023-01-03 DIAGNOSIS — I10 Essential (primary) hypertension: Secondary | ICD-10-CM

## 2023-01-03 DIAGNOSIS — R7303 Prediabetes: Secondary | ICD-10-CM | POA: Diagnosis not present

## 2023-01-03 NOTE — Progress Notes (Signed)
Pt denies CP, SOB, dizziness, or heart palpitations. taking meds as directed without problems. Denies med side effects. 5 min spent with pt. 

## 2023-01-04 ENCOUNTER — Other Ambulatory Visit: Payer: Self-pay | Admitting: Family Medicine

## 2023-01-04 DIAGNOSIS — E785 Hyperlipidemia, unspecified: Secondary | ICD-10-CM

## 2023-01-04 LAB — COMPREHENSIVE METABOLIC PANEL
ALT: 15 IU/L (ref 0–32)
AST: 20 IU/L (ref 0–40)
Albumin/Globulin Ratio: 2.1 (ref 1.2–2.2)
Albumin: 4.6 g/dL (ref 3.8–4.8)
Alkaline Phosphatase: 92 IU/L (ref 44–121)
BUN/Creatinine Ratio: 12 (ref 12–28)
BUN: 8 mg/dL (ref 8–27)
Bilirubin Total: 0.6 mg/dL (ref 0.0–1.2)
CO2: 23 mmol/L (ref 20–29)
Calcium: 9.1 mg/dL (ref 8.7–10.3)
Chloride: 94 mmol/L — ABNORMAL LOW (ref 96–106)
Creatinine, Ser: 0.69 mg/dL (ref 0.57–1.00)
Globulin, Total: 2.2 g/dL (ref 1.5–4.5)
Glucose: 83 mg/dL (ref 70–99)
Potassium: 4.3 mmol/L (ref 3.5–5.2)
Sodium: 132 mmol/L — ABNORMAL LOW (ref 134–144)
Total Protein: 6.8 g/dL (ref 6.0–8.5)
eGFR: 90 mL/min/{1.73_m2} (ref >59)

## 2023-01-04 LAB — CBC WITH DIFFERENTIAL/PLATELET
Basophils Absolute: 0 10*3/uL (ref 0.0–0.2)
Basos: 1 %
EOS (ABSOLUTE): 0 10*3/uL (ref 0.0–0.4)
Eos: 1 %
Hematocrit: 39.7 % (ref 34.0–46.6)
Hemoglobin: 13 g/dL (ref 11.1–15.9)
Immature Grans (Abs): 0 10*3/uL (ref 0.0–0.1)
Immature Granulocytes: 0 %
Lymphocytes Absolute: 0.8 10*3/uL (ref 0.7–3.1)
Lymphs: 19 %
MCH: 29.5 pg (ref 26.6–33.0)
MCHC: 32.7 g/dL (ref 31.5–35.7)
MCV: 90 fL (ref 79–97)
Monocytes Absolute: 0.5 10*3/uL (ref 0.1–0.9)
Monocytes: 11 %
Neutrophils Absolute: 2.8 10*3/uL (ref 1.4–7.0)
Neutrophils: 68 %
Platelets: 353 10*3/uL (ref 150–450)
RBC: 4.41 x10E6/uL (ref 3.77–5.28)
RDW: 12.7 % (ref 11.7–15.4)
WBC: 4.1 10*3/uL (ref 3.4–10.8)

## 2023-01-04 LAB — HEMOGLOBIN A1C
Est. average glucose Bld gHb Est-mCnc: 114 mg/dL
Hgb A1c MFr Bld: 5.6 % (ref 4.8–5.6)

## 2023-01-04 LAB — LIPID PANEL
Chol/HDL Ratio: 2 ratio (ref 0.0–4.4)
Cholesterol, Total: 165 mg/dL (ref 100–199)
HDL: 83 mg/dL (ref >39)
LDL Chol Calc (NIH): 68 mg/dL (ref 0–99)
Triglycerides: 72 mg/dL (ref 0–149)
VLDL Cholesterol Cal: 14 mg/dL (ref 5–40)

## 2023-01-04 LAB — TSH: TSH: 0.57 u[IU]/mL (ref 0.450–4.500)

## 2023-01-09 ENCOUNTER — Ambulatory Visit (INDEPENDENT_AMBULATORY_CARE_PROVIDER_SITE_OTHER): Payer: Medicare Other

## 2023-01-09 VITALS — Ht 64.17 in | Wt 109.9 lb

## 2023-01-09 DIAGNOSIS — Z0000 Encounter for general adult medical examination without abnormal findings: Secondary | ICD-10-CM

## 2023-01-09 NOTE — Progress Notes (Signed)
Subjective:   Jazzy Holderbaum is a 77 y.o. female who presents for Medicare Annual (Subsequent) preventive examination.  Review of Systems    Virtual Visit via Telephone Note  I connected with  Nestor Lewandowsky on 01/09/23 at  3:30 PM EDT by telephone and verified that I am speaking with the correct person using two identifiers.  Location: Patient: Home Provider: Office Persons participating in the virtual visit: patient/Nurse Health Advisor   I discussed the limitations, risks, security and privacy concerns of performing an evaluation and management service by telephone and the availability of in person appointments. The patient expressed understanding and agreed to proceed.  Interactive audio and video telecommunications were attempted between this nurse and patient, however failed, due to patient having technical difficulties OR patient did not have access to video capability.  We continued and completed visit with audio only.  Some vital signs may be absent or patient reported.   Tillie Rung, LPN  Cardiac Risk Factors include: advanced age (>42men, >73 women);hypertension     Objective:    Today's Vitals   01/09/23 1538  Weight: 109 lb 14.4 oz (49.9 kg)  Height: 5' 4.17" (1.63 m)   Body mass index is 18.76 kg/m.     01/09/2023    3:48 PM 03/25/2018   10:37 AM 07/04/2015    4:34 PM  Advanced Directives  Does Patient Have a Medical Advance Directive? Yes No No  Type of Estate agent of [place];Living will    Copy of Healthcare Power of Attorney in Chart? No - copy requested    Would patient like information on creating a medical advance directive?  No - Patient declined No - patient declined information    Current Medications (verified) Outpatient Encounter Medications as of 01/09/2023  Medication Sig   Apple Cider Vinegar 600 MG CAPS Take 1 capsule by mouth daily.   Ascorbic Acid (VITAMIN C CR) 1500 MG TBCR Take 1 tablet by mouth daily.    atorvastatin (LIPITOR) 10 MG tablet TAKE 1 TABLET BY MOUTH AT  BEDTIME   B Complex-Folic Acid (BALANCED B-150 PO) Take by mouth.   busPIRone (BUSPAR) 7.5 MG tablet TAKE 1 TABLET BY MOUTH 3 TIMES  DAILY   CALCIUM-MAGNESUIUM-ZINC 333-133-8.3 MG TABS Take 4 tablets by mouth daily. 1200 mg daily   Cholecalciferol (VITAMIN D3) 5000 units CAPS Take 1 capsule by mouth daily.   Cyanocobalamin (B-12) 1000 MCG CAPS Take 1 capsule by mouth.   Ferrous Sulfate (IRON) 325 (65 Fe) MG TABS Take 1 tablet by mouth daily.   folic acid (FOLVITE) 800 MCG tablet Take 800 mcg by mouth daily. 1333 MCG   losartan-hydrochlorothiazide (HYZAAR) 100-25 MG tablet TAKE 1 TABLET BY MOUTH DAILY   Omega-3 Fatty Acids (OMEGA-3 FISH OIL PO) Take 4,000 mg by mouth daily. Contains omega 3 450 mg and fish oil 1500 mg   ondansetron (ZOFRAN) 4 MG tablet Take 1 tablet (4 mg total) by mouth every 8 (eight) hours as needed for nausea or vomiting.   Soy Isoflavone 750 MG CAPS Take 1 capsule by mouth daily.   TURMERIC PO Take 800 mg by mouth.   vitamin A 8000 UNIT capsule Take 8,000 Units by mouth daily.   VITAMIN E-1000 PO Take 1 capsule by mouth daily.   Vitamins-Lipotropics (BALANCED B-150 COMPLEX TR PO) Take 1 tablet by mouth daily. Contains b1 150mg , b2 150mg , b6 150mg , b12 150mg , niacin 150 mg   No facility-administered encounter medications on file as of  01/09/2023.    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  Tobacco Use   Smoking status: Former    Packs/day: 1    Types: Cigarettes    Start date: 07/29/1976    Quit date: 1988    Years since quitting: 36.4   Smokeless tobacco: Never  Vaping Use   Vaping Use: Never used  Substance and Sexual Activity   Alcohol use: Yes    Alcohol/week: 1.0 - 2.0 standard drink of alcohol    Types: 1 - 2 Standard drinks or equivalent per week    Comment: social   Drug use: No   Sexual activity: Not Currently  Other Topics Concern   Not on file  Social History Narrative   Not on file   Social Determinants of Health   Financial Resource Strain: Low Risk  (01/09/2023)   Overall Financial Resource Strain (CARDIA)    Difficulty of Paying Living Expenses: Not hard at all  Food Insecurity: No Food Insecurity (01/09/2023)   Hunger Vital Sign    Worried About Running Out of Food in the Last Year: Never true    Ran Out of Food in the Last Year: Never true  Transportation Needs: No Transportation Needs (01/09/2023)   PRAPARE - Administrator, Civil Service (Medical): No    Lack of Transportation (Non-Medical): No  Physical Activity: Sufficiently Active (01/09/2023)   Exercise Vital Sign    Days of Exercise per Week: 5 days    Minutes of Exercise per Session: 90 min  Stress: No Stress Concern Present (01/09/2023)   [employer] of Occupational Health - Occupational Stress Questionnaire    Feeling of Stress : Not at all  Social Connections: Moderately Integrated (01/09/2023)   Social Connection and Isolation Panel [NHANES]    Frequency of Communication with Friends and Family: More than three times a week    Frequency of Social Gatherings with Friends and Family: More than three times a week    Attends Religious Services: More than 4 times per year    Active Member of [employer] or Organizations: Yes    Attends Engineer, structural: More than 4 times per year    Marital Status:  Divorced    Tobacco Counseling Counseling given: Not Answered   Clinical Intake:  Pre-visit preparation completed: No  Pain : No/denies pain     BMI - recorded: 18.76 Nutritional Status: BMI <19  Underweight Nutritional Risks: None Diabetes: No  How often do you need to have someone help you when you read instructions, pamphlets, or other written materials from your doctor or pharmacy?: 1 - Never  Diabetic?  No  Interpreter Needed?: No  Information entered by :: Theresa Mulligan LPN   Activities of Daily Living    01/09/2023  3:45 PM  In your present state of health, do you have any difficulty performing the following activities:  Hearing? 0  Vision? 0  Difficulty concentrating or making decisions? 0  Walking or climbing stairs? 0  Dressing or bathing? 0  Doing errands, shopping? 0  Preparing Food and eating ? N  Using the Toilet? N  In the past six months, have you accidently leaked urine? N  Do you have problems with loss of bowel control? N  Managing your Medications? N  Managing your Finances? N  Housekeeping or managing your Housekeeping? N    Patient Care Team: Melida Quitter, PA as PCP - General (Family Medicine)  Indicate any recent Medical Services you may have received from other than [facility] providers in the past year (date may be approximate).     Assessment:   This is a routine wellness examination for [place].  Hearing/Vision screen Hearing Screening - Comments:: Denies hearing difficulties   Vision Screening - Comments:: Wears rx glasses - up to date with [facility] exams with  [facility]  Dietary issues and exercise activities discussed: Exercise limited by: None identified   Goals Addressed               This Visit's Progress     Maintain exercise (pt-stated)         Depression Screen    01/09/2023    3:44 PM 01/09/2022    8:25 AM 09/04/2021    8:18 AM 05/02/2021    9:49 AM 12/27/2020    9:05 AM 10/11/2020    9:07 AM  06/07/2020    9:32 AM  PHQ 2/9 Scores  PHQ - 2 Score 0 6 4 4 4 6 4   PHQ- 9 Score  11 7 8 8 11 10     Fall Risk    01/09/2023    3:47 PM 01/09/2022    8:25 AM 09/04/2021    8:18 AM 05/02/2021    9:50 AM 12/27/2020    9:04 AM  Fall Risk   Falls in the past year? 0 0 1 0 1  Number falls in past yr: 0 0 0 0 0  Injury with Fall? 0 0 0 0 1  Risk for fall due to : No Fall Risks No Fall Risks   Orthopedic patient  Follow up Falls prevention discussed Falls evaluation completed Falls evaluation completed Falls evaluation completed Falls evaluation completed    FALL RISK PREVENTION PERTAINING TO THE HOME:  Any stairs in or around the home? Yes  If so, are there any without handrails? No  Home free of loose throw rugs in walkways, pet beds, electrical cords, etc? Yes  Adequate lighting in your home to reduce risk of falls? Yes   ASSISTIVE DEVICES UTILIZED TO PREVENT FALLS:  Life alert? No  Use of a cane, walker or w/c? No  Grab bars in the bathroom? No  Shower chair or bench in shower? No  Elevated toilet seat or a handicapped toilet? No   TIMED UP AND GO:  Was the test performed? No .Audio Visit   Cognitive Function:        01/09/2023    3:48 PM 01/09/2022    8:26 AM 10/11/2020   11:44 AM  6CIT Screen  What Year? 0 points 0 points 0 points  What month? 0 points 0 points 0 points  What time? 0 points 0 points 0 points  Count back from 20 0 points 0 points 0 points  Months in reverse 0 points 0 points 0 points  Repeat phrase 0 points 0 points 0 points  Total Score 0 points 0 points 0 points    Immunizations Immunization History  Administered Date(s) Administered   DTaP 09/18/2006   Fluad Quad(high Dose 65+) 04/28/2019, 04/28/2019   Influenza Split 06/21/2013   Influenza, High Dose Seasonal PF 06/12/2015, 08/25/2016, 05/14/2017, 05/21/2018   Influenza, Seasonal, Injecte, Preservative Fre 05/10/2014   Influenza-Unspecified 05/22/2020, 04/25/2021   PFIZER(Purple  Top)SARS-COV-2 Vaccination 09/12/2019, 10/05/2019, 05/03/2020, 04/18/2021   Pfizer Covid-19 Vaccine Bivalent Booster 70yrs & up 11/01/2020   Pneumococcal Polysaccharide-23 09/07/2013   Tdap 07/04/2015    TDAP status: Up to date  Flu Vaccine status: Up to date  Pneumococcal vaccine status: Due, Education has been provided regarding the importance of this vaccine. Advised may receive this vaccine at local pharmacy or Health Dept. Aware to provide a copy of the vaccination record if obtained from local pharmacy or Health Dept. Verbalized acceptance and understanding.  Covid-19 vaccine status: Completed vaccines  Qualifies for Shingles Vaccine? Yes   Zostavax completed No   Shingrix Completed?: No.    Education has been provided regarding the importance of this vaccine. Patient has been advised to call insurance company to determine out of pocket expense if they have not yet received this vaccine. Advised may also receive vaccine at local pharmacy or Health Dept. Verbalized acceptance and understanding.  Screening Tests Health Maintenance  Topic Date Due   Hepatitis C Screening  Never done   Zoster Vaccines- Shingrix (1 of 2) Never done   Pneumonia Vaccine 43+ Years old (2 of 2 - PCV) 09/07/2014   COVID-19 Vaccine (6 - 2023-24 season) 01/25/2023 (Originally 03/29/2022)   INFLUENZA VACCINE  02/27/2023   Medicare Annual Wellness (AWV)  01/09/2024   DTaP/Tdap/Td (3 - Td or Tdap) 07/03/2025   Colonoscopy  03/31/2028   DEXA SCAN  Completed   HPV VACCINES  Aged Out    Health Maintenance  Health Maintenance Due  Topic Date Due   Hepatitis C Screening  Never done   Zoster Vaccines- Shingrix (1 of 2) Never done   Pneumonia Vaccine 63+ Years old (2 of 2 - PCV) 09/07/2014    Colorectal cancer screening: No longer required.   Mammogram status: No longer required due to Age.  Bone Density status: Completed 10/05/13. Results reflect: Bone density results: OSTEOPENIA. Repeat every    years.  Lung Cancer Screening: (Low Dose CT Chest recommended if Age 72-80 years, 30 pack-year currently smoking OR have quit w/in 15years.) does not qualify.    Additional Screening:  Hepatitis C Screening: does qualify;  Deferred  Vision Screening: Recommended annual ophthalmology exams for early detection of glaucoma and other disorders of the eye. Is the patient up to date with their annual eye exam?  Yes  Who is the provider or what is the name of the office in which the patient attends annual eye exams? [facility] If pt is not established with a provider, would they like to be referred to a provider to establish care? No .   Dental Screening: Recommended annual dental exams for proper oral hygiene  Community Resource Referral / Chronic Care Management:  CRR required this visit?  No   CCM required this visit?  No      Plan:     I have personally reviewed and noted the following in the patient's chart:   Medical and social history Use of alcohol, tobacco or illicit drugs  Current medications and supplements including opioid prescriptions. Patient is not currently taking opioid prescriptions. Functional ability and status Nutritional status Physical activity Advanced directives List of other physicians Hospitalizations, surgeries, and ER visits in previous 12 months Vitals Screenings to include cognitive, depression, and falls Referrals and appointments  In addition, I have reviewed and discussed with patient certain preventive protocols, quality metrics, and best practice recommendations. A written personalized care plan for preventive services as well as general preventive health recommendations were provided to patient.     Tillie Rung, LPN   1/61/0960   Nurse Notes: Patient due Hep-C Screening. Patient request f/u with concerns of updating anti depression medication.

## 2023-01-09 NOTE — Patient Instructions (Addendum)
[name] , Thank you for taking time to come for your Medicare Wellness Visit. I appreciate your ongoing commitment to your health goals. Please review the following plan we discussed and let me know if I can assist you in the future.   These are the goals we discussed:  Goals       Maintain exercise (pt-stated)        This is a list of the screening recommended for you and due dates:  Health Maintenance  Topic Date Due   Hepatitis C Screening  Never done   Zoster (Shingles) Vaccine (1 of 2) Never done   Pneumonia Vaccine (2 of 2 - PCV) 09/07/2014   COVID-19 Vaccine (6 - 2023-24 season) 01/25/2023*   Flu Shot  02/27/2023   Medicare Annual Wellness Visit  01/09/2024   DTaP/Tdap/Td vaccine (3 - Td or Tdap) 07/03/2025   Colon Cancer Screening  03/31/2028   DEXA scan (bone density measurement)  Completed   HPV Vaccine  Aged Out  *Topic was postponed. The date shown is not the original due date.    Advanced directives: Please bring a copy of your health care power of attorney and living will to the office to be added to your chart at your convenience.   Conditions/risks identified: None  Next appointment: Follow up in one year for your annual wellness visit     Preventive Care 65 Years and Older, Female Preventive care refers to lifestyle choices and visits with your health care provider that can promote health and wellness. What does preventive care include? A yearly physical exam. This is also called an annual well check. Dental exams once or twice a year. Routine eye exams. Ask your health care provider how often you should have your eyes checked. Personal lifestyle choices, including: Daily care of your teeth and gums. Regular physical activity. Eating a healthy diet. Avoiding tobacco and drug use. Limiting alcohol use. Practicing safe sex. Taking low-dose aspirin every day. Taking vitamin and mineral supplements as recommended by your health care provider. What  happens during an annual well check? The services and screenings done by your health care provider during your annual well check will depend on your age, overall health, lifestyle risk factors, and family history of disease. Counseling  Your health care provider may ask you questions about your: Alcohol use. Tobacco use. Drug use. Emotional well-being. Home and relationship well-being. Sexual activity. Eating habits. History of falls. Memory and ability to understand (cognition). Work and work Astronomer. Reproductive health. Screening  You may have the following tests or measurements: Height, weight, and BMI. Blood pressure. Lipid and cholesterol levels. These may be checked every 5 years, or more frequently if you are over 60 years old. Skin check. Lung cancer screening. You may have this screening every year starting at age 5 if you have a 30-pack-year history of smoking and currently smoke or have quit within the past 15 years. Fecal occult blood test (FOBT) of the stool. You may have this test every year starting at age 22. Flexible sigmoidoscopy or colonoscopy. You may have a sigmoidoscopy every 5 years or a colonoscopy every 10 years starting at age 27. Hepatitis C blood test. Hepatitis B blood test. Sexually transmitted disease (STD) testing. Diabetes screening. This is done by checking your blood sugar (glucose) after you have not eaten for a while (fasting). You may have this done every 1-3 years. Bone density scan. This is done to screen for osteoporosis. You may have this  done starting at age 95. Mammogram. This may be done every 1-2 years. Talk to your health care provider about how often you should have regular mammograms. Talk with your health care provider about your test results, treatment options, and if necessary, the need for more tests. Vaccines  Your health care provider may recommend certain vaccines, such as: Influenza vaccine. This is recommended every  year. Tetanus, diphtheria, and acellular pertussis (Tdap, Td) vaccine. You may need a Td booster every 10 years. Zoster vaccine. You may need this after age 20. Pneumococcal 13-valent conjugate (PCV13) vaccine. One dose is recommended after age 13. Pneumococcal polysaccharide (PPSV23) vaccine. One dose is recommended after age 81. Talk to your health care provider about which screenings and vaccines you need and how often you need them. This information is not intended to replace advice given to you by your health care provider. Make sure you discuss any questions you have with your health care provider. Document Released: 08/11/2015 Document Revised: 04/03/2016 Document Reviewed: 05/16/2015 Elsevier Interactive Patient Education  2017 [place] Prevention in the Home Falls can cause injuries. They can happen to people of all ages. There are many things you can do to make your home safe and to help prevent falls. What can I do on the outside of my home? Regularly fix the edges of walkways and driveways and fix any cracks. Remove anything that might make you trip as you walk through a door, such as a raised step or threshold. Trim any bushes or trees on the path to your home. Use bright outdoor lighting. Clear any walking paths of anything that might make someone trip, such as rocks or tools. Regularly check to see if handrails are loose or broken. Make sure that both sides of any steps have handrails. Any raised decks and porches should have guardrails on the edges. Have any leaves, snow, or ice cleared regularly. Use sand or salt on walking paths during winter. Clean up any spills in your garage right away. This includes oil or grease spills. What can I do in the bathroom? Use night lights. Install grab bars by the toilet and in the tub and shower. Do not use towel bars as grab bars. Use non-skid mats or decals in the tub or shower. If you need to sit down in the shower, use a  plastic, non-slip stool. Keep the floor dry. Clean up any water that spills on the floor as soon as it happens. Remove soap buildup in the tub or shower regularly. Attach bath mats securely with double-sided non-slip rug tape. Do not have throw rugs and other things on the floor that can make you trip. What can I do in the bedroom? Use night lights. Make sure that you have a light by your bed that is easy to reach. Do not use any sheets or blankets that are too big for your bed. They should not hang down onto the floor. Have a firm chair that has side arms. You can use this for support while you get dressed. Do not have throw rugs and other things on the floor that can make you trip. What can I do in the kitchen? Clean up any spills right away. Avoid walking on wet floors. Keep items that you use a lot in easy-to-reach places. If you need to reach something above you, use a strong step stool that has a grab bar. Keep electrical cords out of the way. Do not use floor polish or wax  that makes floors slippery. If you must use wax, use non-skid floor wax. Do not have throw rugs and other things on the floor that can make you trip. What can I do with my stairs? Do not leave any items on the stairs. Make sure that there are handrails on both sides of the stairs and use them. Fix handrails that are broken or loose. Make sure that handrails are as long as the stairways. Check any carpeting to make sure that it is firmly attached to the stairs. Fix any carpet that is loose or worn. Avoid having throw rugs at the top or bottom of the stairs. If you do have throw rugs, attach them to the floor with carpet tape. Make sure that you have a light switch at the top of the stairs and the bottom of the stairs. If you do not have them, ask someone to add them for you. What else can I do to help prevent falls? Wear shoes that: Do not have high heels. Have rubber bottoms. Are comfortable and fit you  well. Are closed at the toe. Do not wear sandals. If you use a stepladder: Make sure that it is fully opened. Do not climb a closed stepladder. Make sure that both sides of the stepladder are locked into place. Ask someone to hold it for you, if possible. Clearly mark and make sure that you can see: Any grab bars or handrails. First and last steps. Where the edge of each step is. Use tools that help you move around (mobility aids) if they are needed. These include: Canes. Walkers. Scooters. Crutches. Turn on the lights when you go into a dark area. Replace any light bulbs as soon as they burn out. Set up your furniture so you have a clear path. Avoid moving your furniture around. If any of your floors are uneven, fix them. If there are any pets around you, be aware of where they are. Review your medicines with your doctor. Some medicines can make you feel dizzy. This can increase your chance of falling. Ask your doctor what other things that you can do to help prevent falls. This information is not intended to replace advice given to you by your health care provider. Make sure you discuss any questions you have with your health care provider. Document Released: 05/11/2009 Document Revised: 12/21/2015 Document Reviewed: 08/19/2014 Elsevier Interactive Patient Education  2017 [employer].

## 2023-01-10 ENCOUNTER — Encounter: Payer: Medicare Other | Admitting: Family Medicine

## 2023-01-23 ENCOUNTER — Other Ambulatory Visit: Payer: Self-pay

## 2023-01-23 DIAGNOSIS — I10 Essential (primary) hypertension: Secondary | ICD-10-CM

## 2023-01-23 MED ORDER — LOSARTAN POTASSIUM-HCTZ 100-25 MG PO TABS
1.0000 | ORAL_TABLET | Freq: Every day | ORAL | 3 refills | Status: DC
Start: 2023-01-23 — End: 2023-10-21

## 2023-01-31 DIAGNOSIS — H2513 Age-related nuclear cataract, bilateral: Secondary | ICD-10-CM | POA: Diagnosis not present

## 2023-02-17 ENCOUNTER — Other Ambulatory Visit: Payer: Self-pay | Admitting: Nurse Practitioner

## 2023-02-17 DIAGNOSIS — F39 Unspecified mood [affective] disorder: Secondary | ICD-10-CM

## 2023-06-09 DIAGNOSIS — R92323 Mammographic fibroglandular density, bilateral breasts: Secondary | ICD-10-CM | POA: Diagnosis not present

## 2023-06-09 DIAGNOSIS — Z1231 Encounter for screening mammogram for malignant neoplasm of breast: Secondary | ICD-10-CM | POA: Diagnosis not present

## 2023-06-09 LAB — HM MAMMOGRAPHY

## 2023-06-10 ENCOUNTER — Encounter: Payer: Self-pay | Admitting: Family Medicine

## 2023-08-05 ENCOUNTER — Encounter: Payer: Self-pay | Admitting: Family Medicine

## 2023-08-05 ENCOUNTER — Ambulatory Visit (INDEPENDENT_AMBULATORY_CARE_PROVIDER_SITE_OTHER): Payer: Medicare Other | Admitting: Family Medicine

## 2023-08-05 VITALS — BP 130/66 | HR 78 | Ht 64.0 in | Wt 118.8 lb

## 2023-08-05 DIAGNOSIS — L299 Pruritus, unspecified: Secondary | ICD-10-CM | POA: Diagnosis not present

## 2023-08-05 MED ORDER — KETOCONAZOLE 2 % EX SHAM
MEDICATED_SHAMPOO | CUTANEOUS | 11 refills | Status: DC
Start: 2023-08-05 — End: 2024-04-08

## 2023-08-05 MED ORDER — FLUOCINOLONE ACETONIDE SCALP 0.01 % EX OIL
TOPICAL_OIL | CUTANEOUS | 2 refills | Status: DC
Start: 2023-08-05 — End: 2024-05-11

## 2023-08-05 NOTE — Progress Notes (Signed)
   Acute Office Visit  Subjective:     Patient ID: Michele Tyler, female    DOB: 10/15/1945, 78 y.o.   MRN: [id number]  Chief Complaint  Patient presents with   Hair/Scalp Problem    HPI Patient is in today for itching scalp.  Her scalp has been itching for about 2 weeks.  It occurs in many locations throughout her scalp and seems to change frequently.  Denies any changes in shampoo, conditioner, recent moves with new water source.  This has never happened before.  She denies any contacts with similar symptoms.  She did try Head and Shoulders shampoo which seemed to make the itching worse.  Denies fever, URI symptoms, itching or rash elsewhere on her body.  She is still taking fexofenadine daily for allergies.  ROS See HPI    Objective:    BP 130/66   Pulse 78   Ht 5' 4 (1.626 m)   Wt 118 lb 12 oz (53.9 kg)   SpO2 98%   BMI 20.38 kg/m   Physical Exam Constitutional:      General: She is not in acute distress.    Appearance: Normal appearance.  HENT:     Head: Normocephalic and atraumatic.  Pulmonary:     Effort: Pulmonary effort is normal. No respiratory distress.  Musculoskeletal:     Cervical back: Normal range of motion.  Skin:    General: Skin is warm.     Coloration: Skin is not ashen or cyanotic.     Findings: No abscess or erythema.     Comments: Scattered areas of fine, white, diffuse scaliness without underlying erythema  Neurological:     General: No focal deficit present.     Mental Status: She is alert and oriented to person, place, and time. Mental status is at baseline.  Psychiatric:        Mood and Affect: Mood normal.        Thought Content: Thought content normal.        Judgment: Judgment normal.       Assessment & Plan:  Scalp pruritus -     Ketoconazole ; Apply to scalp 2-3 times a week for 8 weeks. May continue weekly thereafter if needed.  Dispense: 120 mL; Refill: 11 -     Fluocinolone  Acetonide Scalp; Apply topically to itchy area of  scalp twice daily.  Dispense: 20 mL; Refill: 2  Suspect seborrheic dermatitis as most likely cause of itching.  Start ketoconazole  shampoo 2-3 times weekly for 8 weeks.  May also use fluocinolone  oil topically up to twice daily on an as-needed basis.  Return if symptoms worsen or fail to improve.  Joesph DELENA Sear, PA

## 2023-08-06 ENCOUNTER — Other Ambulatory Visit: Payer: Self-pay | Admitting: Family Medicine

## 2023-08-06 DIAGNOSIS — E785 Hyperlipidemia, unspecified: Secondary | ICD-10-CM

## 2023-08-06 DIAGNOSIS — F39 Unspecified mood [affective] disorder: Secondary | ICD-10-CM

## 2023-09-04 ENCOUNTER — Encounter: Payer: Self-pay | Admitting: Family Medicine

## 2023-09-05 ENCOUNTER — Encounter: Payer: Self-pay | Admitting: Family Medicine

## 2023-09-05 ENCOUNTER — Ambulatory Visit (INDEPENDENT_AMBULATORY_CARE_PROVIDER_SITE_OTHER): Payer: Medicare Other | Admitting: Family Medicine

## 2023-09-05 VITALS — BP 145/76 | HR 67 | Ht 64.0 in | Wt 113.0 lb

## 2023-09-05 DIAGNOSIS — Z0000 Encounter for general adult medical examination without abnormal findings: Secondary | ICD-10-CM

## 2023-09-05 DIAGNOSIS — I10 Essential (primary) hypertension: Secondary | ICD-10-CM

## 2023-09-05 DIAGNOSIS — F411 Generalized anxiety disorder: Secondary | ICD-10-CM

## 2023-09-05 DIAGNOSIS — L299 Pruritus, unspecified: Secondary | ICD-10-CM | POA: Insufficient documentation

## 2023-09-05 NOTE — Assessment & Plan Note (Signed)
 Patient's goal is less than 150 systolic.  On repeat she was 145.  Continue Hyzaar.

## 2023-09-05 NOTE — Progress Notes (Signed)
 Annual physical  Subjective    Patient ID: Michele Tyler, female    DOB: 1946-03-31  Age: 78 y.o. MRN: [id number]  Chief Complaint  Patient presents with   Annual Exam   HPI Michele Tyler is a 78 y.o. old female here  for annual exam.   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  She used to work at [employer].  Patient is single, lives by herself with animals.  Does not use tobacco, has minimal alcohol  use and no recreational drug use.  Exercises regularly about 5 times a week she will walk 2 miles at a time.  Also does some some strength training.  Patient takes atorvastatin  and Hyzaar.  Also takes BuSpar  as needed for anxiety.  Patient denies any issues with mood her entire life.  Has been on an SNRI and SSRI in the past and is not currently on it because these were not effective.  Recently retried Paxil  but started gaining weight on i and so she stopped it.  Patient was complaining of itching scalp going on for the past 1 to 2 months.  She has tried over-the-counter head and shoulders, was prescribed ketoconazole  and fluocinolone .  None of these have helped.  Feels like they actually made her scalp more irritated.  Patient dyes her hair but has been using the same product for decades.  No new shampoos or detergents or soaps.    The 10-year ASCVD risk score (Arnett DK, et al., 2019) is: 29.2%  Health Maintenance Due  Topic Date Due   Hepatitis C Screening  Never done   Zoster Vaccines- Shingrix (1 of 2) Never done   Pneumonia Vaccine 101+ Years old (2 of 2 - PCV) 09/07/2014   COVID-19 Vaccine (6 - 2024-25 season) 03/30/2023      Objective:     BP (!) 145/76   Pulse 67   Ht 5' 4 (1.626 m)   Wt 113 lb (51.3 kg)   SpO2 99%   BMI 19.40 kg/m    Physical Exam General: Alert, oriented HEENT: No erythema excoriations bumps or other abnormality seen on the patient's scalp.  Some dandruff but otherwise normal  scalp CV: Regular rate rhythm no murmurs Pulmonary: Lungs clear bilaterally GI: Soft, normal bowel sounds MSK: Strength equal bilaterally Skin: Warm and dry Psych: Pleasant affect   No results found for any visits on 09/05/23.      Assessment & Plan:   Physical exam, annual  Generalized anxiety disorder Assessment & Plan: Discussed bupropion  with the patient which I do not know if she has tried yet.  Gave her information about it.  Advised her if she wants to try in the future let us  know.  Patient not interested in therapy at this time.   Hypertension, goal below 150/90 Assessment & Plan: Patient's goal is less than 150 systolic.  On repeat she was 145.  Continue Hyzaar.   Itchy scalp Assessment & Plan: Has tried to pyrithione zinc, ketoconazole , topical steroid.  None have been effective.  Scalp exam was completely normal except for mild seborrheic dermatitis.  Gave her a list of other active ingredients found in over-the-counter medications.  Recommended she take an over-the-counter second-generation antihistamine.  Uncertain what could be causing it outside of seborrheic dermatitis, or why it is not improving with treatments if it is seborrheic dermatitis.  Offered referral to dermatology if the above recommendations do not help.  Return in about 6 months (around 03/04/2024) for HTN, pruritus.    Toribio MARLA Slain, MD

## 2023-09-05 NOTE — Patient Instructions (Addendum)
 It was nice to see you today,  We addressed the following topics today: -I did not see anything concerning on your scalp or anything that could explain why it is itching.  There are some other active ingredients in over-the-counter shampoos that I do not think you have tried yet.  And shoulders contains pyrithione zinc.  Some other active ingredients include salicylic acid 5% or selenium sulfide 2.5%. - An oral second-generation antihistamine such as Allegra, Claritin or Zyrtec may be able to help with relieving the itching.  You can take these once daily. - If this is not helping I would recommend you see a dermatologist.  We can put the referral in if you let us  know this is what you would like. - The other antidepressant/anxiety medication I mentioned that I did not see you have tried before is called bupropion  or Wellbutrin .  There is information regarding this in your handout.  Have a great day,  Rolan Slain, MD

## 2023-09-05 NOTE — Assessment & Plan Note (Signed)
 Discussed bupropion with the patient which I do not know if she has tried yet.  Gave her information about it.  Advised her if she wants to try in the future let us  know.  Patient not interested in therapy at this time.

## 2023-09-05 NOTE — Assessment & Plan Note (Addendum)
 Has tried to pyrithione zinc, ketoconazole , topical steroid.  None have been effective.  Scalp exam was completely normal except for mild seborrheic dermatitis.  Gave her a list of other active ingredients found in over-the-counter medications.  Recommended she take an over-the-counter second-generation antihistamine.  Uncertain what could be causing it outside of seborrheic dermatitis, or why it is not improving with treatments if it is seborrheic dermatitis.  Offered referral to dermatology if the above recommendations do not help.

## 2023-10-19 ENCOUNTER — Other Ambulatory Visit: Payer: Self-pay | Admitting: Family Medicine

## 2023-10-19 DIAGNOSIS — I10 Essential (primary) hypertension: Secondary | ICD-10-CM

## 2023-11-27 ENCOUNTER — Encounter: Payer: Self-pay | Admitting: Family Medicine

## 2023-11-27 ENCOUNTER — Ambulatory Visit (INDEPENDENT_AMBULATORY_CARE_PROVIDER_SITE_OTHER): Admitting: Family Medicine

## 2023-11-27 VITALS — BP 113/65 | HR 73 | Ht 64.0 in | Wt 117.0 lb

## 2023-11-27 DIAGNOSIS — L299 Pruritus, unspecified: Secondary | ICD-10-CM | POA: Diagnosis not present

## 2023-11-27 DIAGNOSIS — R159 Full incontinence of feces: Secondary | ICD-10-CM | POA: Insufficient documentation

## 2023-11-27 DIAGNOSIS — F39 Unspecified mood [affective] disorder: Secondary | ICD-10-CM

## 2023-11-27 DIAGNOSIS — R32 Unspecified urinary incontinence: Secondary | ICD-10-CM | POA: Diagnosis not present

## 2023-11-27 MED ORDER — BUPROPION HCL ER (XL) 150 MG PO TB24: 150.0000 mg | ORAL_TABLET | Freq: Every day | ORAL | 1 refills | Status: DC

## 2023-11-27 NOTE — Assessment & Plan Note (Signed)
 Mild issues with leakage of feces and urine . Recommended pelvic floor exercises.  Recommended trialing off lactose or other offending foods.  No red flag symptoms.

## 2023-11-27 NOTE — Assessment & Plan Note (Signed)
 Will start bupropion  150mg  xl. F/u one month

## 2023-11-27 NOTE — Progress Notes (Signed)
   Established Patient Office Visit  Subjective   Patient ID: Michele Tyler, female    DOB: 01/17/1946  Age: 78 y.o. MRN: [id number]  No chief complaint on file.   HPI  Subjective: - Upset stomach after eating for a few weeks - Scalp itchiness migrated to shoulders, accompanied by sneezing - Previously tried selenium sulfide shampoo and two prescription treatments (shampoo and topical) in February with no improvement; prescription treatments caused intense stinging and itching - Taking loratadine (generic Claritin) with some relief for sneezing - Tried generic Allegra previously  - Interested in trying Wellbutrin  (has tried it "way back") for mood - Not interested in therapy  - Reports urinary and fecal incontinence when walking (leakage) - Loose bowel movements  - negative for saddle anesthesia or LE weakness.     The 10-year ASCVD risk score (Arnett DK, et al., 2019) is: 18.8%  Health Maintenance Due  Topic Date Due   Hepatitis C Screening  Never done   Zoster Vaccines- Shingrix (1 of 2) Never done   Pneumonia Vaccine 61+ Years old (2 of 2 - PCV) 09/07/2014   COVID-19 Vaccine (6 - 2024-25 season) 03/30/2023   Medicare Annual Wellness (AWV)  01/09/2024      Objective:     BP 113/65   Pulse 73   Ht 5\' 4"  (1.626 m)   Wt 117 lb 0.6 oz (53.1 kg)   SpO2 97%   BMI 20.09 kg/m    Physical Exam Gen: alert, oriented Skin: no rash on face or neck. Cyst on right side of crown of head.  Mild diffuse alopecia.     No results found for any visits on 11/27/23.      Assessment & Plan:   Mood disorder [facility]) Assessment & Plan: Will start bupropion  150mg  xl. F/u one month  Orders: -     Ambulatory referral to Dermatology  Itchy scalp Assessment & Plan: Still no visible rash or lesions.  Will try taking the 2nd gen antihistamine bid.  Otherwise all topical treatments including multiple shampoos and topical steroid solution have failed to relieve symptoms.  Will  send in referral to dermatology   Urinary and fecal incontinence Assessment & Plan: Mild issues with leakage of feces and urine . Recommended pelvic floor exercises.  Recommended trialing off lactose or other offending foods.  No red flag symptoms.    Other orders -     buPROPion  HCl ER (XL); Take 1 tablet (150 mg total) by mouth daily.  Dispense: 30 tablet; Refill: 1     Return in about 5 weeks (around 01/01/2024) for Mood, physical.    Laneta Pintos, MD

## 2023-11-27 NOTE — Patient Instructions (Signed)
 It was nice to see you today,  We addressed the following topics today: -I am sending in bupropion .  You can take this at night and it may help with sleep.  If it does not you can always switch to taking it during the day - Before you start your bupropion /Wellbutrin , try taking your antihistamine over-the-counter twice a day to see if that helps with itching. - I have sent in a referral to the dermatologist.  Someone should reach out to you in the next month to schedule this. - Pelvic floor physical therapy in [place] can help with your incontinence issues.  Whenever you would like a referral to them let me know and I will send it in.  Have a great day,  Etha Henle, MD

## 2023-11-27 NOTE — Assessment & Plan Note (Signed)
 Still no visible rash or lesions.  Will try taking the 2nd gen antihistamine bid.  Otherwise all topical treatments including multiple shampoos and topical steroid solution have failed to relieve symptoms.  Will send in referral to dermatology

## 2023-12-17 ENCOUNTER — Other Ambulatory Visit: Payer: Self-pay | Admitting: Family Medicine

## 2023-12-24 ENCOUNTER — Other Ambulatory Visit: Payer: Self-pay | Admitting: Family Medicine

## 2023-12-24 ENCOUNTER — Other Ambulatory Visit: Payer: Self-pay | Admitting: *Deleted

## 2023-12-24 ENCOUNTER — Other Ambulatory Visit

## 2023-12-24 DIAGNOSIS — F39 Unspecified mood [affective] disorder: Secondary | ICD-10-CM

## 2023-12-24 DIAGNOSIS — E785 Hyperlipidemia, unspecified: Secondary | ICD-10-CM

## 2023-12-24 DIAGNOSIS — I10 Essential (primary) hypertension: Secondary | ICD-10-CM

## 2023-12-24 DIAGNOSIS — Z131 Encounter for screening for diabetes mellitus: Secondary | ICD-10-CM | POA: Diagnosis not present

## 2023-12-25 ENCOUNTER — Ambulatory Visit: Payer: Self-pay | Admitting: Family Medicine

## 2023-12-25 LAB — CBC WITH DIFFERENTIAL/PLATELET
Basophils Absolute: 0 10*3/uL (ref 0.0–0.2)
Basos: 1 %
EOS (ABSOLUTE): 0.2 10*3/uL (ref 0.0–0.4)
Eos: 3 %
Hematocrit: 39.7 % (ref 34.0–46.6)
Hemoglobin: 12.7 g/dL (ref 11.1–15.9)
Immature Grans (Abs): 0 10*3/uL (ref 0.0–0.1)
Immature Granulocytes: 0 %
Lymphocytes Absolute: 0.6 10*3/uL — ABNORMAL LOW (ref 0.7–3.1)
Lymphs: 10 %
MCH: 28.8 pg (ref 26.6–33.0)
MCHC: 32 g/dL (ref 31.5–35.7)
MCV: 90 fL (ref 79–97)
Monocytes Absolute: 0.6 10*3/uL (ref 0.1–0.9)
Monocytes: 11 %
Neutrophils Absolute: 4.6 10*3/uL (ref 1.4–7.0)
Neutrophils: 75 %
Platelets: 434 10*3/uL (ref 150–450)
RBC: 4.41 x10E6/uL (ref 3.77–5.28)
RDW: 13.4 % (ref 11.7–15.4)
WBC: 6.1 10*3/uL (ref 3.4–10.8)

## 2023-12-25 LAB — COMPREHENSIVE METABOLIC PANEL WITH GFR
ALT: 24 IU/L (ref 0–32)
AST: 44 IU/L — ABNORMAL HIGH (ref 0–40)
Albumin: 4.6 g/dL (ref 3.8–4.8)
Alkaline Phosphatase: 99 IU/L (ref 44–121)
BUN/Creatinine Ratio: 15 (ref 12–28)
BUN: 9 mg/dL (ref 8–27)
Bilirubin Total: 0.4 mg/dL (ref 0.0–1.2)
CO2: 21 mmol/L (ref 20–29)
Calcium: 9.6 mg/dL (ref 8.7–10.3)
Chloride: 89 mmol/L — ABNORMAL LOW (ref 96–106)
Creatinine, Ser: 0.62 mg/dL (ref 0.57–1.00)
Globulin, Total: 2.4 g/dL (ref 1.5–4.5)
Glucose: 82 mg/dL (ref 70–99)
Potassium: 4.5 mmol/L (ref 3.5–5.2)
Sodium: 127 mmol/L — ABNORMAL LOW (ref 134–144)
Total Protein: 7 g/dL (ref 6.0–8.5)
eGFR: 92 mL/min/{1.73_m2} (ref >59)

## 2023-12-25 LAB — LIPID PANEL
Chol/HDL Ratio: 2.1 ratio (ref 0.0–4.4)
Cholesterol, Total: 160 mg/dL (ref 100–199)
HDL: 75 mg/dL (ref >39)
LDL Chol Calc (NIH): 71 mg/dL (ref 0–99)
Triglycerides: 74 mg/dL (ref 0–149)
VLDL Cholesterol Cal: 14 mg/dL (ref 5–40)

## 2023-12-25 LAB — HEMOGLOBIN A1C
Est. average glucose Bld gHb Est-mCnc: 111 mg/dL
Hgb A1c MFr Bld: 5.5 % (ref 4.8–5.6)

## 2023-12-25 LAB — TSH: TSH: 0.821 u[IU]/mL (ref 0.450–4.500)

## 2024-01-01 ENCOUNTER — Encounter: Payer: Self-pay | Admitting: Family Medicine

## 2024-01-01 ENCOUNTER — Ambulatory Visit (INDEPENDENT_AMBULATORY_CARE_PROVIDER_SITE_OTHER): Admitting: Family Medicine

## 2024-01-01 VITALS — BP 138/78 | HR 74 | Ht 64.0 in | Wt 116.1 lb

## 2024-01-01 DIAGNOSIS — E871 Hypo-osmolality and hyponatremia: Secondary | ICD-10-CM

## 2024-01-01 DIAGNOSIS — Z0000 Encounter for general adult medical examination without abnormal findings: Secondary | ICD-10-CM | POA: Diagnosis not present

## 2024-01-01 DIAGNOSIS — L299 Pruritus, unspecified: Secondary | ICD-10-CM | POA: Diagnosis not present

## 2024-01-01 DIAGNOSIS — F39 Unspecified mood [affective] disorder: Secondary | ICD-10-CM | POA: Diagnosis not present

## 2024-01-01 NOTE — Progress Notes (Signed)
   Established Patient Office Visit  Subjective   Patient ID: Michele Tyler, female    DOB: April 08, 1946  Age: 78 y.o. MRN: [id number]  Chief Complaint  Patient presents with   Annual Exam    HPI  Subjective - Annual physical and follow-up for bupropion  - Currently taking bupropion , titrating slowly, up to half pill daily - Tolerating medication well, no significant changes noted - Scalp itching improved, not using prescribed shampoos - Using Cortisone 10 occasionally on neck and shoulders - Dermatology referral denied as provider not accepting new patients - Reports blurry vision  Medications: bupropion  (titrating up), Lipitor, Hyzaar (blood pressure), Buspar .  PMH, PSH, FH, Social Hx: Minimal alcohol consumption (small amount with tonic water).  ROS: Denies significant side effects from bupropion . Reports improved scalp itching. Reports blurry vision.    The 10-year ASCVD risk score (Arnett DK, et al., 2019) is: 26.7%  Health Maintenance Due  Topic Date Due   Hepatitis C Screening  Never done   Zoster Vaccines- Shingrix (1 of 2) Never done   Pneumonia Vaccine 4+ Years old (2 of 2 - PCV) 09/07/2014   COVID-19 Vaccine (6 - 2024-25 season) 03/30/2023   Medicare Annual Wellness (AWV)  01/09/2024      Objective:     BP 138/78   Pulse 74   Ht 5\' 4"  (1.626 m)   Wt 116 lb 1.9 oz (52.7 kg)   BMI 19.93 kg/m    Physical Exam Gen; alert, oriented Heent: perrla, eomi Cv: rrr Pulm: lctab Msk: strength equal b/l. Normal gait Psych: pleasant affect   No results found for any visits on 01/01/24.      Assessment & Plan:   Physical exam, annual  Mood disorder (HCC) Assessment & Plan:    - Currently titrating bupropion , up to half pill daily    - Tolerating well without significant side effects    - Continue slow titration as tolerated    - Lab visit in 1 month to assess medication progress    - Virtual or in-person visit in 3 months    Itchy  scalp Assessment & Plan:    - Symptoms improving    - Previously tried cortisone cream for neck/shoulders    - Dermatology referral denied (not accepting new patients)    - Hold on further referrals as symptoms improving    Hyponatremia Assessment & Plan: Present for past 5 years.  Slightly worse (127) today than previous values.  Asymptomatic.  Recheck in 1 month.        Return in about 3 months (around 04/02/2024) for mood.    Laneta Pintos, MD

## 2024-01-01 NOTE — Patient Instructions (Signed)
 It was nice to see you today,  We addressed the following topics today: -I will repeat your labs in 1 month and then we will see back in 3 months - Continue to titrate up the bupropion  as needed.   Have a great day,  Etha Henle, MD

## 2024-01-06 DIAGNOSIS — E871 Hypo-osmolality and hyponatremia: Secondary | ICD-10-CM | POA: Insufficient documentation

## 2024-01-06 NOTE — Assessment & Plan Note (Signed)
-   Symptoms improving    - Previously tried cortisone cream for neck/shoulders    - Dermatology referral denied (not accepting new patients)    - Hold on further referrals as symptoms improving

## 2024-01-06 NOTE — Assessment & Plan Note (Signed)
-   Currently titrating bupropion , up to half pill daily    - Tolerating well without significant side effects    - Continue slow titration as tolerated    - Lab visit in 1 month to assess medication progress    - Virtual or in-person visit in 3 months

## 2024-01-06 NOTE — Assessment & Plan Note (Signed)
 Present for past 5 years.  Slightly worse (127) today than previous values.  Asymptomatic.  Recheck in 1 month.

## 2024-01-28 ENCOUNTER — Other Ambulatory Visit: Payer: Self-pay | Admitting: *Deleted

## 2024-01-28 DIAGNOSIS — E871 Hypo-osmolality and hyponatremia: Secondary | ICD-10-CM

## 2024-02-02 ENCOUNTER — Other Ambulatory Visit

## 2024-02-02 DIAGNOSIS — E871 Hypo-osmolality and hyponatremia: Secondary | ICD-10-CM | POA: Diagnosis not present

## 2024-02-03 LAB — COMPREHENSIVE METABOLIC PANEL WITH GFR
ALT: 17 IU/L (ref 0–32)
AST: 31 IU/L (ref 0–40)
Albumin: 4.4 g/dL (ref 3.8–4.8)
Alkaline Phosphatase: 86 IU/L (ref 44–121)
BUN/Creatinine Ratio: 12 (ref 12–28)
BUN: 8 mg/dL (ref 8–27)
Bilirubin Total: 0.3 mg/dL (ref 0.0–1.2)
CO2: 22 mmol/L (ref 20–29)
Calcium: 9.3 mg/dL (ref 8.7–10.3)
Chloride: 89 mmol/L — ABNORMAL LOW (ref 96–106)
Creatinine, Ser: 0.69 mg/dL (ref 0.57–1.00)
Globulin, Total: 2.2 g/dL (ref 1.5–4.5)
Glucose: 56 mg/dL — ABNORMAL LOW (ref 70–99)
Potassium: 4.6 mmol/L (ref 3.5–5.2)
Sodium: 127 mmol/L — ABNORMAL LOW (ref 134–144)
Total Protein: 6.6 g/dL (ref 6.0–8.5)
eGFR: 90 mL/min/1.73 (ref >59)

## 2024-02-06 ENCOUNTER — Encounter: Payer: Self-pay | Admitting: Family Medicine

## 2024-02-06 ENCOUNTER — Other Ambulatory Visit: Payer: Self-pay

## 2024-02-06 ENCOUNTER — Ambulatory Visit: Payer: Self-pay | Admitting: Family Medicine

## 2024-02-06 DIAGNOSIS — E871 Hypo-osmolality and hyponatremia: Secondary | ICD-10-CM

## 2024-02-06 MED ORDER — OLMESARTAN MEDOXOMIL 20 MG PO TABS: 20.0000 mg | ORAL_TABLET | Freq: Every day | ORAL | 2 refills | Status: DC

## 2024-02-06 NOTE — Telephone Encounter (Signed)
 Pt wants her Rx for olmesartan  (BENICAR ) 20 MG tablet to be sent to the mail order.  Please resend. Pt is coming in for labs on 03/17/24

## 2024-02-20 DIAGNOSIS — H2513 Age-related nuclear cataract, bilateral: Secondary | ICD-10-CM | POA: Diagnosis not present

## 2024-02-20 DIAGNOSIS — H524 Presbyopia: Secondary | ICD-10-CM | POA: Diagnosis not present

## 2024-03-10 ENCOUNTER — Telehealth: Payer: Self-pay | Admitting: *Deleted

## 2024-03-10 NOTE — Telephone Encounter (Signed)
 Copied from CRM [id number]. Topic: Clinical - Lab/Test Results >> Mar 10, 2024  9:14 AM Emylou G wrote: Reason for CRM: Patient wants to know if she needs to fast for 8/20 labs? Is it covered by Medicare?  Last time she was billied $350?

## 2024-03-10 NOTE — Telephone Encounter (Signed)
 LVM for pt to let her know that it does not look like she will need to fast for her labs.  Provider is repeating a BMP since they made some medication adjustments, as far as the insurance covering it she would need to contact the insurance company to see if they would cover it.

## 2024-03-17 ENCOUNTER — Other Ambulatory Visit

## 2024-03-17 DIAGNOSIS — E871 Hypo-osmolality and hyponatremia: Secondary | ICD-10-CM | POA: Diagnosis not present

## 2024-03-18 ENCOUNTER — Ambulatory Visit: Payer: Self-pay | Admitting: Family Medicine

## 2024-03-18 LAB — BASIC METABOLIC PANEL WITH GFR
BUN/Creatinine Ratio: 12 (ref 12–28)
BUN: 8 mg/dL (ref 8–27)
CO2: 22 mmol/L (ref 20–29)
Calcium: 9.1 mg/dL (ref 8.7–10.3)
Chloride: 95 mmol/L — ABNORMAL LOW (ref 96–106)
Creatinine, Ser: 0.69 mg/dL (ref 0.57–1.00)
Glucose: 74 mg/dL (ref 70–99)
Potassium: 4.7 mmol/L (ref 3.5–5.2)
Sodium: 133 mmol/L — ABNORMAL LOW (ref 134–144)
eGFR: 90 mL/min/1.73 (ref >59)

## 2024-03-19 ENCOUNTER — Telehealth: Payer: Self-pay

## 2024-03-19 NOTE — Telephone Encounter (Signed)
 Pt informed of below.

## 2024-03-19 NOTE — Telephone Encounter (Signed)
 Copied from CRM [id number]. Topic: Clinical - Lab/Test Results >> Mar 19, 2024 11:39 AM Michele Tyler wrote: Reason for CRM: The patient got her lab results recently but she just wants to make sure it is coded properly through her insurance. Her blood work from 01/28/2024 was coded wrong and she just got it corrected and wants to make sure this is not an issue again. Please assist patient further.

## 2024-03-19 NOTE — Telephone Encounter (Signed)
 Her only lab this time was a BMP for hyponatremia so I would hope her insurance doesn't deny it but it wouldn't surprise me.

## 2024-04-08 ENCOUNTER — Encounter: Payer: Self-pay | Admitting: Family Medicine

## 2024-04-08 ENCOUNTER — Ambulatory Visit (INDEPENDENT_AMBULATORY_CARE_PROVIDER_SITE_OTHER): Admitting: Family Medicine

## 2024-04-08 VITALS — BP 150/72 | HR 75 | Ht 64.0 in | Wt 115.2 lb

## 2024-04-08 DIAGNOSIS — I10 Essential (primary) hypertension: Secondary | ICD-10-CM | POA: Diagnosis not present

## 2024-04-08 DIAGNOSIS — F39 Unspecified mood [affective] disorder: Secondary | ICD-10-CM

## 2024-04-08 DIAGNOSIS — R110 Nausea: Secondary | ICD-10-CM

## 2024-04-08 NOTE — Patient Instructions (Addendum)
 It was nice to see you today,  We addressed the following topics today: -I would like you to increase your BuSpar  from 7.5 to 15mg  by taking 2 tablets instead of 1 tablet.  You can do this twice a day, but it would be safe to do this 3 times a day as well. - At the same time you can decrease your bupropion  by taking the half a tablet every other day for 2 weeks and then stopping - For your stomach complaints I would like you to first try eliminating all dairy products including milk cheese butter yogurt ice cream for at least 2 weeks.  If that does not work then try limiting all gluten from your diet for 2 weeks.  Make sure you are strict as possible with this. - If none of this works I want you to try taking over-the-counter 20 mg Pepcid once a day.  Do that for at least 2 weeks or until I follow-up with you again 2 months.  Have a great day,  Rolan Slain, MD

## 2024-04-08 NOTE — Progress Notes (Signed)
   Established Patient Office Visit  Subjective   Patient ID: Michele Tyler, female    DOB: [birth year]  Age: 78 y.o. MRN: [id number]  Chief Complaint  Patient presents with   Medical Management of Chronic Issues    HPI  Subjective - Reports intermittent, post-prandial queasiness and nausea, occurring approximately 50% of the time after eating. Sometimes associated with diarrhea. No vomiting. Denies it is debilitating. An elimination trial of soy milk was unhelpful. - Reports persistent low mood. Trial of bupropion  (half tablet) resulted in increased emotional lability and tearfulness. - Follow-up for hypertension.  Medications Olmesartan  half tablet daily, bupropion  half tablet daily, buspirone  7.5mg  PRN up to two times daily. Reports occasional use of Pepto-Bismol and Tums for stomach symptoms.  PMH, PSH, FH, Social Hx PMHx: Hypertension, depression, anxiety. Failed antidepressant trials include paroxetine , Viibryd , Trintellix, and fluoxetine. No history of cholecystectomy or other abdominal surgery.  ROS GI: Reports post-prandial nausea and occasional diarrhea. No vomiting. Psych: Reports depressed mood and increased emotionality on bupropion . Reports anxiety is helped by buspirone .  The 10-year ASCVD risk score (Arnett DK, et al., 2019) is: 30.7%  Health Maintenance Due  Topic Date Due   Hepatitis C Screening  Never done   Zoster Vaccines- Shingrix (1 of 2) Never done   Pneumococcal Vaccine: 50+ Years (2 of 2 - PCV) 09/07/2014   Medicare Annual Wellness (AWV)  01/09/2024   Influenza Vaccine  02/27/2024   COVID-19 Vaccine (6 - 2025-26 season) 03/29/2024      Objective:     BP (!) 150/72   Pulse 75   Ht 5' 4 (1.626 m)   Wt 115 lb 3.2 oz (52.3 kg)   SpO2 98%   BMI 19.77 kg/m    Physical Exam Gen: alert, oriented Pulm: no resp distress Psych: pleasant affect   No results found for any visits on 04/08/24.      Assessment & Plan:   Mood disorder  [facility]) Assessment & Plan: History of multiple failed antidepressant trials (SSRIs/SNRIs). Current trial of bupropion  has resulted in increased tearfulness. Buspirone  is reported as helpful for anxiety. Plan is to increase buspirone  and taper off bupropion    - Taper off bupropion  by taking half a tablet every other day for two weeks, then stop. - Increase buspirone  to 15mg  twice daily. Can use existing 7.5mg  tablets. If effective, a new prescription for 15mg  tablets will be sent. - Discussed other options including dopaminergic agents (Seroquel, Abilify, Latuda) if this change is not effective.   Hypertension, goal below 150/90 Assessment & Plan: Taking half a tablet of olmesartan  with home BPs not being low. - Continue olmesartan . If BP remains elevated, increase to one full tablet daily.   Nausea Assessment & Plan: Reports post-prandial nausea and occasional diarrhea. - Counseled on dietary elimination trials. Advised to first strictly eliminate all dairy products for at least one week. - If no improvement, resume dairy and trial a strict gluten-free diet for one to two weeks, paying attention to food labels. - If dietary changes are ineffective, advised to trial OTC Pepcid once daily for at least two weeks.      Return in about 2 months (around 06/08/2024) for mood, stomach.    Toribio MARLA Slain, MD

## 2024-04-12 DIAGNOSIS — R110 Nausea: Secondary | ICD-10-CM | POA: Insufficient documentation

## 2024-04-12 NOTE — Assessment & Plan Note (Signed)
 Reports post-prandial nausea and occasional diarrhea. - Counseled on dietary elimination trials. Advised to first strictly eliminate all dairy products for at least one week. - If no improvement, resume dairy and trial a strict gluten-free diet for one to two weeks, paying attention to food labels. - If dietary changes are ineffective, advised to trial OTC Pepcid once daily for at least two weeks.

## 2024-04-12 NOTE — Assessment & Plan Note (Signed)
 History of multiple failed antidepressant trials (SSRIs/SNRIs). Current trial of bupropion  has resulted in increased tearfulness. Buspirone  is reported as helpful for anxiety. Plan is to increase buspirone  and taper off bupropion    - Taper off bupropion  by taking half a tablet every other day for two weeks, then stop. - Increase buspirone  to 15mg  twice daily. Can use existing 7.5mg  tablets. If effective, a new prescription for 15mg  tablets will be sent. - Discussed other options including dopaminergic agents (Seroquel, Abilify, Latuda) if this change is not effective.

## 2024-04-12 NOTE — Assessment & Plan Note (Signed)
 Taking half a tablet of olmesartan  with home BPs not being low. - Continue olmesartan . If BP remains elevated, increase to one full tablet daily.

## 2024-04-20 ENCOUNTER — Other Ambulatory Visit: Payer: Self-pay | Admitting: Family Medicine

## 2024-04-28 ENCOUNTER — Other Ambulatory Visit: Payer: Self-pay | Admitting: Family Medicine

## 2024-04-28 DIAGNOSIS — Z1211 Encounter for screening for malignant neoplasm of colon: Secondary | ICD-10-CM

## 2024-05-10 ENCOUNTER — Ambulatory Visit (INDEPENDENT_AMBULATORY_CARE_PROVIDER_SITE_OTHER): Admitting: Family Medicine

## 2024-05-10 VITALS — BP 163/81 | HR 98 | Ht 64.0 in | Wt 112.5 lb

## 2024-05-10 DIAGNOSIS — R42 Dizziness and giddiness: Secondary | ICD-10-CM

## 2024-05-10 DIAGNOSIS — E871 Hypo-osmolality and hyponatremia: Secondary | ICD-10-CM

## 2024-05-10 DIAGNOSIS — I10 Essential (primary) hypertension: Secondary | ICD-10-CM | POA: Diagnosis not present

## 2024-05-10 NOTE — Patient Instructions (Signed)
 It was nice to see you today,  We addressed the following topics today: - check your blood pressure twice a day for the next two weeks, morning and evening.   - bring those results back to us .     Have a great day,  Rolan Slain, MD

## 2024-05-10 NOTE — Progress Notes (Unsigned)
   Acute Office Visit  Subjective:     Patient ID: Michele Tyler, female    DOB: July 18, 1946, 78 y.o.   MRN: [id number]  Chief Complaint  Patient presents with   Blood Pressure Check    Started new BP medication  Dizziness started Friday night    HPI Patient is in today for   Subjective - Reports new onset dizziness starting Friday evening (05/07/2024). Describes it as feeling weak and lightheaded, not vertigo. Denies syncope or falls associated with the dizziness. This is a new symptom. - Reports a fall out of bed on Tuesday night (05/04/2024), hitting head. Denies headache, dizziness, or other symptoms immediately after the fall. Felt fine the next morning. - Reports a feeling of a hard knot in the abdomen. Denies constipation.  Medications Medication history includes losartan /HCTZ, which was discontinued due to hyponatremia. Switched to olmesartan , initially half a tablet and increased to a full tablet approximately two months ago.  PMH, PSH, FH, Social Hx PMHx: Hypertension, hyponatremia (resolved after discontinuing HCTZ). Social Hx: Reports inconsistent home blood pressure monitoring.  ROS Constitutional: Reports weakness/lightheadedness. Denies syncope. HEENT: Reports hitting head. Denies headache, vision changes such as double vision. Cardiovascular: Denies chest pain. GI: Reports abdominal hardness. Denies constipation, nausea.  Objective Vitals: BP 160/80, 161/81, and 163/81. Pulse normal. NEURO: Cranial nerves II-XII intact: facial movements symmetrical (eyebrow raise, eye squeeze, smile, puffed cheeks), no facial droop. Strength 5/5 and symmetric in upper extremities with push/pull. No pronator drift. Finger-to-nose testing intact bilaterally. GI: Abdomen not examined today.  Assessment and Plan Hypertension, uncontrolled. Presents with elevated blood pressure of 163/81. History of medication change from losartan /HCTZ to olmesartan  due to hyponatremia.  Currently on a full tablet of olmesartan , dose increased two months ago. Denies symptoms of hypertensive emergency such as chest pain or nausea. - Patient provided with a BP log sheet. - Instructed to monitor and record BP twice daily (morning and evening) for at least two weeks and bring the log to the clinic. - Plan to consider adding amlodipine if home BPs remain elevated after two weeks. - Labs ordered to check sodium level to rule out recurrence of hyponatremia. - Follow up in one month as previously scheduled on 06/09/2024.  Dizziness. New onset lightheadedness and weakness started Friday evening. Neurological exam is non-focal. Denies vertigo, syncope, or associated falls. Etiology is unclear but may be related to medications or hyponatremia. - Labs ordered to check sodium level. - Patient counseled to go to the emergency department for evaluation if they experience syncope, a fall, severe dizziness, or new neurological symptoms like double vision. Advised not to drive if experiencing fainting episodes.  Abdominal hardness. Reports a hard knot in the abdomen. Denies constipation. - Plan to perform abdominal exam at the next scheduled visit on 06/09/2024.  ROS      Objective:    BP (!) 160/81   Pulse 98   Ht 5' 4 (1.626 m)   Wt 112 lb 8 oz (51 kg)   SpO2 98%   BMI 19.31 kg/m  {Vitals History (Optional):23777}  Physical Exam  No results found for any visits on 05/10/24.      Assessment & Plan:   There are no diagnoses linked to this encounter.   No follow-ups on file.  Toribio MARLA Slain, MD

## 2024-05-11 DIAGNOSIS — R42 Dizziness and giddiness: Secondary | ICD-10-CM | POA: Insufficient documentation

## 2024-05-11 LAB — CBC WITH DIFFERENTIAL/PLATELET
Basophils Absolute: 0 x10E3/uL (ref 0.0–0.2)
Basos: 0 %
EOS (ABSOLUTE): 0 x10E3/uL (ref 0.0–0.4)
Eos: 0 %
Hematocrit: 38.2 % (ref 34.0–46.6)
Hemoglobin: 12.5 g/dL (ref 11.1–15.9)
Immature Grans (Abs): 0 x10E3/uL (ref 0.0–0.1)
Immature Granulocytes: 0 %
Lymphocytes Absolute: 0.4 x10E3/uL — ABNORMAL LOW (ref 0.7–3.1)
Lymphs: 4 %
MCH: 29.1 pg (ref 26.6–33.0)
MCHC: 32.7 g/dL (ref 31.5–35.7)
MCV: 89 fL (ref 79–97)
Monocytes Absolute: 0.9 x10E3/uL (ref 0.1–0.9)
Monocytes: 10 %
Neutrophils Absolute: 7.6 x10E3/uL — ABNORMAL HIGH (ref 1.4–7.0)
Neutrophils: 86 %
Platelets: 497 x10E3/uL — ABNORMAL HIGH (ref 150–450)
RBC: 4.29 x10E6/uL (ref 3.77–5.28)
RDW: 13.1 % (ref 11.7–15.4)
WBC: 9 x10E3/uL (ref 3.4–10.8)

## 2024-05-11 LAB — COMPREHENSIVE METABOLIC PANEL WITH GFR
ALT: 19 IU/L (ref 0–32)
AST: 36 IU/L (ref 0–40)
Albumin: 4.3 g/dL (ref 3.8–4.8)
Alkaline Phosphatase: 98 IU/L (ref 49–135)
BUN/Creatinine Ratio: 17 (ref 12–28)
BUN: 12 mg/dL (ref 8–27)
Bilirubin Total: 0.3 mg/dL (ref 0.0–1.2)
CO2: 26 mmol/L (ref 20–29)
Calcium: 9.8 mg/dL (ref 8.7–10.3)
Chloride: 91 mmol/L — ABNORMAL LOW (ref 96–106)
Creatinine, Ser: 0.69 mg/dL (ref 0.57–1.00)
Globulin, Total: 2.2 g/dL (ref 1.5–4.5)
Glucose: 91 mg/dL (ref 70–99)
Potassium: 4.6 mmol/L (ref 3.5–5.2)
Sodium: 131 mmol/L — ABNORMAL LOW (ref 134–144)
Total Protein: 6.5 g/dL (ref 6.0–8.5)
eGFR: 90 mL/min/1.73 (ref >59)

## 2024-05-11 LAB — TSH: TSH: 0.957 u[IU]/mL (ref 0.450–4.500)

## 2024-05-11 NOTE — Assessment & Plan Note (Signed)
 New onset lightheadedness and weakness started Friday evening. Neurological exam is non-focal. Denies vertigo, syncope, or associated falls. Etiology is unclear but may be related to medications or hyponatremia. - Labs ordered to check sodium level. - Patient counseled to go to the emergency department for evaluation if they experience syncope, a fall, severe dizziness, or new neurological symptoms like double vision. Advised not to drive if experiencing fainting episodes.

## 2024-05-11 NOTE — Assessment & Plan Note (Signed)
 Presents with elevated blood pressure of 163/81. History of medication change from losartan /HCTZ to olmesartan  due to hyponatremia. Currently on a full tablet of olmesartan , dose increased two months ago. Denies symptoms of hypertensive emergency such as chest pain or nausea. - Patient provided with a BP log sheet. - Instructed to monitor and record BP twice daily (morning and evening) for at least two weeks and bring the log to the clinic. - Plan to consider adding amlodipine if home BPs remain elevated after two weeks. - Labs ordered to check sodium level to rule out recurrence of hyponatremia. - Follow up in one month as previously scheduled on 06/09/2024.

## 2024-05-12 ENCOUNTER — Ambulatory Visit

## 2024-05-12 ENCOUNTER — Other Ambulatory Visit: Payer: Self-pay

## 2024-05-12 ENCOUNTER — Encounter (HOSPITAL_COMMUNITY): Payer: Self-pay

## 2024-05-12 ENCOUNTER — Emergency Department (HOSPITAL_COMMUNITY)

## 2024-05-12 ENCOUNTER — Inpatient Hospital Stay (HOSPITAL_COMMUNITY)
Admission: EM | Admit: 2024-05-12 | Discharge: 2024-05-20 | DRG: 065 | Disposition: A | Discharge status: Skilled Nursing Facility | Attending: Family Medicine | Admitting: Family Medicine

## 2024-05-12 ENCOUNTER — Encounter: Payer: Self-pay | Admitting: Emergency Medicine

## 2024-05-12 ENCOUNTER — Ambulatory Visit: Admission: EM | Admit: 2024-05-12 | Discharge: 2024-05-12 | Disposition: A | Discharge status: Home / Self Care

## 2024-05-12 ENCOUNTER — Ambulatory Visit: Payer: Self-pay | Admitting: Family Medicine

## 2024-05-12 DIAGNOSIS — Z8673 Personal history of transient ischemic attack (TIA), and cerebral infarction without residual deficits: Secondary | ICD-10-CM

## 2024-05-12 DIAGNOSIS — E785 Hyperlipidemia, unspecified: Secondary | ICD-10-CM | POA: Diagnosis present

## 2024-05-12 DIAGNOSIS — Z87891 Personal history of nicotine dependence: Secondary | ICD-10-CM

## 2024-05-12 DIAGNOSIS — I63441 Cerebral infarction due to embolism of right cerebellar artery: Secondary | ICD-10-CM | POA: Diagnosis present

## 2024-05-12 DIAGNOSIS — Z818 Family history of other mental and behavioral disorders: Secondary | ICD-10-CM | POA: Diagnosis not present

## 2024-05-12 DIAGNOSIS — I6340 Cerebral infarction due to embolism of unspecified cerebral artery: Secondary | ICD-10-CM | POA: Diagnosis not present

## 2024-05-12 DIAGNOSIS — J90 Pleural effusion, not elsewhere classified: Secondary | ICD-10-CM | POA: Diagnosis present

## 2024-05-12 DIAGNOSIS — D509 Iron deficiency anemia, unspecified: Secondary | ICD-10-CM | POA: Diagnosis present

## 2024-05-12 DIAGNOSIS — I10 Essential (primary) hypertension: Secondary | ICD-10-CM | POA: Diagnosis present

## 2024-05-12 DIAGNOSIS — R4189 Other symptoms and signs involving cognitive functions and awareness: Secondary | ICD-10-CM | POA: Diagnosis present

## 2024-05-12 DIAGNOSIS — E871 Hypo-osmolality and hyponatremia: Secondary | ICD-10-CM | POA: Diagnosis present

## 2024-05-12 DIAGNOSIS — R42 Dizziness and giddiness: Secondary | ICD-10-CM | POA: Diagnosis not present

## 2024-05-12 DIAGNOSIS — R011 Cardiac murmur, unspecified: Secondary | ICD-10-CM | POA: Diagnosis present

## 2024-05-12 DIAGNOSIS — Z8249 Family history of ischemic heart disease and other diseases of the circulatory system: Secondary | ICD-10-CM | POA: Diagnosis not present

## 2024-05-12 DIAGNOSIS — R270 Ataxia, unspecified: Secondary | ICD-10-CM | POA: Diagnosis present

## 2024-05-12 DIAGNOSIS — F411 Generalized anxiety disorder: Secondary | ICD-10-CM | POA: Diagnosis present

## 2024-05-12 DIAGNOSIS — Z885 Allergy status to narcotic agent status: Secondary | ICD-10-CM

## 2024-05-12 DIAGNOSIS — R29700 NIHSS score 0: Secondary | ICD-10-CM | POA: Diagnosis present

## 2024-05-12 DIAGNOSIS — D75838 Other thrombocytosis: Secondary | ICD-10-CM | POA: Diagnosis present

## 2024-05-12 DIAGNOSIS — R233 Spontaneous ecchymoses: Secondary | ICD-10-CM | POA: Diagnosis not present

## 2024-05-12 DIAGNOSIS — I6389 Other cerebral infarction: Secondary | ICD-10-CM | POA: Diagnosis not present

## 2024-05-12 DIAGNOSIS — T502X5A Adverse effect of carbonic-anhydrase inhibitors, benzothiadiazides and other diuretics, initial encounter: Secondary | ICD-10-CM | POA: Diagnosis present

## 2024-05-12 DIAGNOSIS — F329 Major depressive disorder, single episode, unspecified: Secondary | ICD-10-CM | POA: Diagnosis present

## 2024-05-12 DIAGNOSIS — I639 Cerebral infarction, unspecified: Principal | ICD-10-CM | POA: Diagnosis present

## 2024-05-12 DIAGNOSIS — Z79899 Other long term (current) drug therapy: Secondary | ICD-10-CM | POA: Diagnosis not present

## 2024-05-12 DIAGNOSIS — R29704 NIHSS score 4: Secondary | ICD-10-CM | POA: Diagnosis not present

## 2024-05-12 DIAGNOSIS — E611 Iron deficiency: Secondary | ICD-10-CM | POA: Diagnosis present

## 2024-05-12 LAB — URINALYSIS, W/ REFLEX TO CULTURE (INFECTION SUSPECTED)
Bilirubin Urine: NEGATIVE
Glucose, UA: NEGATIVE mg/dL
Hgb urine dipstick: NEGATIVE
Ketones, ur: 5 mg/dL — AB
Leukocytes,Ua: NEGATIVE
Nitrite: NEGATIVE
Protein, ur: NEGATIVE mg/dL
Specific Gravity, Urine: 1.004 — ABNORMAL LOW (ref 1.005–1.030)
pH: 7 (ref 5.0–8.0)

## 2024-05-12 LAB — COMPREHENSIVE METABOLIC PANEL WITH GFR
ALT: 18 U/L (ref 0–44)
AST: 44 U/L — ABNORMAL HIGH (ref 15–41)
Albumin: 4.5 g/dL (ref 3.5–5.0)
Alkaline Phosphatase: 94 U/L (ref 38–126)
Anion gap: 11 (ref 5–15)
BUN: 9 mg/dL (ref 8–23)
CO2: 27 mmol/L (ref 22–32)
Calcium: 9.5 mg/dL (ref 8.9–10.3)
Chloride: 91 mmol/L — ABNORMAL LOW (ref 98–111)
Creatinine, Ser: 0.64 mg/dL (ref 0.44–1.00)
GFR, Estimated: >60 mL/min (ref >60)
Glucose, Bld: 91 mg/dL (ref 70–99)
Potassium: 4 mmol/L (ref 3.5–5.1)
Sodium: 130 mmol/L — ABNORMAL LOW (ref 135–145)
Total Bilirubin: 0.4 mg/dL (ref 0.0–1.2)
Total Protein: 7.2 g/dL (ref 6.5–8.1)

## 2024-05-12 LAB — CBC WITH DIFFERENTIAL/PLATELET
Abs Immature Granulocytes: 0.03 K/uL (ref 0.00–0.07)
Basophils Absolute: 0 K/uL (ref 0.0–0.1)
Basophils Relative: 0 %
Eosinophils Absolute: 0 K/uL (ref 0.0–0.5)
Eosinophils Relative: 0 %
HCT: 37.7 % (ref 36.0–46.0)
Hemoglobin: 12.7 g/dL (ref 12.0–15.0)
Immature Granulocytes: 0 %
Lymphocytes Relative: 5 %
Lymphs Abs: 0.5 K/uL — ABNORMAL LOW (ref 0.7–4.0)
MCH: 28.6 pg (ref 26.0–34.0)
MCHC: 33.7 g/dL (ref 30.0–36.0)
MCV: 84.9 fL (ref 80.0–100.0)
Monocytes Absolute: 0.7 K/uL (ref 0.1–1.0)
Monocytes Relative: 7 %
Neutro Abs: 8.2 K/uL — ABNORMAL HIGH (ref 1.7–7.7)
Neutrophils Relative %: 88 %
Platelets: 486 K/uL — ABNORMAL HIGH (ref 150–400)
RBC: 4.44 MIL/uL (ref 3.87–5.11)
RDW: 13.1 % (ref 11.5–15.5)
WBC: 9.4 K/uL (ref 4.0–10.5)
nRBC: 0 % (ref 0.0–0.2)

## 2024-05-12 LAB — TROPONIN T, HIGH SENSITIVITY
Troponin T High Sensitivity: 25 ng/L — ABNORMAL HIGH (ref 0–19)
Troponin T High Sensitivity: 23 ng/L — ABNORMAL HIGH (ref 0–19)

## 2024-05-12 LAB — SODIUM, URINE, RANDOM: Sodium, Ur: 70 mmol/L

## 2024-05-12 MED ORDER — ACETAMINOPHEN 650 MG RE SUPP: 650.0000 mg | Freq: Four times a day (QID) | RECTAL | Status: DC | PRN

## 2024-05-12 MED ORDER — IOHEXOL 350 MG/ML SOLN
75.0000 mL | Freq: Once | INTRAVENOUS | Status: AC | PRN
  Administered 2024-05-12: 75 mL via INTRAVENOUS

## 2024-05-12 MED ORDER — SENNOSIDES-DOCUSATE SODIUM 8.6-50 MG PO TABS: 1.0000 | ORAL_TABLET | Freq: Every evening | ORAL | Status: DC | PRN

## 2024-05-12 MED ORDER — ASPIRIN 300 MG RE SUPP
300.0000 mg | Freq: Every day | RECTAL | Status: DC
  Filled 2024-05-12: qty 1

## 2024-05-12 MED ORDER — SODIUM CHLORIDE 0.9 % IV BOLUS
1000.0000 mL | Freq: Once | INTRAVENOUS | Status: AC
  Administered 2024-05-12: 1000 mL via INTRAVENOUS

## 2024-05-12 MED ORDER — ACETAMINOPHEN 325 MG PO TABS: 650.0000 mg | ORAL_TABLET | Freq: Four times a day (QID) | ORAL | Status: DC | PRN

## 2024-05-12 MED ORDER — ROSUVASTATIN CALCIUM 20 MG PO TABS
40.0000 mg | ORAL_TABLET | Freq: Every day | ORAL | Status: DC
  Administered 2024-05-12: 40 mg via ORAL
  Filled 2024-05-12: qty 2

## 2024-05-12 MED ORDER — CLOPIDOGREL BISULFATE 75 MG PO TABS
75.0000 mg | ORAL_TABLET | Freq: Every day | ORAL | Status: DC
  Administered 2024-05-12 – 2024-05-20 (×9): 75 mg via ORAL
  Filled 2024-05-12 (×9): qty 1

## 2024-05-12 MED ORDER — STROKE: EARLY STAGES OF RECOVERY BOOK
Freq: Once | Status: DC
  Filled 2024-05-12 (×2): qty 1

## 2024-05-12 MED ORDER — ASPIRIN 325 MG PO TABS
325.0000 mg | ORAL_TABLET | Freq: Every day | ORAL | Status: DC
  Administered 2024-05-12 – 2024-05-13 (×2): 325 mg via ORAL
  Filled 2024-05-12 (×2): qty 1

## 2024-05-12 MED ORDER — ENOXAPARIN SODIUM 40 MG/0.4ML IJ SOSY
40.0000 mg | PREFILLED_SYRINGE | INTRAMUSCULAR | Status: DC
  Administered 2024-05-12 – 2024-05-19 (×8): 40 mg via SUBCUTANEOUS
  Filled 2024-05-12 (×8): qty 0.4

## 2024-05-12 NOTE — Hospital Course (Addendum)
 Michele Tyler is a 78 y.o. female with a history of hypertension, hyperlipidemia.  Patient presented secondary to dizziness and unsteadiness with evidence of a subacute stroke. Neurology consulted. Patient started on Aspirin/Plavix combination with recommendations for SNF discharge. Hospitalization complicated by mild hyponatremia.

## 2024-05-12 NOTE — Plan of Care (Signed)
 On-call neurologist note  To call from ED provider at [facility] regarding this patient is coming with gait ataxia and limb ataxia.  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 Recommended admission to hospitalist [facility], we will follow in neurological consultation and perform stroke risk factor workup.  Last known well is Sunday-outside the window for any intervention at this time  Eligio Lav, MD Neurology

## 2024-05-12 NOTE — ED Triage Notes (Signed)
 Pt BIB EMS from urgent care due to Hyponatremia. Pt c/o dizziness and weakness since Friday.

## 2024-05-12 NOTE — ED Provider Notes (Signed)
 78 yo female here with dizziness Possible subacute stroke on CT imaging Pending CT angio and MRI Na 130, chronic hyponatremia - IV fluid bolus ordered  Physical Exam  BP (!) 148/56 (BP Location: Left Arm)   Pulse 98   Temp 97.7 F (36.5 C) (Oral)   Resp (!) 25   SpO2 97%   Physical Exam  Procedures  Procedures  ED Course / MDM   Clinical Course as of 05/12/24 2012  Wed May 12, 2024  1937 Dr Voncile - neurology - recommending admission and transfer to [facility] for stroke team evaluation [MT]  2012 Admitted to hospitalist [MT]    Clinical Course User Index [MT] Cottie Donnice PARAS, MD   Medical Decision Making Amount and/or Complexity of Data Reviewed Labs: ordered. Radiology: ordered.  Risk Prescription drug management. Decision regarding hospitalization.         Cottie Donnice PARAS, MD 05/12/24 2013

## 2024-05-12 NOTE — Discharge Instructions (Addendum)
 You need further evaluation for your dizziness, please report to the ER.

## 2024-05-12 NOTE — ED Triage Notes (Signed)
 Pt c/o dizziness and weakness  Onset 6 days ago Also st's her blood glucose is low

## 2024-05-12 NOTE — ED Provider Notes (Signed)
 [place] EMERGENCY DEPARTMENT AT [facility] Provider Note   CSN: [id number] Arrival date & time: 05/12/24  1318     Patient presents with: Abnormal Labs (Hyponatremia), Dizziness, and Weakness   Michele Tyler is a 78 y.o. female.  {Add pertinent medical, surgical, social history, OB history to HPI:745} 78 year old female with prior medical history as detailed below presents for evaluation.  Patient reports that she has been dizzy for the last 5 to 6 days.  She reports that on Saturday she noted that she was unsteady with attempting gait.  She reports that she has to hold onto things in order to avoid falling over.  She denies headache.  She denies vision change.  She denies nausea or vomiting.  She presented to urgent care with this complaint earlier today.  Patient was advised to come to the ED for evaluation.  Patient reports a distant history of hyponatremia.  She denies recent issues with sodium.  She denies prior history of stroke.  She lives alone.  She has 6 dogs.  She is very concerned about their wellbeing.  The history is provided by the patient and medical records.       Prior to Admission medications   Medication Sig Start Date End Date Taking? Authorizing Provider  Apple Cider Vinegar 600 MG CAPS Take 1 capsule by mouth daily.    [provider]  Ascorbic Acid (VITAMIN C CR) 1500 MG TBCR Take 1 tablet by mouth daily.    [provider]  atorvastatin  (LIPITOR) 10 MG tablet TAKE 1 TABLET BY MOUTH AT  BEDTIME 08/07/23   [name], MD  B Complex-Folic Acid  (BALANCED B-150 PO) Take by mouth.    [provider]  buPROPion  (WELLBUTRIN  XL) 150 MG 24 hr tablet TAKE 1 TABLET BY MOUTH DAILY 12/17/23   [name], MD  busPIRone  (BUSPAR ) 7.5 MG tablet TAKE 1 TABLET BY MOUTH 3 TIMES  DAILY 12/25/23   [name], MD  CALCIUM -MAGNESUIUM-ZINC 333-133-8.3 MG TABS Take 4 tablets by mouth daily. 1200 mg daily    [provider]  Cholecalciferol (VITAMIN D3) 5000 units CAPS Take 1 capsule by mouth daily.    [provider]  clindamycin (CLEOCIN) 300 MG capsule Take 300 mg by mouth every 12 (twelve) hours. 07/09/23   [provider]  Cyanocobalamin (B-12) 1000 MCG CAPS Take 1 capsule by mouth.    [provider]  Ferrous Sulfate (IRON) 325 (65 Fe) MG TABS Take 1 tablet by mouth daily.    [provider]  folic acid  (FOLVITE ) 800 MCG tablet Take 800 mcg by mouth daily. 1333 MCG    [provider]  olmesartan  (BENICAR ) 20 MG tablet TAKE 1 TABLET BY MOUTH DAILY 04/20/24   [name], MD  Omega-3 Fatty Acids (OMEGA-3 FISH OIL PO) Take 4,000 mg by mouth daily. Contains omega 3 450 mg and fish oil 1500 mg    [provider]  ondansetron  (ZOFRAN ) 4 MG tablet Take 1 tablet (4 mg total) by mouth every 8 (eight) hours as needed for nausea or vomiting. 02/21/22   Abonza, Maritza, PA-C  Soy Isoflavone 750 MG CAPS Take 1 capsule by mouth daily.    [provider]  TURMERIC PO Take 800 mg by mouth.    [provider]  vitamin A 8000 UNIT capsule Take 8,000 Units by mouth daily.    [provider]  VITAMIN E-1000 PO Take 1 capsule by mouth daily.  [provider]  Vitamins-Lipotropics (BALANCED B-150 COMPLEX TR PO) Take 1 tablet by mouth daily. Contains b1 150mg , b2 150mg , b6 150mg , b12 150mg , niacin 150 mg    [provider]    Allergies: Codeine    Review of Systems  All other systems reviewed and are negative.   Updated Vital Signs BP (!) 165/90 (BP Location: Left Arm)   Pulse 86   Temp (!) 97.3 F (36.3 C) (Oral)   Resp 18   SpO2 98%   Physical Exam Vitals and nursing note reviewed.  Constitutional:      General: She is not in acute distress.    Appearance: Normal appearance. She is well-developed.  HENT:     Head: Normocephalic and atraumatic.  Eyes:     Conjunctiva/sclera: Conjunctivae normal.     Pupils:  Pupils are equal, round, and reactive to light.  Cardiovascular:     Rate and Rhythm: Normal rate and regular rhythm.     Heart sounds: Normal heart sounds.  Pulmonary:     Effort: Pulmonary effort is normal. No respiratory distress.     Breath sounds: Normal breath sounds.  Abdominal:     General: There is no distension.     Palpations: Abdomen is soft.     Tenderness: There is no abdominal tenderness.  Musculoskeletal:        General: No deformity. Normal range of motion.     Cervical back: Normal range of motion and neck supple.  Skin:    General: Skin is warm and dry.  Neurological:     General: No focal deficit present.     Mental Status: She is alert and oriented to person, place, and time. Mental status is at baseline.     Cranial Nerves: No cranial nerve deficit.     Motor: No weakness.     (all labs ordered are listed, but only abnormal results are displayed) Labs Reviewed  CBC WITH DIFFERENTIAL/PLATELET - Abnormal; Notable for the following components:      Result Value   Platelets 486 (*)    Neutro Abs 8.2 (*)    Lymphs Abs 0.5 (*)    All other components within normal limits  COMPREHENSIVE METABOLIC PANEL WITH GFR  URINALYSIS, W/ REFLEX TO CULTURE (INFECTION SUSPECTED)  TROPONIN T, HIGH SENSITIVITY    EKG: None  Radiology: CT Head Wo Contrast Result Date: 05/12/2024 EXAM: CT HEAD WITHOUT CONTRAST 05/12/2024 01:56:16 PM TECHNIQUE: CT of the head was performed without the administration of intravenous contrast. Automated exposure control, iterative reconstruction, and/or weight based adjustment of the mA/kV was utilized to reduce the radiation dose to as low as reasonably achievable. COMPARISON: None available. CLINICAL HISTORY: Vertigo, peripheral. Pt BIB EMS from urgent care due to Hyponatremia. Pt c/o dizziness and weakness since Friday. FINDINGS: BRAIN AND VENTRICLES: No acute hemorrhage. No evidence of acute infarct. 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 Remote infarct in the right occipital lobe and additional remote infarcts in the posterior lateral right temporal lobe. Small remote infarcts in the left cerebellum. There is likely an additional remote infarct in the left thalamus. Atherosclerosis of the carotid siphons. Moderate chronic microvascular ischemic change. No hydrocephalus. No extra-axial collection. No mass effect or midline shift. ORBITS: No acute abnormality. SINUSES: No acute abnormality. SOFT TISSUES AND SKULL: No acute soft tissue abnormality. No skull fracture. IMPRESSION: 1. Possible subacute infarct in the superior right cerebellum. Recommend MRI for further evaluation. 2. Additional remote infarcts  in the right cerebellum, right occipital lobe, and posterolateral right temporal lobe. Small remote infarcts in the left cerebellum. Likely additional remote infarct in the left thalamus. 3. Moderate chronic microvascular ischemic change. Electronically signed by: Donnice Mania MD 05/12/2024 02:16 PM EDT RP Workstation: [id number]   DG Chest Port 1 View Result Date: 05/12/2024 CLINICAL DATA:  Dizziness and weakness. EXAM: PORTABLE CHEST 1 VIEW COMPARISON:  May 12, 2024 FINDINGS: The heart size and mediastinal contours are within normal limits. Mild, stable atelectasis and/or infiltrate is seen within the left lung base. Very mild atelectatic changes are also seen within the lateral aspect of the right lung base. There is a small, stable left pleural effusion. No pneumothorax is identified. The visualized skeletal structures are unremarkable. IMPRESSION: 1. Mild, stable left basilar atelectasis and/or infiltrate. 2. Very mild right basilar atelectasis. 3. Small, stable left pleural effusion. Electronically Signed   By: Suzen Dials M.D.   On: 05/12/2024 14:06   DG Chest 2 View Result Date: 05/12/2024 EXAM: 2 VIEW(S) XRAY OF THE CHEST 05/12/2024 11:07:25 AM COMPARISON: None available. CLINICAL  HISTORY: dizziness. Pt c/o dizziness and weakness Onset 6 days ago Also st's her blood glucose is low FINDINGS: LUNGS AND PLEURA: Small left pleural effusion. Left basilar atelectasis. No focal pulmonary opacity. No pulmonary edema. No pneumothorax. HEART AND MEDIASTINUM: Calcified aorta. No acute abnormality of the cardiac and mediastinal silhouettes. BONES AND SOFT TISSUES: No acute osseous abnormality. IMPRESSION: 1. Small left pleural effusion with left basilar atelectasis. Electronically signed by: Oneil Devonshire MD 05/12/2024 11:29 AM EDT RP Workstation: [id number]    {Document cardiac monitor, telemetry assessment procedure when appropriate:32947} Procedures   Medications Ordered in the ED - No data to display    {Click here for ABCD2, HEART and other calculators REFRESH Note before signing:1}                              Medical Decision Making Amount and/or Complexity of Data Reviewed Labs: ordered. Radiology: ordered.   ***  {Document critical care time when appropriate  Document review of labs and clinical decision tools ie CHADS2VASC2, etc  Document your independent review of radiology images and any outside records  Document your discussion with family members, caretakers and with consultants  Document social determinants of health affecting pt's care  Document your decision making why or why not admission, treatments were needed:32947:::1}   Final diagnoses:  None    ED Discharge Orders     None

## 2024-05-12 NOTE — ED Provider Notes (Addendum)
 [facility]    CSN: [id number] Arrival date & time: 05/12/24  1025      History   Chief Complaint Chief Complaint  Patient presents with   Dizziness    HPI Michele Tyler is a 78 y.o. female.   Pt presents today due to dizziness for the past 6 days. Pt was seen by her PCP 2 days ago and they performed blood work that was found to be unremarkable. Pt states that she is still experiencing dizziness, states that she feels when she stands from a sitting position it takes her a minute to catch her balance, pt denies room spinning, shortness of breath, chest pain, or changes in urinary habits. Pt states that she is eating and drinking without issue.     Dizziness   Past Medical History:  Diagnosis Date   Anxiety    Depression    Hypertension     Patient Active Problem List   Diagnosis Date Noted   Dizziness 05/11/2024   Nausea 04/12/2024   Hyponatremia 01/06/2024   Urinary and fecal incontinence 11/27/2023   Itchy scalp 09/05/2023   Iron deficiency 07/04/2019   B12 deficiency 07/04/2019   Lipoma 07/04/2019   Fear of other medical care- signficant fears of drug S-E inhibiting pt's ability to take medications 07/02/2019   Insomnia 03/25/2018   Hyperlipidemia 03/25/2018   Hypertension, goal below 150/90 02/18/2018   Mood disorder 02/18/2018   Generalized anxiety disorder 03/19/2013    Past Surgical History:  Procedure Laterality Date   DILATION AND CURETTAGE OF UTERUS  2002   FACELIFT  2005    OB History   No obstetric history on file.      Home Medications    Prior to Admission medications   Medication Sig Start Date End Date Taking? Authorizing Provider  Apple Cider Vinegar 600 MG CAPS Take 1 capsule by mouth daily.    [provider]  Ascorbic Acid (VITAMIN C CR) 1500 MG TBCR Take 1 tablet by mouth daily.    [provider]  atorvastatin  (LIPITOR) 10 MG tablet TAKE 1 TABLET BY MOUTH AT  BEDTIME 08/07/23   [name],  MD  B Complex-Folic Acid  (BALANCED B-150 PO) Take by mouth.    [provider]  buPROPion  (WELLBUTRIN  XL) 150 MG 24 hr tablet TAKE 1 TABLET BY MOUTH DAILY 12/17/23   [name], MD  busPIRone  (BUSPAR ) 7.5 MG tablet TAKE 1 TABLET BY MOUTH 3 TIMES  DAILY 12/25/23   [name], MD  CALCIUM -MAGNESUIUM-ZINC 333-133-8.3 MG TABS Take 4 tablets by mouth daily. 1200 mg daily    [provider]  Cholecalciferol (VITAMIN D3) 5000 units CAPS Take 1 capsule by mouth daily.    [provider]  clindamycin (CLEOCIN) 300 MG capsule Take 300 mg by mouth every 12 (twelve) hours. 07/09/23   [provider]  Cyanocobalamin (B-12) 1000 MCG CAPS Take 1 capsule by mouth.    [provider]  Ferrous Sulfate (IRON) 325 (65 Fe) MG TABS Take 1 tablet by mouth daily.    [provider]  folic acid  (FOLVITE ) 800 MCG tablet Take 800 mcg by mouth daily. 1333 MCG    [provider]  olmesartan  (BENICAR ) 20 MG tablet TAKE 1 TABLET BY MOUTH DAILY 04/20/24   [name], MD  Omega-3 Fatty Acids (OMEGA-3 FISH OIL PO) Take 4,000 mg by mouth daily. Contains omega 3 450 mg and fish oil 1500 mg    [provider]  ondansetron  (ZOFRAN ) 4 MG tablet Take 1 tablet (4 mg total) by mouth every 8 (eight) hours as needed for nausea or vomiting. 02/21/22   Abonza, Maritza, PA-C  Soy Isoflavone 750 MG CAPS Take 1 capsule by mouth daily.    [provider]  TURMERIC PO Take 800 mg by mouth.    [provider]  vitamin A 8000 UNIT capsule Take 8,000 Units by mouth daily.    [provider]  VITAMIN E-1000 PO Take 1 capsule by mouth daily.    [provider]  Vitamins-Lipotropics (BALANCED B-150 COMPLEX TR PO) Take 1 tablet by mouth daily. Contains b1 150mg , b2 150mg , b6 150mg , b12 150mg , niacin 150 mg    [provider]    Family History Family History  Problem Relation Age of Onset   Hypertension Mother    Depression  Father    Hypertension Father     Social History Social History   Tobacco Use   Smoking status: Former    Current packs/day: 0.00    Average packs/day: 1 pack/day for 10.0 years (10.0 ttl pk-yrs)    Types: Cigarettes    Start date: 07/29/1976    Quit date: 1988    Years since quitting: 37.8   Smokeless tobacco: Never  Vaping Use   Vaping status: Never Used  Substance Use Topics   Alcohol use: Yes    Alcohol/week: 1.0 - 2.0 standard drink of alcohol    Types: 1 - 2 Standard drinks or equivalent per week    Comment: social   Drug use: No     Allergies   Codeine   Review of Systems Review of Systems  Neurological:  Positive for dizziness.     Physical Exam Triage Vital Signs ED Triage Vitals  Encounter Vitals Group     BP      Girls Systolic BP Percentile      Girls Diastolic BP Percentile      Boys Systolic BP Percentile      Boys Diastolic BP Percentile      Pulse      Resp      Temp      Temp src      SpO2      Weight      Height      Head Circumference      Peak Flow      Pain Score      Pain Loc      Pain Education      Exclude from Growth Chart    No data found.  Updated Vital Signs BP (!) 147/79   Pulse (!) 104   Temp 97.7 F (36.5 C) (Oral)   Resp (!) 24   Ht 5' 4 (1.626 m)   Wt 112 lb (50.8 kg)   SpO2 94%   BMI 19.22 kg/m   Visual Acuity Right Eye Distance:   Left Eye Distance:   Bilateral Distance:    Right Eye Near:   Left Eye Near:    Bilateral Near:     Physical Exam Vitals and nursing note reviewed.  Constitutional:      General: She is not in acute distress.    Appearance: She is not ill-appearing, toxic-appearing or diaphoretic.  Eyes:     General: No scleral icterus. Cardiovascular:     Rate and Rhythm: Tachycardia present.     Heart sounds: Normal heart sounds.  Pulmonary:     Effort: Pulmonary effort is  normal. No respiratory distress.     Breath sounds: Normal breath sounds. No wheezing or rhonchi.   Abdominal:     General: Abdomen is flat. Bowel sounds are normal.     Palpations: Abdomen is soft.     Tenderness: There is no abdominal tenderness. There is no right CVA tenderness or left CVA tenderness.  Skin:    General: Skin is warm.  Neurological:     Mental Status: She is alert and oriented to person, place, and time.  Psychiatric:        Mood and Affect: Mood normal.        Behavior: Behavior normal.      UC Treatments / Results  Labs (all labs ordered are listed, but only abnormal results are displayed) Labs Reviewed  POCT URINE DIPSTICK    EKG   Radiology No results found.  Procedures Procedures (including critical care time)  Medications Ordered in UC Medications - No data to display  Initial Impression / Assessment and Plan / UC Course  I have reviewed the triage vital signs and the nursing notes.  Pertinent labs & imaging results that were available during my care of the patient were reviewed by me and considered in my medical decision making (see chart for details).     Dizziness-EKG shows no signs of ST elevation or depression, normal sinus rhythm, pt was unable to produce a urine sample in office. Pt advised to report to ER for further evaluation of intractable dizziness. Concern that he dizziness may be due to UTI or small pleural effusion and atelectasis noted on chest xray. Pt's mentation seems altered and she presents to office malodorous in dirty clothing.  Final Clinical Impressions(s) / UC Diagnoses   Final diagnoses:  Dizziness and giddiness  Dizziness   Discharge Instructions   None    ED Prescriptions   None    PDMP not reviewed this encounter.   Andra Corean BROCKS, PA-C 05/12/24 1213    Andra Corean C, PA-C 05/12/24 1330

## 2024-05-12 NOTE — H&P (Signed)
 History and Physical    Royalty Fakhouri FMW:[id number] DOB: 07-19-1946 DOA: 05/12/2024  DOS: the patient was seen and examined on 05/12/2024  PCP: [name], MD   Patient coming from: Home  I have personally briefly reviewed patient's old medical records in [facility] and CareEverywhere  HPI:  Patient is a 78 year old female with history of hypertension hyperlipidemia presenting with 5 days of dizziness and unsteadiness found to have subacute stroke.  Patient states she has been feeling dizzy since Friday night but has been moving around using support from her surroundings.  States she went to see her PCP on Monday who attributed her symptoms to hypoglycemia.  Patient states since original improvement she decided to come to the ED.  She states she has been driving.  She shows concern regarding her dogs and states she would like to go home soon as possible.  Advised we need to perform additional testing to find a exact etiology of your stroke.  In the ED patient was hemodynamically stable.  Exam was concerning for stroke so imaging was obtained that showed subacute infarct.  ED Course: On arrival to the ED patient was noted to be HDS stable.    TRH contacted for admission.  Review of Systems: As mentioned in the history of present illness. All other systems reviewed and are negative.   Past Medical History:  Diagnosis Date   Anxiety    Depression    Hypertension     Past Surgical History:  Procedure Laterality Date   DILATION AND CURETTAGE OF UTERUS  2002   FACELIFT  2005     Allergies  Allergen Reactions   Codeine Nausea Only    Family History  Problem Relation Age of Onset   Hypertension Mother    Depression Father    Hypertension Father     Prior to Admission medications   Medication Sig Start Date End Date Taking? Authorizing Provider  Apple Cider Vinegar 600 MG CAPS Take 1 capsule by mouth daily.    [provider]  Ascorbic Acid  (VITAMIN C CR) 1500 MG TBCR Take 1 tablet by mouth daily.    [provider]  atorvastatin  (LIPITOR) 10 MG tablet TAKE 1 TABLET BY MOUTH AT  BEDTIME 08/07/23   [name], MD  B Complex-Folic Acid  (BALANCED B-150 PO) Take by mouth.    [provider]  buPROPion  (WELLBUTRIN  XL) 150 MG 24 hr tablet TAKE 1 TABLET BY MOUTH DAILY 12/17/23   [name], MD  busPIRone  (BUSPAR ) 7.5 MG tablet TAKE 1 TABLET BY MOUTH 3 TIMES  DAILY 12/25/23   [name], MD  CALCIUM -MAGNESUIUM-ZINC 333-133-8.3 MG TABS Take 4 tablets by mouth daily. 1200 mg daily    [provider]  Cholecalciferol (VITAMIN D3) 5000 units CAPS Take 1 capsule by mouth daily.    [provider]  clindamycin (CLEOCIN) 300 MG capsule Take 300 mg by mouth every 12 (twelve) hours. 07/09/23   [provider]  Cyanocobalamin (B-12) 1000 MCG CAPS Take 1 capsule by mouth.    [provider]  Ferrous Sulfate (IRON) 325 (65 Fe) MG TABS Take 1 tablet by mouth daily.    [provider]  folic acid  (FOLVITE ) 800 MCG tablet Take 800 mcg by mouth daily. 1333 MCG    [provider]  olmesartan  (BENICAR ) 20 MG tablet TAKE 1 TABLET BY MOUTH DAILY 04/20/24   [name], MD  Omega-3 Fatty Acids (OMEGA-3 FISH OIL PO)  Take 4,000 mg by mouth daily. Contains omega 3 450 mg and fish oil 1500 mg    [provider]  ondansetron  (ZOFRAN ) 4 MG tablet Take 1 tablet (4 mg total) by mouth every 8 (eight) hours as needed for nausea or vomiting. 02/21/22   Abonza, Maritza, PA-C  Soy Isoflavone 750 MG CAPS Take 1 capsule by mouth daily.    [provider]  TURMERIC PO Take 800 mg by mouth.    [provider]  vitamin A 8000 UNIT capsule Take 8,000 Units by mouth daily.    [provider]  VITAMIN E-1000 PO Take 1 capsule by mouth daily.    [provider]  Vitamins-Lipotropics (BALANCED B-150 COMPLEX TR PO) Take 1 tablet by mouth daily. Contains b1  150mg , b2 150mg , b6 150mg , b12 150mg , niacin 150 mg    [provider]      reports that she quit smoking about 37 years ago. Her smoking use included cigarettes. She started smoking about 47 years ago. She has a 10 pack-year smoking history. She has never used smokeless tobacco. She reports current alcohol use of about 1.0 - 2.0 standard drink of alcohol per week. She reports that she does not use drugs. Lives by self. Has 6 dogs.  Tobacco- Denies use  Denies smoking, states used 2 ppd for 20 years. Quit 1988. Drinks wine occasionally. . EtOH- Occasional wine use.  Illicit drug use- denies use.  IADLs/ADLs- can person independently at baseline    Physical Exam: Vitals:   05/12/24 2330 05/12/24 2345 05/12/24 2348 05/12/24 2349  BP: (!) 143/80 (!) 158/82 (!) 158/82   Pulse:  84    Resp: (!) 29 (!) 25 (!) 21   Temp:   97.8 F (36.6 C) 97.8 F (36.6 C)  TempSrc:   Oral Oral  SpO2:  100%     Physical Exam General: NAD HENT: NCAT Lungs: CTAB, no wheeze, rhonchi or rales.  Cardiovascular: Normal heart sounds, no r/m/g, 2+ pulses in all extremities. No LE edema Abdomen: No TTP, normal bowel sounds MSK: No asymmetry or muscle atrophy.  Skin: no lesions noted on exposed skin Neuro: Alert and oriented x4.  CN II to XII intact, horizontal nystagmus present.  5 out of 5 strength in all extremities, good sensation in all extremities, normal heel-to-shin, abnormal finger-to-nose on the right but normal on the left, positive Romberg. Psych: Normal mood and normal affect    Labs on Admission: I have personally reviewed following labs and imaging studies  CBC: Recent Labs  Lab 05/10/24 1432 05/12/24 1420  WBC 9.0 9.4  NEUTROABS 7.6* 8.2*  HGB 12.5 12.7  HCT 38.2 37.7  MCV 89 84.9  PLT 497* 486*   Basic Metabolic Panel: Recent Labs  Lab 05/10/24 1432 05/12/24 1420  NA 131* 130*  K 4.6 4.0  CL 91* 91*  CO2 26 27  GLUCOSE 91 91  BUN 12 9  CREATININE 0.69 0.64   CALCIUM  9.8 9.5   GFR: Estimated Creatinine Clearance: 48 mL/min (by C-G formula based on SCr of 0.64 mg/dL). Liver Function Tests: Recent Labs  Lab 05/10/24 1432 05/12/24 1420  AST 36 44*  ALT 19 18  ALKPHOS 98 94  BILITOT 0.3 0.4  PROT 6.5 7.2  ALBUMIN 4.3 4.5   No results for input(s): LIPASE, AMYLASE in the last 168 hours. No results for input(s): AMMONIA in the last 168 hours. Coagulation Profile: No results for input(s): INR, PROTIME in the last 168  hours. Cardiac Enzymes: No results for input(s): CKTOTAL, CKMB, CKMBINDEX, TROPONINI, TROPONINIHS in the last 168 hours. BNP (last 3 results) No results for input(s): BNP in the last 8760 hours. HbA1C: No results for input(s): HGBA1C in the last 72 hours. CBG: No results for input(s): GLUCAP in the last 168 hours. Lipid Profile: No results for input(s): CHOL, HDL, LDLCALC, TRIG, CHOLHDL, LDLDIRECT in the last 72 hours. Thyroid  Function Tests: Recent Labs    05/10/24 1432  TSH 0.957   Anemia Panel: No results for input(s): VITAMINB12, FOLATE, FERRITIN, TIBC, IRON, RETICCTPCT in the last 72 hours. Urine analysis:    Component Value Date/Time   COLORURINE STRAW (A) 05/12/2024 1604   APPEARANCEUR HAZY (A) 05/12/2024 1604   LABSPEC 1.004 (L) 05/12/2024 1604   PHURINE 7.0 05/12/2024 1604   GLUCOSEU NEGATIVE 05/12/2024 1604   HGBUR NEGATIVE 05/12/2024 1604   BILIRUBINUR NEGATIVE 05/12/2024 1604   KETONESUR 5 (A) 05/12/2024 1604   PROTEINUR NEGATIVE 05/12/2024 1604   NITRITE NEGATIVE 05/12/2024 1604   LEUKOCYTESUR NEGATIVE 05/12/2024 1604    Radiological Exams on Admission: I have personally reviewed images CT ANGIO HEAD NECK W WO CM Result Date: 05/12/2024 EXAM: CTA HEAD AND NECK WITHOUT AND WITH 05/12/2024 07:25:25 PM TECHNIQUE: CTA of the head and neck was performed without and with the administration of 75 mL of iohexol (OMNIPAQUE) 350 MG/ML injection.  Multiplanar 2D and/or 3D reformatted images are provided for review. Automated exposure control, iterative reconstruction, and/or weight based adjustment of the mA/kV was utilized to reduce the radiation dose to as low as reasonably achievable. Stenosis of the internal carotid arteries measured using NASCET criteria. COMPARISON: Comparison with prior CT and MRI from earlier the same day. CLINICAL HISTORY: Stroke/TIA, determine embolic source. Pt presents today due to dizziness for the past 6 days. Pt was seen by her PCP 2 days ago and they performed blood work that was found to be unremarkable. Pt states that she is still experiencing dizziness, states that she feels when she stands from a sitting position it takes her a minute to catch her balance, pt denies room spinning. FINDINGS: LIMITATIONS: Examination degraded by motion artifact. CTA NECK: AORTIC ARCH AND ARCH VESSELS: Mild aortic atherosclerosis. No dissection or arterial injury. No significant stenosis of the brachiocephalic or subclavian arteries. CERVICAL CAROTID ARTERIES: No dissection, arterial injury, or hemodynamically significant stenosis by NASCET criteria. CERVICAL VERTEBRAL ARTERIES: Right vertebral artery dominant. No dissection, arterial injury, or significant stenosis. LUNGS AND MEDIASTINUM: Moderate layering bilateral pleural effusions, left larger than right. Mild pulmonary interstitial congestion within the visualized aerated portions of the lungs. SOFT TISSUES: No acute abnormality. BONES: Grade 1 degenerative anterolisthesis of C4 on C5. Moderate cervical spondylosis at C5-C6 and C6-C7. CTA HEAD: ANTERIOR CIRCULATION: No significant stenosis of the internal carotid arteries. No significant stenosis of the anterior cerebral arteries. No significant stenosis of the middle cerebral arteries. No aneurysm. POSTERIOR CIRCULATION: Right PCA supplied via the basilar as well as a prominent right posterior carotid artery. Fetal type origin of the  left PCA. No significant stenosis of the posterior cerebral arteries. No significant stenosis of the basilar artery. No significant stenosis of the vertebral arteries. No aneurysm. OTHER: No dural venous sinus thrombosis on this non-dedicated study. IMPRESSION: 1. Negative CTA with no large vessel occlusion or other emergent findings. Mild premature atheromatous disease without hemodynamically significant or correctable stenosis. 2. Moderate layering pleural effusions, left greater than right, with mild pulmonary interstitial congestion within the visualized lungs. Electronically signed by:  Morene Hoard MD 05/12/2024 07:46 PM EDT RP Workstation: [id number]   MR BRAIN WO CONTRAST Result Date: 05/12/2024 EXAM: MRI BRAIN WITHOUT CONTRAST 05/12/2024 05:53:55 PM TECHNIQUE: Multiplanar multisequence MRI of the head/brain was performed without the administration of intravenous contrast. COMPARISON: Comparison with prior head CT from earlier the same day. CLINICAL HISTORY: FINDINGS: BRAIN AND VENTRICLES: No midline shift. No hydrocephalus. Patchy and confluent T2 FLAIR signal abnormality involving the periventricular and deep white matter of both cerebral hemispheres as well as the pons, consistent with chronic subvascular ischemic disease, advanced in nature. Few scattered remote lacunar infarcts about the hemispheric cerebral white matter. Few scattered remote cerebellar infarcts noted. Early subacute ischemic infarct involving the superior right cerebellum, right SCA distribution (series 5, image 13). Area of infarction measures up to 4.2 cm. Multiple additional scattered early subacute infarcts seen involving the right greater than left cortical to subcortical cerebral hemispheres. Few scattered foci of associated petechial blood products noted. No significant regional mass effect. Few scattered chronic microhemorrhages noted, likely small vessel hypertensive in nature. The sella is unremarkable. Normal flow  voids. ORBITS: No acute abnormality. SINUSES AND MASTOIDS: No acute abnormality. BONES AND SOFT TISSUES: Normal marrow signal. 1.9 cm nodule lesion present at the left parietal scalp (series 8, image 25), indeterminate. IMPRESSION: 1. Early subacute infarct of the superior right cerebellum in the right SCA territory, with additional scattered early subacute infarcts involving the right greater than left cerebral hemispheres. Mild associated petechial blood products without hemorrhagic transformation or significant regional mass effect. 2. Underlying advanced chronic small vessel ischemic disease with scattered remote lacunar and cerebellar infarcts. Electronically signed by: Morene Hoard MD 05/12/2024 06:09 PM EDT RP Workstation: [id number]   CT Head Wo Contrast Result Date: 05/12/2024 EXAM: CT HEAD WITHOUT CONTRAST 05/12/2024 01:56:16 PM TECHNIQUE: CT of the head was performed without the administration of intravenous contrast. Automated exposure control, iterative reconstruction, and/or weight based adjustment of the mA/kV was utilized to reduce the radiation dose to as low as reasonably achievable. COMPARISON: None available. CLINICAL HISTORY: Vertigo, peripheral. Pt BIB EMS from urgent care due to Hyponatremia. Pt c/o dizziness and weakness since Friday. FINDINGS: BRAIN AND VENTRICLES: No acute hemorrhage. No evidence of acute infarct. Old right cerebellar infarct. Subacute to chronic right superior cerebellar infarct. Remote infarct in the right occipital lobe and additional remote infarcts in the posterior lateral right temporal lobe. Small remote infarcts in the left cerebellum. There is likely an additional remote infarct in the left thalamus. Atherosclerosis of the carotid siphons. Moderate chronic microvascular ischemic change. No hydrocephalus. No extra-axial collection. No mass effect or midline shift. ORBITS: No acute abnormality. SINUSES: No acute abnormality. SOFT TISSUES AND SKULL: No  acute soft tissue abnormality. No skull fracture. IMPRESSION: 1. Possible subacute infarct in the superior right cerebellum. Recommend MRI for further evaluation. 2. Additional remote infarcts in the right cerebellum, right occipital lobe, and posterolateral right temporal lobe. Small remote infarcts in the left cerebellum. Likely additional remote infarct in the left thalamus. 3. Moderate chronic microvascular ischemic change. Electronically signed by: Donnice Mania MD 05/12/2024 02:16 PM EDT RP Workstation: [id number]   DG Chest Port 1 View Result Date: 05/12/2024 CLINICAL DATA:  Dizziness and weakness. EXAM: PORTABLE CHEST 1 VIEW COMPARISON:  May 12, 2024 FINDINGS: The heart size and mediastinal contours are within normal limits. Mild, stable atelectasis and/or infiltrate is seen within the left lung base. Very mild atelectatic changes are also seen within the lateral aspect of the right lung base. There  is a small, stable left pleural effusion. No pneumothorax is identified. The visualized skeletal structures are unremarkable. IMPRESSION: 1. Mild, stable left basilar atelectasis and/or infiltrate. 2. Very mild right basilar atelectasis. 3. Small, stable left pleural effusion. Electronically Signed   By: Suzen Dials M.D.   On: 05/12/2024 14:06   DG Chest 2 View Result Date: 05/12/2024 EXAM: 2 VIEW(S) XRAY OF THE CHEST 05/12/2024 11:07:25 AM COMPARISON: None available. CLINICAL HISTORY: dizziness. Pt c/o dizziness and weakness Onset 6 days ago Also st's her blood glucose is low FINDINGS: LUNGS AND PLEURA: Small left pleural effusion. Left basilar atelectasis. No focal pulmonary opacity. No pulmonary edema. No pneumothorax. HEART AND MEDIASTINUM: Calcified aorta. No acute abnormality of the cardiac and mediastinal silhouettes. BONES AND SOFT TISSUES: No acute osseous abnormality. IMPRESSION: 1. Small left pleural effusion with left basilar atelectasis. Electronically signed by: Oneil Devonshire MD  05/12/2024 11:29 AM EDT RP Workstation: [id number]    EKG: My personal interpretation of EKG shows: sinus rhythm with premature beats.   Assessment/Plan Principal Problem:   Cerebellar stroke (HCC) Active Problems:   Hyperlipidemia   Iron deficiency Cerebral Infarction Pt with symptoms of dizziness and unsteadiness found to have a subacute strokes of right cerebellum, with scattered early subacute infarcts in cerebral hemisphere. No hemorrhagic transformation. Suspect embolic etiology.  Pt outside the window of tPA. Neurology consulted, appreciate their recommendations.  - Allow for permissive HTN in the setting of acute stroke  - ASA 81 mg daily and Plavix 75 mg for 21 days followed by ASA alone - High Intensity Statin - Echocardiogram  - CTA head & neck  - A1C  - Lipid panel  - Tele monitoring  - SLP eval - PT/OT  Hyponatremia Ordered urine studies, could be due to decrease intake from having subacute stroke and difficulty getting around. Mild so will await studies prior to treating as SIADH in setting of stroke is on the ddx.   Thrombocytosis Likely reactive. Monitor daily.    Chronic Problems:  Hypertension: holding BP meds for permissive hypertension. HLD: changed to crestor 40 mg from lipitor 10 mg.  MDD/GAD: Continue home wellbutrin  and buspar . Will defer to PCP as wellbutrin  can lead to anxiety.  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 Inpatient status is judged to be reasonable and necessary in order to provide the required intensity of service to ensure the patient's safety. The patient's  presenting symptoms, physical exam findings, and initial radiographic and laboratory data in the context of their chronic comorbidities is felt to place them at high risk for further clinical deterioration. Furthermore, it is not anticipated that the patient will be medically stable for discharge from the hospital within 2 midnights of admission.   * I certify that at the point of admission it is my clinical judgment that the patient will require inpatient hospital care spanning beyond 2 midnights from the point of admission due to high intensity of service, high risk for further deterioration and high frequency of surveillance required.DEWAINE Morene Bathe, MD [facility] DEL. [facility]

## 2024-05-13 ENCOUNTER — Inpatient Hospital Stay (HOSPITAL_COMMUNITY)

## 2024-05-13 DIAGNOSIS — R29704 NIHSS score 4: Secondary | ICD-10-CM

## 2024-05-13 DIAGNOSIS — I639 Cerebral infarction, unspecified: Secondary | ICD-10-CM

## 2024-05-13 DIAGNOSIS — I6389 Other cerebral infarction: Secondary | ICD-10-CM | POA: Diagnosis not present

## 2024-05-13 DIAGNOSIS — I6340 Cerebral infarction due to embolism of unspecified cerebral artery: Secondary | ICD-10-CM

## 2024-05-13 DIAGNOSIS — R233 Spontaneous ecchymoses: Secondary | ICD-10-CM

## 2024-05-13 LAB — CBC
HCT: 35.2 % — ABNORMAL LOW (ref 36.0–46.0)
Hemoglobin: 11.5 g/dL — ABNORMAL LOW (ref 12.0–15.0)
MCH: 27.8 pg (ref 26.0–34.0)
MCHC: 32.7 g/dL (ref 30.0–36.0)
MCV: 85.2 fL (ref 80.0–100.0)
Platelets: 439 K/uL — ABNORMAL HIGH (ref 150–400)
RBC: 4.13 MIL/uL (ref 3.87–5.11)
RDW: 12.9 % (ref 11.5–15.5)
WBC: 7.5 K/uL (ref 4.0–10.5)
nRBC: 0 % (ref 0.0–0.2)

## 2024-05-13 LAB — LIPID PANEL
Cholesterol: 124 mg/dL (ref 0–200)
HDL: 63 mg/dL (ref >40)
LDL Cholesterol: 47 mg/dL (ref 0–99)
Total CHOL/HDL Ratio: 2 ratio
Triglycerides: 75 mg/dL (ref <150)
VLDL: 15 mg/dL (ref 0–40)

## 2024-05-13 LAB — BASIC METABOLIC PANEL WITH GFR
Anion gap: 9 (ref 5–15)
BUN: 8 mg/dL (ref 8–23)
CO2: 27 mmol/L (ref 22–32)
Calcium: 8.6 mg/dL — ABNORMAL LOW (ref 8.9–10.3)
Chloride: 95 mmol/L — ABNORMAL LOW (ref 98–111)
Creatinine, Ser: 0.57 mg/dL (ref 0.44–1.00)
GFR, Estimated: >60 mL/min (ref >60)
Glucose, Bld: 97 mg/dL (ref 70–99)
Potassium: 3.6 mmol/L (ref 3.5–5.1)
Sodium: 131 mmol/L — ABNORMAL LOW (ref 135–145)

## 2024-05-13 LAB — OSMOLALITY: Osmolality: 275 mosm/kg (ref 275–295)

## 2024-05-13 LAB — HEMOGLOBIN A1C
Hgb A1c MFr Bld: 5.5 % (ref 4.8–5.6)
Mean Plasma Glucose: 111.15 mg/dL

## 2024-05-13 LAB — OSMOLALITY, URINE: Osmolality, Ur: 214 mosm/kg — ABNORMAL LOW (ref 300–900)

## 2024-05-13 LAB — VITAMIN B12: Vitamin B-12: 880 pg/mL (ref 180–914)

## 2024-05-13 MED ORDER — ENSURE PLUS HIGH PROTEIN PO LIQD
237.0000 mL | Freq: Two times a day (BID) | ORAL | Status: DC
Start: 2024-05-14 — End: 2024-05-20
  Administered 2024-05-14 – 2024-05-20 (×13): 237 mL via ORAL

## 2024-05-13 MED ORDER — ATORVASTATIN CALCIUM 10 MG PO TABS
10.0000 mg | ORAL_TABLET | Freq: Every day | ORAL | Status: DC
  Administered 2024-05-13 – 2024-05-19 (×7): 10 mg via ORAL
  Filled 2024-05-13 (×7): qty 1

## 2024-05-13 MED ORDER — BUPROPION HCL ER (XL) 150 MG PO TB24
150.0000 mg | ORAL_TABLET | Freq: Every day | ORAL | Status: DC
  Administered 2024-05-15 – 2024-05-19 (×2): 150 mg via ORAL
  Filled 2024-05-13 (×8): qty 1

## 2024-05-13 MED ORDER — FOLIC ACID 1 MG PO TABS
1000.0000 ug | ORAL_TABLET | Freq: Every day | ORAL | Status: DC
  Administered 2024-05-13 – 2024-05-20 (×8): 1 mg via ORAL
  Filled 2024-05-13 (×8): qty 1

## 2024-05-13 MED ORDER — BUSPIRONE HCL 15 MG PO TABS
7.5000 mg | ORAL_TABLET | Freq: Three times a day (TID) | ORAL | Status: DC
  Administered 2024-05-13 – 2024-05-20 (×22): 7.5 mg via ORAL
  Filled 2024-05-13 (×5): qty 1
  Filled 2024-05-13: qty 1.5
  Filled 2024-05-13 (×10): qty 1
  Filled 2024-05-13: qty 1.5
  Filled 2024-05-13 (×8): qty 1
  Filled 2024-05-13: qty 1.5

## 2024-05-13 MED ORDER — ONDANSETRON HCL 4 MG/2ML IJ SOLN
4.0000 mg | Freq: Four times a day (QID) | INTRAMUSCULAR | Status: DC | PRN
  Administered 2024-05-13 – 2024-05-15 (×2): 4 mg via INTRAVENOUS
  Filled 2024-05-13 (×2): qty 2

## 2024-05-13 MED ORDER — LACTATED RINGERS IV SOLN: INTRAVENOUS | Status: DC

## 2024-05-13 MED ORDER — SODIUM CHLORIDE 0.9 % IV SOLN: INTRAVENOUS | Status: AC

## 2024-05-13 MED ORDER — FERROUS SULFATE 325 (65 FE) MG PO TABS
325.0000 mg | ORAL_TABLET | Freq: Every day | ORAL | Status: DC
  Administered 2024-05-13 – 2024-05-20 (×8): 325 mg via ORAL
  Filled 2024-05-13 (×8): qty 1

## 2024-05-13 MED ORDER — LOSARTAN POTASSIUM 50 MG PO TABS
50.0000 mg | ORAL_TABLET | Freq: Every day | ORAL | Status: AC
Start: 2024-05-13 — End: ?
  Administered 2024-05-13 – 2024-05-18 (×6): 50 mg via ORAL
  Filled 2024-05-13 (×6): qty 1

## 2024-05-13 MED ORDER — VITAMIN D 25 MCG (1000 UNIT) PO TABS
5000.0000 [IU] | ORAL_TABLET | Freq: Every day | ORAL | Status: DC
  Administered 2024-05-13 – 2024-05-20 (×8): 5000 [IU] via ORAL
  Filled 2024-05-13 (×8): qty 5

## 2024-05-13 MED ORDER — ASPIRIN 81 MG PO TBEC
81.0000 mg | DELAYED_RELEASE_TABLET | Freq: Every day | ORAL | Status: AC
Start: 2024-05-14 — End: ?
  Administered 2024-05-14 – 2024-05-20 (×7): 81 mg via ORAL
  Filled 2024-05-13 (×7): qty 1

## 2024-05-13 NOTE — Progress Notes (Signed)
 VASCULAR LAB    Bilateral lower extremity venous duplex has been performed.  See CV proc for preliminary results.   Lineth Thielke, RVT 05/13/2024, 3:46 PM

## 2024-05-13 NOTE — Plan of Care (Signed)

## 2024-05-13 NOTE — Progress Notes (Signed)
 Patient arrived to unit via [facility] from [facility] ER. A/Ox4 on room with PIV infusing per MD orders. See chart for further detailed assessment. Call light within reach and bed locked in it;s lowest position. Care on-going at this time.

## 2024-05-13 NOTE — Progress Notes (Signed)
  Echocardiogram Attempted exam at 320pm, but PT was just starting with patient.  Michele Tyler 05/13/2024, 3:23 PM

## 2024-05-13 NOTE — Plan of Care (Signed)
 Problem: Education: Goal: Knowledge of disease or condition will improve 05/13/2024 1552 by Jenel Bobetta SAILOR, RN Outcome: Progressing 05/13/2024 1552 by Jenel Bobetta SAILOR, RN Outcome: Progressing Goal: Knowledge of secondary prevention will improve (MUST DOCUMENT ALL) 05/13/2024 1552 by Jenel Bobetta SAILOR, RN Outcome: Progressing 05/13/2024 1552 by Jenel Bobetta SAILOR, RN Outcome: Progressing Goal: Knowledge of patient specific risk factors will improve (DELETE if not current risk factor) 05/13/2024 1552 by Jenel Bobetta SAILOR, RN Outcome: Progressing 05/13/2024 1552 by Jenel Bobetta SAILOR, RN Outcome: Progressing   Problem: Ischemic Stroke/TIA Tissue Perfusion: Goal: Complications of ischemic stroke/TIA will be minimized 05/13/2024 1552 by Jenel Bobetta SAILOR, RN Outcome: Progressing 05/13/2024 1552 by Jenel Bobetta SAILOR, RN Outcome: Progressing   Problem: Coping: Goal: Will verbalize positive feelings about self 05/13/2024 1552 by Jenel Bobetta SAILOR, RN Outcome: Progressing 05/13/2024 1552 by Jenel Bobetta SAILOR, RN Outcome: Progressing Goal: Will identify appropriate support needs 05/13/2024 1552 by Jenel Bobetta SAILOR, RN Outcome: Progressing 05/13/2024 1552 by Jenel Bobetta SAILOR, RN Outcome: Progressing   Problem: Health Behavior/Discharge Planning: Goal: Ability to manage health-related needs will improve 05/13/2024 1552 by Jenel Bobetta SAILOR, RN Outcome: Progressing 05/13/2024 1552 by Jenel Bobetta SAILOR, RN Outcome: Progressing Goal: Goals will be collaboratively established with patient/family 05/13/2024 1552 by Jenel Bobetta SAILOR, RN Outcome: Progressing 05/13/2024 1552 by Jenel Bobetta SAILOR, RN Outcome: Progressing   Problem: Self-Care: Goal: Ability to participate in self-care as condition permits will improve 05/13/2024 1552 by Jenel Bobetta SAILOR, RN Outcome: Progressing 05/13/2024 1552 by Jenel Bobetta SAILOR, RN Outcome: Progressing Goal: Verbalization of feelings and concerns over difficulty with  self-care will improve 05/13/2024 1552 by Jenel Bobetta SAILOR, RN Outcome: Progressing 05/13/2024 1552 by Jenel Bobetta SAILOR, RN Outcome: Progressing Goal: Ability to communicate needs accurately will improve 05/13/2024 1552 by Jenel Bobetta SAILOR, RN Outcome: Progressing 05/13/2024 1552 by Jenel Bobetta SAILOR, RN Outcome: Progressing   Problem: Nutrition: Goal: Risk of aspiration will decrease 05/13/2024 1552 by Jenel Bobetta SAILOR, RN Outcome: Progressing 05/13/2024 1552 by Jenel Bobetta SAILOR, RN Outcome: Progressing Goal: Dietary intake will improve 05/13/2024 1552 by Jenel Bobetta SAILOR, RN Outcome: Progressing 05/13/2024 1552 by Jenel Bobetta SAILOR, RN Outcome: Progressing   Problem: Education: Goal: Knowledge of General Education information will improve Description: Including pain rating scale, medication(s)/side effects and non-pharmacologic comfort measures 05/13/2024 1552 by Jenel Bobetta SAILOR, RN Outcome: Progressing 05/13/2024 1552 by Jenel Bobetta SAILOR, RN Outcome: Progressing   Problem: Health Behavior/Discharge Planning: Goal: Ability to manage health-related needs will improve 05/13/2024 1552 by Jenel Bobetta SAILOR, RN Outcome: Progressing 05/13/2024 1552 by Jenel Bobetta SAILOR, RN Outcome: Progressing   Problem: Clinical Measurements: Goal: Ability to maintain clinical measurements within normal limits will improve 05/13/2024 1552 by Jenel Bobetta SAILOR, RN Outcome: Progressing 05/13/2024 1552 by Jenel Bobetta SAILOR, RN Outcome: Progressing Goal: Will remain free from infection 05/13/2024 1552 by Jenel Bobetta SAILOR, RN Outcome: Progressing 05/13/2024 1552 by Jenel Bobetta SAILOR, RN Outcome: Progressing Goal: Diagnostic test results will improve 05/13/2024 1552 by Jenel Bobetta SAILOR, RN Outcome: Progressing 05/13/2024 1552 by Jenel Bobetta SAILOR, RN Outcome: Progressing Goal: Respiratory complications will improve 05/13/2024 1552 by Jenel Bobetta SAILOR, RN Outcome: Progressing 05/13/2024 1552 by Jenel Bobetta SAILOR,  RN Outcome: Progressing Goal: Cardiovascular complication will be avoided 05/13/2024 1552 by Jenel Bobetta SAILOR, RN Outcome: Progressing 05/13/2024 1552 by Jenel Bobetta SAILOR, RN Outcome: Progressing   Problem: Activity: Goal: Risk for activity intolerance will decrease 05/13/2024 1552 by Jenel Bobetta SAILOR, RN Outcome: Progressing 05/13/2024 1552 by  Jenel Bobetta SAILOR, RN Outcome: Progressing   Problem: Nutrition: Goal: Adequate nutrition will be maintained 05/13/2024 1552 by Jenel Bobetta SAILOR, RN Outcome: Progressing 05/13/2024 1552 by Jenel Bobetta SAILOR, RN Outcome: Progressing   Problem: Coping: Goal: Level of anxiety will decrease 05/13/2024 1552 by Jenel Bobetta SAILOR, RN Outcome: Progressing 05/13/2024 1552 by Jenel Bobetta SAILOR, RN Outcome: Progressing   Problem: Elimination: Goal: Will not experience complications related to bowel motility 05/13/2024 1552 by Jenel Bobetta SAILOR, RN Outcome: Progressing 05/13/2024 1552 by Jenel Bobetta SAILOR, RN Outcome: Progressing Goal: Will not experience complications related to urinary retention 05/13/2024 1552 by Jenel Bobetta SAILOR, RN Outcome: Progressing 05/13/2024 1552 by Jenel Bobetta SAILOR, RN Outcome: Progressing   Problem: Pain Managment: Goal: General experience of comfort will improve and/or be controlled 05/13/2024 1552 by Jenel Bobetta SAILOR, RN Outcome: Progressing 05/13/2024 1552 by Jenel Bobetta SAILOR, RN Outcome: Progressing   Problem: Safety: Goal: Ability to remain free from injury will improve 05/13/2024 1552 by Jenel Bobetta SAILOR, RN Outcome: Progressing 05/13/2024 1552 by Jenel Bobetta SAILOR, RN Outcome: Progressing   Problem: Skin Integrity: Goal: Risk for impaired skin integrity will decrease 05/13/2024 1552 by Jenel Bobetta SAILOR, RN Outcome: Progressing 05/13/2024 1552 by Jenel Bobetta SAILOR, RN Outcome: Progressing

## 2024-05-13 NOTE — Evaluation (Signed)
 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   Dizziness 05/11/2024   Nausea 04/12/2024   Hyponatremia 01/06/2024   Urinary and fecal incontinence 11/27/2023   Itchy scalp 09/05/2023   Iron deficiency 07/04/2019   B12 deficiency 07/04/2019   Lipoma 07/04/2019   Fear of other medical care- signficant fears of drug S-E inhibiting pt's ability to take medications 07/02/2019   Insomnia 03/25/2018   Hyperlipidemia 03/25/2018   Hypertension, goal below 150/90 02/18/2018   Mood disorder 02/18/2018   Generalized anxiety disorder 03/19/2013   Past Medical History:  Past Medical History:  Diagnosis Date   Anxiety    Depression    Hypertension    Past Surgical History:  Past Surgical History:  Procedure Laterality Date   DILATION AND CURETTAGE OF UTERUS  2002   FACELIFT  2005   HPI:  78yo female admitted 05/12/24 with weakness,  incoordination and dizziness. PMH: HTN, HLD. MRI: early subacute infarct superior right cerebellum right ACA territory, addt'd scattered early subacute infarcts R>L cerebral hemisphere. Pending transfer to [facility]   Assessment / Plan / Recommendation Clinical Impression  Pt seen for cognitive linguistic evaluation. She is fully awake, alert, pleasant and cooperative. Pt reports she lives alone, and was fully independent prior to admit. 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    She presents with fully intelligible speech, intact receptive and expressive language. The [employer] Mental Status (SLUMS) was administered. Pt scored 21/30, with difficulty noted on mental math, delayed recall (4/5), digit reversal (1/2), and clock drawing (executive  functions).   Recommend continued ST intervention after discharge via HH or OP given prior level of function and independence. No further acute care ST intervention recommended at this time.    SLP Assessment  SLP Recommendation/Assessment: All further Speech Language Pathology needs can be addressed in the next venue of care SLP Visit Diagnosis: Cognitive communication deficit (R41.841)     Assistance Recommended at Discharge  Intermittent Supervision/Assistance  Functional Status Assessment Patient has had a recent decline in their functional status and demonstrates the ability to make significant improvements in function in a reasonable and predictable amount of time.  Frequency and Duration          SLP Evaluation Cognition  Overall Cognitive Status: Impaired/Different from baseline Arousal/Alertness: Awake/alert       Comprehension  Auditory Comprehension Overall Auditory Comprehension: Appears within functional limits for tasks assessed Reading Comprehension Reading Status: Within funtional limits    Expression Expression Primary Mode of Expression: Verbal Verbal Expression Overall Verbal Expression: Appears within functional limits for tasks assessed Written Expression Dominant Hand: Left   Oral / Motor  Oral Motor/Sensory Function Overall Oral Motor/Sensory Function: Within functional limits Motor Speech Overall Motor Speech: Appears within functional limits for tasks assessed Articulation: Within functional limitis Intelligibility: Intelligible           Evangelynn Lochridge B. Dory, MSP, CCC-SLP Speech Language Pathologist  Dory Caprice Daring 05/13/2024, 11:56 AM

## 2024-05-13 NOTE — Progress Notes (Signed)
 PROGRESS NOTE    Michele Tyler  FMW:[id number] DOB: 03-29-1946 DOA: 05/12/2024 PCP: [name], MD   Brief Narrative: 78 year old with past medical history significant for hypertension, hyperlipidemia presents with 5 days of dizziness, unsteadiness she was found to have subacute stroke.  She will be transferred to [facility] for further stroke evaluation.   Assessment & Plan:   Principal Problem:   Cerebellar stroke [facility]) Active Problems:   Hyperlipidemia   Iron deficiency   1-Subacute stroke - Presented with dizziness, unsteady gait. -Started on aspirin and Plavix.  Follow further neurology recommendation -Started on Lipitor. -Echo ordered -PT, OT and speech evaluation -LDL 47, A1c 5.5 -Noted to have some cognitive deficits, will check B12. -Continue to monitor on telemetry  Hyponatremia, Appears to be chronic.  Could be related to hydrochlorothiazide. Continue to hold hydrochlorothiazide   Thrombocytosis: Reactive monitor  Hypertension: Hold hydrochlorothiazide due to hyponatremia.  Per neurology, no need for permissive hypertension.  Will resume lower home dose losartan .  Adjust as needed.  MDD/GAD: Continue Wellbutrin  and BuSpar   IDA: Continue iron supplementation    Estimated body mass index is 19.22 kg/m as calculated from the following:   Height as of an earlier encounter on 05/12/24: 5' 4 (1.626 m).   Weight as of an earlier encounter on 05/12/24: 50.8 kg.   DVT prophylaxis: Lovenox Code Status: Full code Family Communication: Care discussed with patient Disposition Plan:  Status is: Inpatient Remains inpatient appropriate because: Management of stroke    Consultants:  Neurology  Procedures:  Echo  Antimicrobials:    Subjective: She is alert and conversant, she reports dizziness and unsteady gait on ambulation for the last 5 to 6 days.  Objective: Vitals:   05/12/24 2349 05/13/24 0245 05/13/24 0300 05/13/24 0436  BP:  (!)  159/81 135/76 (!) 170/86  Pulse:  71 71 81  Resp:  (!) 23 (!) 26 18  Temp: 97.8 F (36.6 C) 98 F (36.7 C)  97.8 F (36.6 C)  TempSrc: Oral Oral  Oral  SpO2:  99% 98% 99%   No intake or output data in the 24 hours ending 05/13/24 0820 There were no vitals filed for this visit.  Examination:  General exam: Appears calm and comfortable  Respiratory system: Clear to auscultation. Respiratory effort normal. Cardiovascular system: S1 & S2 heard, RRR. No JVD, murmurs, rubs, gallops or clicks. No pedal edema. Gastrointestinal system: Abdomen is nondistended, soft and nontender. No organomegaly or masses felt. Normal bowel sounds heard. Central nervous system: Alert and oriented.  Motor strength 5/5 speech clear, follows, Extremities: Symmetric 5 x 5 power. Skin: No rashes, lesions or ulcers     Data Reviewed: I have personally reviewed following labs and imaging studies  CBC: Recent Labs  Lab 05/10/24 1432 05/12/24 1420 05/13/24 0533  WBC 9.0 9.4 7.5  NEUTROABS 7.6* 8.2*  --   HGB 12.5 12.7 11.5*  HCT 38.2 37.7 35.2*  MCV 89 84.9 85.2  PLT 497* 486* 439*   Basic Metabolic Panel: Recent Labs  Lab 05/10/24 1432 05/12/24 1420 05/13/24 0533  NA 131* 130* 131*  K 4.6 4.0 3.6  CL 91* 91* 95*  CO2 26 27 27   GLUCOSE 91 91 97  BUN 12 9 8   CREATININE 0.69 0.64 0.57  CALCIUM  9.8 9.5 8.6*   GFR: Estimated Creatinine Clearance: 48 mL/min (by C-G formula based on SCr of 0.57 mg/dL). Liver Function Tests: Recent Labs  Lab 05/10/24 1432 05/12/24 1420  AST 36 44*  ALT 19 18  ALKPHOS 98 94  BILITOT 0.3 0.4  PROT 6.5 7.2  ALBUMIN 4.3 4.5   No results for input(s): LIPASE, AMYLASE in the last 168 hours. No results for input(s): AMMONIA in the last 168 hours. Coagulation Profile: No results for input(s): INR, PROTIME in the last 168 hours. Cardiac Enzymes: No results for input(s): CKTOTAL, CKMB, CKMBINDEX, TROPONINI in the last 168 hours. BNP (last 3  results) No results for input(s): PROBNP in the last 8760 hours. HbA1C: Recent Labs    05/12/24 1420  HGBA1C 5.5   CBG: No results for input(s): GLUCAP in the last 168 hours. Lipid Profile: Recent Labs    05/13/24 0533  CHOL 124  HDL 63  LDLCALC 47  TRIG 75  CHOLHDL 2.0   Thyroid  Function Tests: Recent Labs    05/10/24 1432  TSH 0.957   Anemia Panel: No results for input(s): VITAMINB12, FOLATE, FERRITIN, TIBC, IRON, RETICCTPCT in the last 72 hours. Sepsis Labs: No results for input(s): PROCALCITON, LATICACIDVEN in the last 168 hours.  No results found for this or any previous visit (from the past 240 hours).       Radiology Studies: CT ANGIO HEAD NECK W WO CM Result Date: 05/12/2024 EXAM: CTA HEAD AND NECK WITHOUT AND WITH 05/12/2024 07:25:25 PM TECHNIQUE: CTA of the head and neck was performed without and with the administration of 75 mL of iohexol (OMNIPAQUE) 350 MG/ML injection. Multiplanar 2D and/or 3D reformatted images are provided for review. Automated exposure control, iterative reconstruction, and/or weight based adjustment of the mA/kV was utilized to reduce the radiation dose to as low as reasonably achievable. Stenosis of the internal carotid arteries measured using NASCET criteria. COMPARISON: Comparison with prior CT and MRI from earlier the same day. CLINICAL HISTORY: Stroke/TIA, determine embolic source. Pt presents today due to dizziness for the past 6 days. Pt was seen by her PCP 2 days ago and they performed blood work that was found to be unremarkable. Pt states that she is still experiencing dizziness, states that she feels when she stands from a sitting position it takes her a minute to catch her balance, pt denies room spinning. FINDINGS: LIMITATIONS: Examination degraded by motion artifact. CTA NECK: AORTIC ARCH AND ARCH VESSELS: Mild aortic atherosclerosis. No dissection or arterial injury. No significant stenosis of the  brachiocephalic or subclavian arteries. CERVICAL CAROTID ARTERIES: No dissection, arterial injury, or hemodynamically significant stenosis by NASCET criteria. CERVICAL VERTEBRAL ARTERIES: Right vertebral artery dominant. No dissection, arterial injury, or significant stenosis. LUNGS AND MEDIASTINUM: Moderate layering bilateral pleural effusions, left larger than right. Mild pulmonary interstitial congestion within the visualized aerated portions of the lungs. SOFT TISSUES: No acute abnormality. BONES: Grade 1 degenerative anterolisthesis of C4 on C5. Moderate cervical spondylosis at C5-C6 and C6-C7. CTA HEAD: ANTERIOR CIRCULATION: No significant stenosis of the internal carotid arteries. No significant stenosis of the anterior cerebral arteries. No significant stenosis of the middle cerebral arteries. No aneurysm. POSTERIOR CIRCULATION: Right PCA supplied via the basilar as well as a prominent right posterior carotid artery. Fetal type origin of the left PCA. No significant stenosis of the posterior cerebral arteries. No significant stenosis of the basilar artery. No significant stenosis of the vertebral arteries. No aneurysm. OTHER: No dural venous sinus thrombosis on this non-dedicated study. IMPRESSION: 1. Negative CTA with no large vessel occlusion or other emergent findings. Mild premature atheromatous disease without hemodynamically significant or correctable stenosis. 2. Moderate layering pleural effusions, left greater than right, with mild  pulmonary interstitial congestion within the visualized lungs. Electronically signed by: Morene Hoard MD 05/12/2024 07:46 PM EDT RP Workstation: [id number]   MR BRAIN WO CONTRAST Result Date: 05/12/2024 EXAM: MRI BRAIN WITHOUT CONTRAST 05/12/2024 05:53:55 PM TECHNIQUE: Multiplanar multisequence MRI of the head/brain was performed without the administration of intravenous contrast. COMPARISON: Comparison with prior head CT from earlier the same day. CLINICAL  HISTORY: FINDINGS: BRAIN AND VENTRICLES: No midline shift. No hydrocephalus. Patchy and confluent T2 FLAIR signal abnormality involving the periventricular and deep white matter of both cerebral hemispheres as well as the pons, consistent with chronic subvascular ischemic disease, advanced in nature. Few scattered remote lacunar infarcts about the hemispheric cerebral white matter. Few scattered remote cerebellar infarcts noted. Early subacute ischemic infarct involving the superior right cerebellum, right SCA distribution (series 5, image 13). Area of infarction measures up to 4.2 cm. Multiple additional scattered early subacute infarcts seen involving the right greater than left cortical to subcortical cerebral hemispheres. Few scattered foci of associated petechial blood products noted. No significant regional mass effect. Few scattered chronic microhemorrhages noted, likely small vessel hypertensive in nature. The sella is unremarkable. Normal flow voids. ORBITS: No acute abnormality. SINUSES AND MASTOIDS: No acute abnormality. BONES AND SOFT TISSUES: Normal marrow signal. 1.9 cm nodule lesion present at the left parietal scalp (series 8, image 25), indeterminate. IMPRESSION: 1. Early subacute infarct of the superior right cerebellum in the right SCA territory, with additional scattered early subacute infarcts involving the right greater than left cerebral hemispheres. Mild associated petechial blood products without hemorrhagic transformation or significant regional mass effect. 2. Underlying advanced chronic small vessel ischemic disease with scattered remote lacunar and cerebellar infarcts. Electronically signed by: Morene Hoard MD 05/12/2024 06:09 PM EDT RP Workstation: [id number]   CT Head Wo Contrast Result Date: 05/12/2024 EXAM: CT HEAD WITHOUT CONTRAST 05/12/2024 01:56:16 PM TECHNIQUE: CT of the head was performed without the administration of intravenous contrast. Automated exposure control,  iterative reconstruction, and/or weight based adjustment of the mA/kV was utilized to reduce the radiation dose to as low as reasonably achievable. COMPARISON: None available. CLINICAL HISTORY: Vertigo, peripheral. Pt BIB EMS from urgent care due to Hyponatremia. Pt c/o dizziness and weakness since Friday. FINDINGS: BRAIN AND VENTRICLES: No acute hemorrhage. No evidence of acute infarct. Old right cerebellar infarct. Subacute to chronic right superior cerebellar infarct. Remote infarct in the right occipital lobe and additional remote infarcts in the posterior lateral right temporal lobe. Small remote infarcts in the left cerebellum. There is likely an additional remote infarct in the left thalamus. Atherosclerosis of the carotid siphons. Moderate chronic microvascular ischemic change. No hydrocephalus. No extra-axial collection. No mass effect or midline shift. ORBITS: No acute abnormality. SINUSES: No acute abnormality. SOFT TISSUES AND SKULL: No acute soft tissue abnormality. No skull fracture. IMPRESSION: 1. Possible subacute infarct in the superior right cerebellum. Recommend MRI for further evaluation. 2. Additional remote infarcts in the right cerebellum, right occipital lobe, and posterolateral right temporal lobe. Small remote infarcts in the left cerebellum. Likely additional remote infarct in the left thalamus. 3. Moderate chronic microvascular ischemic change. Electronically signed by: Donnice Mania MD 05/12/2024 02:16 PM EDT RP Workstation: [id number]   DG Chest Port 1 View Result Date: 05/12/2024 CLINICAL DATA:  Dizziness and weakness. EXAM: PORTABLE CHEST 1 VIEW COMPARISON:  May 12, 2024 FINDINGS: The heart size and mediastinal contours are within normal limits. Mild, stable atelectasis and/or infiltrate is seen within the left lung base. Very mild atelectatic changes are also seen  within the lateral aspect of the right lung base. There is a small, stable left pleural effusion. No pneumothorax  is identified. The visualized skeletal structures are unremarkable. IMPRESSION: 1. Mild, stable left basilar atelectasis and/or infiltrate. 2. Very mild right basilar atelectasis. 3. Small, stable left pleural effusion. Electronically Signed   By: Suzen Dials M.D.   On: 05/12/2024 14:06   DG Chest 2 View Result Date: 05/12/2024 EXAM: 2 VIEW(S) XRAY OF THE CHEST 05/12/2024 11:07:25 AM COMPARISON: None available. CLINICAL HISTORY: dizziness. Pt c/o dizziness and weakness Onset 6 days ago Also st's her blood glucose is low FINDINGS: LUNGS AND PLEURA: Small left pleural effusion. Left basilar atelectasis. No focal pulmonary opacity. No pulmonary edema. No pneumothorax. HEART AND MEDIASTINUM: Calcified aorta. No acute abnormality of the cardiac and mediastinal silhouettes. BONES AND SOFT TISSUES: No acute osseous abnormality. IMPRESSION: 1. Small left pleural effusion with left basilar atelectasis. Electronically signed by: Oneil Devonshire MD 05/12/2024 11:29 AM EDT RP Workstation: [id number]        Scheduled Meds:   stroke: early stages of recovery book   Does not apply Once   aspirin  300 mg Rectal Daily   Or   aspirin  325 mg Oral Daily   atorvastatin   10 mg Oral QHS   buPROPion   150 mg Oral Daily   busPIRone   7.5 mg Oral TID   cholecalciferol  5,000 Units Oral Q breakfast   clopidogrel  75 mg Oral Daily   enoxaparin (LOVENOX) injection  40 mg Subcutaneous Q24H   ferrous sulfate  325 mg Oral Q breakfast   folic acid   1,000 mcg Oral Daily   Continuous Infusions:   LOS: 1 day    Time spent: 35 minutes     Atif Chapple A Lavante Toso, MD [facility]   If 7PM-7AM, please contact night-coverage [email or web address]  05/13/2024, 8:20 AM

## 2024-05-13 NOTE — ED Notes (Signed)
 Speech therapy at bedside.

## 2024-05-13 NOTE — Progress Notes (Signed)
 PT Cancellation Note  Patient Details Name: Michele Tyler MRN: [id number] DOB: 10-31-1945   Cancelled Treatment:    Reason Eval/Treat Not Completed: ECHO at bedside. Acute PT to follow and re-attempt as schedule allows.  Kate ORN, PT, DPT Secure Chat Preferred  Rehab Office [phone]   Kate BRAVO [name] 05/13/2024, 4:36 PM

## 2024-05-13 NOTE — Consult Note (Signed)
 NEUROLOGY CONSULT NOTE   Date of service: May 13, 2024 Patient Name: Yarlin Breisch MRN:  [id number] DOB:  06/11/1946 Chief Complaint: Incoordination and dizziness Requesting Provider: Fernand Prost, MD  History of Present Illness  Ensley Blas is a 78 y.o. female with hx of hypertension, hyperlipidemia presenting to the emergency department at [address] after being feeling dizzy and incoordinated as well as generally weak since Friday of last week.  Last known well was sometime Friday, 05/07/2024 when she was in her usual state of health.  Sometime around that same day, she started feeling dizzy-she describes things and room spinning sensation.  Symptoms did not improve which made her come to the ED. MRI of the brain was completed that shows early subacute infarct of the superior right cerebellum in the right ACA territory, additional scattered early subacute infarcts involving right greater than left cerebral hemisphere.  Mild associated petechial blood products without hemorrhagic transformation or significant regional mass effect.  Chronic small vessel disease. Neurology was consulted Stroke risk factor workup was ordered early last night in anticipation for her being transferred to [facility], but she is currently still at [facility] as of 5 AM and was seen at [facility] pending transfer to [facility] for stroke team follow-up.  LKW: Sometime on 05/07/2024 Modified rankin score: 0-Completely asymptomatic and back to baseline post- stroke IV Thrombolysis: Outside the window EVT: Outside the window  NIHSS components Score: Comment  1a Level of Conscious 0[x]  1[]  2[]  3[]      1b LOC Questions 0[x]  1[]  2[]       1c LOC Commands 0[x]  1[]  2[]       2 Best Gaze 0[x]  1[]  2[]       3 Visual 0[x]  1[]  2[]  3[]      4 Facial Palsy 0[]  1[x]  2[]  3[]      5a Motor Arm - left 0[x]  1[]  2[]  3[]  4[]  UN[]    5b Motor Arm - Right 0[]  1[x]  2[]  3[]  4[]  UN[]     6a Motor Leg - Left 0[x]  1[]  2[]  3[]  4[]  UN[]    6b Motor Leg - Right 0[x]  1[]  2[]  3[]  4[]  UN[]    7 Limb Ataxia 0[]  1[]  2[x]  UN[]      8 Sensory 0[x]  1[]  2[]  UN[]      9 Best Language 0[x]  1[]  2[]  3[]      10 Dysarthria 0[x]  1[]  2[]  UN[]      11 Extinct. and Inattention 0[x]  1[]  2[]       TOTAL: 4      ROS  Comprehensive ROS performed and pertinent positives documented in HPI   Past History   Past Medical History:  Diagnosis Date   Anxiety    Depression    Hypertension     Past Surgical History:  Procedure Laterality Date   DILATION AND CURETTAGE OF UTERUS  2002   FACELIFT  2005    Family History: Family History  Problem Relation Age of Onset   Hypertension Mother    Depression Father    Hypertension Father     Social History  reports that she quit smoking about 37 years ago. Her smoking use included cigarettes. She started smoking about 47 years ago. She has a 10 pack-year smoking history. She has never used smokeless tobacco. She reports current alcohol use of about 1.0 - 2.0 standard drink of alcohol per week. She reports that she does not use drugs.  Allergies  Allergen Reactions   Codeine Nausea Only    Medications  Current Facility-Administered Medications:     stroke: early stages of recovery book, , Does not apply, Once, Lindsey Hommel, MD   acetaminophen (TYLENOL) tablet 650 mg, 650 mg, Oral, Q6H PRN **OR** acetaminophen (TYLENOL) suppository 650 mg, 650 mg, Rectal, Q6H PRN, Khan, Ghalib, MD   aspirin suppository 300 mg, 300 mg, Rectal, Daily **OR** aspirin tablet 325 mg, 325 mg, Oral, Daily, Custer Pimenta, MD, 325 mg at 05/12/24 1950   buPROPion  (WELLBUTRIN  XL) 24 hr tablet 150 mg, 150 mg, Oral, Daily, Fernand Prost, MD   busPIRone  (BUSPAR ) tablet 7.5 mg, 7.5 mg, Oral, TID, Fernand Prost, MD, 7.5 mg at 05/13/24 0151   cholecalciferol (VITAMIN D3) 25 MCG (1000 UNIT) tablet 5,000 Units, 5,000 Units, Oral, Q breakfast, Fernand Prost, MD   clopidogrel (PLAVIX)  tablet 75 mg, 75 mg, Oral, Daily, India Jolin, MD, 75 mg at 05/12/24 1950   enoxaparin (LOVENOX) injection 40 mg, 40 mg, Subcutaneous, Q24H, Khan, Ghalib, MD, 40 mg at 05/12/24 2226   ferrous sulfate tablet 325 mg, 325 mg, Oral, Q breakfast, Fernand Prost, MD   folic acid  (FOLVITE ) tablet 1 mg, 1,000 mcg, Oral, Daily, Khan, Ghalib, MD   rosuvastatin (CRESTOR) tablet 40 mg, 40 mg, Oral, Daily, Khan, Ghalib, MD, 40 mg at 05/12/24 2225   senna-docusate (Senokot-S) tablet 1 tablet, 1 tablet, Oral, QHS PRN, Fernand Prost, MD  Current Outpatient Medications:    Apple Cider Vinegar 600 MG CAPS, Take 600 mg by mouth daily with breakfast., Disp: , Rfl:    Ascorbic Acid (VITAMIN C CR) 1500 MG TBCR, Take 1,500 mg by mouth daily., Disp: , Rfl:    atorvastatin  (LIPITOR) 10 MG tablet, TAKE 1 TABLET BY MOUTH AT  BEDTIME, Disp: 100 tablet, Rfl: 2   busPIRone  (BUSPAR ) 7.5 MG tablet, TAKE 1 TABLET BY MOUTH 3 TIMES  DAILY (Patient taking differently: Take 7.5 mg by mouth 3 (three) times daily as needed (for anxiety).), Disp: 300 tablet, Rfl: 0   CALCIUM  MAGNESIUM ZINC PO, Take 1 tablet by mouth in the morning., Disp: , Rfl:    Cholecalciferol (VITAMIN D3) 5000 units CAPS, Take 5,000 Units by mouth daily with breakfast., Disp: , Rfl:    Cyanocobalamin (B-12) 1000 MCG CAPS, Take 1,000 mcg by mouth in the morning., Disp: , Rfl:    Ferrous Sulfate (IRON) 325 (65 Fe) MG TABS, Take 325 mg by mouth daily with breakfast., Disp: , Rfl:    folic acid  (FOLVITE ) 800 MCG tablet, Take 800 mcg by mouth daily., Disp: , Rfl:    losartan -hydrochlorothiazide (HYZAAR) 100-25 MG tablet, Take 0.5 tablets by mouth at bedtime., Disp: , Rfl:    Omega-3 Fatty Acids (OMEGA-3 FISH OIL PO), Take 4,000 mg by mouth daily., Disp: , Rfl:    Soy Isoflavone 750 MG CAPS, Take 750 mg by mouth daily., Disp: , Rfl:    TURMERIC PO, Take 800 mg by mouth daily., Disp: , Rfl:    vitamin A 8000 UNIT capsule, Take 8,000 Units by mouth daily., Disp: , Rfl:     VITAMIN E-1000 PO, Take 1,000 Units by mouth daily., Disp: , Rfl:    Vitamins-Lipotropics (BALANCED B-150 COMPLEX TR PO), Take 1 tablet by mouth See admin instructions. Take 1 tablet by mouth once a day (contains b-1 @ 150 mg, b-2 150 mg, b-6 150 mg, b-12 @ 150 mg, and niacin 150 mg), Disp: , Rfl:    B Complex-Folic Acid  (BALANCED B-150 PO), Take 1 capsule by mouth daily., Disp: , Rfl:  buPROPion  (WELLBUTRIN  XL) 150 MG 24 hr tablet, TAKE 1 TABLET BY MOUTH DAILY (Patient not taking: Reported on 05/12/2024), Disp: 30 tablet, Rfl: 11   olmesartan  (BENICAR ) 20 MG tablet, TAKE 1 TABLET BY MOUTH DAILY (Patient not taking: Reported on 05/12/2024), Disp: 100 tablet, Rfl: 3   ondansetron  (ZOFRAN ) 4 MG tablet, Take 1 tablet (4 mg total) by mouth every 8 (eight) hours as needed for nausea or vomiting. (Patient not taking: Reported on 05/12/2024), Disp: 20 tablet, Rfl: 0  Vitals   Vitals:   05/12/24 2349 05/13/24 0245 05/13/24 0300 05/13/24 0436  BP:  (!) 159/81 135/76 (!) 170/86  Pulse:  71 71 81  Resp:  (!) 23 (!) 26 18  Temp: 97.8 F (36.6 C) 98 F (36.7 C)  97.8 F (36.6 C)  TempSrc: Oral Oral  Oral  SpO2:  99% 98% 99%    There is no height or weight on file to calculate BMI.   Physical Exam   Constitutional: Appears well-developed and well-nourished.  Psych: Affect appropriate to situation.  Eyes: No scleral injection.  HENT: No OP obstruction.  Head: Normocephalic.  Cardiovascular: Normal rate and regular rhythm.  Respiratory: Effort normal, non-labored breathing.  GI: Soft.  No distension. There is no tenderness.  Skin: WDI.   Neurologic Examination  She is awake alert oriented x 3.  No dysarthria.  No aphasia Cranial nerves II to XII: Pupils equal round react light, extraocular movements intact, visual fields full, facial sensation intact, face appears mildly asymmetric with right lower facial weakness at rest.  Auditory acuity intact.  Tongue and palate midline. Motor  examination reveals mild drift in the right upper extremity.  No drift in any of the other extremities. Sensory exam is intact to light touch without extinction bilaterally Coordination examination reveals significant ataxia in the right finger-to-nose and right heel-knee-shin testing.  Labs/Imaging/Neurodiagnostic studies   CBC:  Recent Labs  Lab 04-Jun-2024 1432 05/12/24 1420  WBC 9.0 9.4  NEUTROABS 7.6* 8.2*  HGB 12.5 12.7  HCT 38.2 37.7  MCV 89 84.9  PLT 497* 486*   Basic Metabolic Panel:  Lab Results  Component Value Date   NA 130 (L) 05/12/2024   K 4.0 05/12/2024   CO2 27 05/12/2024   GLUCOSE 91 05/12/2024   BUN 9 05/12/2024   CREATININE 0.64 05/12/2024   CALCIUM  9.5 05/12/2024   GFRNONAA >60 05/12/2024   GFRAA 106 07/05/2020   Lipid Panel:  Lab Results  Component Value Date   LDLCALC 71 12/24/2023   HgbA1c:  Lab Results  Component Value Date   HGBA1C 5.5 12/24/2023    Imaging personally reviewed MRI brain without contrast shows early subacute infarct in the right SCA territory, additional scattered early subacute infarcts in the right greater than left cerebral hemispheres.  Mild associated petechial blood products without hemorrhagic transformation or significant mass effect. CT angiography head and neck: Negative CTA with no ELVO.  Mild premature atheromatous disease without hemodynamically significant or correctable stenosis.  Moderate layering pleural effusions left greater than right with mild pulmonary interstitial congestion within the visualized lungs.  ASSESSMENT   Hadar Elgersma is a 78 y.o. female past medical history of hypertension hyperlipidemia presenting for evaluation of dizziness and coordination and weakness found to have subacute infarcts in the right cerebellum as well as early subacute infarcts in the right greater than left cerebral hemispheres. There is mild associated petechial blood products without hemorrhagic transformation or  significant mass effect She is at least 6  days out of her last known well. Given the appearance of strokes, cardioembolic etiology is suspected  Impression: Acute ischemic stroke-suspect cardioembolic etiology-further workup needed  RECOMMENDATIONS  Admit to hospitalist Frequent rechecks Telemetry Aspirin 81 and Plavix 75 High intensity statin for LDL goal less than 70 2D echo-May need TEE given high suspicion for cardioembolic source if all the workup is [facility] team to follow and advise. No need for permissive hypertension-normotension with avoidance of hypotension advised. Therapy assessments Stroke team to follow. Preliminary plan was discussed with the ED provider Dr. Cottie over the phone  ______________________________________________________________________    Signed, Eligio Lav, MD [facility]

## 2024-05-13 NOTE — Progress Notes (Signed)
 Echocardiogram 2D Echocardiogram has been performed.  Michele Tyler 05/13/2024, 5:19 PM

## 2024-05-13 NOTE — Progress Notes (Signed)
 STROKE TEAM PROGRESS NOTE   SUBJECTIVE (INTERVAL HISTORY) Her friend is at the bedside.  Overall her condition is gradually improving. She denies any dizziness anymore, able to eat breakfast and lunch. However, still has R sided ataxia. Discussed about heart monitoring, she prefers Zio patch.    OBJECTIVE Temp:  [97.3 F (36.3 C)-98.6 F (37 C)] 98.6 F (37 C) (10/16 1605) Pulse Rate:  [71-106] 87 (10/16 1605) Cardiac Rhythm: Normal sinus rhythm (10/16 1506) Resp:  [18-29] 18 (10/16 1605) BP: (135-180)/(56-98) 152/89 (10/16 1605) SpO2:  [93 %-100 %] 93 % (10/16 1605) Weight:  [50.8 kg] 50.8 kg (10/16 1605)  No results for input(s): GLUCAP in the last 168 hours. Recent Labs  Lab 05/10/24 1432 05/12/24 1420 05/13/24 0533  NA 131* 130* 131*  K 4.6 4.0 3.6  CL 91* 91* 95*  CO2 26 27 27   GLUCOSE 91 91 97  BUN 12 9 8   CREATININE 0.69 0.64 0.57  CALCIUM  9.8 9.5 8.6*   Recent Labs  Lab 05/10/24 1432 05/12/24 1420  AST 36 44*  ALT 19 18  ALKPHOS 98 94  BILITOT 0.3 0.4  PROT 6.5 7.2  ALBUMIN 4.3 4.5   Recent Labs  Lab 05/10/24 1432 05/12/24 1420 05/13/24 0533  WBC 9.0 9.4 7.5  NEUTROABS 7.6* 8.2*  --   HGB 12.5 12.7 11.5*  HCT 38.2 37.7 35.2*  MCV 89 84.9 85.2  PLT 497* 486* 439*   No results for input(s): CKTOTAL, CKMB, CKMBINDEX, TROPONINI in the last 168 hours. No results for input(s): LABPROT, INR in the last 72 hours. Recent Labs    05/12/24 1604  COLORURINE STRAW*  LABSPEC 1.004*  PHURINE 7.0  GLUCOSEU NEGATIVE  HGBUR NEGATIVE  BILIRUBINUR NEGATIVE  KETONESUR 5*  PROTEINUR NEGATIVE  NITRITE NEGATIVE  LEUKOCYTESUR NEGATIVE       Component Value Date/Time   CHOL 124 05/13/2024 0533   CHOL 160 12/24/2023 0949   TRIG 75 05/13/2024 0533   HDL 63 05/13/2024 0533   HDL 75 12/24/2023 0949   CHOLHDL 2.0 05/13/2024 0533   VLDL 15 05/13/2024 0533   LDLCALC 47 05/13/2024 0533   LDLCALC 71 12/24/2023 0949   Lab Results  Component Value  Date   HGBA1C 5.5 05/12/2024   No results found for: LABOPIA, COCAINSCRNUR, LABBENZ, AMPHETMU, THCU, LABBARB  No results for input(s): ETH in the last 168 hours.  I have personally reviewed the radiological images below and agree with the radiology interpretations.  CT ANGIO HEAD NECK W WO CM Result Date: 05/12/2024 EXAM: CTA HEAD AND NECK WITHOUT AND WITH 05/12/2024 07:25:25 PM TECHNIQUE: CTA of the head and neck was performed without and with the administration of 75 mL of iohexol (OMNIPAQUE) 350 MG/ML injection. Multiplanar 2D and/or 3D reformatted images are provided for review. Automated exposure control, iterative reconstruction, and/or weight based adjustment of the mA/kV was utilized to reduce the radiation dose to as low as reasonably achievable. Stenosis of the internal carotid arteries measured using NASCET criteria. COMPARISON: Comparison with prior CT and MRI from earlier the same day. CLINICAL HISTORY: Stroke/TIA, determine embolic source. Pt presents today due to dizziness for the past 6 days. Pt was seen by her PCP 2 days ago and they performed blood work that was found to be unremarkable. Pt states that she is still experiencing dizziness, states that she feels when she stands from a sitting position it takes her a minute to catch her balance, pt denies room spinning. FINDINGS: LIMITATIONS: Examination degraded  by motion artifact. CTA NECK: AORTIC ARCH AND ARCH VESSELS: Mild aortic atherosclerosis. No dissection or arterial injury. No significant stenosis of the brachiocephalic or subclavian arteries. CERVICAL CAROTID ARTERIES: No dissection, arterial injury, or hemodynamically significant stenosis by NASCET criteria. CERVICAL VERTEBRAL ARTERIES: Right vertebral artery dominant. No dissection, arterial injury, or significant stenosis. LUNGS AND MEDIASTINUM: Moderate layering bilateral pleural effusions, left larger than right. Mild pulmonary interstitial congestion within the  visualized aerated portions of the lungs. SOFT TISSUES: No acute abnormality. BONES: Grade 1 degenerative anterolisthesis of C4 on C5. Moderate cervical spondylosis at C5-C6 and C6-C7. CTA HEAD: ANTERIOR CIRCULATION: No significant stenosis of the internal carotid arteries. No significant stenosis of the anterior cerebral arteries. No significant stenosis of the middle cerebral arteries. No aneurysm. POSTERIOR CIRCULATION: Right PCA supplied via the basilar as well as a prominent right posterior carotid artery. Fetal type origin of the left PCA. No significant stenosis of the posterior cerebral arteries. No significant stenosis of the basilar artery. No significant stenosis of the vertebral arteries. No aneurysm. OTHER: No dural venous sinus thrombosis on this non-dedicated study. IMPRESSION: 1. Negative CTA with no large vessel occlusion or other emergent findings. Mild premature atheromatous disease without hemodynamically significant or correctable stenosis. 2. Moderate layering pleural effusions, left greater than right, with mild pulmonary interstitial congestion within the visualized lungs. Electronically signed by: Morene Hoard MD 05/12/2024 07:46 PM EDT RP Workstation: [id number]   MR BRAIN WO CONTRAST Result Date: 05/12/2024 EXAM: MRI BRAIN WITHOUT CONTRAST 05/12/2024 05:53:55 PM TECHNIQUE: Multiplanar multisequence MRI of the head/brain was performed without the administration of intravenous contrast. COMPARISON: Comparison with prior head CT from earlier the same day. CLINICAL HISTORY: FINDINGS: BRAIN AND VENTRICLES: No midline shift. No hydrocephalus. Patchy and confluent T2 FLAIR signal abnormality involving the periventricular and deep white matter of both cerebral hemispheres as well as the pons, consistent with chronic subvascular ischemic disease, advanced in nature. Few scattered remote lacunar infarcts about the hemispheric cerebral white matter. Few scattered remote cerebellar infarcts  noted. Early subacute ischemic infarct involving the superior right cerebellum, right SCA distribution (series 5, image 13). Area of infarction measures up to 4.2 cm. Multiple additional scattered early subacute infarcts seen involving the right greater than left cortical to subcortical cerebral hemispheres. Few scattered foci of associated petechial blood products noted. No significant regional mass effect. Few scattered chronic microhemorrhages noted, likely small vessel hypertensive in nature. The sella is unremarkable. Normal flow voids. ORBITS: No acute abnormality. SINUSES AND MASTOIDS: No acute abnormality. BONES AND SOFT TISSUES: Normal marrow signal. 1.9 cm nodule lesion present at the left parietal scalp (series 8, image 25), indeterminate. IMPRESSION: 1. Early subacute infarct of the superior right cerebellum in the right SCA territory, with additional scattered early subacute infarcts involving the right greater than left cerebral hemispheres. Mild associated petechial blood products without hemorrhagic transformation or significant regional mass effect. 2. Underlying advanced chronic small vessel ischemic disease with scattered remote lacunar and cerebellar infarcts. Electronically signed by: Morene Hoard MD 05/12/2024 06:09 PM EDT RP Workstation: [id number]   CT Head Wo Contrast Result Date: 05/12/2024 EXAM: CT HEAD WITHOUT CONTRAST 05/12/2024 01:56:16 PM TECHNIQUE: CT of the head was performed without the administration of intravenous contrast. Automated exposure control, iterative reconstruction, and/or weight based adjustment of the mA/kV was utilized to reduce the radiation dose to as low as reasonably achievable. COMPARISON: None available. CLINICAL HISTORY: Vertigo, peripheral. Pt BIB EMS from urgent care due to Hyponatremia. Pt c/o dizziness and weakness since  Friday. FINDINGS: BRAIN AND VENTRICLES: No acute hemorrhage. No evidence of acute infarct. Old right cerebellar infarct.  Subacute to chronic right superior cerebellar infarct. Remote infarct in the right occipital lobe and additional remote infarcts in the posterior lateral right temporal lobe. Small remote infarcts in the left cerebellum. There is likely an additional remote infarct in the left thalamus. Atherosclerosis of the carotid siphons. Moderate chronic microvascular ischemic change. No hydrocephalus. No extra-axial collection. No mass effect or midline shift. ORBITS: No acute abnormality. SINUSES: No acute abnormality. SOFT TISSUES AND SKULL: No acute soft tissue abnormality. No skull fracture. IMPRESSION: 1. Possible subacute infarct in the superior right cerebellum. Recommend MRI for further evaluation. 2. Additional remote infarcts in the right cerebellum, right occipital lobe, and posterolateral right temporal lobe. Small remote infarcts in the left cerebellum. Likely additional remote infarct in the left thalamus. 3. Moderate chronic microvascular ischemic change. Electronically signed by: Donnice Mania MD 05/12/2024 02:16 PM EDT RP Workstation: [id number]   DG Chest Port 1 View Result Date: 05/12/2024 CLINICAL DATA:  Dizziness and weakness. EXAM: PORTABLE CHEST 1 VIEW COMPARISON:  May 12, 2024 FINDINGS: The heart size and mediastinal contours are within normal limits. Mild, stable atelectasis and/or infiltrate is seen within the left lung base. Very mild atelectatic changes are also seen within the lateral aspect of the right lung base. There is a small, stable left pleural effusion. No pneumothorax is identified. The visualized skeletal structures are unremarkable. IMPRESSION: 1. Mild, stable left basilar atelectasis and/or infiltrate. 2. Very mild right basilar atelectasis. 3. Small, stable left pleural effusion. Electronically Signed   By: Suzen Dials M.D.   On: 05/12/2024 14:06   DG Chest 2 View Result Date: 05/12/2024 EXAM: 2 VIEW(S) XRAY OF THE CHEST 05/12/2024 11:07:25 AM COMPARISON: None  available. CLINICAL HISTORY: dizziness. Pt c/o dizziness and weakness Onset 6 days ago Also st's her blood glucose is low FINDINGS: LUNGS AND PLEURA: Small left pleural effusion. Left basilar atelectasis. No focal pulmonary opacity. No pulmonary edema. No pneumothorax. HEART AND MEDIASTINUM: Calcified aorta. No acute abnormality of the cardiac and mediastinal silhouettes. BONES AND SOFT TISSUES: No acute osseous abnormality. IMPRESSION: 1. Small left pleural effusion with left basilar atelectasis. Electronically signed by: Oneil Devonshire MD 05/12/2024 11:29 AM EDT RP Workstation: [id number]     PHYSICAL EXAM  Temp:  [97.3 F (36.3 C)-98.6 F (37 C)] 98.6 F (37 C) (10/16 1605) Pulse Rate:  [71-106] 87 (10/16 1605) Resp:  [18-29] 18 (10/16 1605) BP: (135-180)/(56-98) 152/89 (10/16 1605) SpO2:  [93 %-100 %] 93 % (10/16 1605) Weight:  [50.8 kg] 50.8 kg (10/16 1605)  General - Well nourished, well developed, in no apparent distress.  Ophthalmologic - fundi not visualized due to noncooperation.  Cardiovascular - Regular rhythm and rate.  Mental Status -  Level of arousal and orientation to time, place, and person were intact. Language including expression, naming, repetition, comprehension was assessed and found intact. Attention span and concentration were normal. But attempted WORLD backward spelling twice and right at the second time.  Fund of Knowledge was assessed and was intact.  Cranial Nerves II - XII - II - Visual field intact OU. III, IV, VI - Extraocular movements intact. V - Facial sensation intact bilaterally. VII - Facial movement intact bilaterally. VIII - Hearing & vestibular intact bilaterally. X - Palate elevates symmetrically. XI - Chin turning & shoulder shrug intact bilaterally. XII - Tongue protrusion intact.  Motor Strength - The patient's strength was normal in all  extremities and pronator drift was absent.  Bulk was normal and fasciculations were absent.    Motor Tone - Muscle tone was assessed at the neck and appendages and was normal.  Reflexes - The patient's reflexes were symmetrical in all extremities and she had no pathological reflexes.  Sensory - Light touch, temperature/pinprick were assessed and were symmetrical.    Coordination - The patient had normal movements in left hand and foot with no ataxia or dysmetria. However, R FTN and HTS showed mild ataxia. Tremor was absent.  Gait and Station - deferred.   ASSESSMENT/PLAN [name] is a 78 y.o. female with history of HTN, HLD admitted for dizziness, ataxia and generalized weakness. No TNK given due to outside window.    Stroke:  multifocal bilateral infarcts, embolic pattern, secondary to cardioembolic source CT R cerebellar subacute infarct MRI  embolic shower with small infarcts bilaterally including one at R SCA territory.  CTA head and neck unremarkable LE venous doppler no DVT 2D Echo  pending Pt prefer Ziopatch on discharge to rule out afib LDL 47 HgbA1c 5.5 Lovenox for VTE prophylaxis No antithrombotic prior to admission, now on aspirin 81 mg daily and clopidogrel 75 mg daily for 3 weeks and then ASA alone.  Patient counseled to be compliant with her antithrombotic medications Ongoing aggressive stroke risk factor management Therapy recommendations:  pending Disposition:  pending  Hypertension Stable Avoid low BP Long term BP goal normotensive  Hyperlipidemia Home meds:  lipitor 10  LDL 47, goal < 70 Now on lipitor 10 Continue statin at discharge  Other Stroke Risk Factors Advanced age  Other Active Problems Hyponatremia - likely chronic, Na 131  Hospital day # 1    Ary Cummins, MD PhD Stroke Neurology 05/13/2024 4:33 PM    To contact Stroke Continuity provider, please refer to [email or web address]. After hours, contact General Neurology

## 2024-05-14 ENCOUNTER — Inpatient Hospital Stay (HOSPITAL_COMMUNITY)
Admit: 2024-05-14 | Discharge: 2024-05-14 | Disposition: A | Discharge status: Home / Self Care | Attending: Cardiology | Admitting: Cardiology

## 2024-05-14 DIAGNOSIS — E785 Hyperlipidemia, unspecified: Secondary | ICD-10-CM | POA: Diagnosis not present

## 2024-05-14 DIAGNOSIS — E871 Hypo-osmolality and hyponatremia: Secondary | ICD-10-CM | POA: Diagnosis not present

## 2024-05-14 DIAGNOSIS — I639 Cerebral infarction, unspecified: Secondary | ICD-10-CM | POA: Diagnosis not present

## 2024-05-14 LAB — ECHOCARDIOGRAM COMPLETE
Calc EF: 58.3 %
Height: 64 in
Single Plane A2C EF: 58.9 %
Single Plane A4C EF: 58.5 %
Weight: 1792 [oz_av]

## 2024-05-14 LAB — BASIC METABOLIC PANEL WITH GFR
Anion gap: 9 (ref 5–15)
BUN: 10 mg/dL (ref 8–23)
CO2: 26 mmol/L (ref 22–32)
Calcium: 8.4 mg/dL — ABNORMAL LOW (ref 8.9–10.3)
Chloride: 96 mmol/L — ABNORMAL LOW (ref 98–111)
Creatinine, Ser: 0.67 mg/dL (ref 0.44–1.00)
GFR, Estimated: >60 mL/min (ref >60)
Glucose, Bld: 92 mg/dL (ref 70–99)
Potassium: 4 mmol/L (ref 3.5–5.1)
Sodium: 131 mmol/L — ABNORMAL LOW (ref 135–145)

## 2024-05-14 MED ORDER — LOSARTAN POTASSIUM 50 MG PO TABS: 50.0000 mg | ORAL_TABLET | Freq: Every day | ORAL | 3 refills | Status: DC

## 2024-05-14 MED ORDER — ENSURE PLUS HIGH PROTEIN PO LIQD: 237.0000 mL | Freq: Two times a day (BID) | ORAL | 3 refills | Status: DC

## 2024-05-14 MED ORDER — AMLODIPINE BESYLATE 5 MG PO TABS: 2.5000 mg | ORAL_TABLET | Freq: Every day | ORAL | 1 refills | Status: DC

## 2024-05-14 MED ORDER — ASPIRIN 81 MG PO TBEC: 81.0000 mg | DELAYED_RELEASE_TABLET | Freq: Every day | ORAL | 12 refills | Status: DC

## 2024-05-14 MED ORDER — CLOPIDOGREL BISULFATE 75 MG PO TABS: 75.0000 mg | ORAL_TABLET | Freq: Every day | ORAL | 0 refills | Status: DC

## 2024-05-14 NOTE — Evaluation (Signed)
 Physical Therapy Evaluation Patient Details Name: Michele Tyler MRN: [id number] DOB: [birth year] Today's Date: 05/14/2024  History of Present Illness  78 y.o. female [facility]13[facility] with 5 days of dizziness/unsteadiness. 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 PMHx: hypertension, hyperlipidemia   Clinical Impression  PTA pt was independent for mobility with no AD and working part-time. Pt presents with slight R LE weakness, decreased R LE coordination, and impaired balance/gait pattern. When ambulating with no AD, pt was unsteady and staggering left/right with MinA to support. When using a RW, pt had improved steadiness with CGA for safety. Pt had difficulty controlling the RW and would veer left/right. Likely due to increased gait speed with cues to slow down and for RW proximity. Recommending neuro OP PT to work on addressing balance deficits. Pt will have 24/7 assist available upon d/c home. Acute PT to follow.       If plan is discharge home, recommend the following: A little help with walking and/or transfers;A little help with bathing/dressing/bathroom;Assist for transportation;Help with stairs or ramp for entrance   Can travel by private vehicle    Yes    Equipment Recommendations Rolling walker (2 wheels)     Functional Status Assessment Patient has had a recent decline in their functional status and demonstrates the ability to make significant improvements in function in a reasonable and predictable amount of time.     Precautions / Restrictions Precautions Precautions: Fall Recall of Precautions/Restrictions: Intact Restrictions Weight Bearing Restrictions Per Provider Order: No      Mobility  Bed Mobility Overal bed mobility: Needs Assistance Bed Mobility: Supine to Sit, Sit to Supine    Supine to sit: Contact guard Sit to supine: Contact guard assist   General  bed mobility comments: CGA for safety, increased time and effort for supine to sit    Transfers Overall transfer level: Needs assistance Equipment used: None, Rolling walker (2 wheels) Transfers: Sit to/from Stand Sit to Stand: Contact guard assist    General transfer comment: cues for hand placement when using RW    Ambulation/Gait Ambulation/Gait assistance: Min assist, Contact guard assist Gait Distance (Feet): 400 Feet (x150 w/ no AD, x250 with RW) Assistive device: Rolling walker (2 wheels), None Gait Pattern/deviations: Step-through pattern, Decreased stride length, Staggering left, Staggering right, Drifts right/left   Gait velocity interpretation: >4.37 ft/sec, indicative of normal walking speed   General Gait Details: unsteady with no AD with pt staggering right/left with MinA for stability. CGA when using RW with increased difficulty controlling RW likely due to quick gait speed. Cues to slow down and for RW proximity    Modified Rankin (Stroke Patients Only) Modified Rankin (Stroke Patients Only) Pre-Morbid Rankin Score: No symptoms Modified Rankin: Moderately severe disability     Balance Overall balance assessment: Needs assistance Sitting-balance support: No upper extremity supported, Feet supported Sitting balance-Leahy Scale: Good Sitting balance - Comments: seated on EOB   Standing balance support: No upper extremity supported Standing balance-Leahy Scale: Poor Standing balance comment: MinA to maintain dynamic standing balance with no UE support        Pertinent Vitals/Pain Pain Assessment Pain Assessment: No/denies pain    Home Living Family/patient expects to be discharged to:: Private residence Living Arrangements: Alone Available Help at Discharge: Family;Friend(s);Available 24 hours/day Type of Home: House Home Access: Stairs to enter Entrance Stairs-Rails: None Entrance Stairs-Number of Steps: 1 (from garage)   Home Layout:  Two level;1/2  bath on main level;Able to live on main level with bedroom/bathroom Home Equipment: None      Prior Function Prior Level of Function : Independent/Modified Independent;Driving;Working/employed    Mobility Comments: Ind with no AD ADLs Comments: works part time in [place], has 6 dogs at home     Extremity/Trunk Assessment   Upper Extremity Assessment Upper Extremity Assessment: Defer to OT evaluation    Lower Extremity Assessment Lower Extremity Assessment: RLE deficits/detail RLE Deficits / Details: Hip flexion 4+/5, Knee ext 5/5, Ankle DF 4+/5 RLE Sensation: WNL RLE Coordination: decreased gross motor    Cervical / Trunk Assessment Cervical / Trunk Assessment: Normal  Communication   Communication Communication: No apparent difficulties    Cognition Arousal: Alert Behavior During Therapy: WFL for tasks assessed/performed   PT - Cognitive impairments: No apparent impairments    Following commands: Intact       Cueing Cueing Techniques: Verbal cues     General Comments General comments (skin integrity, edema, etc.): VSS on RA     PT Assessment Patient needs continued PT services  PT Problem List Decreased strength;Decreased activity tolerance;Decreased balance;Decreased mobility;Decreased coordination;Decreased knowledge of use of DME       PT Treatment Interventions DME instruction;Gait training;Stair training;Functional mobility training;Therapeutic activities;Therapeutic exercise;Balance training;Neuromuscular re-education;Patient/family education    PT Goals (Current goals can be found in the Care Plan section)  Acute Rehab PT Goals Patient Stated Goal: to work on balance PT Goal Formulation: With patient Time For Goal Achievement: 05/28/24 Potential to Achieve Goals: Good    Frequency Min 3X/week        AM-PAC PT 6 Clicks Mobility  Outcome Measure Help needed turning from your back to your side while in a flat bed without using bedrails?: A  Little Help needed moving from lying on your back to sitting on the side of a flat bed without using bedrails?: A Little Help needed moving to and from a bed to a chair (including a wheelchair)?: A Little Help needed standing up from a chair using your arms (e.g., wheelchair or bedside chair)?: A Little Help needed to walk in hospital room?: A Little Help needed climbing 3-5 steps with a railing? : A Little 6 Click Score: 18    End of Session Equipment Utilized During Treatment: Gait belt Activity Tolerance: Patient tolerated treatment well Patient left: in bed;with call bell/phone within reach;with bed alarm set Nurse Communication: Mobility status PT Visit Diagnosis: Unsteadiness on feet (R26.81);Other abnormalities of gait and mobility (R26.89);Muscle weakness (generalized) (M62.81)    Time: 9157-9097 PT Time Calculation (min) (ACUTE ONLY): 20 min   Charges:   PT Evaluation $PT Eval Low Complexity: 1 Low   PT General Charges $$ ACUTE PT VISIT: 1 Visit       Kate ORN, PT, DPT Secure Chat Preferred  Rehab Office [phone]   Kate BRAVO [name] 05/14/2024, 9:53 AM

## 2024-05-14 NOTE — Plan of Care (Addendum)
 D/C canceled r/t friend stating concerns over only a toilet/powder room on first floor. Pt friends concerned over pt ambulating up and down stairs, claiming 15 to 20 stairs for the 2nd floor. M.D. made aware as well as Social work and rest of care team. Pt did have DME delivered to room. Patient and friends educated on process of what may need to be reviewed with patient and friends. Michele Tyler 05/14/24 6:04 PM

## 2024-05-14 NOTE — TOC Initial Note (Signed)
 Transition of Care [facility]) - Initial/Assessment Note    Patient Details  Name: Michele Tyler MRN: [id number] Date of Birth: Oct 12, 1945  Transition of Care [facility]) CM/SW Contact:    Andrez JULIANNA George, RN Phone Number: 05/14/2024, 3:29 PM  Clinical Narrative:                 Patient is a 78 year old female with history of hypertension hyperlipidemia presenting with 5 days of dizziness and unsteadiness found to have subacute stroke.   Pt is from home alone. She has a friend that is going to stay with her until the 26th of October.  She was driving self but friend can provide needed transportation. She manages her own medications.  Outpatient therapy arranged with [facility]. Information on the AVS. She will call to schedule the first appointment. Walker ordered through [employer] and will be delivered to the room.  IP care management following.  Expected Discharge Plan: OP Rehab Barriers to Discharge: Continued Medical Work up   Patient Goals and CMS Choice   CMS Medicare.gov Compare Post Acute Care list provided to:: Patient Choice offered to / list presented to : Patient      Expected Discharge Plan and Services   Discharge Planning Services: CM Consult Post Acute Care Choice: Durable Medical Equipment Living arrangements for the past 2 months: Single Family Home                 DME Arranged: Walker rolling DME Agency: [employer] Date DME Agency Contacted: 05/14/24   Representative spoke with at DME Agency: [place]            Prior Living Arrangements/Services Living arrangements for the past 2 months: Single Family Home Lives with:: Self Patient language and need for interpreter reviewed:: Yes Do you feel safe going back to the place where you live?: Yes        Care giver support system in place?: Yes (comment) Current home services: DME (crutches) Criminal Activity/Legal Involvement Pertinent to Current Situation/Hospitalization: No - Comment as  needed  Activities of Daily Living   ADL Screening (condition at time of admission) Independently performs ADLs?: Yes (appropriate for developmental age) Is the patient deaf or have difficulty hearing?: No Does the patient have difficulty seeing, even when wearing glasses/contacts?: No Does the patient have difficulty concentrating, remembering, or making decisions?: No  Permission Sought/Granted                  Emotional Assessment Appearance:: Appears stated age Attitude/Demeanor/Rapport: Engaged Affect (typically observed): Accepting Orientation: : Oriented to Self, Oriented to Place, Oriented to  Time, Oriented to Situation   Psych Involvement: No (comment)  Admission diagnosis:  Hyponatremia [E87.1] Cerebral infarction [facility]) [I63.9] Cerebrovascular accident (CVA), unspecified mechanism (HCC) [I63.9] Patient Active Problem List   Diagnosis Date Noted   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   Dizziness 05/11/2024   Nausea 04/12/2024   Hyponatremia 01/06/2024   Urinary and fecal incontinence 11/27/2023   Itchy scalp 09/05/2023   Iron deficiency 07/04/2019   B12 deficiency 07/04/2019   Lipoma 07/04/2019   Fear of other medical care- signficant fears of drug S-E inhibiting pt's ability to take medications 07/02/2019   Insomnia 03/25/2018   Hyperlipidemia 03/25/2018   Hypertension, goal below 150/90 02/18/2018   Mood disorder 02/18/2018   Generalized anxiety disorder 03/19/2013   PCP:  [name], MD Pharmacy:   [id number] - [place] ([place]), [place] - [address]  [place]) [place] [address] Phone: [phone] Fax: [phone]     Social Drivers of Health (SDOH) Social History: SDOH Screenings   Food Insecurity: No Food Insecurity (05/13/2024)  Housing: Low Risk  (05/13/2024)  Transportation Needs: No Transportation Needs (05/13/2024)  Utilities: Not At Risk (05/13/2024)  Alcohol Screen: Low Risk  (08/04/2023)   Depression (PHQ2-9): High Risk (04/08/2024)  Financial Resource Strain: Low Risk  (08/04/2023)  Physical Activity: Sufficiently Active (08/04/2023)  Social Connections: Moderately Isolated (05/13/2024)  Stress: Stress Concern Present (08/04/2023)  Tobacco Use: Medium Risk (05/12/2024)   SDOH Interventions:     Readmission Risk Interventions     No data to display

## 2024-05-14 NOTE — Progress Notes (Signed)
-----------------------------------------------------------  [facility]--------------------------------------------------- D(Data) A(Action) R(response)     Data: Noted PT Rec. For [employer]    Action: EPIC Secure Chat to MD    Response: MD to place orders     Deasiah Hagberg, RN [employer]

## 2024-05-14 NOTE — Progress Notes (Signed)
 [facility]                                                                               Jamesia Linnen, is a 78 y.o. female, DOB - Oct 31, 1945, FMW:[id number] Admit date - 05/12/2024    Outpatient Primary MD for the patient is [name], MD  LOS - 2  days    Brief summary   78 year old with past medical history significant for hypertension, hyperlipidemia presents with 5 days of dizziness, unsteadiness she was found to have subacute stroke.    Assessment & Plan    Assessment and Plan:   Cerebellar stroke.  Stroke work up done, neurology recommended aspirin and plavix followed by aspirin alone.  Therapy eval recommending outpatient PT/OT.  Discussed with cardiology TEE to be set up outpatient.    Hyponatremia from hydrochlorothiazide.  Monitor. Hold hydrochlorothiazide.    Hypertension Well controlled.    IDA Continue with iron supplementations.     Estimated body mass index is 19.22 kg/m as calculated from the following:   Height as of this encounter: 5' 4 (1.626 m).   Weight as of this encounter: 50.8 kg.  Code Status: full code.  DVT Prophylaxis:  enoxaparin (LOVENOX) injection 40 mg Start: 05/12/24 2200   Level of Care: Level of care: Telemetry Medical Family Communication: Updated patient's friend at bedside.   Disposition Plan:     Remains inpatient appropriate: medically stable for discharge. But patient's friend did not feel safe to take her home.    Procedures:  Echocardiogram.   Consultants:   Neurology.   Antimicrobials:   Anti-infectives (From admission, onward)    None        Medications  Scheduled Meds:   stroke: early stages of recovery book   Does not apply Once   aspirin EC  81 mg Oral Daily   atorvastatin   10 mg Oral QHS   buPROPion   150 mg Oral Daily   busPIRone   7.5 mg Oral TID   cholecalciferol  5,000 Units Oral Q breakfast   clopidogrel  75 mg Oral Daily   enoxaparin (LOVENOX) injection  40  mg Subcutaneous Q24H   feeding supplement  237 mL Oral BID BM   ferrous sulfate  325 mg Oral Q breakfast   folic acid   1,000 mcg Oral Daily   losartan   50 mg Oral Daily   Continuous Infusions: PRN Meds:.acetaminophen **OR** acetaminophen, ondansetron  (ZOFRAN ) IV, senna-docusate    Subjective:   Alazay Leicht was seen and examined today.  No new complaints.   Objective:   Vitals:   05/14/24 0358 05/14/24 0727 05/14/24 1147 05/14/24 1608  BP: 127/63 (!) 152/76 (!) 170/81 (!) 142/83  Pulse: 81 90 (!) 101 91  Resp: 15 19 19 19   Temp: 99.4 F (37.4 C) 98.1 F (36.7 C) 98.5 F (36.9 C) 98.5 F (36.9 C)  TempSrc:  Oral Oral Oral  SpO2: 95% 94% 95% 97%  Weight:      Height:       No intake or output data in the 24 hours ending 05/14/24 1907 Filed Weights   05/13/24 1605  Weight: 50.8 kg     Exam  General: Alert and oriented x 3, NAD Cardiovascular: S1 S2 auscultated, no murmurs, RRR Respiratory: Clear to auscultation bilaterally,  Gastrointestinal: Soft, nontender, nondistended, + bowel sounds Ext: no pedal edema bilaterally Neuro: AAOx3,  Skin: No rashes    Data Reviewed:  I have personally reviewed following labs and imaging studies   CBC Lab Results  Component Value Date   WBC 7.5 05/13/2024   RBC 4.13 05/13/2024   HGB 11.5 (L) 05/13/2024   HCT 35.2 (L) 05/13/2024   MCV 85.2 05/13/2024   MCH 27.8 05/13/2024   PLT 439 (H) 05/13/2024   MCHC 32.7 05/13/2024   RDW 12.9 05/13/2024   LYMPHSABS 0.5 (L) 05/12/2024   MONOABS 0.7 05/12/2024   EOSABS 0.0 05/12/2024   BASOSABS 0.0 05/12/2024     Last metabolic panel Lab Results  Component Value Date   NA 131 (L) 05/14/2024   K 4.0 05/14/2024   CL 96 (L) 05/14/2024   CO2 26 05/14/2024   BUN 10 05/14/2024   CREATININE 0.67 05/14/2024   GLUCOSE 92 05/14/2024   GFRNONAA >60 05/14/2024   GFRAA 106 07/05/2020   CALCIUM  8.4 (L) 05/14/2024   PROT 7.2 05/12/2024   ALBUMIN 4.5 05/12/2024   LABGLOB 2.2  05/10/2024   AGRATIO 2.1 01/03/2023   BILITOT 0.4 05/12/2024   ALKPHOS 94 05/12/2024   AST 44 (H) 05/12/2024   ALT 18 05/12/2024   ANIONGAP 9 05/14/2024    CBG (last 3)  No results for input(s): GLUCAP in the last 72 hours.    Coagulation Profile: No results for input(s): INR, PROTIME in the last 168 hours.   Radiology Studies: ECHOCARDIOGRAM COMPLETE Result Date: 05/14/2024    ECHOCARDIOGRAM REPORT   Patient Name:   ANABELA CRAYTON Date of Exam: 05/13/2024 Medical Rec #:  [id number]       Height:       64.0 in Accession #:    [id number]      Weight:       112.0 lb Date of Birth:  [birth year]      BSA:          1.529 m Patient Age:    77 years        BP:           152/89 mmHg Patient Gender: F               HR:           50 bpm. Exam Location:  Inpatient Procedure: 2D Echo, Cardiac Doppler and Color Doppler (Both Spectral and Color            Flow Doppler were utilized during procedure). Indications:    Stroke  History:        Patient has no prior history of Echocardiogram examinations.                 Stroke; Risk Factors:Dyslipidemia, Hypertension and Former                 Smoker.  Sonographer:    Juliene Rucks Referring Phys: [id number] ASHISH ARORA IMPRESSIONS  1. Left ventricular ejection fraction, by estimation, is 55 to 60%. The left ventricle has normal function. The left ventricle has no regional wall motion abnormalities. Left ventricular diastolic parameters were normal.  2. Right ventricular systolic function is normal. The right ventricular size is normal.  3. The mitral valve is normal in structure. Trivial mitral valve regurgitation. No evidence of mitral stenosis.  4. There appears to be  a mobile echolucent mass on the right coronary cusp which is likely calcification however in the setting of a stroke TEE will be beneficial for clarity. The aortic valve is normal in structure. Aortic valve regurgitation is not visualized. No aortic stenosis is present.  5. The inferior vena  cava is normal in size with greater than 50% respiratory variability, suggesting right atrial pressure of 3 mmHg. FINDINGS  Left Ventricle: Left ventricular ejection fraction, by estimation, is 55 to 60%. The left ventricle has normal function. The left ventricle has no regional wall motion abnormalities. The left ventricular internal cavity size was normal in size. There is  no left ventricular hypertrophy. Left ventricular diastolic parameters were normal. Right Ventricle: The right ventricular size is normal. No increase in right ventricular wall thickness. Right ventricular systolic function is normal. Left Atrium: Left atrial size was normal in size. Right Atrium: Right atrial size was normal in size. Prominent Eustachian valve. Pericardium: There is no evidence of pericardial effusion. Mitral Valve: The mitral valve is normal in structure. Trivial mitral valve regurgitation. No evidence of mitral valve stenosis. Tricuspid Valve: The tricuspid valve is normal in structure. Tricuspid valve regurgitation is trivial. No evidence of tricuspid stenosis. 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 The aortic valve is normal in structure. Aortic valve regurgitation  is not visualized. No aortic stenosis is present. Pulmonic Valve: The pulmonic valve was normal in structure. Pulmonic valve regurgitation is not visualized. No evidence of pulmonic stenosis. Aorta: The aortic root is normal in size and structure. Venous: The inferior vena cava is normal in size with greater than 50% respiratory variability, suggesting right atrial pressure of 3 mmHg. IAS/Shunts: No atrial level shunt detected by color flow Doppler.   LV Volumes (MOD) LV vol d, MOD A2C: 93.0 ml LV vol d, MOD A4C: 75.0 ml LV vol s, MOD A2C: 38.2 ml LV vol s, MOD A4C: 31.1 ml LV SV MOD A2C:     54.8 ml LV SV MOD A4C:      75.0 ml LV SV MOD BP:      49.1 ml RIGHT VENTRICLE          IVC RV Basal diam:  2.80 cm  IVC diam: 1.00 cm RV Mid diam:    2.20 cm LEFT ATRIUM             Index        RIGHT ATRIUM          Index LA Vol (A2C):   22.5 ml 14.71 ml/m  RA Area:     8.98 cm LA Vol (A4C):   33.0 ml 21.58 ml/m  RA Volume:   18.50 ml 12.10 ml/m LA Biplane Vol: 30.2 ml 19.75 ml/m Kardie Tobb DO Electronically signed by Dub Huntsman DO Signature Date/Time: 05/14/2024/8:00:12 AM    Final    VAS US  LOWER EXTREMITY VENOUS (DVT) Result Date: 05/13/2024  Lower Venous DVT Study Patient Name:  CHANEL MCADAMS  Date of Exam:   05/13/2024 Medical Rec #: [id number]        Accession #:    [id number] Date of Birth: [birth year]       Patient Gender: F Patient Age:   60 years Exam Location:  [facility] Procedure:      VAS US  LOWER EXTREMITY VENOUS (DVT) Referring Phys: ARY XU --------------------------------------------------------------------------------  Indications: Stroke.  Comparison Study: No prior study on file Performing Technologist: Alberta Lis RVS  Examination Guidelines: A complete evaluation includes B-mode imaging, spectral Doppler, color Doppler, and power Doppler as needed of all accessible portions of each vessel. Bilateral testing is considered an integral part of a complete examination. Limited examinations for reoccurring indications may be performed as noted. The reflux portion of the exam is performed with the patient in reverse Trendelenburg.  +---------+---------------+---------+-----------+----------+--------------+ RIGHT    CompressibilityPhasicitySpontaneityPropertiesThrombus Aging +---------+---------------+---------+-----------+----------+--------------+ CFV      Full           Yes      Yes                                 +---------+---------------+---------+-----------+----------+--------------+ SFJ      Full                                                         +---------+---------------+---------+-----------+----------+--------------+ FV Prox  Full           Yes      Yes                                 +---------+---------------+---------+-----------+----------+--------------+ FV Mid   Full                                                        +---------+---------------+---------+-----------+----------+--------------+ FV DistalFull                                                        +---------+---------------+---------+-----------+----------+--------------+ PFV      Full           Yes      No                                  +---------+---------------+---------+-----------+----------+--------------+ POP      Full           Yes      Yes                                 +---------+---------------+---------+-----------+----------+--------------+ PTV      Full                                                        +---------+---------------+---------+-----------+----------+--------------+ PERO     Full                                                        +---------+---------------+---------+-----------+----------+--------------+   +---------+---------------+---------+-----------+----------+-------------------+  LEFT     CompressibilityPhasicitySpontaneityPropertiesThrombus Aging      +---------+---------------+---------+-----------+----------+-------------------+ CFV      Full           Yes      Yes                                      +---------+---------------+---------+-----------+----------+-------------------+ SFJ      Full                                                             +---------+---------------+---------+-----------+----------+-------------------+ FV Prox  Full                                                             +---------+---------------+---------+-----------+----------+-------------------+ FV Mid   Full                                                              +---------+---------------+---------+-----------+----------+-------------------+ FV DistalFull                                                             +---------+---------------+---------+-----------+----------+-------------------+ PFV      Full                                                             +---------+---------------+---------+-----------+----------+-------------------+ POP                     Yes      Yes                  patent by color and                                                       Doppler             +---------+---------------+---------+-----------+----------+-------------------+ PTV      Full                                                             +---------+---------------+---------+-----------+----------+-------------------+ PERO     Full                                                             +---------+---------------+---------+-----------+----------+-------------------+  Summary: BILATERAL: - No evidence of deep vein thrombosis seen in the lower extremities, bilaterally. -No evidence of popliteal cyst, bilaterally.   *See table(s) above for measurements and observations. Electronically signed by Debby Robertson on 05/13/2024 at 5:14:37 PM.    Final    CT ANGIO HEAD NECK W WO CM Result Date: 05/12/2024 EXAM: CTA HEAD AND NECK WITHOUT AND WITH 05/12/2024 07:25:25 PM TECHNIQUE: CTA of the head and neck was performed without and with the administration of 75 mL of iohexol (OMNIPAQUE) 350 MG/ML injection. Multiplanar 2D and/or 3D reformatted images are provided for review. Automated exposure control, iterative reconstruction, and/or weight based adjustment of the mA/kV was utilized to reduce the radiation dose to as low as reasonably achievable. Stenosis of the internal carotid arteries measured using NASCET criteria. COMPARISON: Comparison with prior CT and MRI from earlier the same day. CLINICAL HISTORY: Stroke/TIA,  determine embolic source. Pt presents today due to dizziness for the past 6 days. Pt was seen by her PCP 2 days ago and they performed blood work that was found to be unremarkable. Pt states that she is still experiencing dizziness, states that she feels when she stands from a sitting position it takes her a minute to catch her balance, pt denies room spinning. FINDINGS: LIMITATIONS: Examination degraded by motion artifact. CTA NECK: AORTIC ARCH AND ARCH VESSELS: Mild aortic atherosclerosis. No dissection or arterial injury. No significant stenosis of the brachiocephalic or subclavian arteries. CERVICAL CAROTID ARTERIES: No dissection, arterial injury, or hemodynamically significant stenosis by NASCET criteria. CERVICAL VERTEBRAL ARTERIES: Right vertebral artery dominant. No dissection, arterial injury, or significant stenosis. LUNGS AND MEDIASTINUM: Moderate layering bilateral pleural effusions, left larger than right. Mild pulmonary interstitial congestion within the visualized aerated portions of the lungs. SOFT TISSUES: No acute abnormality. BONES: Grade 1 degenerative anterolisthesis of C4 on C5. Moderate cervical spondylosis at C5-C6 and C6-C7. CTA HEAD: ANTERIOR CIRCULATION: No significant stenosis of the internal carotid arteries. No significant stenosis of the anterior cerebral arteries. No significant stenosis of the middle cerebral arteries. No aneurysm. POSTERIOR CIRCULATION: Right PCA supplied via the basilar as well as a prominent right posterior carotid artery. Fetal type origin of the left PCA. No significant stenosis of the posterior cerebral arteries. No significant stenosis of the basilar artery. No significant stenosis of the vertebral arteries. No aneurysm. OTHER: No dural venous sinus thrombosis on this non-dedicated study. IMPRESSION: 1. Negative CTA with no large vessel occlusion or other emergent findings. Mild premature atheromatous disease without hemodynamically significant or correctable  stenosis. 2. Moderate layering pleural effusions, left greater than right, with mild pulmonary interstitial congestion within the visualized lungs. Electronically signed by: Morene Hoard MD 05/12/2024 07:46 PM EDT RP Workstation: [id number]       Elgie Butter M.D. [facility] 05/14/2024, 7:07 PM  Available via Epic secure chat 7am-7pm After 7 pm, please refer to night coverage provider listed on amion.

## 2024-05-14 NOTE — Evaluation (Signed)
 Occupational Therapy Evaluation Patient Details Name: Michele Tyler MRN: [id number] DOB: January 05, 1946 Today's Date: 05/14/2024   History of Present Illness   78 y.o. female presents to [facility] 05/13/24 with 5 days of dizziness/unsteadiness. 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 PMHx: hypertension, hyperlipidemia     Clinical Impressions Prior to this admission, patient living alone, and working part time rescuing dogs. Patient did not use AD for mobility. Currently, patient with RUE ataxia, decreased cognition and awareness, decreased balance, and need for assist for ADL management. Patient requiring max cues for RW management with ambulation, and cues for throughness for ADL management. Patient advised not to drive till cleared by MD with patient stating she lived alone and didnt not have. Patient did not recall plan for friends to stay with her that she explained to PT a few hours earlier. OT recommeding outpatient OT with nuero focus at discharge. OT will continue to follow acutely.      If plan is discharge home, recommend the following:   A little help with walking and/or transfers;A little help with bathing/dressing/bathroom;Assistance with cooking/housework;Direct supervision/assist for medications management;Direct supervision/assist for financial management;Assist for transportation;Help with stairs or ramp for entrance;Supervision due to cognitive status     Functional Status Assessment   Patient has had a recent decline in their functional status and demonstrates the ability to make significant improvements in function in a reasonable and predictable amount of time.     Equipment Recommendations   None recommended by OT     Recommendations for Other Services         Precautions/Restrictions   Precautions Precautions: Fall Recall of Precautions/Restrictions:  Intact Restrictions Weight Bearing Restrictions Per Provider Order: No     Mobility Bed Mobility Overal bed mobility: Needs Assistance Bed Mobility: Supine to Sit, Sit to Supine     Supine to sit: Contact guard Sit to supine: Contact guard assist   General bed mobility comments: CGA for safety, increased time and effort for supine to sit    Transfers Overall transfer level: Needs assistance Equipment used: None, Rolling walker (2 wheels) Transfers: Sit to/from Stand Sit to Stand: Contact guard assist           General transfer comment: cues for hand placement when using RW      Balance Overall balance assessment: Needs assistance Sitting-balance support: No upper extremity supported, Feet supported Sitting balance-Leahy Scale: Good Sitting balance - Comments: seated on EOB   Standing balance support: No upper extremity supported Standing balance-Leahy Scale: Poor Standing balance comment: MinA to maintain dynamic standing balance with no UE support                           ADL either performed or assessed with clinical judgement   ADL Overall ADL's : Needs assistance/impaired Eating/Feeding: Set up;Sitting   Grooming: Minimal assistance;Standing Grooming Details (indicate cue type and reason): unaware of toothpaste in hair Upper Body Bathing: Contact guard assist;Sitting   Lower Body Bathing: Contact guard assist;Sitting/lateral leans;Sit to/from stand   Upper Body Dressing : Contact guard assist;Sitting   Lower Body Dressing: Minimal assistance;Sitting/lateral leans;Sit to/from stand   Toilet Transfer: Minimal assistance;Ambulation   Toileting- Clothing Manipulation and Hygiene: Contact guard assist;Sitting/lateral lean;Sit to/from stand       Functional mobility during ADLs: Contact guard assist;Cueing for sequencing;Cueing for safety;Rolling walker (2 wheels) General ADL Comments: Prior to  this admission, patient living alone, and working  part time rescuing dogs. Patient did not use AD for mobility. Currently, patient with RUE ataxia, decreased cognition and awareness, decreased balance, and need for assist for ADL management. Patient requiring max cues for RW management with ambulation, and cues for throughness for ADL management. Patient advised not to drive till cleared by MD with patient stating she lived alone and didnt not have. Patient did not recall plan for friends to stay with her that she explained to PT a few hours earlier. OT recommeding outpatient OT with nuero focus at discharge. OT will continue to follow acutely.     Vision Baseline Vision/History: 1 Wears glasses Ability to See in Adequate Light: 0 Adequate Patient Visual Report: No change from baseline Vision Assessment?: Yes Eye Alignment: Within Functional Limits Ocular Range of Motion: Within Functional Limits Alignment/Gaze Preference: Within Defined Limits Tracking/Visual Pursuits: Able to track stimulus in all quads without difficulty Saccades: Decreased speed of saccadic movement Convergence: Within functional limits     Perception Perception: Impaired Preception Impairment Details: Figure ground     Praxis Praxis: Impaired Praxis Impairment Details: Organization     Pertinent Vitals/Pain Pain Assessment Pain Assessment: No/denies pain     Extremity/Trunk Assessment Upper Extremity Assessment Upper Extremity Assessment: RUE deficits/detail;Right hand dominant RUE Deficits / Details: RUE ataxia RUE Sensation: WNL RUE Coordination: decreased fine motor       Cervical / Trunk Assessment Cervical / Trunk Assessment: Normal   Communication Communication Communication: No apparent difficulties   Cognition Arousal: Alert Behavior During Therapy: WFL for tasks assessed/performed Cognition: Cognition impaired     Awareness: Online awareness impaired, Intellectual awareness impaired Memory impairment (select all impairments): Short-term  memory Attention impairment (select first level of impairment): Sustained attention Executive functioning impairment (select all impairments): Reasoning, Problem solving, Organization OT - Cognition Comments: Patient with decreased awareness throughout session, did not recognize toothpaste in her hair, required increased assist for ADLs (searching for soap though it was right in front of her) decreased STM deficits noted                 Following commands: Intact       Cueing  General Comments   Cueing Techniques: Verbal cues  VSS on RA   Exercises     Shoulder Instructions      Home Living Family/patient expects to be discharged to:: Private residence Living Arrangements: Alone Available Help at Discharge: Family;Friend(s);Available 24 hours/day Type of Home: House Home Access: Stairs to enter [employer] of Steps: 1 (from garage) Entrance Stairs-Rails: None Home Layout: Two level;1/2 bath on main level;Able to live on main level with bedroom/bathroom     Bathroom Shower/Tub: Chief Strategy Officer: Standard     Home Equipment: None          Prior Functioning/Environment Prior Level of Function : Independent/Modified Independent;Driving;Working/employed             Mobility Comments: Ind with no AD ADLs Comments: works part time in [place], has 6 dogs at home    OT Problem List: Decreased activity tolerance;Decreased coordination;Decreased cognition;Decreased safety awareness;Impaired balance (sitting and/or standing)   OT Treatment/Interventions: Self-care/ADL training;Therapeutic exercise;Neuromuscular education;Energy conservation;DME and/or AE instruction;Therapeutic activities;Patient/family education;Balance training      OT Goals(Current goals can be found in the care plan section)   Acute Rehab OT Goals Patient Stated Goal: to go home OT Goal Formulation: With patient/family Time For Goal Achievement:  05/28/24 Potential to Achieve  Goals: Good ADL Goals Pt Will Perform Lower Body Bathing: with modified independence;sitting/lateral leans;sit to/from stand Pt Will Perform Lower Body Dressing: with modified independence;sit to/from stand;sitting/lateral leans Pt Will Transfer to Toilet: with modified independence;ambulating;regular height toilet Pt Will Perform Toileting - Clothing Manipulation and hygiene: with modified independence;sitting/lateral leans;sit to/from stand Additional ADL Goal #1: Patient will be able to demonstrate increased cognitive awareness to be able to complete ADLs or IADLs without prompting or assist.   OT Frequency:  Min 2X/week    Co-evaluation              AM-PAC OT 6 Clicks Daily Activity     Outcome Measure Help from another person eating meals?: A Little Help from another person taking care of personal grooming?: A Little Help from another person toileting, which includes using toliet, bedpan, or urinal?: A Little Help from another person bathing (including washing, rinsing, drying)?: A Little Help from another person to put on and taking off regular upper body clothing?: A Little Help from another person to put on and taking off regular lower body clothing?: A Little 6 Click Score: 18   End of Session Equipment Utilized During Treatment: Gait belt;Rolling walker (2 wheels) Nurse Communication: Mobility status  Activity Tolerance: Patient tolerated treatment well Patient left: in bed;with call bell/phone within reach (sitting EOB)  OT Visit Diagnosis: Unsteadiness on feet (R26.81);Other abnormalities of gait and mobility (R26.89);Muscle weakness (generalized) (M62.81);Other symptoms and signs involving cognitive function                Time: 9052-8990 OT Time Calculation (min): 22 min Charges:  OT General Charges $OT Visit: 1 Visit OT Evaluation $OT Eval Moderate Complexity: 1 Mod  Ronal Gift E. Tikia Skilton, OTR/L Acute Rehabilitation  Services [phone]   Ronal Gift Salt 05/14/2024, 4:49 PM

## 2024-05-14 NOTE — Progress Notes (Signed)
 STROKE TEAM PROGRESS NOTE   SUBJECTIVE (INTERVAL HISTORY) Her friend is at the bedside.  Pt lying in bed, drinking fluid. No acute event overnight and no complains. Ziopatch has been placed and pt plan to be discharged later this afternoon.    OBJECTIVE Temp:  [97.8 F (36.6 C)-99.4 F (37.4 C)] 98.5 F (36.9 C) (10/17 1147) Pulse Rate:  [78-101] 101 (10/17 1147) Cardiac Rhythm: Normal sinus rhythm (10/17 0738) Resp:  [15-24] 19 (10/17 1147) BP: (127-170)/(63-89) 170/81 (10/17 1147) SpO2:  [93 %-99 %] 95 % (10/17 1147) Weight:  [50.8 kg] 50.8 kg (10/16 1605)  No results for input(s): GLUCAP in the last 168 hours. Recent Labs  Lab 05/10/24 1432 05/12/24 1420 05/13/24 0533 05/14/24 0514  NA 131* 130* 131* 131*  K 4.6 4.0 3.6 4.0  CL 91* 91* 95* 96*  CO2 26 27 27 26   GLUCOSE 91 91 97 92  BUN 12 9 8 10   CREATININE 0.69 0.64 0.57 0.67  CALCIUM  9.8 9.5 8.6* 8.4*   Recent Labs  Lab 05/10/24 1432 05/12/24 1420  AST 36 44*  ALT 19 18  ALKPHOS 98 94  BILITOT 0.3 0.4  PROT 6.5 7.2  ALBUMIN 4.3 4.5   Recent Labs  Lab 05/10/24 1432 05/12/24 1420 05/13/24 0533  WBC 9.0 9.4 7.5  NEUTROABS 7.6* 8.2*  --   HGB 12.5 12.7 11.5*  HCT 38.2 37.7 35.2*  MCV 89 84.9 85.2  PLT 497* 486* 439*   No results for input(s): CKTOTAL, CKMB, CKMBINDEX, TROPONINI in the last 168 hours. No results for input(s): LABPROT, INR in the last 72 hours. Recent Labs    05/12/24 1604  COLORURINE STRAW*  LABSPEC 1.004*  PHURINE 7.0  GLUCOSEU NEGATIVE  HGBUR NEGATIVE  BILIRUBINUR NEGATIVE  KETONESUR 5*  PROTEINUR NEGATIVE  NITRITE NEGATIVE  LEUKOCYTESUR NEGATIVE       Component Value Date/Time   CHOL 124 05/13/2024 0533   CHOL 160 12/24/2023 0949   TRIG 75 05/13/2024 0533   HDL 63 05/13/2024 0533   HDL 75 12/24/2023 0949   CHOLHDL 2.0 05/13/2024 0533   VLDL 15 05/13/2024 0533   LDLCALC 47 05/13/2024 0533   LDLCALC 71 12/24/2023 0949   Lab Results  Component Value  Date   HGBA1C 5.5 05/12/2024   No results found for: LABOPIA, COCAINSCRNUR, LABBENZ, AMPHETMU, THCU, LABBARB  No results for input(s): ETH in the last 168 hours.  I have personally reviewed the radiological images below and agree with the radiology interpretations.  ECHOCARDIOGRAM COMPLETE Result Date: 05/14/2024    ECHOCARDIOGRAM REPORT   [name] Date of Exam: 05/13/2024 Medical Rec #:  [id number]       Height:       64.0 in Accession #:    [id number]      Weight:       112.0 lb Date of Birth:  06/29/1946      BSA:          1.529 m Patient Age:    77 years        BP:           152/89 mmHg Patient Gender: F               HR:           50 bpm. Exam Location:  Inpatient Procedure: 2D Echo, Cardiac Doppler and Color Doppler (Both Spectral and Color            Flow  Doppler were utilized during procedure). Indications:    Stroke  History:        Patient has no prior history of Echocardiogram examinations.                 Stroke; Risk Factors:Dyslipidemia, Hypertension and Former                 Smoker.  Sonographer:    Juliene Rucks Referring Phys: [id number] ASHISH ARORA IMPRESSIONS  1. Left ventricular ejection fraction, by estimation, is 55 to 60%. The left ventricle has normal function. The left ventricle has no regional wall motion abnormalities. Left ventricular diastolic parameters were normal.  2. Right ventricular systolic function is normal. The right ventricular size is normal.  3. The mitral valve is normal in structure. Trivial mitral valve regurgitation. No evidence of mitral stenosis.  4. There appears to be a mobile echolucent mass on the right coronary cusp which is likely calcification however in the setting of a stroke TEE will be beneficial for clarity. The aortic valve is normal in structure. Aortic valve regurgitation is not visualized. No aortic stenosis is present.  5. The inferior vena cava is normal in size with greater than 50% respiratory  variability, suggesting right atrial pressure of 3 mmHg. FINDINGS  Left Ventricle: Left ventricular ejection fraction, by estimation, is 55 to 60%. The left ventricle has normal function. The left ventricle has no regional wall motion abnormalities. The left ventricular internal cavity size was normal in size. There is  no left ventricular hypertrophy. Left ventricular diastolic parameters were normal. Right Ventricle: The right ventricular size is normal. No increase in right ventricular wall thickness. Right ventricular systolic function is normal. Left Atrium: Left atrial size was normal in size. Right Atrium: Right atrial size was normal in size. Prominent Eustachian valve. Pericardium: There is no evidence of pericardial effusion. Mitral Valve: The mitral valve is normal in structure. Trivial mitral valve regurgitation. No evidence of mitral valve stenosis. Tricuspid Valve: The tricuspid valve is normal in structure. Tricuspid valve regurgitation is trivial. No evidence of tricuspid stenosis. 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 The aortic valve is normal in structure. Aortic valve regurgitation  is not visualized. No aortic stenosis is present. Pulmonic Valve: The pulmonic valve was normal in structure. Pulmonic valve regurgitation is not visualized. No evidence of pulmonic stenosis. Aorta: The aortic root is normal in size and structure. Venous: The inferior vena cava is normal in size with greater than 50% respiratory variability, suggesting right atrial pressure of 3 mmHg. IAS/Shunts: No atrial level shunt detected by color flow Doppler.   LV Volumes (MOD) LV vol d, MOD A2C: 93.0 ml LV vol d, MOD A4C: 75.0 ml LV vol s, MOD A2C: 38.2 ml LV vol s, MOD A4C: 31.1 ml LV SV MOD A2C:     54.8 ml LV SV MOD A4C:     75.0 ml LV SV MOD BP:      49.1 ml RIGHT VENTRICLE           IVC RV Basal diam:  2.80 cm  IVC diam: 1.00 cm RV Mid diam:    2.20 cm LEFT ATRIUM             Index        RIGHT ATRIUM  Index LA Vol (A2C):   22.5 ml 14.71 ml/m  RA Area:     8.98 cm LA Vol (A4C):   33.0 ml 21.58 ml/m  RA Volume:   18.50 ml 12.10 ml/m LA Biplane Vol: 30.2 ml 19.75 ml/m Kardie Tobb DO Electronically signed by Dub Huntsman DO Signature Date/Time: 05/14/2024/8:00:12 AM    Final    VAS US  LOWER EXTREMITY VENOUS (DVT) Result Date: 05/13/2024  Lower Venous DVT Study Patient Name:  SHONICA WEIER  Date of Exam:   05/13/2024 Medical Rec #: [id number]        Accession #:    [id number] Date of Birth: [birth year]       Patient Gender: F Patient Age:   4 years Exam Location:  [facility] Procedure:      VAS US  LOWER EXTREMITY VENOUS (DVT) Referring Phys: ARY Sussan Meter --------------------------------------------------------------------------------  Indications: Stroke.  Comparison Study: No prior study on file Performing Technologist: Alberta Lis RVS  Examination Guidelines: A complete evaluation includes B-mode imaging, spectral Doppler, color Doppler, and power Doppler as needed of all accessible portions of each vessel. Bilateral testing is considered an integral part of a complete examination. Limited examinations for reoccurring indications may be performed as noted. The reflux portion of the exam is performed with the patient in reverse Trendelenburg.  +---------+---------------+---------+-----------+----------+--------------+ RIGHT    CompressibilityPhasicitySpontaneityPropertiesThrombus Aging +---------+---------------+---------+-----------+----------+--------------+ CFV      Full           Yes      Yes                                 +---------+---------------+---------+-----------+----------+--------------+ SFJ      Full                                                        +---------+---------------+---------+-----------+----------+--------------+  FV Prox  Full           Yes      Yes                                 +---------+---------------+---------+-----------+----------+--------------+ FV Mid   Full                                                        +---------+---------------+---------+-----------+----------+--------------+ FV DistalFull                                                        +---------+---------------+---------+-----------+----------+--------------+ PFV      Full           Yes      No                                  +---------+---------------+---------+-----------+----------+--------------+ POP      Full  Yes      Yes                                 +---------+---------------+---------+-----------+----------+--------------+ PTV      Full                                                        +---------+---------------+---------+-----------+----------+--------------+ PERO     Full                                                        +---------+---------------+---------+-----------+----------+--------------+   +---------+---------------+---------+-----------+----------+-------------------+ LEFT     CompressibilityPhasicitySpontaneityPropertiesThrombus Aging      +---------+---------------+---------+-----------+----------+-------------------+ CFV      Full           Yes      Yes                                      +---------+---------------+---------+-----------+----------+-------------------+ SFJ      Full                                                             +---------+---------------+---------+-----------+----------+-------------------+ FV Prox  Full                                                             +---------+---------------+---------+-----------+----------+-------------------+ FV Mid   Full                                                              +---------+---------------+---------+-----------+----------+-------------------+ FV DistalFull                                                             +---------+---------------+---------+-----------+----------+-------------------+ PFV      Full                                                             +---------+---------------+---------+-----------+----------+-------------------+ POP                     Yes  Yes                  patent by color and                                                       Doppler             +---------+---------------+---------+-----------+----------+-------------------+ PTV      Full                                                             +---------+---------------+---------+-----------+----------+-------------------+ PERO     Full                                                             +---------+---------------+---------+-----------+----------+-------------------+     Summary: BILATERAL: - No evidence of deep vein thrombosis seen in the lower extremities, bilaterally. -No evidence of popliteal cyst, bilaterally.   *See table(s) above for measurements and observations. Electronically signed by Debby Robertson on 05/13/2024 at 5:14:37 PM.    Final    CT ANGIO HEAD NECK W WO CM Result Date: 05/12/2024 EXAM: CTA HEAD AND NECK WITHOUT AND WITH 05/12/2024 07:25:25 PM TECHNIQUE: CTA of the head and neck was performed without and with the administration of 75 mL of iohexol (OMNIPAQUE) 350 MG/ML injection. Multiplanar 2D and/or 3D reformatted images are provided for review. Automated exposure control, iterative reconstruction, and/or weight based adjustment of the mA/kV was utilized to reduce the radiation dose to as low as reasonably achievable. Stenosis of the internal carotid arteries measured using NASCET criteria. COMPARISON: Comparison with prior CT and MRI from earlier the same day. CLINICAL HISTORY: Stroke/TIA, determine  embolic source. Pt presents today due to dizziness for the past 6 days. Pt was seen by her PCP 2 days ago and they performed blood work that was found to be unremarkable. Pt states that she is still experiencing dizziness, states that she feels when she stands from a sitting position it takes her a minute to catch her balance, pt denies room spinning. FINDINGS: LIMITATIONS: Examination degraded by motion artifact. CTA NECK: AORTIC ARCH AND ARCH VESSELS: Mild aortic atherosclerosis. No dissection or arterial injury. No significant stenosis of the brachiocephalic or subclavian arteries. CERVICAL CAROTID ARTERIES: No dissection, arterial injury, or hemodynamically significant stenosis by NASCET criteria. CERVICAL VERTEBRAL ARTERIES: Right vertebral artery dominant. No dissection, arterial injury, or significant stenosis. LUNGS AND MEDIASTINUM: Moderate layering bilateral pleural effusions, left larger than right. Mild pulmonary interstitial congestion within the visualized aerated portions of the lungs. SOFT TISSUES: No acute abnormality. BONES: Grade 1 degenerative anterolisthesis of C4 on C5. Moderate cervical spondylosis at C5-C6 and C6-C7. CTA HEAD: ANTERIOR CIRCULATION: No significant stenosis of the internal carotid arteries. No significant stenosis of the anterior cerebral arteries. No significant stenosis of the middle cerebral arteries. No aneurysm. POSTERIOR CIRCULATION: Right PCA supplied via the basilar as well as a prominent right posterior carotid artery. Fetal type  origin of the left PCA. No significant stenosis of the posterior cerebral arteries. No significant stenosis of the basilar artery. No significant stenosis of the vertebral arteries. No aneurysm. OTHER: No dural venous sinus thrombosis on this non-dedicated study. IMPRESSION: 1. Negative CTA with no large vessel occlusion or other emergent findings. Mild premature atheromatous disease without hemodynamically significant or correctable stenosis.  2. Moderate layering pleural effusions, left greater than right, with mild pulmonary interstitial congestion within the visualized lungs. Electronically signed by: Morene Hoard MD 05/12/2024 07:46 PM EDT RP Workstation: [id number]   MR BRAIN WO CONTRAST Result Date: 05/12/2024 EXAM: MRI BRAIN WITHOUT CONTRAST 05/12/2024 05:53:55 PM TECHNIQUE: Multiplanar multisequence MRI of the head/brain was performed without the administration of intravenous contrast. COMPARISON: Comparison with prior head CT from earlier the same day. CLINICAL HISTORY: FINDINGS: BRAIN AND VENTRICLES: No midline shift. No hydrocephalus. Patchy and confluent T2 FLAIR signal abnormality involving the periventricular and deep white matter of both cerebral hemispheres as well as the pons, consistent with chronic subvascular ischemic disease, advanced in nature. Few scattered remote lacunar infarcts about the hemispheric cerebral white matter. Few scattered remote cerebellar infarcts noted. Early subacute ischemic infarct involving the superior right cerebellum, right SCA distribution (series 5, image 13). Area of infarction measures up to 4.2 cm. Multiple additional scattered early subacute infarcts seen involving the right greater than left cortical to subcortical cerebral hemispheres. Few scattered foci of associated petechial blood products noted. No significant regional mass effect. Few scattered chronic microhemorrhages noted, likely small vessel hypertensive in nature. The sella is unremarkable. Normal flow voids. ORBITS: No acute abnormality. SINUSES AND MASTOIDS: No acute abnormality. BONES AND SOFT TISSUES: Normal marrow signal. 1.9 cm nodule lesion present at the left parietal scalp (series 8, image 25), indeterminate. IMPRESSION: 1. Early subacute infarct of the superior right cerebellum in the right SCA territory, with additional scattered early subacute infarcts involving the right greater than left cerebral hemispheres. Mild  associated petechial blood products without hemorrhagic transformation or significant regional mass effect. 2. Underlying advanced chronic small vessel ischemic disease with scattered remote lacunar and cerebellar infarcts. Electronically signed by: Morene Hoard MD 05/12/2024 06:09 PM EDT RP Workstation: [id number]   CT Head Wo Contrast Result Date: 05/12/2024 EXAM: CT HEAD WITHOUT CONTRAST 05/12/2024 01:56:16 PM TECHNIQUE: CT of the head was performed without the administration of intravenous contrast. Automated exposure control, iterative reconstruction, and/or weight based adjustment of the mA/kV was utilized to reduce the radiation dose to as low as reasonably achievable. COMPARISON: None available. CLINICAL HISTORY: Vertigo, peripheral. Pt BIB EMS from urgent care due to Hyponatremia. Pt c/o dizziness and weakness since Friday. FINDINGS: BRAIN AND VENTRICLES: No acute hemorrhage. No evidence of acute infarct. Old right cerebellar infarct. Subacute to chronic right superior cerebellar infarct. Remote infarct in the right occipital lobe and additional remote infarcts in the posterior lateral right temporal lobe. Small remote infarcts in the left cerebellum. There is likely an additional remote infarct in the left thalamus. Atherosclerosis of the carotid siphons. Moderate chronic microvascular ischemic change. No hydrocephalus. No extra-axial collection. No mass effect or midline shift. ORBITS: No acute abnormality. SINUSES: No acute abnormality. SOFT TISSUES AND SKULL: No acute soft tissue abnormality. No skull fracture. IMPRESSION: 1. Possible subacute infarct in the superior right cerebellum. Recommend MRI for further evaluation. 2. Additional remote infarcts in the right cerebellum, right occipital lobe, and posterolateral right temporal lobe. Small remote infarcts in the left cerebellum. Likely additional remote infarct in the left thalamus. 3. Moderate  chronic microvascular ischemic change.  Electronically signed by: Donnice Mania MD 05/12/2024 02:16 PM EDT RP Workstation: [id number]   DG Chest Port 1 View Result Date: 05/12/2024 CLINICAL DATA:  Dizziness and weakness. EXAM: PORTABLE CHEST 1 VIEW COMPARISON:  May 12, 2024 FINDINGS: The heart size and mediastinal contours are within normal limits. Mild, stable atelectasis and/or infiltrate is seen within the left lung base. Very mild atelectatic changes are also seen within the lateral aspect of the right lung base. There is a small, stable left pleural effusion. No pneumothorax is identified. The visualized skeletal structures are unremarkable. IMPRESSION: 1. Mild, stable left basilar atelectasis and/or infiltrate. 2. Very mild right basilar atelectasis. 3. Small, stable left pleural effusion. Electronically Signed   By: Suzen Dials M.D.   On: 05/12/2024 14:06   DG Chest 2 View Result Date: 05/12/2024 EXAM: 2 VIEW(S) XRAY OF THE CHEST 05/12/2024 11:07:25 AM COMPARISON: None available. CLINICAL HISTORY: dizziness. Pt c/o dizziness and weakness Onset 6 days ago Also st's her blood glucose is low FINDINGS: LUNGS AND PLEURA: Small left pleural effusion. Left basilar atelectasis. No focal pulmonary opacity. No pulmonary edema. No pneumothorax. HEART AND MEDIASTINUM: Calcified aorta. No acute abnormality of the cardiac and mediastinal silhouettes. BONES AND SOFT TISSUES: No acute osseous abnormality. IMPRESSION: 1. Small left pleural effusion with left basilar atelectasis. Electronically signed by: Oneil Devonshire MD 05/12/2024 11:29 AM EDT RP Workstation: [id number]     PHYSICAL EXAM  Temp:  [97.8 F (36.6 C)-99.4 F (37.4 C)] 98.5 F (36.9 C) (10/17 1147) Pulse Rate:  [78-101] 101 (10/17 1147) Resp:  [15-24] 19 (10/17 1147) BP: (127-170)/(63-89) 170/81 (10/17 1147) SpO2:  [93 %-99 %] 95 % (10/17 1147) Weight:  [50.8 kg] 50.8 kg (10/16 1605)  General - Well nourished, well developed, in no apparent distress.  Ophthalmologic -  fundi not visualized due to noncooperation.  Cardiovascular - Regular rhythm and rate.  Mental Status -  Level of arousal and orientation to time, place, and person were intact. Language including expression, naming, repetition, comprehension was assessed and found intact. Attention span and concentration were normal. But attempted WORLD backward spelling twice and right at the second time.  Fund of Knowledge was assessed and was intact.  Cranial Nerves II - XII - II - Visual field intact OU. III, IV, VI - Extraocular movements intact. V - Facial sensation intact bilaterally. VII - Facial movement intact bilaterally. VIII - Hearing & vestibular intact bilaterally. X - Palate elevates symmetrically. XI - Chin turning & shoulder shrug intact bilaterally. XII - Tongue protrusion intact.  Motor Strength - The patient's strength was normal in all extremities and pronator drift was absent.  Bulk was normal and fasciculations were absent.   Motor Tone - Muscle tone was assessed at the neck and appendages and was normal.  Reflexes - The patient's reflexes were symmetrical in all extremities and she had no pathological reflexes.  Sensory - Light touch, temperature/pinprick were assessed and were symmetrical.    Coordination - The patient had normal movements in left hand and foot with no ataxia or dysmetria. However, R FTN and HTS showed mild ataxia. Tremor was absent.  Gait and Station - deferred.   ASSESSMENT/PLAN [name] is a 78 y.o. female with history of HTN, HLD admitted for dizziness, ataxia and generalized weakness. No TNK given due to outside window.    Stroke:  multifocal bilateral infarcts, embolic pattern, secondary to cardioembolic source CT R cerebellar subacute infarct MRI  embolic shower  with small infarcts bilaterally including one at R SCA territory.  CTA head and neck unremarkable LE venous doppler no DVT 2D Echo EF 55-60%, but mobile echolucent mass on  the right coronary cusp which is likely calcification however in the setting of a stroke TEE will be beneficial for clarity.  Will consider outpt TEE given low likelihoood of cardiac source of emboli and pt eagerness to go home. Ziopatch has placed to rule out afib LDL 47 HgbA1c 5.5 Lovenox for VTE prophylaxis No antithrombotic prior to admission, now on aspirin 81 mg daily and clopidogrel 75 mg daily for 3 weeks and then ASA alone.  Patient counseled to be compliant with her antithrombotic medications Ongoing aggressive stroke risk factor management Therapy recommendations:  outpatient PT Disposition:  pending  Hypertension Stable Avoid low BP Long term BP goal normotensive  Hyperlipidemia Home meds:  lipitor 10  LDL 47, goal < 70 Now on lipitor 10 Continue statin at discharge  Other Stroke Risk Factors Advanced age  Other Active Problems Hyponatremia - likely chronic, Na 131  Hospital day # 2  Neurology will sign off. Please call with questions. Pt will follow up with stroke clinic NP at [facility] in about 4 weeks. Thanks for the consult.   Ary Cummins, MD PhD Stroke Neurology 05/14/2024 11:55 AM    To contact Stroke Continuity provider, please refer to [email or web address]. After hours, contact General Neurology

## 2024-05-15 DIAGNOSIS — E785 Hyperlipidemia, unspecified: Secondary | ICD-10-CM | POA: Diagnosis not present

## 2024-05-15 DIAGNOSIS — E871 Hypo-osmolality and hyponatremia: Secondary | ICD-10-CM | POA: Diagnosis not present

## 2024-05-15 DIAGNOSIS — I639 Cerebral infarction, unspecified: Secondary | ICD-10-CM | POA: Diagnosis not present

## 2024-05-15 NOTE — Progress Notes (Signed)
 [facility]                                                                               Michele Tyler, is a 78 y.o. female, DOB - 1945-08-21, FMW:[id number] Admit date - 05/12/2024    Outpatient Primary MD for the patient is [name], MD  LOS - 3  days    Brief summary   78 year old with past medical history significant for hypertension, hyperlipidemia presents with 5 days of dizziness, unsteadiness she was found to have subacute stroke.    Assessment & Plan    Assessment and Plan:   Cerebellar stroke.  Stroke work up done, neurology recommended aspirin and plavix followed by aspirin alone.  Therapy evaluations recommending 24/7 supervision and possibly SNF on discharge Discussed with cardiology TEE to be set up outpatient.    Hyponatremia from hydrochlorothiazide.  Monitor. Hold hydrochlorothiazide.    Hypertension Well controlled.  Continue with losartan  50 mg daily  Hyperlipidemia Continue with Lipitor 10 mg daily   IDA Continue with iron supplementations.     Estimated body mass index is 19.22 kg/m as calculated from the following:   Height as of this encounter: 5' 4 (1.626 m).   Weight as of this encounter: 50.8 kg.  Code Status: full code.  DVT Prophylaxis:  enoxaparin (LOVENOX) injection 40 mg Start: 05/12/24 2200   Level of Care: Level of care: Telemetry Medical Family Communication: None at bedside today  Disposition Plan:     Remains inpatient appropriate: medically stable for discharge.  Plan for SNF  Procedures:  Echocardiogram.   Consultants:   Neurology.   Antimicrobials:   Anti-infectives (From admission, onward)    None        Medications  Scheduled Meds:   stroke: early stages of recovery book   Does not apply Once   aspirin EC  81 mg Oral Daily   atorvastatin   10 mg Oral QHS   buPROPion   150 mg Oral Daily   busPIRone   7.5 mg Oral TID   cholecalciferol  5,000 Units Oral Q breakfast    clopidogrel  75 mg Oral Daily   enoxaparin (LOVENOX) injection  40 mg Subcutaneous Q24H   feeding supplement  237 mL Oral BID BM   ferrous sulfate  325 mg Oral Q breakfast   folic acid   1,000 mcg Oral Daily   losartan   50 mg Oral Daily   Continuous Infusions: PRN Meds:.acetaminophen **OR** acetaminophen, ondansetron  (ZOFRAN ) IV, senna-docusate    Subjective:   Michele Tyler was seen and examined today.  No new complaints at this time  Objective:   Vitals:   05/15/24 0417 05/15/24 0828 05/15/24 1231 05/15/24 1610  BP: 118/68 (!) 158/88 (!) 142/83 (!) 141/83  Pulse: 91 90 (!) 110 92  Resp: 20 19 19  (!) 25  Temp: 98 F (36.7 C) 97.6 F (36.4 C) 98.5 F (36.9 C) 98.3 F (36.8 C)  TempSrc: Oral   Oral  SpO2: 97% 96% 99% 99%  Weight:      Height:        Intake/Output Summary (Last 24 hours) at 05/15/2024 1809 Last data filed at 05/14/2024 2145 Gross per  24 hour  Intake 240 ml  Output --  Net 240 ml   Filed Weights   05/13/24 1605  Weight: 50.8 kg     Exam General exam: Appears calm and comfortable  Respiratory system: Clear to auscultation. Respiratory effort normal. Cardiovascular system: S1 & S2 heard, RRR. Gastrointestinal system: Abdomen is nondistended, soft and nontender.  Central nervous system: Alert and oriented.  Extremities: Symmetric 5 x 5 power. Skin: No rashes,  Psychiatry: Mood & affect appropriate.    Data Reviewed:  I have personally reviewed following labs and imaging studies   CBC Lab Results  Component Value Date   WBC 7.5 05/13/2024   RBC 4.13 05/13/2024   HGB 11.5 (L) 05/13/2024   HCT 35.2 (L) 05/13/2024   MCV 85.2 05/13/2024   MCH 27.8 05/13/2024   PLT 439 (H) 05/13/2024   MCHC 32.7 05/13/2024   RDW 12.9 05/13/2024   LYMPHSABS 0.5 (L) 05/12/2024   MONOABS 0.7 05/12/2024   EOSABS 0.0 05/12/2024   BASOSABS 0.0 05/12/2024     Last metabolic panel Lab Results  Component Value Date   NA 131 (L) 05/14/2024   K 4.0  05/14/2024   CL 96 (L) 05/14/2024   CO2 26 05/14/2024   BUN 10 05/14/2024   CREATININE 0.67 05/14/2024   GLUCOSE 92 05/14/2024   GFRNONAA >60 05/14/2024   GFRAA 106 07/05/2020   CALCIUM  8.4 (L) 05/14/2024   PROT 7.2 05/12/2024   ALBUMIN 4.5 05/12/2024   LABGLOB 2.2 05/10/2024   AGRATIO 2.1 01/03/2023   BILITOT 0.4 05/12/2024   ALKPHOS 94 05/12/2024   AST 44 (H) 05/12/2024   ALT 18 05/12/2024   ANIONGAP 9 05/14/2024    CBG (last 3)  No results for input(s): GLUCAP in the last 72 hours.    Coagulation Profile: No results for input(s): INR, PROTIME in the last 168 hours.   Radiology Studies: No results found.      Elgie Butter M.D. [facility] 05/15/2024, 6:09 PM  Available via Epic secure chat 7am-7pm After 7 pm, please refer to night coverage provider listed on amion.

## 2024-05-15 NOTE — Progress Notes (Signed)
 Physical Therapy Treatment Patient Details Name: Michele Tyler MRN: [id number] DOB: 04-Jun-1946 Today's Date: 05/15/2024   History of Present Illness 78 y.o. female presents [facility]/13/24 with 5 days of dizziness/unsteadiness. 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 PMHx: hypertension, hyperlipidemia   PT Comments  Pt received in supine and agreeable to PT session. Pt presents with decreased safety awareness and impaired cognition. Pt was unable to remember conversations had earlier in the session with difficulty problem solving to return back to her room. Pt was able to ambulate short distances with CGA and RW and negotiate 15 steps with one handrail and RW. More assistance needed for cueing for technique and for safety than for physical assist. Pt would need 24/7 assist at home to assist with medication management, taking care of six dogs, and to safely navigate in the home. If pt is unable to have assist, pt would then benefit from <3hrs post acute rehab. Pt is going to be discussing with friends if assist is available or if she would need to get further rehab prior to d/c home, TOC following. Acute PT to continue to follow acutely to work towards acute PT goals.     If plan is discharge home, recommend the following: A little help with walking and/or transfers;A little help with bathing/dressing/bathroom;Assist for transportation;Help with stairs or ramp for entrance   Can travel by private vehicle      Yes  Equipment Recommendations  Rolling walker (2 wheels)       Precautions / Restrictions Precautions Precautions: Fall Recall of Precautions/Restrictions: Intact Restrictions Weight Bearing Restrictions Per Provider Order: No     Mobility  Bed Mobility Overal bed mobility: Needs Assistance Bed Mobility: Supine to Sit, Sit to Supine    Supine to sit: Supervision Sit to supine:  Supervision   General bed mobility comments: for safety    Transfers Overall transfer level: Needs assistance Equipment used: None, Rolling walker (2 wheels) Transfers: Sit to/from Stand Sit to Stand: Contact guard assist    General transfer comment: cues for hand placement when using RW    Ambulation/Gait Ambulation/Gait assistance: Contact guard assist Gait Distance (Feet): 100 Feet (x5) Assistive device: Rolling walker (2 wheels) Gait Pattern/deviations: Step-through pattern, Decreased stride length, Drifts right/left   Gait velocity interpretation: >4.37 ft/sec, indicative of normal walking speed   General Gait Details: steady with RW with pt drifting right/left   Stairs Stairs: Yes Stairs assistance: Contact guard assist Stair Management: One rail Right, Step to pattern, Sideways, Forwards, With walker Number of Stairs: 15 General stair comments: frequent cueing for technique with pt able to fold RW and carry with B hands through hands with sideways approach as well as holding RW to the side with one hand on rail and forward approach   Modified Rankin (Stroke Patients Only) Modified Rankin (Stroke Patients Only) Pre-Morbid Rankin Score: No symptoms Modified Rankin: Moderately severe disability     Balance Overall balance assessment: Needs assistance Sitting-balance support: No upper extremity supported, Feet supported Sitting balance-Leahy Scale: Good Sitting balance - Comments: seated on EOB   Standing balance support: Bilateral upper extremity supported, During functional activity, Reliant on assistive device for balance Standing balance-Leahy Scale: Poor Standing balance comment: reliant on UE and external support       Communication Communication Communication: No apparent difficulties  Cognition Arousal: Alert Behavior During Therapy: WFL for tasks assessed/performed   PT - Cognitive impairments:  No family/caregiver present to determine baseline     PT - Cognition Comments: A&Ox4, however, difficulty remembering earlier conversations and navigating back to the room Following commands: Intact      Cueing Cueing Techniques: Verbal cues     General Comments General comments (skin integrity, edema, etc.): VSS on RA      Pertinent Vitals/Pain Pain Assessment Pain Assessment: No/denies pain     PT Goals (current goals can now be found in the care plan section) Acute Rehab PT Goals PT Goal Formulation: With patient Time For Goal Achievement: 05/28/24 Potential to Achieve Goals: Good Progress towards PT goals: Progressing toward goals    Frequency    Min 2X/week       AM-PAC PT 6 Clicks Mobility   Outcome Measure  Help needed turning from your back to your side while in a flat bed without using bedrails?: A Little Help needed moving from lying on your back to sitting on the side of a flat bed without using bedrails?: A Little Help needed moving to and from a bed to a chair (including a wheelchair)?: A Little Help needed standing up from a chair using your arms (e.g., wheelchair or bedside chair)?: A Little Help needed to walk in hospital room?: A Little Help needed climbing 3-5 steps with a railing? : A Little 6 Click Score: 18    End of Session Equipment Utilized During Treatment: Gait belt Activity Tolerance: Patient tolerated treatment well Patient left: in bed;with call bell/phone within reach;with bed alarm set Nurse Communication: Mobility status PT Visit Diagnosis: Unsteadiness on feet (R26.81);Other abnormalities of gait and mobility (R26.89);Muscle weakness (generalized) (M62.81)     Time: 9241-9174 PT Time Calculation (min) (ACUTE ONLY): 27 min  Charges:    $Gait Training: 8-22 mins $Therapeutic Activity: 8-22 mins PT General Charges $$ ACUTE PT VISIT: 1 Visit                    Kate ORN, PT, DPT Secure Chat Preferred  Rehab Office [phone]   Kate BRAVO [name] 05/15/2024, 8:31 AM

## 2024-05-15 NOTE — Progress Notes (Signed)
 Occupational Therapy Treatment Patient Details Name: Michele Tyler MRN: [id number] DOB: 09-08-1945 Today's Date: 05/15/2024   History of present illness 78 y.o. female presents to [facility] 05/13/24 with 5 days of dizziness/unsteadiness. 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 PMHx: hypertension, hyperlipidemia   OT comments  Patient seated on EOB and eager to participate with OT. Patient demonstrating good gains with OT treatment with functional transfers, standing at sink for grooming tasks, and LB dressing.  Patient performed path finding tasks in hallway with verbal cues to use environmental cues.  Patient will benefit from continued inpatient follow up therapy, <3 hours/day unless patient is able to have family and/or friends to provided 24/7 supervision then possible for patient to return home with HHOT or OPOT.  Acute OT to continue to follow to address established goals to facilitate DC to next venue of care.        If plan is discharge home, recommend the following:  A little help with walking and/or transfers;A little help with bathing/dressing/bathroom;Assistance with cooking/housework;Direct supervision/assist for medications management;Direct supervision/assist for financial management;Assist for transportation;Help with stairs or ramp for entrance;Supervision due to cognitive status   Equipment Recommendations  None recommended by OT    Recommendations for Other Services      Precautions / Restrictions Precautions Precautions: Fall Recall of Precautions/Restrictions: Intact Restrictions Weight Bearing Restrictions Per Provider Order: No       Mobility Bed Mobility Overal bed mobility: Needs Assistance             General bed mobility comments: seated on EOB upon entry and left on EOB for lunch    Transfers Overall transfer level: Needs assistance Equipment used: Rolling  walker (2 wheels) Transfers: Sit to/from Stand Sit to Stand: Contact guard assist           General transfer comment: cues for hand placement and CGA for safety     Balance Overall balance assessment: Needs assistance Sitting-balance support: No upper extremity supported, Feet supported Sitting balance-Leahy Scale: Good Sitting balance - Comments: seated on EOB   Standing balance support: Single extremity supported, Bilateral upper extremity supported, During functional activity Standing balance-Leahy Scale: Poor Standing balance comment: reliant on external support when standing                           ADL either performed or assessed with clinical judgement   ADL Overall ADL's : Needs assistance/impaired Eating/Feeding: Set up;Sitting Eating/Feeding Details (indicate cue type and reason): on EOB Grooming: Wash/dry hands;Wash/dry face;Contact guard assist;Standing;Cueing for sequencing Grooming Details (indicate cue type and reason): cues for sequencing             Lower Body Dressing: Contact guard assist;Sit to/from stand Lower Body Dressing Details (indicate cue type and reason): able to thread legs into clothing and CGA to pullup Toilet Transfer: Contact guard assist;Ambulation;Regular Toilet;Rolling walker (2 wheels)   Toileting- Clothing Manipulation and Hygiene: Supervision/safety;Sitting/lateral lean       Functional mobility during ADLs: Contact guard assist;Cueing for sequencing;Cueing for safety;Rolling walker (2 wheels) General ADL Comments: cognitive cues during self care tasks    Extremity/Trunk Assessment              Vision       Perception     Praxis     Communication Communication Communication: No apparent difficulties   Cognition Arousal: Alert Behavior During Therapy:  WFL for tasks assessed/performed Cognition: Cognition impaired     Awareness: Online awareness impaired, Intellectual awareness impaired Memory  impairment (select all impairments): Short-term memory Attention impairment (select first level of impairment): Sustained attention Executive functioning impairment (select all impairments): Reasoning, Problem solving, Organization OT - Cognition Comments: required cues for path finding tasks and cues for sequencing with self care tasks                 Following commands: Intact        Cueing   Cueing Techniques: Verbal cues  Exercises      Shoulder Instructions       General Comments VSS on RA    Pertinent Vitals/ Pain       Pain Assessment Pain Assessment: No/denies pain  Home Living                                          Prior Functioning/Environment              Frequency  Min 2X/week        Progress Toward Goals  OT Goals(current goals can now be found in the care plan section)  Progress towards OT goals: Progressing toward goals  Acute Rehab OT Goals Patient Stated Goal: to go home OT Goal Formulation: With patient Time For Goal Achievement: 05/28/24 Potential to Achieve Goals: Good ADL Goals Pt Will Perform Lower Body Bathing: with modified independence;sitting/lateral leans;sit to/from stand Pt Will Perform Lower Body Dressing: with modified independence;sit to/from stand;sitting/lateral leans Pt Will Transfer to Toilet: with modified independence;ambulating;regular height toilet Pt Will Perform Toileting - Clothing Manipulation and hygiene: with modified independence;sitting/lateral leans;sit to/from stand Additional ADL Goal #1: Patient will be able to demonstrate increased cognitive awareness to be able to complete ADLs or IADLs without prompting or assist.  Plan      Co-evaluation                 AM-PAC OT 6 Clicks Daily Activity     Outcome Measure   Help from another person eating meals?: A Little Help from another person taking care of personal grooming?: A Little Help from another person toileting,  which includes using toliet, bedpan, or urinal?: A Little Help from another person bathing (including washing, rinsing, drying)?: A Little Help from another person to put on and taking off regular upper body clothing?: A Little Help from another person to put on and taking off regular lower body clothing?: A Little 6 Click Score: 18    End of Session Equipment Utilized During Treatment: Gait belt;Rolling walker (2 wheels)  OT Visit Diagnosis: Unsteadiness on feet (R26.81);Other abnormalities of gait and mobility (R26.89);Muscle weakness (generalized) (M62.81);Other symptoms and signs involving cognitive function   Activity Tolerance Patient tolerated treatment well   Patient Left in bed;with call bell/phone within reach;with family/visitor present   Nurse Communication Mobility status        Time: 8887-8868 OT Time Calculation (min): 19 min  Charges: OT General Charges $OT Visit: 1 Visit OT Treatments $Self Care/Home Management : 8-22 mins  Dick Laine, OTA Acute Rehabilitation Services  Office [phone]   Jeb LITTIE Laine 05/15/2024, 2:00 PM

## 2024-05-16 DIAGNOSIS — E785 Hyperlipidemia, unspecified: Secondary | ICD-10-CM | POA: Diagnosis not present

## 2024-05-16 DIAGNOSIS — I639 Cerebral infarction, unspecified: Secondary | ICD-10-CM | POA: Diagnosis not present

## 2024-05-16 DIAGNOSIS — E871 Hypo-osmolality and hyponatremia: Secondary | ICD-10-CM | POA: Diagnosis not present

## 2024-05-16 LAB — BASIC METABOLIC PANEL WITH GFR
Anion gap: 10 (ref 5–15)
BUN: 12 mg/dL (ref 8–23)
CO2: 25 mmol/L (ref 22–32)
Calcium: 8.4 mg/dL — ABNORMAL LOW (ref 8.9–10.3)
Chloride: 94 mmol/L — ABNORMAL LOW (ref 98–111)
Creatinine, Ser: 0.67 mg/dL (ref 0.44–1.00)
GFR, Estimated: >60 mL/min (ref >60)
Glucose, Bld: 98 mg/dL (ref 70–99)
Potassium: 4.3 mmol/L (ref 3.5–5.1)
Sodium: 129 mmol/L — ABNORMAL LOW (ref 135–145)

## 2024-05-16 LAB — OSMOLALITY, URINE: Osmolality, Ur: 453 mosm/kg (ref 300–900)

## 2024-05-16 LAB — SODIUM, URINE, RANDOM: Sodium, Ur: 102 mmol/L

## 2024-05-16 MED ORDER — GUAIFENESIN 100 MG/5ML PO LIQD
10.0000 mL | ORAL | Status: DC | PRN
  Administered 2024-05-16 – 2024-05-19 (×7): 10 mL via ORAL
  Filled 2024-05-16 (×7): qty 10

## 2024-05-16 MED ORDER — ORAL CARE MOUTH RINSE: 15.0000 mL | OROMUCOSAL | Status: DC | PRN

## 2024-05-16 NOTE — Progress Notes (Signed)
 [facility]                                                                               Michele Tyler, is a 78 y.o. female, DOB - Nov 06, 1945, FMW:[id number] Admit date - 05/12/2024    Outpatient Primary MD for the patient is [name], MD  LOS - 4  days    Brief summary   78 year old with past medical history significant for hypertension, hyperlipidemia presents with 5 days of dizziness, unsteadiness she was found to have subacute stroke.    Assessment & Plan    Assessment and Plan:   Cerebellar stroke.  Stroke work up done, neurology recommended aspirin and plavix followed by aspirin alone.  Therapy evaluations recommending 24/7 supervision and possibly SNF on discharge Discussed with cardiology TEE to be set up outpatient.  Waiting for SNF.    Hyponatremia from hydrochlorothiazide.  Monitor. Hold hydrochlorothiazide.  Check am cortisol, serum osmolality.  Urine osmo, urine sodium .TSH is wnl.   Hypertension Well controlled.  Continue with losartan  50 mg daily  Hyperlipidemia Continue with Lipitor 10 mg daily   IDA Continue with iron supplementations.     Estimated body mass index is 19.22 kg/m as calculated from the following:   Height as of this encounter: 5' 4 (1.626 m).   Weight as of this encounter: 50.8 kg.  Code Status: full code.  DVT Prophylaxis:  enoxaparin (LOVENOX) injection 40 mg Start: 05/12/24 2200   Level of Care: Level of care: Med-Surg Family Communication: None at bedside today  Disposition Plan:     Remains inpatient appropriate: medically stable for discharge.  Plan for SNF  Procedures:  Echocardiogram.   Consultants:   Neurology.   Antimicrobials:   Anti-infectives (From admission, onward)    None        Medications  Scheduled Meds:   stroke: early stages of recovery book   Does not apply Once   aspirin EC  81 mg Oral Daily   atorvastatin   10 mg Oral QHS   buPROPion   150 mg Oral Daily    busPIRone   7.5 mg Oral TID   cholecalciferol  5,000 Units Oral Q breakfast   clopidogrel  75 mg Oral Daily   enoxaparin (LOVENOX) injection  40 mg Subcutaneous Q24H   feeding supplement  237 mL Oral BID BM   ferrous sulfate  325 mg Oral Q breakfast   folic acid   1,000 mcg Oral Daily   losartan   50 mg Oral Daily   Continuous Infusions: PRN Meds:.acetaminophen **OR** acetaminophen, ondansetron  (ZOFRAN ) IV, mouth rinse, senna-docusate    Subjective:   Michele Tyler was seen and examined today. Some cough.   Objective:   Vitals:   05/15/24 2025 05/16/24 0049 05/16/24 0402 05/16/24 0907  BP: 128/69 (!) 161/69 (!) 141/91 (!) 149/82  Pulse: (!) 104 (!) 102 92 95  Resp:  (!) 25  19  Temp: 98 F (36.7 C) 97.7 F (36.5 C) 97.6 F (36.4 C) 97.9 F (36.6 C)  TempSrc: Oral Oral Axillary Oral  SpO2: 96% 97% 95% 98%  Weight:      Height:       No intake  or output data in the 24 hours ending 05/16/24 1618  Filed Weights   05/13/24 1605  Weight: 50.8 kg     Exam General exam: Appears calm and comfortable  Respiratory system: Clear to auscultation. Respiratory effort normal. Cardiovascular system: S1 & S2 heard, RRR. No JVD Gastrointestinal system: Abdomen is nondistended, soft and nontender. Central nervous system: Alert and oriented. No focal neurological deficits. Extremities: Symmetric 5 x 5 power. Skin: No rashes,     Data Reviewed:  I have personally reviewed following labs and imaging studies   CBC Lab Results  Component Value Date   WBC 7.5 05/13/2024   RBC 4.13 05/13/2024   HGB 11.5 (L) 05/13/2024   HCT 35.2 (L) 05/13/2024   MCV 85.2 05/13/2024   MCH 27.8 05/13/2024   PLT 439 (H) 05/13/2024   MCHC 32.7 05/13/2024   RDW 12.9 05/13/2024   LYMPHSABS 0.5 (L) 05/12/2024   MONOABS 0.7 05/12/2024   EOSABS 0.0 05/12/2024   BASOSABS 0.0 05/12/2024     Last metabolic panel Lab Results  Component Value Date   NA 129 (L) 05/16/2024   K 4.3 05/16/2024    CL 94 (L) 05/16/2024   CO2 25 05/16/2024   BUN 12 05/16/2024   CREATININE 0.67 05/16/2024   GLUCOSE 98 05/16/2024   GFRNONAA >60 05/16/2024   GFRAA 106 07/05/2020   CALCIUM  8.4 (L) 05/16/2024   PROT 7.2 05/12/2024   ALBUMIN 4.5 05/12/2024   LABGLOB 2.2 05/10/2024   AGRATIO 2.1 01/03/2023   BILITOT 0.4 05/12/2024   ALKPHOS 94 05/12/2024   AST 44 (H) 05/12/2024   ALT 18 05/12/2024   ANIONGAP 10 05/16/2024    CBG (last 3)  No results for input(s): GLUCAP in the last 72 hours.    Coagulation Profile: No results for input(s): INR, PROTIME in the last 168 hours.   Radiology Studies: No results found.      Elgie Butter M.D. [facility] 05/16/2024, 4:18 PM  Available via Epic secure chat 7am-7pm After 7 pm, please refer to night coverage provider listed on amion.

## 2024-05-16 NOTE — Plan of Care (Signed)

## 2024-05-16 NOTE — Plan of Care (Signed)
 Problem: Education: Goal: Knowledge of disease or condition will improve 05/16/2024 0506 by Cleotilde Deanie HERO, RN Outcome: Progressing 05/16/2024 0506 by Cleotilde Deanie HERO, RN Outcome: Progressing Goal: Knowledge of secondary prevention will improve (MUST DOCUMENT ALL) 05/16/2024 0506 by Cleotilde Deanie HERO, RN Outcome: Progressing 05/16/2024 0506 by Cleotilde Deanie HERO, RN Outcome: Progressing Goal: Knowledge of patient specific risk factors will improve (DELETE if not current risk factor) 05/16/2024 0506 by Cleotilde Deanie HERO, RN Outcome: Progressing 05/16/2024 0506 by Cleotilde Deanie HERO, RN Outcome: Progressing   Problem: Ischemic Stroke/TIA Tissue Perfusion: Goal: Complications of ischemic stroke/TIA will be minimized 05/16/2024 0506 by Cleotilde Deanie HERO, RN Outcome: Progressing 05/16/2024 0506 by Cleotilde Deanie HERO, RN Outcome: Progressing   Problem: Coping: Goal: Will verbalize positive feelings about self 05/16/2024 0506 by Cleotilde Deanie HERO, RN Outcome: Progressing 05/16/2024 0506 by Cleotilde Deanie HERO, RN Outcome: Progressing Goal: Will identify appropriate support needs 05/16/2024 0506 by Cleotilde Deanie HERO, RN Outcome: Progressing 05/16/2024 0506 by Cleotilde Deanie HERO, RN Outcome: Progressing   Problem: Health Behavior/Discharge Planning: Goal: Ability to manage health-related needs will improve 05/16/2024 0506 by Cleotilde Deanie HERO, RN Outcome: Progressing 05/16/2024 0506 by Cleotilde Deanie HERO, RN Outcome: Progressing Goal: Goals will be collaboratively established with patient/family 05/16/2024 0506 by Cleotilde Deanie HERO, RN Outcome: Progressing 05/16/2024 0506 by Cleotilde Deanie HERO, RN Outcome: Progressing   Problem: Self-Care: Goal: Ability to participate in self-care as condition permits will improve 05/16/2024 0506 by Cleotilde Deanie HERO, RN Outcome: Progressing 05/16/2024 0506 by Cleotilde Deanie HERO, RN Outcome: Progressing Goal: Verbalization of feelings and concerns over  difficulty with self-care will improve 05/16/2024 0506 by Cleotilde Deanie HERO, RN Outcome: Progressing 05/16/2024 0506 by Cleotilde Deanie HERO, RN Outcome: Progressing Goal: Ability to communicate needs accurately will improve 05/16/2024 0506 by Cleotilde Deanie HERO, RN Outcome: Progressing 05/16/2024 0506 by Cleotilde Deanie HERO, RN Outcome: Progressing   Problem: Nutrition: Goal: Risk of aspiration will decrease 05/16/2024 0506 by Cleotilde Deanie HERO, RN Outcome: Progressing 05/16/2024 0506 by Cleotilde Deanie HERO, RN Outcome: Progressing Goal: Dietary intake will improve 05/16/2024 0506 by Cleotilde Deanie HERO, RN Outcome: Progressing 05/16/2024 0506 by Cleotilde Deanie HERO, RN Outcome: Progressing   Problem: Education: Goal: Knowledge of General Education information will improve Description: Including pain rating scale, medication(s)/side effects and non-pharmacologic comfort measures 05/16/2024 0506 by Cleotilde Deanie HERO, RN Outcome: Progressing 05/16/2024 0506 by Cleotilde Deanie HERO, RN Outcome: Progressing   Problem: Health Behavior/Discharge Planning: Goal: Ability to manage health-related needs will improve 05/16/2024 0506 by Cleotilde Deanie HERO, RN Outcome: Progressing 05/16/2024 0506 by Cleotilde Deanie HERO, RN Outcome: Progressing   Problem: Clinical Measurements: Goal: Ability to maintain clinical measurements within normal limits will improve 05/16/2024 0506 by Cleotilde Deanie HERO, RN Outcome: Progressing 05/16/2024 0506 by Cleotilde Deanie HERO, RN Outcome: Progressing Goal: Will remain free from infection 05/16/2024 0506 by Cleotilde Deanie HERO, RN Outcome: Progressing 05/16/2024 0506 by Cleotilde Deanie HERO, RN Outcome: Progressing Goal: Diagnostic test results will improve 05/16/2024 0506 by Cleotilde Deanie HERO, RN Outcome: Progressing 05/16/2024 0506 by Cleotilde Deanie HERO, RN Outcome: Progressing Goal: Respiratory complications will improve 05/16/2024 0506 by Cleotilde Deanie HERO, RN Outcome:  Progressing 05/16/2024 0506 by Cleotilde Deanie HERO, RN Outcome: Progressing Goal: Cardiovascular complication will be avoided 05/16/2024 0506 by Cleotilde Deanie HERO, RN Outcome: Progressing 05/16/2024 0506 by Cleotilde Deanie HERO, RN Outcome: Progressing   Problem: Activity: Goal: Risk for activity intolerance will decrease 05/16/2024 0506 by Cleotilde Deanie HERO, RN Outcome: Progressing 05/16/2024 0506 by  Cleotilde Deanie HERO, RN Outcome: Progressing   Problem: Nutrition: Goal: Adequate nutrition will be maintained 05/16/2024 0506 by Cleotilde Deanie HERO, RN Outcome: Progressing 05/16/2024 0506 by Cleotilde Deanie HERO, RN Outcome: Progressing   Problem: Coping: Goal: Level of anxiety will decrease 05/16/2024 0506 by Cleotilde Deanie HERO, RN Outcome: Progressing 05/16/2024 0506 by Cleotilde Deanie HERO, RN Outcome: Progressing   Problem: Elimination: Goal: Will not experience complications related to bowel motility 05/16/2024 0506 by Cleotilde Deanie HERO, RN Outcome: Progressing 05/16/2024 0506 by Cleotilde Deanie HERO, RN Outcome: Progressing Goal: Will not experience complications related to urinary retention 05/16/2024 0506 by Cleotilde Deanie HERO, RN Outcome: Progressing 05/16/2024 0506 by Cleotilde Deanie HERO, RN Outcome: Progressing   Problem: Pain Managment: Goal: General experience of comfort will improve and/or be controlled 05/16/2024 0506 by Cleotilde Deanie HERO, RN Outcome: Progressing 05/16/2024 0506 by Cleotilde Deanie HERO, RN Outcome: Progressing   Problem: Safety: Goal: Ability to remain free from injury will improve 05/16/2024 0506 by Cleotilde Deanie HERO, RN Outcome: Progressing 05/16/2024 0506 by Cleotilde Deanie HERO, RN Outcome: Progressing   Problem: Skin Integrity: Goal: Risk for impaired skin integrity will decrease 05/16/2024 0506 by Cleotilde Deanie HERO, RN Outcome: Progressing 05/16/2024 0506 by Cleotilde Deanie HERO, RN Outcome: Progressing

## 2024-05-16 NOTE — Plan of Care (Signed)
   Problem: Coping: Goal: Will verbalize positive feelings about self Outcome: Progressing

## 2024-05-17 DIAGNOSIS — I639 Cerebral infarction, unspecified: Secondary | ICD-10-CM | POA: Diagnosis not present

## 2024-05-17 DIAGNOSIS — E785 Hyperlipidemia, unspecified: Secondary | ICD-10-CM | POA: Diagnosis not present

## 2024-05-17 DIAGNOSIS — E871 Hypo-osmolality and hyponatremia: Secondary | ICD-10-CM | POA: Diagnosis not present

## 2024-05-17 LAB — OSMOLALITY: Osmolality: 265 mosm/kg — ABNORMAL LOW (ref 275–295)

## 2024-05-17 LAB — SODIUM: Sodium: 129 mmol/L — ABNORMAL LOW (ref 135–145)

## 2024-05-17 LAB — CORTISOL-AM, BLOOD: Cortisol - AM: 17.9 ug/dL (ref 6.7–22.6)

## 2024-05-17 NOTE — Plan of Care (Signed)

## 2024-05-17 NOTE — NC FL2 (Signed)
 [place]  MEDICAID FL2 LEVEL OF CARE FORM     IDENTIFICATION  Patient Name: Michele Tyler Birthdate: 1946/04/19 Sex: female Admission Date (Current Location): 05/12/2024  [facility] and [place] Number:  Producer, television/film/video and Address:  [facility] [place], [address] [place], [place] [address]      Provider Number: [id number]  Attending Physician Name and Address:  Cherlyn Labella, MD  Relative Name and Phone Number:       Current Level of Care: Hospital Recommended Level of Care: Skilled Nursing Facility Prior Approval Number:    Date Approved/Denied:   PASRR Number: [id number]  Discharge Plan: SNF    Current Diagnoses: Patient Active Problem List   Diagnosis Date Noted   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   Dizziness 05/11/2024   Nausea 04/12/2024   Hyponatremia 01/06/2024   Urinary and fecal incontinence 11/27/2023   Itchy scalp 09/05/2023   Iron deficiency 07/04/2019   B12 deficiency 07/04/2019   Lipoma 07/04/2019   Fear of other medical care- signficant fears of drug S-E inhibiting pt's ability to take medications 07/02/2019   Insomnia 03/25/2018   Hyperlipidemia 03/25/2018   Hypertension, goal below 150/90 02/18/2018   Mood disorder 02/18/2018   Generalized anxiety disorder 03/19/2013    Orientation RESPIRATION BLADDER Height & Weight     Self, Time, Situation, Place  Normal Continent Weight: 111 lb 15.9 oz (50.8 kg) Height:  5' 4 (162.6 cm)  BEHAVIORAL SYMPTOMS/MOOD NEUROLOGICAL BOWEL NUTRITION STATUS      Continent Diet (heart healthy)  AMBULATORY STATUS COMMUNICATION OF NEEDS Skin   Limited Assist Verbally Normal                       Personal Care Assistance Level of Assistance  Bathing, Feeding, Dressing Bathing Assistance: Limited assistance Feeding assistance: Independent Dressing Assistance: Limited assistance     Functional Limitations Info  Sight Sight Info: Impaired        SPECIAL CARE FACTORS  FREQUENCY  PT (By licensed PT), OT (By licensed OT)     PT Frequency: 5x/wk OT Frequency: 5x/wk            Contractures Contractures Info: Not present    Additional Factors Info  Code Status, Allergies, Psychotropic Code Status Info: Full Allergies Info: Codeine Psychotropic Info: Wellbutrin  XL 150mg  daily; Buspar  7.5mg  3x/day         Current Medications (05/17/2024):  This is the current hospital active medication list Current Facility-Administered Medications  Medication Dose Route Frequency Provider Last Rate Last Admin    stroke: early stages of recovery book   Does not apply Once Akula, Vijaya, MD       acetaminophen (TYLENOL) tablet 650 mg  650 mg Oral Q6H PRN Akula, Vijaya, MD       Or   acetaminophen (TYLENOL) suppository 650 mg  650 mg Rectal Q6H PRN Akula, Vijaya, MD       aspirin EC tablet 81 mg  81 mg Oral Daily Akula, Vijaya, MD   81 mg at 05/17/24 1013   atorvastatin  (LIPITOR) tablet 10 mg  10 mg Oral QHS Akula, Vijaya, MD   10 mg at 05/16/24 2203   buPROPion  (WELLBUTRIN  XL) 24 hr tablet 150 mg  150 mg Oral Daily Akula, Vijaya, MD   150 mg at 05/15/24 9080   busPIRone  (BUSPAR ) tablet 7.5 mg  7.5 mg Oral TID Akula, Vijaya, MD   7.5 mg at 05/17/24 1021   cholecalciferol (VITAMIN D3)  25 MCG (1000 UNIT) tablet 5,000 Units  5,000 Units Oral Q breakfast Akula, Vijaya, MD   5,000 Units at 05/17/24 0847   clopidogrel (PLAVIX) tablet 75 mg  75 mg Oral Daily Akula, Vijaya, MD   75 mg at 05/17/24 1013   enoxaparin (LOVENOX) injection 40 mg  40 mg Subcutaneous Q24H Akula, Vijaya, MD   40 mg at 05/16/24 2203   feeding supplement (ENSURE PLUS HIGH PROTEIN) liquid 237 mL  237 mL Oral BID BM Akula, Vijaya, MD   237 mL at 05/17/24 1022   ferrous sulfate tablet 325 mg  325 mg Oral Q breakfast Akula, Vijaya, MD   325 mg at 05/17/24 0847   folic acid  (FOLVITE ) tablet 1 mg  1,000 mcg Oral Daily Akula, Vijaya, MD   1 mg at 05/17/24 1013   guaiFENesin (ROBITUSSIN) 100 MG/5ML liquid 10  mL  10 mL Oral Q4H PRN Cherlyn Labella, MD   10 mL at 05/17/24 0306   losartan  (COZAAR ) tablet 50 mg  50 mg Oral Daily Akula, Vijaya, MD   50 mg at 05/17/24 1013   ondansetron  (ZOFRAN ) injection 4 mg  4 mg Intravenous Q6H PRN Akula, Vijaya, MD   4 mg at 05/15/24 2034   Oral care mouth rinse  15 mL Mouth Rinse PRN Cherlyn Labella, MD       senna-docusate (Senokot-S) tablet 1 tablet  1 tablet Oral QHS PRN Akula, Vijaya, MD         Discharge Medications: Please see discharge summary for a list of discharge medications.  Relevant Imaging Results:  Relevant Lab Results:   Additional Information SS#: [id number]  Almarie CHRISTELLA Goodie, LCSW

## 2024-05-17 NOTE — TOC Progression Note (Signed)
 Transition of Care [facility]) - Progression Note    Patient Details  Name: Michele Tyler MRN: [id number] Date of Birth: 1945-10-18  [facility]) CM/SW Contact  Andrez JULIANNA George, RN Phone Number: 05/17/2024, 10:44 AM  Clinical Narrative:     CM discussed with pt that insurance may not approve a rehab stay as she is physically doing well. She asked to be faxed out to see if insurance will approve. CSW updated.   Expected Discharge Plan: OP Rehab Barriers to Discharge: Continued Medical Work up               Expected Discharge Plan and Services   Discharge Planning Services: CM Consult Post Acute Care Choice: Durable Medical Equipment Living arrangements for the past 2 months: Single Family Home Expected Discharge Date: 05/14/24               DME Arranged: Vannie rolling DME Agency: [employer] Date DME Agency Contacted: 05/14/24   Representative spoke with at DME Agency: [place]             Social Drivers of Health (SDOH) Interventions SDOH Screenings   Food Insecurity: No Food Insecurity (05/13/2024)  Housing: Low Risk  (05/13/2024)  Transportation Needs: No Transportation Needs (05/13/2024)  Utilities: Not At Risk (05/13/2024)  Alcohol Screen: Low Risk  (08/04/2023)  Depression (PHQ2-9): High Risk (04/08/2024)  Financial Resource Strain: Low Risk  (08/04/2023)  Physical Activity: Sufficiently Active (08/04/2023)  Social Connections: Moderately Isolated (05/13/2024)  Stress: Stress Concern Present (08/04/2023)  Tobacco Use: Medium Risk (05/12/2024)    Readmission Risk Interventions     No data to display

## 2024-05-17 NOTE — Progress Notes (Signed)
 [facility]                                                                               Michele Tyler, is a 78 y.o. female, DOB - 06/04/1946, FMW:[id number] Admit date - 05/12/2024    Outpatient Primary MD for the patient is [name], MD  LOS - 5  days    Brief summary   78 year old with past medical history significant for hypertension, hyperlipidemia presents with 5 days of dizziness, unsteadiness she was found to have subacute stroke.    Assessment & Plan    Assessment and Plan:   Cerebellar stroke.  Stroke work up done, neurology recommended aspirin and plavix followed by aspirin alone.  Therapy evaluations recommending 24/7 supervision and possibly SNF on discharge Discussed with cardiology TEE to be set up outpatient.  Waiting for SNF.    Hyponatremia from hydrochlorothiazide.  Asymptomatic. Hold hydrochlorothiazide.  Cortisol is adequate. Serum osmo is 265, urine sodium is 102.  SABRATSH is wnl.   Hypertension Well controlled.  Continue with losartan  50 mg daily  Hyperlipidemia Continue with Lipitor 10 mg daily   IDA Continue with iron supplementations.     Estimated body mass index is 19.22 kg/m as calculated from the following:   Height as of this encounter: 5' 4 (1.626 m).   Weight as of this encounter: 50.8 kg.  Code Status: full code.  DVT Prophylaxis:  enoxaparin (LOVENOX) injection 40 mg Start: 05/12/24 2200   Level of Care: Level of care: Med-Surg Family Communication: None at bedside today  Disposition Plan:     Remains inpatient appropriate: medically stable for discharge.  Plan for SNF  Procedures:  Echocardiogram.   Consultants:   Neurology.   Antimicrobials:   Anti-infectives (From admission, onward)    None        Medications  Scheduled Meds:   stroke: early stages of recovery book   Does not apply Once   aspirin EC  81 mg Oral Daily   atorvastatin   10 mg Oral QHS   buPROPion   150 mg Oral  Daily   busPIRone   7.5 mg Oral TID   cholecalciferol  5,000 Units Oral Q breakfast   clopidogrel  75 mg Oral Daily   enoxaparin (LOVENOX) injection  40 mg Subcutaneous Q24H   feeding supplement  237 mL Oral BID BM   ferrous sulfate  325 mg Oral Q breakfast   folic acid   1,000 mcg Oral Daily   losartan   50 mg Oral Daily   Continuous Infusions: PRN Meds:.acetaminophen **OR** acetaminophen, guaiFENesin, ondansetron  (ZOFRAN ) IV, mouth rinse, senna-docusate    Subjective:   Michele Tyler was seen and examined today. No new complaints  Objective:   Vitals:   05/17/24 0027 05/17/24 0302 05/17/24 0731 05/17/24 1124  BP: (!) 163/81 (!) 148/78 (!) 160/80 (!) 155/77  Pulse:  82 91 87  Resp:  18 20   Temp: 98.3 F (36.8 C) 98.7 F (37.1 C) 98.2 F (36.8 C) 98.1 F (36.7 C)  TempSrc: Oral Oral Oral Oral  SpO2: 97% 98% 100% 98%  Weight:      Height:  Intake/Output Summary (Last 24 hours) at 05/17/2024 1807 Last data filed at 05/17/2024 0900 Gross per 24 hour  Intake 240 ml  Output --  Net 240 ml    Filed Weights   05/13/24 1605  Weight: 50.8 kg     Exam General exam: Appears calm and comfortable  Respiratory system: Clear to auscultation. Respiratory effort normal. Cardiovascular system: S1 & S2 heard, RRR. No JVD,  Gastrointestinal system: Abdomen is nondistended, soft and nontender.  Central nervous system: Alert and oriented.  Extremities: Symmetric 5 x 5 power. Skin: No rashes, Psychiatry:  Mood & affect appropriate.      Data Reviewed:  I have personally reviewed following labs and imaging studies   CBC Lab Results  Component Value Date   WBC 7.5 05/13/2024   RBC 4.13 05/13/2024   HGB 11.5 (L) 05/13/2024   HCT 35.2 (L) 05/13/2024   MCV 85.2 05/13/2024   MCH 27.8 05/13/2024   PLT 439 (H) 05/13/2024   MCHC 32.7 05/13/2024   RDW 12.9 05/13/2024   LYMPHSABS 0.5 (L) 05/12/2024   MONOABS 0.7 05/12/2024   EOSABS 0.0 05/12/2024   BASOSABS 0.0  05/12/2024     Last metabolic panel Lab Results  Component Value Date   NA 129 (L) 05/17/2024   K 4.3 05/16/2024   CL 94 (L) 05/16/2024   CO2 25 05/16/2024   BUN 12 05/16/2024   CREATININE 0.67 05/16/2024   GLUCOSE 98 05/16/2024   GFRNONAA >60 05/16/2024   GFRAA 106 07/05/2020   CALCIUM  8.4 (L) 05/16/2024   PROT 7.2 05/12/2024   ALBUMIN 4.5 05/12/2024   LABGLOB 2.2 05/10/2024   AGRATIO 2.1 01/03/2023   BILITOT 0.4 05/12/2024   ALKPHOS 94 05/12/2024   AST 44 (H) 05/12/2024   ALT 18 05/12/2024   ANIONGAP 10 05/16/2024    CBG (last 3)  No results for input(s): GLUCAP in the last 72 hours.    Coagulation Profile: No results for input(s): INR, PROTIME in the last 168 hours.   Radiology Studies: No results found.      Elgie Butter M.D. [facility] 05/17/2024, 6:07 PM  Available via Epic secure chat 7am-7pm After 7 pm, please refer to night coverage provider listed on amion.

## 2024-05-17 NOTE — Progress Notes (Signed)
   05/17/24 1117  Spiritual Encounters  Type of Visit Initial  Care provided to: Patient  Referral source Clinical staff  Reason for visit Advance directives  OnCall Visit No  Spiritual Framework  Presenting Themes Impactful experiences and emotions  Community/Connection Friend(s)  Patient Stress Factors Health changes  Family Stress Factors Health changes  Interventions  Spiritual Care Interventions Made Compassionate presence;Established relationship of care and support;Normalization of emotions  Intervention Outcomes  Outcomes Awareness of health;Awareness of support;Reduced anxiety  Mental Health Advance Directives  Would patient like information on creating a mental health advance directive? No - Patient declined   Chaplain met with Pt after being informed by friends outside the room that they were possibly interested in completing a healthcare POA on behalf of the Pt.  Chaplain explain that completion of an AD must be initiated by the Pt's own desire and consent.  Chaplain entered the room and introduced self to the Pt. Pt stated that she did not need anything AD services at this time. During conversation, Pt shared that she was been managing some fears and anxiety related to her condition and care.  She expressed a desire to go home but stated she is willing to participate in rehabilitation. Chaplain affirmed her decision, provided supportive listening, and reminder her that chaplain services remain available as needed.

## 2024-05-17 NOTE — Progress Notes (Signed)
 Occupational Therapy Treatment Patient Details Name: Michele Tyler MRN: [id number] DOB: [birth year] Today's Date: 05/17/2024   History of present illness 78 y.o. female presents to [facility] 05/13/24 with 5 days of dizziness/unsteadiness. 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 PMHx: hypertension, hyperlipidemia   OT comments  Patient seen for further cognitive assessments. Patient attempting to order lunch upon OT arrival. Patient using her Iphone, and requiring increased time to orient screen as she was attempting to use her phone by tapping the back of her case. Patient unable to navigate to the phone icon, and requiring step by step cues to dial number, with one error and having to reattempt (did not read numbers out correctly). Patient then attempting to order checks through her bank, requiring step by step cues to dial, and unable to provide routing number correctly despite assist. Patient's friends entering the room, therefore task handed off to them. Patient continues to require 24/7 supervision, as patient is at high risk for polypharmacy, and other safety hazards (patient also has 6 dogs at home) if she were to discharge home without supervision. OT will continue to follow acutely, with short term rehab recommended.       If plan is discharge home, recommend the following:  A little help with walking and/or transfers;A little help with bathing/dressing/bathroom;Assistance with cooking/housework;Direct supervision/assist for medications management;Direct supervision/assist for financial management;Assist for transportation;Help with stairs or ramp for entrance;Supervision due to cognitive status   Equipment Recommendations  None recommended by OT    Recommendations for Other Services      Precautions / Restrictions Precautions Precautions: Fall Recall of Precautions/Restrictions:  Impaired Restrictions Weight Bearing Restrictions Per Provider Order: No       Mobility Bed Mobility Overal bed mobility: Needs Assistance             General bed mobility comments: seated on EOB upon entry and left on EOB, unaware she was leaning and the bottom of the bed was elevated    Transfers Overall transfer level: Needs assistance                 General transfer comment: session focused on cognition     Balance Overall balance assessment: Needs assistance Sitting-balance support: No upper extremity supported, Feet supported Sitting balance-Leahy Scale: Good Sitting balance - Comments: seated on EOB                                   ADL either performed or assessed with clinical judgement   ADL Overall ADL's : Needs assistance/impaired                                     Functional mobility during ADLs: Contact guard assist;Cueing for sequencing;Cueing for safety;Rolling walker (2 wheels) General ADL Comments: Patient seen for further cognitive assessments. Patient attempting to order lunch upon OT arrival. Patient using her Iphone, and requiring increased time to orient screen as she was attempting to use her phone by tapping the back of her case. Patient unable to navigate to the phone icon, and requiring step by step cues to dial number, with one error and having to reattempt (did not read numbers out correctly). Patient then attempting to order checks through her bank, requiring step by step cues to dial,  and unable to provide routing number correctly despite assist. Patient's friends entering the room, therefore task handed off to them. Patient continues to require 24/7 supervision, as patient is at high risk for polypharmacy, and other safety hazards (patient also has 6 dogs at home) if she were to discharge home. OT will continue to follow acutely, with short term rehab recommended.    Extremity/Trunk Assessment               Vision       Perception Perception Perception: Impaired Preception Impairment Details: Figure ground   Praxis Praxis Praxis: Impaired Praxis Impairment Details: Organization   Communication Communication Communication: No apparent difficulties   Cognition Arousal: Alert Behavior During Therapy: Flat affect Cognition: Cognition impaired   Orientation impairments: Situation Awareness: Online awareness impaired, Intellectual awareness impaired Memory impairment (select all impairments): Short-term memory Attention impairment (select first level of impairment): Sustained attention Executive functioning impairment (select all impairments): Reasoning, Problem solving, Organization OT - Cognition Comments: unable to manage phone without step by step assist, disorganized sequencing with all tasks, no insight into deficits                 Following commands: Impaired Following commands impaired: Follows multi-step commands inconsistently      Cueing   Cueing Techniques: Verbal cues  Exercises      Shoulder Instructions       General Comments      Pertinent Vitals/ Pain       Pain Assessment Pain Assessment: No/denies pain  Home Living                                          Prior Functioning/Environment              Frequency  Min 2X/week        Progress Toward Goals  OT Goals(current goals can now be found in the care plan section)  Progress towards OT goals: Progressing toward goals  Acute Rehab OT Goals Patient Stated Goal: to get my checks ordered OT Goal Formulation: With patient Time For Goal Achievement: 05/28/24 Potential to Achieve Goals: Fair  Plan      Co-evaluation                 AM-PAC OT 6 Clicks Daily Activity     Outcome Measure   Help from another person eating meals?: A Little Help from another person taking care of personal grooming?: A Little Help from another person toileting, which  includes using toliet, bedpan, or urinal?: A Little Help from another person bathing (including washing, rinsing, drying)?: A Little Help from another person to put on and taking off regular upper body clothing?: A Little Help from another person to put on and taking off regular lower body clothing?: A Little 6 Click Score: 18    End of Session    OT Visit Diagnosis: Unsteadiness on feet (R26.81);Other abnormalities of gait and mobility (R26.89);Muscle weakness (generalized) (M62.81);Other symptoms and signs involving cognitive function   Activity Tolerance Patient tolerated treatment well   Patient Left in bed;with call bell/phone within reach;with family/visitor present;with bed alarm set   Nurse Communication Mobility status        Time: 8955-8892 OT Time Calculation (min): 23 min  Charges: OT General Charges $OT Visit: 1 Visit OT Treatments $Self Care/Home Management : 23-37 mins  Ronal Gift E.  Lemmie Steinhaus, OTR/L Acute Rehabilitation Services [phone]   Ronal Mallie Salt 05/17/2024, 11:29 AM

## 2024-05-17 NOTE — Care Management Important Message (Signed)
 Important Message  Patient Details  Name: Michele Tyler MRN: [id number] Date of Birth: 1946/06/28   Important Message Given:  Yes - Medicare IM     Claretta Deed 05/17/2024, 3:42 PM

## 2024-05-18 ENCOUNTER — Other Ambulatory Visit: Payer: Self-pay | Admitting: Family Medicine

## 2024-05-18 DIAGNOSIS — E785 Hyperlipidemia, unspecified: Secondary | ICD-10-CM | POA: Diagnosis not present

## 2024-05-18 DIAGNOSIS — E871 Hypo-osmolality and hyponatremia: Secondary | ICD-10-CM | POA: Diagnosis not present

## 2024-05-18 DIAGNOSIS — I639 Cerebral infarction, unspecified: Secondary | ICD-10-CM | POA: Diagnosis not present

## 2024-05-18 MED ORDER — LOSARTAN POTASSIUM 50 MG PO TABS
100.0000 mg | ORAL_TABLET | Freq: Every day | ORAL | Status: DC
  Administered 2024-05-19 – 2024-05-20 (×2): 100 mg via ORAL
  Filled 2024-05-18 (×2): qty 2

## 2024-05-18 NOTE — Progress Notes (Signed)
 Physical Therapy Treatment Patient Details Name: Michele Tyler MRN: [id number] DOB: August 10, 1945 Today's Date: 05/18/2024   History of Present Illness 78 y.o. female presents to [facility] 05/13/24 with 5 days of dizziness/unsteadiness. 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 PMHx: hypertension, hyperlipidemia    PT Comments  Pt received sitting EOB and agreeable to session. Pt able to perform gait trial without AD with intermittent min A for balance during challenges due to increased instability. Pt able to complete stair trial and step ups with CGA-min A for steadying. Pt demonstrates impaired memory with pt asking the same question multiple times within a few minutes. Pt continues to benefit from PT services to progress toward functional mobility goals.     If plan is discharge home, recommend the following: A little help with walking and/or transfers;A little help with bathing/dressing/bathroom;Assist for transportation;Help with stairs or ramp for entrance   Can travel by private vehicle        Equipment Recommendations  Rolling walker (2 wheels)    Recommendations for Other Services       Precautions / Restrictions Precautions Precautions: Fall Recall of Precautions/Restrictions: Impaired Restrictions Weight Bearing Restrictions Per Provider Order: No     Mobility  Bed Mobility               General bed mobility comments: Pt sitting EOB upon entry and in recliner at end of session    Transfers Overall transfer level: Needs assistance Equipment used: None Transfers: Sit to/from Stand Sit to Stand: Contact guard assist           General transfer comment: CGA for safety    Ambulation/Gait Ambulation/Gait assistance: Contact guard assist, Min assist Gait Distance (Feet): 75 Feet (+250) Assistive device: None Gait Pattern/deviations: Step-through pattern, Decreased stride  length, Drifts right/left       General Gait Details: Pt demonstrates stiff gait with low foot clearance. Some unsteadiness but no overt LOB without challenges. Intermittent min A for steadying with dual tasks, especially head turns. Cues for obstacle negotiation and awareness   Stairs Stairs: Yes Stairs assistance: Contact guard assist, Min assist Stair Management: One rail Right, Step to pattern, Forwards, Alternating pattern, No rails Number of Stairs: 2 (x2) General stair comments: cues for safety and sequencing. min A for balance without UE support   Wheelchair Mobility     Tilt Bed    Modified Rankin (Stroke Patients Only) Modified Rankin (Stroke Patients Only) Pre-Morbid Rankin Score: No symptoms Modified Rankin: Moderately severe disability     Balance Overall balance assessment: Needs assistance Sitting-balance support: No upper extremity supported, Feet supported Sitting balance-Leahy Scale: Good Sitting balance - Comments: seated on EOB   Standing balance support: During functional activity, No upper extremity supported Standing balance-Leahy Scale: Fair Standing balance comment: without AD and intermittent min A with dual tasking                 Standardized Balance Assessment Standardized Balance Assessment : Dynamic Gait Index   Dynamic Gait Index Level Surface: Mild Impairment Change in Gait Speed: Mild Impairment Gait with Horizontal Head Turns: Moderate Impairment Gait with Vertical Head Turns: Moderate Impairment Gait and Pivot Turn: Mild Impairment Step Over Obstacle: Moderate Impairment Step Around Obstacles: Mild Impairment Steps: Mild Impairment Total Score: 13      Communication Communication Communication: No apparent difficulties  Cognition Arousal: Alert Behavior During Therapy: Flat affect   PT -  Cognitive impairments: Awareness, Safety/Judgement, Problem solving                       PT - Cognition Comments: Pt  repeating questions multiple times Following commands: Impaired Following commands impaired: Follows multi-step commands inconsistently    Cueing Cueing Techniques: Verbal cues  Exercises Other Exercises Other Exercises: BLE step ups x10 each    General Comments        Pertinent Vitals/Pain Pain Assessment Pain Assessment: No/denies pain     PT Goals (current goals can now be found in the care plan section) Acute Rehab PT Goals Patient Stated Goal: to work on balance PT Goal Formulation: With patient Time For Goal Achievement: 05/28/24 Progress towards PT goals: Progressing toward goals    Frequency    Min 2X/week       AM-PAC PT 6 Clicks Mobility   Outcome Measure  Help needed turning from your back to your side while in a flat bed without using bedrails?: A Little Help needed moving from lying on your back to sitting on the side of a flat bed without using bedrails?: A Little Help needed moving to and from a bed to a chair (including a wheelchair)?: A Little Help needed standing up from a chair using your arms (e.g., wheelchair or bedside chair)?: A Little Help needed to walk in hospital room?: A Little Help needed climbing 3-5 steps with a railing? : A Little 6 Click Score: 18    End of Session Equipment Utilized During Treatment: Gait belt Activity Tolerance: Patient tolerated treatment well Patient left: with call bell/phone within reach;in chair;with chair alarm set Nurse Communication: Mobility status PT Visit Diagnosis: Unsteadiness on feet (R26.81);Other abnormalities of gait and mobility (R26.89);Muscle weakness (generalized) (M62.81)     Time: 9155-9083 PT Time Calculation (min) (ACUTE ONLY): 32 min  Charges:    $Gait Training: 23-37 mins PT General Charges $$ ACUTE PT VISIT: 1 Visit                    Darryle George, PTA Acute Rehabilitation Services Secure Chat Preferred  [phone]    Darryle George 05/18/2024, 9:36  AM

## 2024-05-18 NOTE — Progress Notes (Signed)
 [facility]                                                                               Michele Tyler, is a 78 y.o. female, DOB - 04-05-1946, FMW:[id number] Admit date - 05/12/2024    Outpatient Primary MD for the patient is [name], MD  LOS - 6  days    Brief summary   78 year old with past medical history significant for hypertension, hyperlipidemia presents with 5 days of dizziness, unsteadiness she was found to have subacute stroke.  Plan for SNF when bed available.    Assessment & Plan    Assessment and Plan:   Cerebellar stroke.  Stroke work up done, neurology recommended aspirin and plavix followed by aspirin alone.  Therapy evaluations recommending 24/7 supervision and possibly SNF on discharge Discussed with cardiology TEE to be set up outpatient.  Waiting for SNF.    Hyponatremia from hydrochlorothiazide.  Asymptomatic. Hold hydrochlorothiazide.  Cortisol is adequate. Serum osmo is 265, urine sodium is 102.  SABRATSH is wnl. Sodium around 129. Recheck in am.    Hypertension BP parameters are slightly elevated, increase losartan  to 100 mg daily.    Hyperlipidemia Continue with Lipitor 10 mg daily   IDA Continue with iron supplementations.     Estimated body mass index is 19.22 kg/m as calculated from the following:   Height as of this encounter: 5' 4 (1.626 m).   Weight as of this encounter: 50.8 kg.  Code Status: full code.  DVT Prophylaxis:  enoxaparin (LOVENOX) injection 40 mg Start: 05/12/24 2200   Level of Care: Level of care: Med-Surg Family Communication: None at bedside today  Disposition Plan:     Remains inpatient appropriate: medically stable for discharge.  Plan for SNF  Procedures:  Echocardiogram.   Consultants:   Neurology.   Antimicrobials:   Anti-infectives (From admission, onward)    None        Medications  Scheduled Meds:   stroke: early stages of recovery book   Does not apply  Once   aspirin EC  81 mg Oral Daily   atorvastatin   10 mg Oral QHS   buPROPion   150 mg Oral Daily   busPIRone   7.5 mg Oral TID   cholecalciferol  5,000 Units Oral Q breakfast   clopidogrel  75 mg Oral Daily   enoxaparin (LOVENOX) injection  40 mg Subcutaneous Q24H   feeding supplement  237 mL Oral BID BM   ferrous sulfate  325 mg Oral Q breakfast   folic acid   1,000 mcg Oral Daily   losartan   50 mg Oral Daily   Continuous Infusions: PRN Meds:.acetaminophen **OR** acetaminophen, guaiFENesin, ondansetron  (ZOFRAN ) IV, mouth rinse, senna-docusate    Subjective:   Michele Tyler was seen and examined today.  No new complaints.   Objective:   Vitals:   05/18/24 0403 05/18/24 0734 05/18/24 1136 05/18/24 1536  BP: (!) 151/85 (!) 155/77 (!) 140/77 (!) 156/81  Pulse: 93 100 98 (!) 109  Resp: 14 20 16 14   Temp: 98.1 F (36.7 C) 97.6 F (36.4 C) 97.9 F (36.6 C) 98.2 F (36.8 C)  TempSrc: Oral Oral Oral Oral  SpO2: 99% 93% 97% 98%  Weight:      Height:        Intake/Output Summary (Last 24 hours) at 05/18/2024 1656 Last data filed at 05/18/2024 1300 Gross per 24 hour  Intake 580 ml  Output --  Net 580 ml    Filed Weights   05/13/24 1605  Weight: 50.8 kg     Exam General exam: Appears calm and comfortable  Respiratory system: Clear to auscultation. Respiratory effort normal. Cardiovascular system: S1 & S2 heard, RRR.  Gastrointestinal system: Abdomen is nondistended, soft and nontender. Central nervous system: Alert and oriented. No focal neurological deficits. Extremities: Symmetric 5 x 5 power. Skin: No rashes, lesions or ulcers Psychiatry: Judgement and insight appear normal. Mood & affect appropriate.       Data Reviewed:  I have personally reviewed following labs and imaging studies   CBC Lab Results  Component Value Date   WBC 7.5 05/13/2024   RBC 4.13 05/13/2024   HGB 11.5 (L) 05/13/2024   HCT 35.2 (L) 05/13/2024   MCV 85.2 05/13/2024   MCH  27.8 05/13/2024   PLT 439 (H) 05/13/2024   MCHC 32.7 05/13/2024   RDW 12.9 05/13/2024   LYMPHSABS 0.5 (L) 05/12/2024   MONOABS 0.7 05/12/2024   EOSABS 0.0 05/12/2024   BASOSABS 0.0 05/12/2024     Last metabolic panel Lab Results  Component Value Date   NA 129 (L) 05/17/2024   K 4.3 05/16/2024   CL 94 (L) 05/16/2024   CO2 25 05/16/2024   BUN 12 05/16/2024   CREATININE 0.67 05/16/2024   GLUCOSE 98 05/16/2024   GFRNONAA >60 05/16/2024   GFRAA 106 07/05/2020   CALCIUM  8.4 (L) 05/16/2024   PROT 7.2 05/12/2024   ALBUMIN 4.5 05/12/2024   LABGLOB 2.2 05/10/2024   AGRATIO 2.1 01/03/2023   BILITOT 0.4 05/12/2024   ALKPHOS 94 05/12/2024   AST 44 (H) 05/12/2024   ALT 18 05/12/2024   ANIONGAP 10 05/16/2024    CBG (last 3)  No results for input(s): GLUCAP in the last 72 hours.    Coagulation Profile: No results for input(s): INR, PROTIME in the last 168 hours.   Radiology Studies: No results found.      Elgie Butter M.D. [facility] 05/18/2024, 4:56 PM  Available via Epic secure chat 7am-7pm After 7 pm, please refer to night coverage provider listed on amion.

## 2024-05-18 NOTE — TOC Progression Note (Signed)
 Transition of Care [facility]) - Progression Note    Patient Details  Name: Michele Tyler MRN: [id number] Date of Birth: 1946-05-03  [facility] of [facility] CM/SW Contact  Almarie CHRISTELLA Goodie, [place] Phone Number: 05/18/2024, 3:00 PM  Clinical Narrative:   CSW following for disposition. Insurance authorization remains pending at this time. CSW to follow.    Expected Discharge Plan: Skilled Nursing Facility Barriers to Discharge: Continued Medical Work up               Expected Discharge Plan and Services   Discharge Planning Services: CM Consult Post Acute Care Choice: Durable Medical Equipment Living arrangements for the past 2 months: Single Family Home Expected Discharge Date: 05/14/24               DME Arranged: Vannie rolling DME Agency: [employer] Date DME Agency Contacted: 05/14/24   Representative spoke with at DME Agency: [place]             Social Drivers of Health (SDOH) Interventions SDOH Screenings   Food Insecurity: No Food Insecurity (05/13/2024)  Housing: Low Risk  (05/13/2024)  Transportation Needs: No Transportation Needs (05/13/2024)  Utilities: Not At Risk (05/13/2024)  Alcohol Screen: Low Risk  (08/04/2023)  Depression (PHQ2-9): High Risk (04/08/2024)  Financial Resource Strain: Low Risk  (08/04/2023)  Physical Activity: Sufficiently Active (08/04/2023)  Social Connections: Moderately Isolated (05/13/2024)  Stress: Stress Concern Present (08/04/2023)  Tobacco Use: Medium Risk (05/12/2024)    Readmission Risk Interventions     No data to display

## 2024-05-19 DIAGNOSIS — I639 Cerebral infarction, unspecified: Secondary | ICD-10-CM | POA: Diagnosis not present

## 2024-05-19 LAB — OSMOLALITY, URINE: Osmolality, Ur: 508 mosm/kg (ref 300–900)

## 2024-05-19 LAB — SODIUM: Sodium: 128 mmol/L — ABNORMAL LOW (ref 135–145)

## 2024-05-19 LAB — SODIUM, URINE, RANDOM: Sodium, Ur: 50 mmol/L

## 2024-05-19 MED ORDER — SODIUM CHLORIDE 1 G PO TABS
1.0000 g | ORAL_TABLET | Freq: Three times a day (TID) | ORAL | Status: DC
  Administered 2024-05-19 – 2024-05-20 (×3): 1 g via ORAL
  Filled 2024-05-19 (×3): qty 1

## 2024-05-19 NOTE — Plan of Care (Signed)
 Alert and oriented but does become confused and has to be redirected and reoriented.  Has been up and walking in the hallway with assistance.  Gait slightly unsteady but uses a walker to assist her.    Problem: Education: Goal: Knowledge of disease or condition will improve Outcome: Progressing   Problem: Education: Goal: Knowledge of secondary prevention will improve (MUST DOCUMENT ALL) Outcome: Progressing   Problem: Education: Goal: Knowledge of patient specific risk factors will improve (DELETE if not current risk factor) Outcome: Progressing   P Problem: Health Behavior/Discharge Planning: Goal: Ability to manage health-related needs will improve Outcome: Progressing   Problem: Clinical Measurements: Goal: Will remain free from infection Outcome: Progressing   Problem: Clinical Measurements: Goal: Cardiovascular complication will be avoided Outcome: Progressing   Problem: Activity: Goal: Risk for activity intolerance will decrease Outcome: Progressing

## 2024-05-19 NOTE — Progress Notes (Signed)
 Occupational Therapy Treatment Patient Details Name: Michele Tyler MRN: [id number] DOB: November 26, 1945 Today's Date: 05/19/2024   History of present illness 78 y.o. female presents to [facility] 05/13/24 with 5 days of dizziness/unsteadiness. 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 PMHx: hypertension, hyperlipidemia   OT comments  Pt making steady progress towards OT goals this session. Pt continues to present with impaired cognition impacting safety with ADLs. Session focus on challenging attention and memory( see cog section). Overall, pt completes functional ambulation with RW and CGA but requires assistance for higher level IADL tasks. Patient will benefit from continued inpatient follow up therapy, <3 hours/day        If plan is discharge home, recommend the following:  A little help with walking and/or transfers;A little help with bathing/dressing/bathroom;Assistance with cooking/housework;Direct supervision/assist for medications management;Direct supervision/assist for financial management;Assist for transportation;Help with stairs or ramp for entrance;Supervision due to cognitive status   Equipment Recommendations       Recommendations for Other Services      Precautions / Restrictions Precautions Precautions: Fall Recall of Precautions/Restrictions: Impaired Restrictions Weight Bearing Restrictions Per Provider Order: No       Mobility Bed Mobility               General bed mobility comments: sitting EOB    Transfers Overall transfer level: Needs assistance Equipment used: None, Rolling walker (2 wheels) Transfers: Sit to/from Stand Sit to Stand: Contact guard assist, Supervision           General transfer comment: CGA- supervision for safety with and without RW     Balance Overall balance assessment: Needs assistance Sitting-balance support: No upper extremity  supported, Feet supported Sitting balance-Leahy Scale: Good     Standing balance support: During functional activity, No upper extremity supported Standing balance-Leahy Scale: Fair Standing balance comment: pt able to stand at sink to complete standing tolerance task with no UE support with supervision and no LOB                           ADL either performed or assessed with clinical judgement   ADL Overall ADL's : Needs assistance/impaired                         Toilet Transfer: Ambulation;Rolling walker (2 wheels);Contact guard assist Toilet Transfer Details (indicate cue type and reason): simulated via functional mobility         Functional mobility during ADLs: Contact guard assist;Rolling walker (2 wheels) General ADL Comments: ADL participation impacted by impaired cognition    Extremity/Trunk Assessment Upper Extremity Assessment Upper Extremity Assessment: Overall WFL for tasks assessed   Lower Extremity Assessment Lower Extremity Assessment: Defer to PT evaluation   Cervical / Trunk Assessment Cervical / Trunk Assessment: Normal    Vision Baseline Vision/History: 1 Wears glasses Ability to See in Adequate Light: 0 Adequate Patient Visual Report: No change from baseline     Perception Perception Perception: Within Functional Limits   Praxis Praxis Praxis: WFL   Communication Communication Communication: No apparent difficulties   Cognition Arousal: Alert Behavior During Therapy: WFL for tasks assessed/performed Cognition: Cognition impaired   Orientation impairments: Time (disoriented to day of week) Awareness: Online awareness impaired   Attention impairment (select first level of impairment): Selective attention Executive functioning impairment (select all impairments): Organization, Sequencing, Reasoning, Problem solving  OT -  Cognition Comments: pt greeted EOB attempting to make a phone call, pt had no awareness to how to  make a phone call. max multimodal cues provided with pt instructed to teach back with pt still needing MAX A to complete phone call. pt engaged in working memory task with pt instructed to write a grocery list and a to-do list. therapist them erased 3 irems from list with pt intsructed to recall which items were erased, pt completed task with 100% accuracy. pt able to demonstrate alternating attention task with pt instructed to walk in hallway while stating animal names in relation to correlating animal in alphabet. pt completed task with 90% accuracy.                 Following commands: Intact        Cueing   Cueing Techniques: Verbal cues, Tactile cues  Exercises      Shoulder Instructions       General Comments pts friend louise present intermittently during session    Pertinent Vitals/ Pain       Pain Assessment Pain Assessment: No/denies pain  Home Living                                          Prior Functioning/Environment              Frequency  Min 2X/week        Progress Toward Goals  OT Goals(current goals can now be found in the care plan section)  Progress towards OT goals: Progressing toward goals  Acute Rehab OT Goals Patient Stated Goal: to go to rehab and then go home OT Goal Formulation: With patient Time For Goal Achievement: 05/28/24 Potential to Achieve Goals: Fair  Plan      Co-evaluation                 AM-PAC OT 6 Clicks Daily Activity     Outcome Measure   Help from another person eating meals?: None Help from another person taking care of personal grooming?: A Little Help from another person toileting, which includes using toliet, bedpan, or urinal?: A Little Help from another person bathing (including washing, rinsing, drying)?: A Little Help from another person to put on and taking off regular upper body clothing?: None Help from another person to put on and taking off regular lower body  clothing?: A Little 6 Click Score: 20    End of Session Equipment Utilized During Treatment: Gait belt;Rolling walker (2 wheels)  OT Visit Diagnosis: Unsteadiness on feet (R26.81);Other abnormalities of gait and mobility (R26.89);Muscle weakness (generalized) (M62.81);Other symptoms and signs involving cognitive function   Activity Tolerance Patient tolerated treatment well   Patient Left in bed;with call bell/phone within reach;Other (comment) (sitting EOB)   Nurse Communication Mobility status;Other (comment) (RN observed end of session)        Time: [phone] OT Time Calculation (min): 23 min  Charges: OT General Charges $OT Visit: 1 Visit OT Treatments $Therapeutic Activity: 23-37 mins  Ronal Mallie POUR., COTA/L Acute Rehabilitation Services [phone]   Ronal Mallie Needy 05/19/2024, 3:14 PM

## 2024-05-19 NOTE — Progress Notes (Signed)
 Physical Therapy Treatment Patient Details Name: Michele Tyler MRN: [id number] DOB: Nov 30, 1945 Today's Date: 05/19/2024   History of Present Illness 78 y.o. female presents to [facility] 05/13/24 with 5 days of dizziness/unsteadiness. 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 PMHx: hypertension, hyperlipidemia   PT Comments  Pt received in supine and agreeable to PT session. Focused session on addressing balance and cognition deficits. When ambulating, pt tends to veer right/left with slight unsteadiness with turning head or when dual tasking. MinA to CGA needed to correct small losses of balance with pt attempting to recover with lateral side-steps. Pt had difficulty remembering commands and would need frequent reminders of what the task was. Recommending <3hrs post acute rehab as pt does not have 24/7 support at home to assist with medication management or taking care of six dogs at home. Acute PT to follow.    If plan is discharge home, recommend the following: A little help with walking and/or transfers;A little help with bathing/dressing/bathroom;Assist for transportation;Help with stairs or ramp for entrance   Can travel by private vehicle     Yes  Equipment Recommendations  Rolling walker (2 wheels)       Precautions / Restrictions Precautions Precautions: Fall Recall of Precautions/Restrictions: Impaired Restrictions Weight Bearing Restrictions Per Provider Order: No     Mobility  Bed Mobility Overal bed mobility: Needs Assistance Bed Mobility: Supine to Sit    Supine to sit: Modified independent (Device/Increase time)     Transfers Overall transfer level: Needs assistance Equipment used: None, Rolling walker (2 wheels) Transfers: Sit to/from Stand Sit to Stand: Contact guard assist, Supervision    General transfer comment: CGA- supervision for safety with and without RW     Ambulation/Gait Ambulation/Gait assistance: Contact guard assist, Min assist Gait Distance (Feet): 100 Feet (x100 w/ RW, x400 with no AD) Assistive device: Rolling walker (2 wheels), None Gait Pattern/deviations: Step-through pattern, Decreased stride length, Drifts right/left    General Gait Details: Intermittent MinA when dual tasking and when performing head turns with no AD   Stairs Stairs: Yes Stairs assistance: Contact guard assist, Min assist Stair Management: No rails, Forwards, Step to pattern Number of Stairs: 10 General stair comments: occasionally MinA for unsteadiness and small losses of balance with no UE support     Balance Overall balance assessment: Needs assistance Sitting-balance support: No upper extremity supported, Feet supported Sitting balance-Leahy Scale: Good     Standing balance support: During functional activity, No upper extremity supported Standing balance-Leahy Scale: Fair    High level balance activites: Sudden stops, Head turns, Direction changes High Level Balance Comments: dual task with counting backwards by 3       Communication Communication Communication: No apparent difficulties  Cognition Arousal: Alert Behavior During Therapy: WFL for tasks assessed/performed   PT - Cognitive impairments: Awareness, Safety/Judgement, Problem solving    PT - Cognition Comments: Increased difficulty with dual tasking. Intermittent following of single step commands with pt unable to remember prior instructions Following commands: Impaired Following commands impaired: Follows one step commands inconsistently    Cueing Cueing Techniques: Verbal cues, Tactile cues  Exercises Other Exercises Other Exercises: BLE step ups x10    General Comments General comments (skin integrity, edema, etc.): pts friend Michele Tyler present intermittently during session      Pertinent Vitals/Pain Pain Assessment Pain Assessment: No/denies pain     PT Goals  (current goals can now be found in  the care plan section) Acute Rehab PT Goals PT Goal Formulation: With patient Time For Goal Achievement: 05/28/24 Potential to Achieve Goals: Good Progress towards PT goals: Progressing toward goals    Frequency    Min 2X/week       AM-PAC PT 6 Clicks Mobility   Outcome Measure  Help needed turning from your back to your side while in a flat bed without using bedrails?: A Little Help needed moving from lying on your back to sitting on the side of a flat bed without using bedrails?: A Little Help needed moving to and from a bed to a chair (including a wheelchair)?: A Little Help needed standing up from a chair using your arms (e.g., wheelchair or bedside chair)?: A Little Help needed to walk in hospital room?: A Little Help needed climbing 3-5 steps with a railing? : A Little 6 Click Score: 18    End of Session   Activity Tolerance: Patient tolerated treatment well Patient left: in bed;with call bell/phone within reach;with family/visitor present Nurse Communication: Mobility status PT Visit Diagnosis: Unsteadiness on feet (R26.81);Other abnormalities of gait and mobility (R26.89);Muscle weakness (generalized) (M62.81)     Time: [id number] PT Time Calculation (min) (ACUTE ONLY): 23 min  Charges:    $Gait Training: 8-22 mins $Therapeutic Activity: 8-22 mins PT General Charges $$ ACUTE PT VISIT: 1 Visit                    Michele Tyler, PT, DPT Secure Chat Preferred  Rehab Office [phone]   Michele Tyler Michele Tyler 05/19/2024, 5:00 PM

## 2024-05-19 NOTE — Progress Notes (Signed)
 PROGRESS NOTE    Michele Tyler  FMW:[id number] DOB: [birth year] DOA: 05/12/2024 PCP: [name], MD   Brief Narrative:    Assessment and Plan:  Cerebellar stroke Patient presented with symptoms including dizziness and unsteadiness. Initial CT head (10/15) significant for possible subacute infarct in the superior right cerebellum. MRI brain (10/15) confirms an early subacute infarct of the superior right cerebellum in the right SCA territory with additional scattered early subacute infarcts involving the right greater then left cerebellar hemispheres. CTA head and neck significant for no large vessel occlusion. LDL of 47. Hemoglobin A1C of 5.5%. Transthoracic Echocardiogram significant for an LVEF of 55-60% with no atrial level shunt. Neurology recommendations for 3 weeks of Plavix and indefinite aspirin; Zio patch placed prior to discharge. PT/OT recommendations for SNF. Recommendation for Neurology follow-up in 4 weeks.  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 Noted that patient's PCP is following/managing as an outpatient. Sodium down to 128. -Check urine osmolality/sodium -Sodium tablet -Serum sodium in AM  Primary hypertension Hydrochlorothiazide held secondary to hyponatremia -Continue losartan   Iron deficiency anemia -Continue iron supplementation  Moderate pleural effusions Noted incidentally on CT imaging. Patient is asymptomatic. Unclear etiology.   DVT prophylaxis: Lovenox Code Status:   Code Status: Full Code Family Communication: None at bedside Disposition Plan: Discharge to SNF likely in 24 hours if sodium remains stable   Consultants:  Neurology  Procedures:  Transthoracic Echocardiogram  Antimicrobials: None    Subjective: Patient reports wanting to get up more to move around. No other concerns.  Objective: BP (!) 147/78 (BP Location: Right Arm)   Pulse 98   Temp 97.9 F (36.6 C) (Oral)    Resp 18   Ht 5' 4 (1.626 m)   Wt 50.8 kg   SpO2 98%   BMI 19.22 kg/m   Examination:  General exam: Appears calm and comfortable. Respiratory system: Clear to auscultation. Respiratory effort normal. Cardiovascular system: S1 & S2 heard, RRR. 2+ systolic murmur. Gastrointestinal system: Abdomen is nondistended, soft and nontender. Normal bowel sounds heard. Central nervous system: Alert and oriented. No focal neurological deficits. Musculoskeletal: No calf tenderness Skin: No cyanosis. No rashes Psychiatry: Judgement and insight appear normal. Mood & affect appropriate.    Data Reviewed: I have personally reviewed following labs and imaging studies  CBC Lab Results  Component Value Date   WBC 7.5 05/13/2024   RBC 4.13 05/13/2024   HGB 11.5 (L) 05/13/2024   HCT 35.2 (L) 05/13/2024   MCV 85.2 05/13/2024   MCH 27.8 05/13/2024   PLT 439 (H) 05/13/2024   MCHC 32.7 05/13/2024   RDW 12.9 05/13/2024   LYMPHSABS 0.5 (L) 05/12/2024   MONOABS 0.7 05/12/2024   EOSABS 0.0 05/12/2024   BASOSABS 0.0 05/12/2024     Last metabolic panel Lab Results  Component Value Date   NA 128 (L) 05/19/2024   K 4.3 05/16/2024   CL 94 (L) 05/16/2024   CO2 25 05/16/2024   BUN 12 05/16/2024   CREATININE 0.67 05/16/2024   GLUCOSE 98 05/16/2024   GFRNONAA >60 05/16/2024   GFRAA 106 07/05/2020   CALCIUM  8.4 (L) 05/16/2024   PROT 7.2 05/12/2024   ALBUMIN 4.5 05/12/2024   LABGLOB 2.2 05/10/2024   AGRATIO 2.1 01/03/2023   BILITOT 0.4 05/12/2024   ALKPHOS 94 05/12/2024   AST 44 (H) 05/12/2024   ALT 18 05/12/2024   ANIONGAP 10 05/16/2024    GFR: Estimated Creatinine Clearance: 48 mL/min (by C-G formula based  on SCr of 0.67 mg/dL).  No results found for this or any previous visit (from the past 240 hours).    Radiology Studies: No results found.    LOS: 7 days    Elgin Lam, MD [facility] 05/19/2024, 5:11 PM   If 7PM-7AM, please contact  night-coverage [email or web address]

## 2024-05-19 NOTE — TOC Progression Note (Addendum)
 Transition of Care [facility]) - Progression Note    Patient Details  Name: Michele Tyler MRN: [id number] Date of Birth: 07-01-1946  [facility] CM/SW Contact  Almarie CHRISTELLA Goodie, [place] Phone Number: 05/19/2024, 10:12 AM  Clinical Narrative:   CSW updated by CMA that insurance has offered peer to peer, due by noon. Information sent to MD and MD paged. CSW provided update to [facility] on peer to peer. CSW to follow.  UPDATE: CSW notified by MD that peer to peer was completed and patient was approved. [facility] does not have a bed available until tomorrow. CSW to follow.    Expected Discharge Plan: Skilled Nursing Facility Barriers to Discharge: Continued Medical Work up               Expected Discharge Plan and Services   Discharge Planning Services: CM Consult Post Acute Care Choice: Durable Medical Equipment Living arrangements for the past 2 months: Single Family Home Expected Discharge Date: 05/14/24               DME Arranged: Vannie rolling DME Agency: [employer] Date DME Agency Contacted: 05/14/24   Representative spoke with at DME Agency: [place]             Social Drivers of Health (SDOH) Interventions SDOH Screenings   Food Insecurity: No Food Insecurity (05/13/2024)  Housing: Low Risk  (05/13/2024)  Transportation Needs: No Transportation Needs (05/13/2024)  Utilities: Not At Risk (05/13/2024)  Alcohol Screen: Low Risk  (08/04/2023)  Depression (PHQ2-9): High Risk (04/08/2024)  Financial Resource Strain: Low Risk  (08/04/2023)  Physical Activity: Sufficiently Active (08/04/2023)  Social Connections: Moderately Isolated (05/13/2024)  Stress: Stress Concern Present (08/04/2023)  Tobacco Use: Medium Risk (05/12/2024)    Readmission Risk Interventions     No data to display

## 2024-05-20 DIAGNOSIS — I639 Cerebral infarction, unspecified: Secondary | ICD-10-CM | POA: Diagnosis not present

## 2024-05-20 LAB — SODIUM: Sodium: 131 mmol/L — ABNORMAL LOW (ref 135–145)

## 2024-05-20 MED ORDER — LOSARTAN POTASSIUM 100 MG PO TABS: 100.0000 mg | ORAL_TABLET | Freq: Every day | ORAL | Status: DC

## 2024-05-20 MED ORDER — ENSURE PLUS HIGH PROTEIN PO LIQD: 237.0000 mL | Freq: Two times a day (BID) | ORAL | Status: DC

## 2024-05-20 MED ORDER — CLOPIDOGREL BISULFATE 75 MG PO TABS: 75.0000 mg | ORAL_TABLET | Freq: Every day | ORAL | Status: AC

## 2024-05-20 MED ORDER — ASPIRIN 81 MG PO TBEC: 81.0000 mg | DELAYED_RELEASE_TABLET | Freq: Every day | ORAL | Status: DC

## 2024-05-20 NOTE — Progress Notes (Signed)
 Discharged to SNF.  Transported by [facility].

## 2024-05-20 NOTE — Discharge Instructions (Signed)
 Michele Tyler,  You were in the hospital with a stroke. You have been started on new medications, including Aspirin and Plavix (you will only take plavix for a limited time). Please follow-up with your PCP and the neurologist.

## 2024-05-20 NOTE — TOC Transition Note (Signed)
 Transition of Care [facility]) - Discharge Note   Patient Details  Name: Michele Tyler MRN: [id number] Date of Birth: [birth year]  Transition of Care [facility]) CM/SW Contact:  Almarie CHRISTELLA Goodie, LCSW Phone Number: 05/20/2024, 11:31 AM   Clinical Narrative:   CSW sent discharge information to [facility], confirmed receipt and bed available. CSW met with patient to discuss discharge, she will need transport arranged. Transport arranged with [facility] for 1:00, as patient just ordered lunch.   Nurse to call report to [phone] Room 501.    Final next level of care: Skilled Nursing Facility Barriers to Discharge: Barriers Resolved   Patient Goals and CMS Choice   CMS Medicare.gov Compare Post Acute Care list provided to:: Patient Choice offered to / list presented to : Patient      Discharge Placement              Patient chooses bed at: [facility] Patient to be transferred to facility by: [facility] Name of family member notified: Self Patient and family notified of of transfer: 05/20/24  Discharge Plan and Services Additional resources added to the After Visit Summary for     Discharge Planning Services: CM Consult Post Acute Care Choice: Durable Medical Equipment          DME Arranged: Vannie rolling DME Agency: [employer] Date DME Agency Contacted: 05/14/24   Representative spoke with at DME Agency: [place]            Social Drivers of Health (SDOH) Interventions SDOH Screenings   Food Insecurity: No Food Insecurity (05/13/2024)  Housing: Low Risk  (05/13/2024)  Transportation Needs: No Transportation Needs (05/13/2024)  Utilities: Not At Risk (05/13/2024)  Alcohol Screen: Low Risk  (08/04/2023)  Depression (PHQ2-9): High Risk (04/08/2024)  Financial Resource Strain: Low Risk  (08/04/2023)  Physical Activity: Sufficiently Active (08/04/2023)  Social Connections: Moderately Isolated (05/13/2024)  Stress: Stress Concern Present (08/04/2023)  Tobacco Use: Medium  Risk (05/12/2024)     Readmission Risk Interventions     No data to display

## 2024-05-20 NOTE — Discharge Summary (Signed)
 Physician Discharge Summary   Patient: Michele Tyler MRN: [id number] DOB: 05-Oct-1945  Admit date:     05/12/2024  Discharge date: 05/20/24  Discharge Physician: Elgin Lam, MD   PCP: [name], MD   Recommendations at discharge:  PCP visit for hospital follow-up Repeat BMP in 2-3 days to reassess hyponatremia Neurology follow-up  Discharge Diagnoses: Principal Problem:   Cerebellar stroke [facility]) Active Problems:   Hyperlipidemia   Iron deficiency  Resolved Problems:   * No resolved hospital problems. *  Hospital Course: Michele Tyler is a 78 y.o. female with a history of hypertension, hyperlipidemia.  Patient presented secondary to dizziness and unsteadiness with evidence of a subacute stroke. Neurology consulted. Patient started on Aspirin/Plavix combination with recommendations for SNF discharge. Hospitalization complicated by mild hyponatremia.  Assessment and Plan:  Cerebellar stroke Patient presented with symptoms including dizziness and unsteadiness. Initial CT head (10/15) significant for possible subacute infarct in the superior right cerebellum. MRI brain (10/15) confirms an early subacute infarct of the superior right cerebellum in the right SCA territory with additional scattered early subacute infarcts involving the right greater then left cerebellar hemispheres. CTA head and neck significant for no large vessel occlusion. LDL of 47. Hemoglobin A1C of 5.5%. Transthoracic Echocardiogram significant for an LVEF of 55-60% with no atrial level shunt. Neurology recommendations for 3 weeks of Plavix and indefinite aspirin; Zio patch placed prior to discharge. PT/OT recommendations for SNF. Recommendation for Neurology follow-up in 4 weeks.   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 Noted that patient's PCP is following/managing as an outpatient. Sodium down to 128 but improved to 131 after one 1 g sodium tablet.  Recommend to continue fluid restriction on discharge and to repeat a BMP in 2-3 days to ensure stable sodium. If worsening, may need to resume sodium tablets. Hydrochlorothiazide discontinued on discharge.   Primary hypertension Hydrochlorothiazide held secondary to hyponatremia. Continue losartan  on discharge.   Iron deficiency anemia Continue iron supplementation   Moderate pleural effusions Noted incidentally on CT imaging. Patient is asymptomatic. Unclear etiology.  Memory impairment Patient will need neurology follow-up. May need care personnel at home to help with ADLs depending on rehab.   Consultants:  Neurology   Procedures:  Transthoracic Echocardiogram  Disposition: Skilled nursing facility Diet recommendation: Regular diet, fluid restrict to 1200 mL daily   DISCHARGE MEDICATION: Allergies as of 05/20/2024       Reactions   Codeine Nausea Only        Medication List     STOP taking these medications    losartan -hydrochlorothiazide 100-25 MG tablet Commonly known as: HYZAAR   olmesartan  20 MG tablet Commonly known as: BENICAR    ondansetron  4 MG tablet Commonly known as: Zofran        TAKE these medications    Apple Cider Vinegar 600 MG Caps Take 600 mg by mouth daily with breakfast.   aspirin EC 81 MG tablet Take 1 tablet (81 mg total) by mouth daily. Swallow whole.   atorvastatin  10 MG tablet Commonly known as: LIPITOR TAKE 1 TABLET BY MOUTH AT  BEDTIME   B-12 1000 MCG Caps Take 1,000 mcg by mouth in the morning.   BALANCED B-150 COMPLEX TR PO Take 1 tablet by mouth See admin instructions. Take 1 tablet by mouth once a day (contains b-1 @ 150 mg, b-2 150 mg, b-6 150 mg, b-12 @ 150 mg, and niacin 150 mg)   BALANCED B-150 PO Take 1 capsule by mouth daily.  buPROPion  150 MG 24 hr tablet Commonly known as: WELLBUTRIN  XL TAKE 1 TABLET BY MOUTH DAILY   busPIRone  7.5 MG tablet Commonly known as: BUSPAR  TAKE 1 TABLET BY MOUTH 3 TIMES   DAILY What changed:  when to take this reasons to take this   CALCIUM  MAGNESIUM ZINC PO Take 1 tablet by mouth in the morning.   clopidogrel 75 MG tablet Commonly known as: PLAVIX Take 1 tablet (75 mg total) by mouth daily for 12 days. Start taking on: May 21, 2024   feeding supplement Liqd Take 237 mLs by mouth 2 (two) times daily between meals.   folic acid  800 MCG tablet Commonly known as: FOLVITE  Take 800 mcg by mouth daily.   Iron 325 (65 Fe) MG Tabs Take 325 mg by mouth daily with breakfast.   losartan  100 MG tablet Commonly known as: COZAAR  Take 1 tablet (100 mg total) by mouth daily. Start taking on: May 21, 2024   OMEGA-3 FISH OIL PO Take 4,000 mg by mouth daily.   Soy Isoflavone 750 MG Caps Take 750 mg by mouth daily.   TURMERIC PO Take 800 mg by mouth daily.   vitamin A 8000 UNIT capsule Take 8,000 Units by mouth daily.   VITAMIN C CR 1500 MG Tbcr Take 1,500 mg by mouth daily.   Vitamin D3 125 MCG (5000 UT) Caps Take 5,000 Units by mouth daily with breakfast.   VITAMIN E-1000 PO Take 1,000 Units by mouth daily.               Durable Medical Equipment  (From admission, onward)           Start     Ordered   05/14/24 1528  For home use only DME Walker rolling  Once       Question Answer Comment  Walker: With 5 Inch Wheels   Patient needs a walker to treat with the following condition Weakness      05/14/24 1527            Contact information for follow-up providers     [facility]. Schedule an appointment as soon as possible for a visit.   Specialty: Rehabilitation Contact information: [address] [place]  [address] [phone]        [facility]. Schedule an appointment as soon as possible for a visit in 1 month(s).   Specialty: Neurology Why: stroke clinic Contact information: [address] [place]  [address] [phone]        [name], MD. Schedule an appointment as soon as possible for a visit.   Specialty: Family Medicine Why: For hospital follow-up Contact information: [address] [place] [address] [phone]              Contact information for after-discharge care     Destination     [facility] .   Service: Skilled Nursing Contact information: [address] [place]  [address] [phone]                    Discharge Exam: BP 136/74 (BP Location: Left Arm)   Pulse 88   Temp 98.1 F (36.7 C) (Oral)   Resp 16   Ht 5' 4 (1.626 m)   Wt 50.8 kg   SpO2 96%   BMI 19.22 kg/m   General exam: Appears calm and comfortable. Respiratory system: Clear to auscultation.  Respiratory effort normal. Cardiovascular system: S1 & S2 heard, RRR. No murmur. Gastrointestinal system: Abdomen is nondistended, soft and nontender. Normal bowel sounds heard. Central nervous system: Alert and oriented. Musculoskeletal: No edema. No calf tenderness Psychiatry: Judgement and insight appear normal.  Memory is impaired. Mood & affect appropriate.   Condition at discharge: stable  The results of significant diagnostics from this hospitalization (including imaging, microbiology, ancillary and laboratory) are listed below for reference.   Imaging Studies: ECHOCARDIOGRAM COMPLETE Result Date: 05/14/2024    ECHOCARDIOGRAM REPORT   Patient Name:   AMAKA GLUTH Date of Exam: 05/13/2024 Medical Rec #:  [id number]       Height:       64.0 in Accession #:    [id number]      Weight:       112.0 lb Date of Birth:  06-Jul-1946      BSA:          1.529 m Patient Age:    77 years        BP:           152/89 mmHg Patient Gender: F               HR:           50 bpm. Exam Location:  Inpatient Procedure: 2D Echo, Cardiac Doppler and Color Doppler (Both Spectral and Color            Flow Doppler were utilized during  procedure). Indications:    Stroke  History:        Patient has no prior history of Echocardiogram examinations.                 Stroke; Risk Factors:Dyslipidemia, Hypertension and Former                 Smoker.  Sonographer:    Juliene Rucks Referring Phys: [id number] ASHISH ARORA IMPRESSIONS  1. Left ventricular ejection fraction, by estimation, is 55 to 60%. The left ventricle has normal function. The left ventricle has no regional wall motion abnormalities. Left ventricular diastolic parameters were normal.  2. Right ventricular systolic function is normal. The right ventricular size is normal.  3. The mitral valve is normal in structure. Trivial mitral valve regurgitation. No evidence of mitral stenosis.  4. There appears to be a mobile echolucent mass on the right coronary cusp which is likely calcification however in the setting of a stroke TEE will be beneficial for clarity. The aortic valve is normal in structure. Aortic valve regurgitation is not visualized. No aortic stenosis is present.  5. The inferior vena cava is normal in size with greater than 50% respiratory variability, suggesting right atrial pressure of 3 mmHg. FINDINGS  Left Ventricle: Left ventricular ejection fraction, by estimation, is 55 to 60%. The left ventricle has normal function. The left ventricle has no regional wall motion abnormalities. The left ventricular internal cavity size was normal in size. There is  no left ventricular hypertrophy. Left ventricular diastolic parameters were normal. Right Ventricle: The right ventricular size is normal. No increase in right ventricular wall thickness. Right ventricular systolic function is normal. Left Atrium: Left atrial size was normal in size. Right Atrium: Right atrial size was normal in size. Prominent Eustachian valve. Pericardium: There is no evidence of pericardial effusion. Mitral Valve: The mitral valve is normal in structure. Trivial mitral valve regurgitation. No evidence of mitral  valve stenosis. Tricuspid Valve: The tricuspid valve is normal in structure. Tricuspid valve  regurgitation is trivial. No evidence of tricuspid stenosis. 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 The aortic valve is normal in structure. Aortic valve regurgitation  is not visualized. No aortic stenosis is present. Pulmonic Valve: The pulmonic valve was normal in structure. Pulmonic valve regurgitation is not visualized. No evidence of pulmonic stenosis. Aorta: The aortic root is normal in size and structure. Venous: The inferior vena cava is normal in size with greater than 50% respiratory variability, suggesting right atrial pressure of 3 mmHg. IAS/Shunts: No atrial level shunt detected by color flow Doppler.   LV Volumes (MOD) LV vol d, MOD A2C: 93.0 ml LV vol d, MOD A4C: 75.0 ml LV vol s, MOD A2C: 38.2 ml LV vol s, MOD A4C: 31.1 ml LV SV MOD A2C:     54.8 ml LV SV MOD A4C:     75.0 ml LV SV MOD BP:      49.1 ml RIGHT VENTRICLE          IVC RV Basal diam:  2.80 cm  IVC diam: 1.00 cm RV Mid diam:    2.20 cm LEFT ATRIUM             Index        RIGHT ATRIUM          Index LA Vol (A2C):   22.5 ml 14.71 ml/m  RA Area:     8.98 cm LA Vol (A4C):   33.0 ml 21.58 ml/m  RA Volume:   18.50 ml 12.10 ml/m LA Biplane Vol: 30.2 ml 19.75 ml/m Kardie Tobb DO Electronically signed by Dub Huntsman DO Signature Date/Time: 05/14/2024/8:00:12 AM    Final    VAS US  LOWER EXTREMITY VENOUS (DVT) Result Date: 05/13/2024  Lower Venous DVT Study Patient Name:  ARTHEA NOBEL  Date of Exam:   05/13/2024 Medical Rec #: [id number]        Accession #:    [id number] Date of Birth: Feb 26, 1946       Patient Gender: F Patient Age:   36 years Exam Location:  [facility] Procedure:      VAS US  LOWER EXTREMITY VENOUS (DVT) Referring Phys: ARY XU  --------------------------------------------------------------------------------  Indications: Stroke.  Comparison Study: No prior study on file Performing Technologist: Alberta Lis RVS  Examination Guidelines: A complete evaluation includes B-mode imaging, spectral Doppler, color Doppler, and power Doppler as needed of all accessible portions of each vessel. Bilateral testing is considered an integral part of a complete examination. Limited examinations for reoccurring indications may be performed as noted. The reflux portion of the exam is performed with the patient in reverse Trendelenburg.  +---------+---------------+---------+-----------+----------+--------------+ RIGHT    CompressibilityPhasicitySpontaneityPropertiesThrombus Aging +---------+---------------+---------+-----------+----------+--------------+ CFV      Full           Yes      Yes                                 +---------+---------------+---------+-----------+----------+--------------+ SFJ      Full                                                        +---------+---------------+---------+-----------+----------+--------------+  FV Prox  Full           Yes      Yes                                 +---------+---------------+---------+-----------+----------+--------------+ FV Mid   Full                                                        +---------+---------------+---------+-----------+----------+--------------+ FV DistalFull                                                        +---------+---------------+---------+-----------+----------+--------------+ PFV      Full           Yes      No                                  +---------+---------------+---------+-----------+----------+--------------+ POP      Full           Yes      Yes                                 +---------+---------------+---------+-----------+----------+--------------+ PTV      Full                                                         +---------+---------------+---------+-----------+----------+--------------+ PERO     Full                                                        +---------+---------------+---------+-----------+----------+--------------+   +---------+---------------+---------+-----------+----------+-------------------+ LEFT     CompressibilityPhasicitySpontaneityPropertiesThrombus Aging      +---------+---------------+---------+-----------+----------+-------------------+ CFV      Full           Yes      Yes                                      +---------+---------------+---------+-----------+----------+-------------------+ SFJ      Full                                                             +---------+---------------+---------+-----------+----------+-------------------+ FV Prox  Full                                                             +---------+---------------+---------+-----------+----------+-------------------+  FV Mid   Full                                                             +---------+---------------+---------+-----------+----------+-------------------+ FV DistalFull                                                             +---------+---------------+---------+-----------+----------+-------------------+ PFV      Full                                                             +---------+---------------+---------+-----------+----------+-------------------+ POP                     Yes      Yes                  patent by color and                                                       Doppler             +---------+---------------+---------+-----------+----------+-------------------+ PTV      Full                                                             +---------+---------------+---------+-----------+----------+-------------------+ PERO     Full                                                              +---------+---------------+---------+-----------+----------+-------------------+     Summary: BILATERAL: - No evidence of deep vein thrombosis seen in the lower extremities, bilaterally. -No evidence of popliteal cyst, bilaterally.   *See table(s) above for measurements and observations. Electronically signed by Debby Robertson on 05/13/2024 at 5:14:37 PM.    Final    CT ANGIO HEAD NECK W WO CM Result Date: 05/12/2024 EXAM: CTA HEAD AND NECK WITHOUT AND WITH 05/12/2024 07:25:25 PM TECHNIQUE: CTA of the head and neck was performed without and with the administration of 75 mL of iohexol (OMNIPAQUE) 350 MG/ML injection. Multiplanar 2D and/or 3D reformatted images are provided for review. Automated exposure control, iterative reconstruction, and/or weight based adjustment of the mA/kV was utilized to reduce the radiation dose to as low as reasonably achievable. Stenosis of the internal carotid arteries measured using NASCET criteria. COMPARISON: Comparison with prior CT and MRI from earlier the  same day. CLINICAL HISTORY: Stroke/TIA, determine embolic source. Pt presents today due to dizziness for the past 6 days. Pt was seen by her PCP 2 days ago and they performed blood work that was found to be unremarkable. Pt states that she is still experiencing dizziness, states that she feels when she stands from a sitting position it takes her a minute to catch her balance, pt denies room spinning. FINDINGS: LIMITATIONS: Examination degraded by motion artifact. CTA NECK: AORTIC ARCH AND ARCH VESSELS: Mild aortic atherosclerosis. No dissection or arterial injury. No significant stenosis of the brachiocephalic or subclavian arteries. CERVICAL CAROTID ARTERIES: No dissection, arterial injury, or hemodynamically significant stenosis by NASCET criteria. CERVICAL VERTEBRAL ARTERIES: Right vertebral artery dominant. No dissection, arterial injury, or significant stenosis. LUNGS AND MEDIASTINUM: Moderate layering bilateral  pleural effusions, left larger than right. Mild pulmonary interstitial congestion within the visualized aerated portions of the lungs. SOFT TISSUES: No acute abnormality. BONES: Grade 1 degenerative anterolisthesis of C4 on C5. Moderate cervical spondylosis at C5-C6 and C6-C7. CTA HEAD: ANTERIOR CIRCULATION: No significant stenosis of the internal carotid arteries. No significant stenosis of the anterior cerebral arteries. No significant stenosis of the middle cerebral arteries. No aneurysm. POSTERIOR CIRCULATION: Right PCA supplied via the basilar as well as a prominent right posterior carotid artery. Fetal type origin of the left PCA. No significant stenosis of the posterior cerebral arteries. No significant stenosis of the basilar artery. No significant stenosis of the vertebral arteries. No aneurysm. OTHER: No dural venous sinus thrombosis on this non-dedicated study. IMPRESSION: 1. Negative CTA with no large vessel occlusion or other emergent findings. Mild premature atheromatous disease without hemodynamically significant or correctable stenosis. 2. Moderate layering pleural effusions, left greater than right, with mild pulmonary interstitial congestion within the visualized lungs. Electronically signed by: Morene Hoard MD 05/12/2024 07:46 PM EDT RP Workstation: [id number]   MR BRAIN WO CONTRAST Result Date: 05/12/2024 EXAM: MRI BRAIN WITHOUT CONTRAST 05/12/2024 05:53:55 PM TECHNIQUE: Multiplanar multisequence MRI of the head/brain was performed without the administration of intravenous contrast. COMPARISON: Comparison with prior head CT from earlier the same day. CLINICAL HISTORY: FINDINGS: BRAIN AND VENTRICLES: No midline shift. No hydrocephalus. Patchy and confluent T2 FLAIR signal abnormality involving the periventricular and deep white matter of both cerebral hemispheres as well as the pons, consistent with chronic subvascular ischemic disease, advanced in nature. Few scattered remote lacunar  infarcts about the hemispheric cerebral white matter. Few scattered remote cerebellar infarcts noted. Early subacute ischemic infarct involving the superior right cerebellum, right SCA distribution (series 5, image 13). Area of infarction measures up to 4.2 cm. Multiple additional scattered early subacute infarcts seen involving the right greater than left cortical to subcortical cerebral hemispheres. Few scattered foci of associated petechial blood products noted. No significant regional mass effect. Few scattered chronic microhemorrhages noted, likely small vessel hypertensive in nature. The sella is unremarkable. Normal flow voids. ORBITS: No acute abnormality. SINUSES AND MASTOIDS: No acute abnormality. BONES AND SOFT TISSUES: Normal marrow signal. 1.9 cm nodule lesion present at the left parietal scalp (series 8, image 25), indeterminate. IMPRESSION: 1. Early subacute infarct of the superior right cerebellum in the right SCA territory, with additional scattered early subacute infarcts involving the right greater than left cerebral hemispheres. Mild associated petechial blood products without hemorrhagic transformation or significant regional mass effect. 2. Underlying advanced chronic small vessel ischemic disease with scattered remote lacunar and cerebellar infarcts. Electronically signed by: Morene Hoard MD 05/12/2024 06:09 PM EDT RP Workstation: [id number]  CT Head Wo Contrast Result Date: 05/12/2024 EXAM: CT HEAD WITHOUT CONTRAST 05/12/2024 01:56:16 PM TECHNIQUE: CT of the head was performed without the administration of intravenous contrast. Automated exposure control, iterative reconstruction, and/or weight based adjustment of the mA/kV was utilized to reduce the radiation dose to as low as reasonably achievable. COMPARISON: None available. CLINICAL HISTORY: Vertigo, peripheral. Pt BIB EMS from urgent care due to Hyponatremia. Pt c/o dizziness and weakness since Friday. FINDINGS: BRAIN AND  VENTRICLES: No acute hemorrhage. No evidence of acute infarct. Old right cerebellar infarct. Subacute to chronic right superior cerebellar infarct. Remote infarct in the right occipital lobe and additional remote infarcts in the posterior lateral right temporal lobe. Small remote infarcts in the left cerebellum. There is likely an additional remote infarct in the left thalamus. Atherosclerosis of the carotid siphons. Moderate chronic microvascular ischemic change. No hydrocephalus. No extra-axial collection. No mass effect or midline shift. ORBITS: No acute abnormality. SINUSES: No acute abnormality. SOFT TISSUES AND SKULL: No acute soft tissue abnormality. No skull fracture. IMPRESSION: 1. Possible subacute infarct in the superior right cerebellum. Recommend MRI for further evaluation. 2. Additional remote infarcts in the right cerebellum, right occipital lobe, and posterolateral right temporal lobe. Small remote infarcts in the left cerebellum. Likely additional remote infarct in the left thalamus. 3. Moderate chronic microvascular ischemic change. Electronically signed by: Donnice Mania MD 05/12/2024 02:16 PM EDT RP Workstation: [id number]   DG Chest Port 1 View Result Date: 05/12/2024 CLINICAL DATA:  Dizziness and weakness. EXAM: PORTABLE CHEST 1 VIEW COMPARISON:  May 12, 2024 FINDINGS: The heart size and mediastinal contours are within normal limits. Mild, stable atelectasis and/or infiltrate is seen within the left lung base. Very mild atelectatic changes are also seen within the lateral aspect of the right lung base. There is a small, stable left pleural effusion. No pneumothorax is identified. The visualized skeletal structures are unremarkable. IMPRESSION: 1. Mild, stable left basilar atelectasis and/or infiltrate. 2. Very mild right basilar atelectasis. 3. Small, stable left pleural effusion. Electronically Signed   By: Suzen Dials M.D.   On: 05/12/2024 14:06   DG Chest 2 View Result Date:  05/12/2024 EXAM: 2 VIEW(S) XRAY OF THE CHEST 05/12/2024 11:07:25 AM COMPARISON: None available. CLINICAL HISTORY: dizziness. Pt c/o dizziness and weakness Onset 6 days ago Also st's her blood glucose is low FINDINGS: LUNGS AND PLEURA: Small left pleural effusion. Left basilar atelectasis. No focal pulmonary opacity. No pulmonary edema. No pneumothorax. HEART AND MEDIASTINUM: Calcified aorta. No acute abnormality of the cardiac and mediastinal silhouettes. BONES AND SOFT TISSUES: No acute osseous abnormality. IMPRESSION: 1. Small left pleural effusion with left basilar atelectasis. Electronically signed by: Oneil Devonshire MD 05/12/2024 11:29 AM EDT RP Workstation: [id number]    Microbiology: No results found for this or any previous visit.  Labs: CBC: No results for input(s): WBC, NEUTROABS, HGB, HCT, MCV, PLT in the last 168 hours. Basic Metabolic Panel: Recent Labs  Lab 05/14/24 0514 05/16/24 0700 05/17/24 0546 05/19/24 1419 05/20/24 0127  NA 131* 129* 129* 128* 131*  K 4.0 4.3  --   --   --   CL 96* 94*  --   --   --   CO2 26 25  --   --   --   GLUCOSE 92 98  --   --   --   BUN 10 12  --   --   --   CREATININE 0.67 0.67  --   --   --  CALCIUM  8.4* 8.4*  --   --   --     Discharge time spent: 35 minutes.  Signed: Elgin Lam, MD [facility] 05/20/2024

## 2024-05-20 NOTE — Plan of Care (Signed)
  Problem: Health Behavior/Discharge Planning: Goal: Ability to manage health-related needs will improve Outcome: Progressing Goal: Goals will be collaboratively established with patient/family Outcome: Progressing   Problem: Self-Care: Goal: Ability to participate in self-care as condition permits will improve Outcome: Progressing Goal: Verbalization of feelings and concerns over difficulty with self-care will improve Outcome: Progressing Goal: Ability to communicate needs accurately will improve Outcome: Progressing   Problem: Nutrition: Goal: Risk of aspiration will decrease Outcome: Progressing Goal: Dietary intake will improve Outcome: Progressing   Problem: Clinical Measurements: Goal: Ability to maintain clinical measurements within normal limits will improve Outcome: Progressing Goal: Will remain free from infection Outcome: Progressing Goal: Diagnostic test results will improve Outcome: Progressing Goal: Respiratory complications will improve Outcome: Progressing Goal: Cardiovascular complication will be avoided Outcome: Progressing   Problem: Activity: Goal: Risk for activity intolerance will decrease Outcome: Progressing   Problem: Elimination: Goal: Will not experience complications related to bowel motility Outcome: Progressing Goal: Will not experience complications related to urinary retention Outcome: Progressing   Problem: Pain Managment: Goal: General experience of comfort will improve and/or be controlled Outcome: Progressing   Problem: Safety: Goal: Ability to remain free from injury will improve Outcome: Progressing   Problem: Skin Integrity: Goal: Risk for impaired skin integrity will decrease Outcome: Progressing

## 2024-05-21 ENCOUNTER — Ambulatory Visit

## 2024-05-26 ENCOUNTER — Telehealth: Payer: Self-pay | Admitting: *Deleted

## 2024-05-26 DIAGNOSIS — E785 Hyperlipidemia, unspecified: Secondary | ICD-10-CM | POA: Diagnosis not present

## 2024-05-26 DIAGNOSIS — I10 Essential (primary) hypertension: Secondary | ICD-10-CM | POA: Diagnosis not present

## 2024-05-26 DIAGNOSIS — I63541 Cerebral infarction due to unspecified occlusion or stenosis of right cerebellar artery: Secondary | ICD-10-CM | POA: Diagnosis not present

## 2024-05-26 DIAGNOSIS — R2681 Unsteadiness on feet: Secondary | ICD-10-CM | POA: Diagnosis not present

## 2024-05-26 NOTE — Telephone Encounter (Signed)
 Copied from CRM [id number]. Topic: General - Other >> May 26, 2024  2:58 PM Rosaria BRAVO wrote: Reason for CRM: Possible discharge from rehab on Friday. [facility] will notify PCP if so. [name] called to share this   Best contact: [phone]

## 2024-05-27 ENCOUNTER — Telehealth: Payer: Self-pay

## 2024-05-27 DIAGNOSIS — M6281 Muscle weakness (generalized): Secondary | ICD-10-CM | POA: Diagnosis not present

## 2024-05-27 DIAGNOSIS — I63541 Cerebral infarction due to unspecified occlusion or stenosis of right cerebellar artery: Secondary | ICD-10-CM | POA: Diagnosis not present

## 2024-05-27 DIAGNOSIS — E871 Hypo-osmolality and hyponatremia: Secondary | ICD-10-CM | POA: Diagnosis not present

## 2024-05-27 DIAGNOSIS — E785 Hyperlipidemia, unspecified: Secondary | ICD-10-CM | POA: Diagnosis not present

## 2024-05-27 DIAGNOSIS — I10 Essential (primary) hypertension: Secondary | ICD-10-CM | POA: Diagnosis not present

## 2024-05-27 DIAGNOSIS — R2681 Unsteadiness on feet: Secondary | ICD-10-CM | POA: Diagnosis not present

## 2024-05-27 NOTE — Telephone Encounter (Signed)
 Copied from CRM #[id number]. Topic: General - Other >> May 27, 2024 10:23 AM Hadassah PARAS wrote: Reason for CRM: Pt is being discharged Friday and does not know what medications she will be sent with or having to pick up medication. Please #[phone]

## 2024-05-28 NOTE — Telephone Encounter (Signed)
 I do not have access to what she is taking at the SNF.  I have the records from the hospital discharge and it should not be significantly different from that.  The SNF should provider her with a summary of her updated medication list when she leaves and we can go over any changes that were made at her appointment.

## 2024-05-31 ENCOUNTER — Telehealth: Payer: Self-pay

## 2024-05-31 ENCOUNTER — Other Ambulatory Visit: Payer: Self-pay

## 2024-05-31 MED ORDER — ONDANSETRON HCL 4 MG PO TABS: 4.0000 mg | ORAL_TABLET | Freq: Three times a day (TID) | ORAL | 0 refills | Status: DC | PRN

## 2024-05-31 NOTE — Progress Notes (Signed)
 Cardiology Office Note   Date:  06/07/2024  ID:  Keita Demarco, DOB 04-30-1946, MRN [id number] PCP: [name], MD  [facility] Providers Cardiologist:  [name], MD   History of Present Illness Michele Tyler is a 78 y.o. female with a past medical history of hypertension, hyperlipidemia, recent stroke.  Presents today to establish care and to arrange TEE.  Patient recently admitted from 10/15- 05/20/24.  Hide presented with dizziness and unsteadiness.  Initial CT head was significant for possible subacute infarct in the superior right cerebellum.  MRI brain confirmed an early subacute infarct of the superior right cerebellum on the right SCA territory.  CTA head and neck showed no large vessel occlusion.  Echocardiogram showed EF 55-60%, no regional wall motion abnormalities, normal LV diastolic parameters, normal RV systolic function and size.  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  However in the setting of stroke recommended TEE.  Patient was seen by neurology.  Started on 3 weeks of Plavix, indefinite aspirin.  A ZIO monitor placed prior to discharge.  Today, patient reports that she has overall been doing well since her admission. She was discharged to a SNF/rehab center. Has since been able to return home. She continues to work with PT and tolerates it well. Tells me that she has been able to walk up and down her driveway twice. No chest pain or DOE. No palpitations. No dizziness, syncope, near syncope. She is compliant with her aspirin and plavix. No bleeding or bruising on DAPT. She has an appointment with her PCP this week and neurology next week.      Studies Reviewed      Cardiac Studies & Procedures   ______________________________________________________________________________________________     ECHOCARDIOGRAM  ECHOCARDIOGRAM COMPLETE 05/13/2024  Narrative ECHOCARDIOGRAM  REPORT    [name] Date of Exam: 05/13/2024 Medical Rec #:  [id number]       Height:       64.0 in Accession #:    [id number]      Weight:       112.0 lb Date of Birth:  10-04-1945      BSA:          1.529 m Patient Age:    77 years        BP:           152/89 mmHg Patient Gender: F               HR:           50 bpm. Exam Location:  Inpatient  Procedure: 2D Echo, Cardiac Doppler and Color Doppler (Both Spectral and Color Flow Doppler were utilized during procedure).  Indications:    Stroke  History:        Patient has no prior history of Echocardiogram examinations. Stroke; Risk Factors:Dyslipidemia, Hypertension and Former Smoker.  Sonographer:    Juliene Rucks Referring Phys: [id number] ASHISH ARORA  IMPRESSIONS   1. Left ventricular ejection fraction, by estimation, is 55 to 60%. The left ventricle has normal function. The left ventricle has no regional wall motion abnormalities. Left ventricular diastolic parameters were normal. 2. Right ventricular systolic function is normal. The right ventricular size is normal. 3. The mitral valve is normal in structure. Trivial mitral valve regurgitation. No evidence of mitral stenosis. 4. 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 The aortic valve is normal in structure. Aortic valve regurgitation is not visualized. No aortic stenosis is present. 5. The inferior vena cava is normal in size with greater than 50% respiratory variability, suggesting right atrial pressure of 3 mmHg.  FINDINGS Left Ventricle: Left ventricular ejection fraction, by estimation, is 55 to 60%. The left ventricle has normal function. The left ventricle has no regional wall motion abnormalities. The left ventricular internal cavity size was normal in size. There is no left ventricular hypertrophy. Left ventricular  diastolic parameters were normal.  Right Ventricle: The right ventricular size is normal. No increase in right ventricular wall thickness. Right ventricular systolic function is normal.  Left Atrium: Left atrial size was normal in size.  Right Atrium: Right atrial size was normal in size. Prominent Eustachian valve.  Pericardium: There is no evidence of pericardial effusion.  Mitral Valve: The mitral valve is normal in structure. Trivial mitral valve regurgitation. No evidence of mitral valve stenosis.  Tricuspid Valve: The tricuspid valve is normal in structure. Tricuspid valve regurgitation is trivial. No evidence of tricuspid stenosis.  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 The aortic valve is normal in structure. Aortic valve regurgitation is not visualized. No aortic stenosis is present.  Pulmonic Valve: The pulmonic valve was normal in structure. Pulmonic valve regurgitation is not visualized. No evidence of pulmonic stenosis.  Aorta: The aortic root is normal in size and structure.  Venous: The inferior vena cava is normal in size with greater than 50% respiratory variability, suggesting right atrial pressure of 3 mmHg.  IAS/Shunts: No atrial level shunt detected by color flow Doppler.    LV Volumes (MOD) LV vol d, MOD A2C: 93.0 ml LV vol d, MOD A4C: 75.0 ml LV vol s, MOD A2C: 38.2 ml LV vol s, MOD A4C: 31.1 ml LV SV MOD A2C:     54.8 ml LV SV MOD A4C:     75.0 ml LV SV MOD BP:      49.1 ml  RIGHT VENTRICLE          IVC RV Basal diam:  2.80 cm  IVC diam: 1.00 cm RV Mid diam:    2.20 cm  LEFT ATRIUM             Index        RIGHT ATRIUM          Index LA Vol (A2C):   22.5 ml 14.71 ml/m  RA Area:     8.98 cm LA Vol (A4C):   33.0 ml 21.58 ml/m  RA Volume:   18.50 ml 12.10 ml/m LA Biplane Vol: 30.2 ml 19.75  ml/m  Kardie Tobb DO Electronically signed by Dub Huntsman DO Signature Date/Time: 05/14/2024/8:00:12 AM    Final          ______________________________________________________________________________________________      Risk Assessment/Calculations           Physical Exam VS:  BP 130/70   Pulse 82   Ht 5' 4 (1.626 m)   Wt 109 lb 6.4 oz (49.6 kg)   SpO2 97%   BMI 18.78 kg/m        Wt Readings from Last 3 Encounters:  06/07/24 109 lb 6.4 oz (49.6 kg)  05/13/24 111 lb 15.9 oz (50.8 kg)  05/12/24 112  lb (50.8 kg)    GEN: Well nourished, well developed in no acute distress. Sitting comfortably on the exam table  NECK: No JVD  CARDIAC:  RRR, no murmurs, rubs, gallops. Radial pulses 2+ bilaterally  RESPIRATORY:  Clear to auscultation without rales, wheezing or rhonchi. Normal WOB on room air   ABDOMEN: Soft, non-tender, non-distended EXTREMITIES:  No edema in BLE; No deformity   ASSESSMENT AND PLAN  Recent CVA Aortic Valve Mass  - Admitted from 10/15-10/23 with CVA. Echocardiogram that admission showed EF 55-60%, no regional wall motion abnormalities, normal LV diastolic parameters, normal RV systolic function and size.  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  However in the setting of stroke recommended TEE - Ordered TEE  - Cardiac monitor pending- patient wore the monitor at SNF after her discharge. While there, she completed the monitor and SNF staff was suppose to mail it back. Monitor has not yet been mailed back. We have contacted SNF to find monitor  - LDL 47 - continue lipitor 10 mg daily  - Continue ASA 81 mg daily. Continue plavix 75 mg daily for 2 more days (to complete 3 weeks of DAPT)  - Has follow up with neurology next week   HTN  - BP well controlled. 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      Dispo: Follow up pending results of TEE, cardiac monitor   Signed, Rollo FABIENE Louder, PA-C

## 2024-05-31 NOTE — H&P (View-Only) (Signed)
 Cardiology Office Note   Date:  06/07/2024  ID:  Keita Demarco, DOB 04-30-1946, MRN [id number] PCP: [name], MD  [facility] Providers Cardiologist:  [name], MD   History of Present Illness Michele Tyler is a 78 y.o. female with a past medical history of hypertension, hyperlipidemia, recent stroke.  Presents today to establish care and to arrange TEE.  Patient recently admitted from 10/15- 05/20/24.  Hide presented with dizziness and unsteadiness.  Initial CT head was significant for possible subacute infarct in the superior right cerebellum.  MRI brain confirmed an early subacute infarct of the superior right cerebellum on the right SCA territory.  CTA head and neck showed no large vessel occlusion.  Echocardiogram showed EF 55-60%, no regional wall motion abnormalities, normal LV diastolic parameters, normal RV systolic function and size.  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  However in the setting of stroke recommended TEE.  Patient was seen by neurology.  Started on 3 weeks of Plavix, indefinite aspirin.  A ZIO monitor placed prior to discharge.  Today, patient reports that she has overall been doing well since her admission. She was discharged to a SNF/rehab center. Has since been able to return home. She continues to work with PT and tolerates it well. Tells me that she has been able to walk up and down her driveway twice. No chest pain or DOE. No palpitations. No dizziness, syncope, near syncope. She is compliant with her aspirin and plavix. No bleeding or bruising on DAPT. She has an appointment with her PCP this week and neurology next week.      Studies Reviewed      Cardiac Studies & Procedures   ______________________________________________________________________________________________     ECHOCARDIOGRAM  ECHOCARDIOGRAM COMPLETE 05/13/2024  Narrative ECHOCARDIOGRAM  REPORT    [name] Date of Exam: 05/13/2024 Medical Rec #:  [id number]       Height:       64.0 in Accession #:    [id number]      Weight:       112.0 lb Date of Birth:  10-04-1945      BSA:          1.529 m Patient Age:    77 years        BP:           152/89 mmHg Patient Gender: F               HR:           50 bpm. Exam Location:  Inpatient  Procedure: 2D Echo, Cardiac Doppler and Color Doppler (Both Spectral and Color Flow Doppler were utilized during procedure).  Indications:    Stroke  History:        Patient has no prior history of Echocardiogram examinations. Stroke; Risk Factors:Dyslipidemia, Hypertension and Former Smoker.  Sonographer:    Juliene Rucks Referring Phys: [id number] ASHISH ARORA  IMPRESSIONS   1. Left ventricular ejection fraction, by estimation, is 55 to 60%. The left ventricle has normal function. The left ventricle has no regional wall motion abnormalities. Left ventricular diastolic parameters were normal. 2. Right ventricular systolic function is normal. The right ventricular size is normal. 3. The mitral valve is normal in structure. Trivial mitral valve regurgitation. No evidence of mitral stenosis. 4. 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 The aortic valve is normal in structure. Aortic valve regurgitation is not visualized. No aortic stenosis is present. 5. The inferior vena cava is normal in size with greater than 50% respiratory variability, suggesting right atrial pressure of 3 mmHg.  FINDINGS Left Ventricle: Left ventricular ejection fraction, by estimation, is 55 to 60%. The left ventricle has normal function. The left ventricle has no regional wall motion abnormalities. The left ventricular internal cavity size was normal in size. There is no left ventricular hypertrophy. Left ventricular  diastolic parameters were normal.  Right Ventricle: The right ventricular size is normal. No increase in right ventricular wall thickness. Right ventricular systolic function is normal.  Left Atrium: Left atrial size was normal in size.  Right Atrium: Right atrial size was normal in size. Prominent Eustachian valve.  Pericardium: There is no evidence of pericardial effusion.  Mitral Valve: The mitral valve is normal in structure. Trivial mitral valve regurgitation. No evidence of mitral valve stenosis.  Tricuspid Valve: The tricuspid valve is normal in structure. Tricuspid valve regurgitation is trivial. No evidence of tricuspid stenosis.  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 The aortic valve is normal in structure. Aortic valve regurgitation is not visualized. No aortic stenosis is present.  Pulmonic Valve: The pulmonic valve was normal in structure. Pulmonic valve regurgitation is not visualized. No evidence of pulmonic stenosis.  Aorta: The aortic root is normal in size and structure.  Venous: The inferior vena cava is normal in size with greater than 50% respiratory variability, suggesting right atrial pressure of 3 mmHg.  IAS/Shunts: No atrial level shunt detected by color flow Doppler.    LV Volumes (MOD) LV vol d, MOD A2C: 93.0 ml LV vol d, MOD A4C: 75.0 ml LV vol s, MOD A2C: 38.2 ml LV vol s, MOD A4C: 31.1 ml LV SV MOD A2C:     54.8 ml LV SV MOD A4C:     75.0 ml LV SV MOD BP:      49.1 ml  RIGHT VENTRICLE          IVC RV Basal diam:  2.80 cm  IVC diam: 1.00 cm RV Mid diam:    2.20 cm  LEFT ATRIUM             Index        RIGHT ATRIUM          Index LA Vol (A2C):   22.5 ml 14.71 ml/m  RA Area:     8.98 cm LA Vol (A4C):   33.0 ml 21.58 ml/m  RA Volume:   18.50 ml 12.10 ml/m LA Biplane Vol: 30.2 ml 19.75  ml/m  Kardie Tobb DO Electronically signed by Dub Huntsman DO Signature Date/Time: 05/14/2024/8:00:12 AM    Final          ______________________________________________________________________________________________      Risk Assessment/Calculations           Physical Exam VS:  BP 130/70   Pulse 82   Ht 5' 4 (1.626 m)   Wt 109 lb 6.4 oz (49.6 kg)   SpO2 97%   BMI 18.78 kg/m        Wt Readings from Last 3 Encounters:  06/07/24 109 lb 6.4 oz (49.6 kg)  05/13/24 111 lb 15.9 oz (50.8 kg)  05/12/24 112  lb (50.8 kg)    GEN: Well nourished, well developed in no acute distress. Sitting comfortably on the exam table  NECK: No JVD  CARDIAC:  RRR, no murmurs, rubs, gallops. Radial pulses 2+ bilaterally  RESPIRATORY:  Clear to auscultation without rales, wheezing or rhonchi. Normal WOB on room air   ABDOMEN: Soft, non-tender, non-distended EXTREMITIES:  No edema in BLE; No deformity   ASSESSMENT AND PLAN  Recent CVA Aortic Valve Mass  - Admitted from 10/15-10/23 with CVA. Echocardiogram that admission showed EF 55-60%, no regional wall motion abnormalities, normal LV diastolic parameters, normal RV systolic function and size.  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  However in the setting of stroke recommended TEE - Ordered TEE  - Cardiac monitor pending- patient wore the monitor at SNF after her discharge. While there, she completed the monitor and SNF staff was suppose to mail it back. Monitor has not yet been mailed back. We have contacted SNF to find monitor  - LDL 47 - continue lipitor 10 mg daily  - Continue ASA 81 mg daily. Continue plavix 75 mg daily for 2 more days (to complete 3 weeks of DAPT)  - Has follow up with neurology next week   HTN  - BP well controlled. 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 Milan and she is willing to proceed.      Dispo: Follow up pending results of TEE, cardiac monitor   Signed, Rollo FABIENE Louder, PA-C

## 2024-05-31 NOTE — Transitions of Care (Post Inpatient/ED Visit) (Signed)
 05/31/2024  Name: Michele Tyler MRN: [id number] DOB: 20-Jul-1946  Today's TOC FU Call Status: Today's TOC FU Call Status:: Successful TOC FU Call Completed TOC FU Call Complete Date: 05/31/24 Patient's Name and Date of Birth confirmed.  Transition Care Management Follow-up Telephone Call Date of Discharge: 05/28/24 Discharge Facility: Other ([facility]) Name of Other ([facility]) Discharge Facility: [facility] Type of Discharge: Inpatient Admission Primary Inpatient Discharge Diagnosis:: cerebral infarction How have you been since you were released from the hospital?: Better Any questions or concerns?: Yes Patient Questions/Concerns:: patient would like rx for nausea medication. Patient also wants referra to neuro Patient Questions/Concerns Addressed: Notified Provider of Patient Questions/Concerns  Items Reviewed: Did you receive and understand the discharge instructions provided?: Yes Medications obtained,verified, and reconciled?: Yes (Medications Reviewed) Any new allergies since your discharge?: No Dietary orders reviewed?: Yes Do you have support at home?: Yes People in Home [RPT]: friend(s)  Medications Reviewed Today: Medications Reviewed Today     Reviewed by Emmitt Pan, LPN (Licensed Practical Nurse) on 05/31/24 at [phone]  Med List Status: <None>   Medication Order Taking? Sig Documenting Provider Last Dose Status Informant  Apple Cider Vinegar 600 MG CAPS [id number] Yes Take 600 mg by mouth daily with breakfast. [provider]  Active Self  Ascorbic Acid (VITAMIN C CR) 1500 MG TBCR [id number] Yes Take 1,500 mg by mouth daily. [provider]  Active Self  aspirin EC 81 MG tablet [id number] Yes Take 1 tablet (81 mg total) by mouth daily. Swallow whole. Briana Elgin LABOR, MD  Active   atorvastatin  (LIPITOR) 10 MG tablet [id number] Yes TAKE 1 TABLET BY MOUTH AT  BEDTIME [name], MD  Active   B Complex-Folic Acid  (BALANCED B-150 PO)  [id number] Yes Take 1 capsule by mouth daily. [provider]  Active   buPROPion  (WELLBUTRIN  XL) 150 MG 24 hr tablet [id number]  TAKE 1 TABLET BY MOUTH DAILY  Patient not taking: Reported on 05/31/2024   [name], MD  Active Self  busPIRone  (BUSPAR ) 7.5 MG tablet [id number] Yes TAKE 1 TABLET BY MOUTH 3 TIMES  DAILY  Patient taking differently: Take 7.5 mg by mouth 3 (three) times daily as needed (for anxiety).   [name], MD  Active Self  CALCIUM  MAGNESIUM ZINC PO [id number] Yes Take 1 tablet by mouth in the morning. [provider]  Active Self  Cholecalciferol (VITAMIN D3) 5000 units CAPS [id number] Yes Take 5,000 Units by mouth daily with breakfast. [provider]  Active Self  clopidogrel (PLAVIX) 75 MG tablet [id number] Yes Take 1 tablet (75 mg total) by mouth daily for 12 days. Briana Elgin LABOR, MD  Active   Cyanocobalamin (B-12) 1000 MCG CAPS [id number] Yes Take 1,000 mcg by mouth in the morning. [provider]  Active Self  feeding supplement (ENSURE PLUS HIGH PROTEIN) LIQD [id number] Yes Take 237 mLs by mouth 2 (two) times daily between meals. Briana Elgin LABOR, MD  Active   Ferrous Sulfate (IRON) 325 (65 Fe) MG TABS [id number] Yes Take 325 mg by mouth daily with breakfast. [provider]  Active Self  folic acid  (FOLVITE ) 800 MCG tablet [id number] Yes Take 800 mcg by mouth daily. [provider]  Active Self  losartan  (COZAAR ) 100 MG tablet [id number] Yes Take 1 tablet (100 mg total) by mouth daily. Briana Elgin LABOR, MD  Active   Omega-3 Fatty Acids (OMEGA-3 FISH OIL PO) [id number] Yes Take 4,000 mg by mouth daily. [provider]  Active Self  Soy Isoflavone 750 MG CAPS [id number] Yes Take 750 mg by mouth daily. [provider]  Active Self  TURMERIC PO [id number] Yes Take 800 mg by mouth daily. [provider]  Active Self  vitamin A 8000 UNIT capsule [id number] Yes Take 8,000 Units by mouth daily. [provider]  Active Self  VITAMIN E-1000 PO [id number] Yes Take 1,000 Units by mouth daily. [provider]  Active Self  Vitamins-Lipotropics (BALANCED B-150 COMPLEX TR PO) [id number] Yes Take 1 tablet by mouth See admin instructions. Take 1 tablet by mouth once a day (contains b-1 @ 150 mg, b-2 150 mg, b-6 150 mg, b-12 @ 150 mg, and niacin 150 mg) [provider]  Active Self            Home Care and Equipment/Supplies: Were Home Health Services Ordered?: Yes Name of Home Health Agency:: unknown Has Agency set up a time to come to your home?: No Any new equipment or medical supplies ordered?: NA  Functional Questionnaire: Do you need assistance with bathing/showering or dressing?: No Do you need assistance with meal preparation?: No Do you need assistance with eating?: No Do you have difficulty maintaining continence: No Do you need assistance with getting out of bed/getting out of a chair/moving?: No Do you have difficulty managing or taking your medications?: No  Follow up appointments reviewed: PCP Follow-up appointment confirmed?: Yes Date of PCP follow-up appointment?: 06/09/24 Follow-up Provider: [facility] Follow-up appointment confirmed?: NA Do you need transportation to your follow-up appointment?: No Do you understand care options if your condition(s) worsen?: Yes-patient verbalized understanding    SIGNATURE Julian Lemmings, LPN [facility] Nurse Health Advisor Direct Dial [phone]

## 2024-05-31 NOTE — Telephone Encounter (Signed)
 Called patient she is advised and agreeable to recommendation

## 2024-05-31 NOTE — Telephone Encounter (Signed)
 Rx for zofran  sent. Referral to neurology has already been placed and is pending

## 2024-06-02 ENCOUNTER — Telehealth: Payer: Self-pay

## 2024-06-02 NOTE — Telephone Encounter (Signed)
 Copied from CRM #[id number]. Topic: Clinical - Home Health Verbal Orders >> Jun 02, 2024  8:38 AM Rea ORN wrote: Caller/Agency: Sandra/[facility]  Callback Number: [phone] Service Requested: Skilled Nursing Frequency: Weekly x3, every other week x3  Any new concerns about the patient? No

## 2024-06-02 NOTE — Telephone Encounter (Signed)
 Ok to give verbal order if they need it

## 2024-06-02 NOTE — Telephone Encounter (Signed)
 Called [facility] for Nena to contact the office if she call please advised

## 2024-06-03 NOTE — Telephone Encounter (Signed)
 Called Nena she is advised

## 2024-06-07 ENCOUNTER — Encounter: Payer: Self-pay | Admitting: Cardiology

## 2024-06-07 ENCOUNTER — Telehealth: Payer: Self-pay

## 2024-06-07 ENCOUNTER — Ambulatory Visit: Attending: Cardiology | Admitting: Cardiology

## 2024-06-07 VITALS — BP 130/70 | HR 82 | Ht 64.0 in | Wt 109.4 lb

## 2024-06-07 DIAGNOSIS — E871 Hypo-osmolality and hyponatremia: Secondary | ICD-10-CM | POA: Diagnosis not present

## 2024-06-07 DIAGNOSIS — I10 Essential (primary) hypertension: Secondary | ICD-10-CM

## 2024-06-07 DIAGNOSIS — I639 Cerebral infarction, unspecified: Secondary | ICD-10-CM | POA: Diagnosis not present

## 2024-06-07 NOTE — Telephone Encounter (Signed)
 Copied from CRM [id number]. Topic: General - Billing Inquiry >> Jun 07, 2024 10:27 AM Harlene ORN wrote: Reason for CRM: Nurse Nena [facility] Called requesting for documentation of the residual effects of the patient's stroke, for her home health services and billing department. Transferred nurse to office to discuss. Phone: [phone]

## 2024-06-07 NOTE — Telephone Encounter (Signed)
 I spoke with [facility] this morning and informed her that provider has saw the patient since she was Hospitalized for the stroke so it would be hard for provider to provide that information advised her that patients has an appt on the 12th and would be able to know better after that appt.

## 2024-06-07 NOTE — Patient Instructions (Signed)
 Medication Instructions:  Your physician recommends that you continue on your current medications as directed. Please refer to the Current Medication list given to you today.  *If you need a refill on your cardiac medications before your next appointment, please call your pharmacy*  Lab Work: None ordered  If you have labs (blood work) drawn today and your tests are completely normal, you will receive your results only by: MyChart Message (if you have MyChart) OR A paper copy in the mail If you have any lab test that is abnormal or we need to change your treatment, we will call you to review the results.  Testing/Procedures: Your physician has requested that you have a TEE. During a TEE, sound waves are used to create images of your heart. It provides your doctor with information about the size and shape of your heart and how well your heart's chambers and valves are working. In this test, a transducer is attached to the end of a flexible tube that is guided down you throat and into your esophagus (the tube leading from your mouth to your stomach) to get a more detailed image of your heart. Once the TEE has determined that a blood clot is not present, the cardioversion begins. Electrical Cardioversion uses a jolt of electricity to your heart either through paddles or wired patches attached to your chest. This is a controlled, usually prescheduled, procedure. This procedure is done at the hospital and you are not awake during the procedure. You usually go home the day of the procedure. Please see the instructions BELOW:           Dear Michele Tyler  You are scheduled for a TEE (Transesophageal Echocardiogram) on Friday, November 14 with Dr. LONI.  Please arrive at the [facility] (Main Entrance A) at [facility]: [address] [place], [place] [address] at 10:30 AM (This time is 1 hour(s) before your procedure to ensure your preparation).   Free valet parking service is  available. You will check in at ADMITTING.   *Please Note: You will receive a call the day before your procedure to confirm the appointment time. That time may have changed from the original time based on the schedule for that day.*    DIET:  Nothing to eat or drink after midnight except a sip of water with medications (see medication instructions below)  MEDICATION INSTRUCTIONS: !!IF ANY NEW MEDICATIONS ARE STARTED AFTER TODAY, PLEASE NOTIFY YOUR PROVIDER AS SOON AS POSSIBLE!!  FYI: Medications such as Semaglutide (Ozempic, Wegovy), Tirzepatide (Mounjaro, Zepbound), Dulaglutide (Trulicity), etc (GLP1 agonists) AND Canagliflozin (Invokana), Dapagliflozin (Farxiga), Empagliflozin (Jardiance), Ertugliflozin (Steglatro), Bexagliflozin [employer]) or any combination with one of these drugs such as Invokamet (Canagliflozin/Metformin), Synjardy (Empagliflozin/Metformin), etc (SGLT2 inhibitors) must be held around the time of a procedure. This is not a comprehensive list of all of these drugs. Please review all of your medications and talk to your provider if you take any one of these. If you are not sure, ask your provider.        LABS:     FYI:  For your safety, and to allow us  to monitor your vital signs accurately during the surgery/procedure we request: If you have artificial nails, gel coating, SNS etc, please have those removed prior to your surgery/procedure. Not having the nail coverings /polish removed may result in cancellation or delay of your surgery/procedure.  Your support person will be asked to wait in the waiting room during your procedure.  It is  OK to have someone drop you off and come back when you are ready to be discharged.  You cannot drive after the procedure and will need someone to drive you home.  Bring your insurance cards.  *Special Note: Every effort is made to have your procedure done on time. Occasionally there are emergencies that occur at the hospital that may  cause delays. Please be patient if a delay does occur.     Follow-Up: At [facility], you and your health needs are our priority.  As part of our continuing mission to provide you with exceptional heart care, our providers are all part of one team.  This team includes your primary Cardiologist (physician) and Advanced Practice Providers or APPs (Physician Assistants and Nurse Practitioners) who all work together to provide you with the care you need, when you need it.  Your next appointment:   DEPENDING UPON TEE RESULTS.   Provider:   [name], MD    We recommend signing up for the patient portal called MyChart.  Sign up information is provided on this After Visit Summary.  MyChart is used to connect with patients for Virtual Visits (Telemedicine).  Patients are able to view lab/test results, encounter notes, upcoming appointments, etc.  Non-urgent messages can be sent to your provider as well.   To learn more about what you can do with MyChart, go to [email or web address].   Other Instructions

## 2024-06-09 ENCOUNTER — Ambulatory Visit (INDEPENDENT_AMBULATORY_CARE_PROVIDER_SITE_OTHER): Admitting: Family Medicine

## 2024-06-09 ENCOUNTER — Encounter: Payer: Self-pay | Admitting: Family Medicine

## 2024-06-09 ENCOUNTER — Telehealth: Payer: Self-pay

## 2024-06-09 ENCOUNTER — Ambulatory Visit: Admitting: Family Medicine

## 2024-06-09 VITALS — BP 155/76 | HR 83 | Ht 64.0 in | Wt 110.1 lb

## 2024-06-09 DIAGNOSIS — I639 Cerebral infarction, unspecified: Secondary | ICD-10-CM

## 2024-06-09 DIAGNOSIS — E871 Hypo-osmolality and hyponatremia: Secondary | ICD-10-CM

## 2024-06-09 DIAGNOSIS — I10 Essential (primary) hypertension: Secondary | ICD-10-CM | POA: Diagnosis not present

## 2024-06-09 DIAGNOSIS — K5900 Constipation, unspecified: Secondary | ICD-10-CM | POA: Diagnosis not present

## 2024-06-09 MED ORDER — ONDANSETRON HCL 4 MG PO TABS: 4.0000 mg | ORAL_TABLET | Freq: Three times a day (TID) | ORAL | 1 refills | Status: DC | PRN

## 2024-06-09 MED ORDER — OLMESARTAN MEDOXOMIL 40 MG PO TABS: 40.0000 mg | ORAL_TABLET | Freq: Every day | ORAL | 1 refills | Status: DC

## 2024-06-09 NOTE — Assessment & Plan Note (Addendum)
 History of vertebral artery stroke. 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 There is concern for thrombus or vegetation given the recent stroke. Patient is feeling fatigued and wishes to postpone the TEE. Currently on aspirin, was on Plavix for 21 days post-stroke. Neurologist appointment is next Thursday. - pt wished to delay the TEE d/t fatigue/exhaustion.  Advised if it was possible to delay by 1-2 weeks it would likely be safe, but would recommend going through with it this week if it would cause a delay longer than a few weeks.  - Advised that the TEE is important for evaluating the heart mass, and the results of the heart monitor or the neurology consultation are unlikely to change the recommendation for the TEE. - Continue aspirin.  Patient has had an initial evaluation with PT. Nursing is visiting for vitals. OT and Speech therapy have been ordered but not yet scheduled. - Advised caregiver to call [facility] to confirm schedule for OT and speech therapy.

## 2024-06-09 NOTE — Telephone Encounter (Signed)
 Please call back for approval of pt orders.

## 2024-06-09 NOTE — Telephone Encounter (Signed)
 Spoke to patient's caregiver April she stated patient needs to reschedule TEE scheduled for this Friday 11/14. TEE rescheduled to Wed 11/19 Arrive at 11:00 am.

## 2024-06-09 NOTE — Assessment & Plan Note (Signed)
 BP elevated today. Previously on losartan /HCTZ. HCTZ was stopped due to sodium concerns. Was switched to olmesartan  20 mg, which was tolerated well for a month prior to hospitalization. Was switched back to losartan  100 mg (max dose) in the hospital. - Stop losartan . - Start olmesartan  (Benicar ) 20 mg daily. The existing 20mg  tablets can be used. Will prescribe olmesartan  40 mg tablets. Start with one-half tablet (20 mg) daily for two weeks. If BP remains high, increase to one full tablet (40 mg) daily.

## 2024-06-09 NOTE — Progress Notes (Signed)
 Established Patient Office Visit  Subjective   Patient ID: Michele Tyler, female    DOB: 08-12-1945  Age: 78 y.o. MRN: [id number]  Chief Complaint  Patient presents with   Hospitalization Follow-up    HPI  Subjective - Follow-up post-discharge from skilled rehab facility. Accompanied by caregiver. Discharged one and a half weeks ago. - Reports cognitive decline, specifically word-finding difficulties (aphasia), such as transposing words like glasses and phone. - Home health services have started. Physical therapy evaluation completed yesterday. No PT, OT, or speech therapy has been administered yet. Caregiver notes services are scheduled for the end of the month, but the PT who evaluated her yesterday is attempting to expedite services. - Reports feeling fatigued and not up to a scheduled TEE for this Friday. Would like to postpone it. - Reports constipation, with bowel movements every 4-5 days, leading to some nausea.  Medications Current medications include atorvastatin  (Lipitor), B vitamins, vitamin C, aspirin, vitamin D , calcium , and losartan . Hydrochlorothiazide has been stopped. Wellbutrin  has been stopped. Caregiver requested a refill for ondansetron  (Zofran ) for nausea. Plavix was taken for 21 days total post-stroke and has been stopped. Takes Dulcolax occasionally for constipation.  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 Social Hx: Lives with caregiver support. Discharged from skilled rehab facility. Receiving home health services including nursing and therapies (PT, OT, speech).  ROS Neuro: Positive for word-finding difficulties/aphasia and cognitive decline. Denies dizziness with olmesartan . GI: Positive for constipation and associated nausea. Constitutional: Positive for fatigue.   The ASCVD Risk score (Arnett DK, et al., 2019) failed to calculate  for the following reasons:   Risk score cannot be calculated because patient has a medical history suggesting prior/existing ASCVD  Health Maintenance Due  Topic Date Due   Hepatitis C Screening  Never done   Zoster Vaccines- Shingrix (1 of 2) Never done   Pneumococcal Vaccine: 50+ Years (2 of 2 - PCV) 09/07/2014   Medicare Annual Wellness (AWV)  01/09/2024   Influenza Vaccine  02/27/2024   COVID-19 Vaccine (6 - 2025-26 season) 03/29/2024      Objective:     BP (!) 155/76   Pulse 83   Ht 5' 4 (1.626 m)   Wt 110 lb 1.9 oz (50 kg)   SpO2 99%   BMI 18.90 kg/m    Physical Exam Gen: alert, oriented Cv: rrr, no murmur Pulm: no respiratory distress Msk: ambulates with walker.  Slowed, deliberate gait  Psych: pleasant affect    Assessment & Plan:   Hyponatremia -     Basic metabolic panel with GFR -     Osmolality  Hypertension, goal below 150/90 Assessment & Plan: BP elevated today. Previously on losartan /HCTZ. HCTZ was stopped due to sodium concerns. Was switched to olmesartan  20 mg, which was tolerated well for a month prior to hospitalization. Was switched back to losartan  100 mg (max dose) in the hospital. - Stop losartan . - Start olmesartan  (Benicar ) 20 mg daily. The existing 20mg  tablets can be used. Will prescribe olmesartan  40 mg tablets. Start with one-half tablet (20 mg) daily for two weeks. If BP remains high, increase to one full tablet (40 mg) daily.   Constipation, unspecified constipation type Assessment & Plan: Reports bowel movements every 4-5 days with associated nausea. Uses Dulcolax occasionally. - Start Miralax, one capful in 4 oz of water daily. - If no bowel movement after 2 days, increase to two capfuls daily. -  Continue to increase dose by one capful each day until a bowel movement occurs. Goal is a soft bowel movement every 1-2 days. - Once a bowel movement occurs, return to one capful daily dose. - Prescribed ondansetron  (Zofran ) for  nausea.   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 There is concern for thrombus or vegetation given the recent stroke. Patient is feeling fatigued and wishes to postpone the TEE. Currently on aspirin, was on Plavix for 21 days post-stroke. Neurologist appointment is next Thursday. - pt wished to delay the TEE d/t fatigue/exhaustion.  Advised if it was possible to delay by 1-2 weeks it would likely be safe, but would recommend going through with it this week if it would cause a delay longer than a few weeks.  - Advised that the TEE is important for evaluating the heart mass, and the results of the heart monitor or the neurology consultation are unlikely to change the recommendation for the TEE. - Continue aspirin.  Patient has had an initial evaluation with PT. Nursing is visiting for vitals. OT and Speech therapy have been ordered but not yet scheduled. - Advised caregiver to call [facility] to confirm schedule for OT and speech therapy.    Other orders -     Olmesartan  Medoxomil; Take 1 tablet (40 mg total) by mouth daily. Take 1/2 tablet daily for the first two weeks  Dispense: 90 tablet; Refill: 1 -     Ondansetron  HCl; Take 1 tablet (4 mg total) by mouth every 8 (eight) hours as needed for nausea or vomiting.  Dispense: 20 tablet; Refill: 1     Return in about 2 months (around 08/09/2024) for stroke, bp.    Toribio MARLA Slain, MD

## 2024-06-09 NOTE — Telephone Encounter (Signed)
 Called Mallie from [facility] for verbal orders per PCP

## 2024-06-09 NOTE — Telephone Encounter (Signed)
 Copied from CRM [id number]. Topic: Clinical - Home Health Verbal Orders >> Jun 09, 2024  1:24 PM Gustabo D wrote: Caller/Agency: Mallie Pizza with [facility] Callback Number: [phone]- confidential vm Service Requested: Physical Therapy Frequency: Once a week for 8 weeks Any new concerns about the patient? No

## 2024-06-09 NOTE — Assessment & Plan Note (Signed)
 Reports bowel movements every 4-5 days with associated nausea. Uses Dulcolax occasionally. - Start Miralax, one capful in 4 oz of water daily. - If no bowel movement after 2 days, increase to two capfuls daily. - Continue to increase dose by one capful each day until a bowel movement occurs. Goal is a soft bowel movement every 1-2 days. - Once a bowel movement occurs, return to one capful daily dose. - Prescribed ondansetron  (Zofran ) for nausea.

## 2024-06-09 NOTE — Patient Instructions (Signed)
 It was nice to see you today,  We addressed the following topics today: - You can postpone your TEE procedure if you can reschedule it within the next week or two. If the next available appointment is months away, I recommend you keep the Friday appointment. Please call the cardiologist's office to reschedule. - Stop taking the losartan . I will prescribe olmesartan  instead.  - I will send a prescription for olmesartan  40 mg tablets. Start by taking one-half tablet (20 mg) each day. You can take a full tablet of your current olmesartan  20 mg prescription until the new one is filled. After two weeks, if your blood pressure is still high, you can increase the dose to one full 40 mg tablet daily. - For constipation, take one capful of Miralax powder mixed in a small glass of water every day. - If you do not have a bowel movement for two days, increase the Miralax to two capfuls the next day. You can keep increasing the dose by one capful each day you don't have a bowel movement. - Once you have a bowel movement, go back to taking just one capful per day. - I have sent a prescription for Zofran  to help with the nausea. - Please go to the lab to have your blood drawn to check your sodium levels. - Please call the home health agency ([facility]) to confirm when the occupational therapist and speech therapist will be coming.  Have a great day,  Rolan Slain, MD

## 2024-06-09 NOTE — Telephone Encounter (Signed)
 Pt and her caregiver would like a c/b regarding upcoming procedure on 11/14. Pt states that she will need to reschedule for another date. Please advise

## 2024-06-11 DIAGNOSIS — I639 Cerebral infarction, unspecified: Secondary | ICD-10-CM

## 2024-06-15 LAB — BASIC METABOLIC PANEL WITH GFR
BUN/Creatinine Ratio: 23 (ref 12–28)
BUN: 12 mg/dL (ref 8–27)
CO2: 26 mmol/L (ref 20–29)
Calcium: 9.3 mg/dL (ref 8.7–10.3)
Chloride: 85 mmol/L — ABNORMAL LOW (ref 96–106)
Creatinine, Ser: 0.52 mg/dL — ABNORMAL LOW (ref 0.57–1.00)
Glucose: 93 mg/dL (ref 70–99)
Potassium: 4.2 mmol/L (ref 3.5–5.2)
Sodium: 125 mmol/L — ABNORMAL LOW (ref 134–144)
eGFR: 96 mL/min/1.73 (ref >59)

## 2024-06-15 LAB — OSMOLALITY: Osmolality Meas: 257 mosm/kg — ABNORMAL LOW (ref 280–301)

## 2024-06-15 NOTE — Progress Notes (Signed)
 Pt called for pre procedure instructions. Instructions given to caregiver April. Arrival time 1030.  May be late appointment with neurologist in the am as well.   NPO after midnight explained Instructed to take am meds with sip of water and confirmed blood thinner consistency Instructed pt need for ride home tomorrow and have responsible adult with them for 24 hrs post procedure.

## 2024-06-16 ENCOUNTER — Ambulatory Visit (HOSPITAL_COMMUNITY)
Admission: RE | Admit: 2024-06-16 | Discharge: 2024-06-16 | Disposition: A | Source: Ambulatory Visit | Attending: Cardiovascular Disease | Admitting: Cardiovascular Disease

## 2024-06-16 ENCOUNTER — Encounter (HOSPITAL_COMMUNITY)
Admission: RE | Disposition: A | Discharge status: Home / Self Care | Source: Home / Self Care | Attending: Cardiovascular Disease

## 2024-06-16 ENCOUNTER — Other Ambulatory Visit: Payer: Self-pay

## 2024-06-16 ENCOUNTER — Telehealth: Payer: Self-pay

## 2024-06-16 ENCOUNTER — Ambulatory Visit (HOSPITAL_COMMUNITY)
Admission: RE | Admit: 2024-06-16 | Discharge: 2024-06-16 | Disposition: A | Discharge status: Home / Self Care | Attending: Cardiovascular Disease | Admitting: Cardiovascular Disease

## 2024-06-16 ENCOUNTER — Encounter: Payer: Self-pay | Admitting: Neurology

## 2024-06-16 ENCOUNTER — Ambulatory Visit: Admitting: Neurology

## 2024-06-16 ENCOUNTER — Ambulatory Visit (HOSPITAL_COMMUNITY)

## 2024-06-16 ENCOUNTER — Ambulatory Visit (HOSPITAL_BASED_OUTPATIENT_CLINIC_OR_DEPARTMENT_OTHER)

## 2024-06-16 VITALS — BP 140/85 | HR 87 | Ht 64.0 in | Wt 112.0 lb

## 2024-06-16 DIAGNOSIS — I358 Other nonrheumatic aortic valve disorders: Secondary | ICD-10-CM | POA: Insufficient documentation

## 2024-06-16 DIAGNOSIS — Z87891 Personal history of nicotine dependence: Secondary | ICD-10-CM | POA: Insufficient documentation

## 2024-06-16 DIAGNOSIS — I639 Cerebral infarction, unspecified: Secondary | ICD-10-CM

## 2024-06-16 DIAGNOSIS — Z7982 Long term (current) use of aspirin: Secondary | ICD-10-CM | POA: Diagnosis not present

## 2024-06-16 DIAGNOSIS — Z7902 Long term (current) use of antithrombotics/antiplatelets: Secondary | ICD-10-CM | POA: Diagnosis not present

## 2024-06-16 DIAGNOSIS — E785 Hyperlipidemia, unspecified: Secondary | ICD-10-CM | POA: Insufficient documentation

## 2024-06-16 DIAGNOSIS — Z8673 Personal history of transient ischemic attack (TIA), and cerebral infarction without residual deficits: Secondary | ICD-10-CM | POA: Insufficient documentation

## 2024-06-16 DIAGNOSIS — I10 Essential (primary) hypertension: Secondary | ICD-10-CM

## 2024-06-16 DIAGNOSIS — Z79899 Other long term (current) drug therapy: Secondary | ICD-10-CM | POA: Diagnosis not present

## 2024-06-16 DIAGNOSIS — E871 Hypo-osmolality and hyponatremia: Secondary | ICD-10-CM | POA: Insufficient documentation

## 2024-06-16 HISTORY — PX: TRANSESOPHAGEAL ECHOCARDIOGRAM (CATH LAB): EP1270

## 2024-06-16 LAB — POCT I-STAT, CHEM 8
BUN: 8 mg/dL (ref 8–23)
Calcium, Ion: 1.21 mmol/L (ref 1.15–1.40)
Chloride: 94 mmol/L — ABNORMAL LOW (ref 98–111)
Creatinine, Ser: 0.7 mg/dL (ref 0.44–1.00)
Glucose, Bld: 89 mg/dL (ref 70–99)
HCT: 37 % (ref 36.0–46.0)
Hemoglobin: 12.6 g/dL (ref 12.0–15.0)
Potassium: 3.9 mmol/L (ref 3.5–5.1)
Sodium: 134 mmol/L — ABNORMAL LOW (ref 135–145)
TCO2: 27 mmol/L (ref 22–32)

## 2024-06-16 LAB — ECHO TEE

## 2024-06-16 SURGERY — TRANSESOPHAGEAL ECHOCARDIOGRAM (TEE) (CATHLAB)
Anesthesia: Monitor Anesthesia Care

## 2024-06-16 MED ORDER — SODIUM CHLORIDE 0.9 % IV SOLN: INTRAVENOUS | Status: DC | PRN

## 2024-06-16 MED ORDER — SODIUM CHLORIDE 0.9 % IV SOLN: INTRAVENOUS | Status: DC

## 2024-06-16 MED ORDER — PROPOFOL 10 MG/ML IV BOLUS
INTRAVENOUS | Status: DC | PRN
  Administered 2024-06-16 (×2): 20 mg via INTRAVENOUS
  Administered 2024-06-16: 80 ug/kg/min via INTRAVENOUS

## 2024-06-16 MED ORDER — OXYCODONE HCL 5 MG PO TABS: 5.0000 mg | ORAL_TABLET | Freq: Once | ORAL | Status: DC | PRN

## 2024-06-16 MED ORDER — OXYCODONE HCL 5 MG/5ML PO SOLN: 5.0000 mg | Freq: Once | ORAL | Status: DC | PRN

## 2024-06-16 MED ORDER — FENTANYL CITRATE (PF) 100 MCG/2ML IJ SOLN: 25.0000 ug | INTRAMUSCULAR | Status: DC | PRN

## 2024-06-16 MED ORDER — ONDANSETRON HCL 4 MG/2ML IJ SOLN: 4.0000 mg | Freq: Once | INTRAMUSCULAR | Status: DC | PRN

## 2024-06-16 MED ORDER — AMISULPRIDE (ANTIEMETIC) 5 MG/2ML IV SOLN: 10.0000 mg | Freq: Once | INTRAVENOUS | Status: DC | PRN

## 2024-06-16 MED ORDER — ACETAMINOPHEN 10 MG/ML IV SOLN: 1000.0000 mg | Freq: Once | INTRAVENOUS | Status: DC | PRN

## 2024-06-16 MED ORDER — LIDOCAINE 2% (20 MG/ML) 5 ML SYRINGE
INTRAMUSCULAR | Status: DC | PRN
Start: 2024-06-16 — End: 2024-06-16
  Administered 2024-06-16: 60 mg via INTRAVENOUS

## 2024-06-16 NOTE — Discharge Instructions (Signed)

## 2024-06-16 NOTE — Telephone Encounter (Signed)
 Copied from CRM #[id number]. Topic: Clinical - Home Health Verbal Orders >> Jun 16, 2024  3:22 PM Anairis L wrote: Caller/Agency: [facility]  Callback Number: [phone] Secure VM  Service Requested: Occupational Therapy Frequency: 2x for 7 weeks  Any new concerns about the patient? No     Called [facility] spoke to Moira gave verbal order Per Dr. Chandra

## 2024-06-16 NOTE — Progress Notes (Signed)
  Echocardiogram Echocardiogram Transesophageal has been performed.  LAMON MAXWELL 06/16/2024, 11:46 AM

## 2024-06-16 NOTE — Interval H&P Note (Signed)
 History and Physical Interval Note:  06/16/2024 11:24 AM  Michele Tyler  has presented today for surgery, with the diagnosis of stroke.  The various methods of treatment have been discussed with the patient and family. After consideration of risks, benefits and other options for treatment, the patient has consented to  Procedure(s): TRANSESOPHAGEAL ECHOCARDIOGRAM (N/A) as a surgical intervention.  The patient's history has been reviewed, patient examined, no change in status, stable for surgery.  I have reviewed the patient's chart and labs.  Questions were answered to the patient's satisfaction.     Maude Emmer

## 2024-06-16 NOTE — Transfer of Care (Signed)
 Immediate Anesthesia Transfer of Care Note  Patient: Michele Tyler  Procedure(s) Performed: TRANSESOPHAGEAL ECHOCARDIOGRAM  Patient Location: PACU  Anesthesia Type:MAC  Level of Consciousness: drowsy  Airway & Oxygen Therapy: Patient Spontanous Breathing  Post-op Assessment: Report given to RN  Post vital signs: stable  Last Vitals:  Vitals Value Taken Time  BP    Temp    Pulse    Resp    SpO2      Last Pain:  Vitals:   06/16/24 1035  TempSrc: Temporal         Complications: No notable events documented.

## 2024-06-16 NOTE — Patient Instructions (Signed)
 Continue aspirin  81 mg daily Strict management of vascular risk factors with a goal BP less than 130/90, A1c less than 7.0, LDL less than 70 for secondary stroke prevention Follow with cardiology with TEE today, cardiac monitor Follow up in 6 months

## 2024-06-16 NOTE — Anesthesia Postprocedure Evaluation (Signed)
 Anesthesia Post Note  Patient: Michele Tyler  Procedure(s) Performed: TRANSESOPHAGEAL ECHOCARDIOGRAM     Patient location during evaluation: Endoscopy Anesthesia Type: MAC Level of consciousness: awake Pain management: pain level controlled Vital Signs Assessment: post-procedure vital signs reviewed and stable Respiratory status: spontaneous breathing Cardiovascular status: blood pressure returned to baseline Postop Assessment: no apparent nausea or vomiting Anesthetic complications: no   No notable events documented.  Last Vitals:  Vitals:   06/16/24 1203 06/16/24 1213  BP: 131/78 (!) 157/88  Pulse: 87 75  Resp: 14 18  Temp:    SpO2: 96% 98%    Last Pain:  Vitals:   06/16/24 1213  TempSrc:   PainSc: 0-No pain                 Lauraine KATHEE [place]

## 2024-06-16 NOTE — Progress Notes (Signed)
 Patient: Michele Tyler Date of Birth: 1946-04-27  Reason for Visit: Stroke Clinic Follow Up  History from: Patient, caregiver April  Primary Neurologist: Rosemarie  ASSESSMENT AND PLAN 78 y.o. 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  Presented with dizziness, ataxia and generalized weakness.  Vascular risk factors: HTN, HLD.  Today, mild ataxia with right upper and lower extremities, mild gait/balance impairment.  - Having TEE today - Completed cardiac monitor, but results may have been misplaced, follow up with cardiology about repeating, will CC cardiology  - Continue aspirin 81 mg daily for secondary stroke prevention - Start home health PT, OT - Strict management of vascular risk factors with a goal BP less than 130/90, A1c less than 7.0, LDL less than 70 for secondary stroke prevention - Follow-up in 6 months or sooner if needed  HISTORY OF PRESENT ILLNESS: Today 06/16/24 Here today with caregiver, April. Scheduled for TEE today. Prior to CVA event, living alone, completely independent. Mentions at home before she went to the ER, her BP was very high in the 180's. She has completed cardiac monitor, reports results were lost? She wore it for 10 days. After discharge, went to rehab, [facility] for 1 week. Then went back home with caregiver, who is her neighbor. Home health has been out to assess but hasn't started yet, going to have OT, PT, ST. Since CVA, difficulty with cognitive skills for fine motor skills to work cell phone, TV remote. Walking, balance is improving. 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 Prior had no issues with memory. Remains on aspirin 81 mg daily well. BP 140/85, on olmesartan .  Is no longer getting her words mixed up (calling glasses her phone).   HISTORY  Admitted for dizziness, ataxia and generalized weakness 05/13/24. Her LKW was 05/07/24.   Outside of the window for TNK.  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  See HPI  ALLERGIES: Allergies  Allergen Reactions   Codeine Nausea Only    HOME MEDICATIONS: Outpatient Medications Prior to Visit  Medication Sig Dispense Refill   Ascorbic Acid (VITAMIN C CR) 1500 MG TBCR Take 1,500 mg by mouth daily.     aspirin EC 81 MG tablet Take 1 tablet (81 mg total) by mouth daily. Swallow whole.     atorvastatin  (LIPITOR) 10 MG tablet TAKE 1 TABLET BY MOUTH AT  BEDTIME 100 tablet 2   B Complex-Folic Acid  (BALANCED B-150 PO) Take 1 capsule by mouth daily.     busPIRone  (BUSPAR ) 7.5 MG tablet TAKE 1 TABLET BY MOUTH 3 TIMES  DAILY (Patient taking differently: Take  7.5 mg by mouth at bedtime.) 300 tablet 0   CALCIUM  MAGNESIUM ZINC PO Take 1 tablet by mouth in the morning.     Cholecalciferol (VITAMIN D3) 5000 units CAPS Take 5,000 Units by mouth daily with breakfast.     Cyanocobalamin (B-12) 1000 MCG CAPS Take 1,000 mcg by mouth in the morning.     Ferrous Sulfate (IRON) 325 (65 Fe) MG TABS Take 325 mg by mouth daily with breakfast.     folic acid  (FOLVITE ) 800 MCG tablet Take 800 mcg by mouth daily.      olmesartan  (BENICAR ) 40 MG tablet Take 1 tablet (40 mg total) by mouth daily. Take 1/2 tablet daily for the first two weeks 90 tablet 1   Omega-3 Fatty Acids (OMEGA-3 FISH OIL PO) Take 4,000 mg by mouth daily.     ondansetron  (ZOFRAN ) 4 MG tablet Take 1 tablet (4 mg total) by mouth every 8 (eight) hours as needed for nausea or vomiting. 20 tablet 1   polyethylene glycol powder (GLYCOLAX/MIRALAX) 17 GM/SCOOP powder Take 17 g by mouth daily. Dissolve 1 capful (17g) in 4-8 ounces of liquid and take by mouth daily.     vitamin A 8000 UNIT capsule Take 8,000 Units by mouth daily.     VITAMIN E-1000 PO Take 1,000 Units by mouth daily.     Vitamins-Lipotropics (BALANCED B-150 COMPLEX TR PO) Take 1 tablet by mouth See admin instructions. Take 1 tablet by mouth once a day (contains b-1 @ 150 mg, b-2 150 mg, b-6 150 mg, b-12 @ 150 mg, and niacin 150 mg)     No facility-administered medications prior to visit.    PAST MEDICAL HISTORY: Past Medical History:  Diagnosis Date   Anxiety    Depression    Hypertension    Stroke (cerebrum) (HCC)     PAST SURGICAL HISTORY: Past Surgical History:  Procedure Laterality Date   DILATION AND CURETTAGE OF UTERUS  2002   FACELIFT  2005    FAMILY HISTORY: Family History  Problem Relation Age of Onset   Hypertension Mother    Depression Father    Hypertension Father     SOCIAL HISTORY: Social History   Socioeconomic History   Marital status: Divorced    Spouse name: Not on file   Number of children: Not on file   Years of education: Not on file   Highest education level: Master's degree (e.g., MA, MS, MEng, MEd, MSW, MBA)  Occupational History   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  Tobacco Use   Smoking status: Former    Current packs/day: 0.00    Average packs/day: 1 pack/day for 10.0 years (10.0 ttl pk-yrs)    Types: Cigarettes    Start date: 07/29/1976    Quit date: 1988    Years since quitting: 37.9   Smokeless tobacco: Never  Vaping Use    Vaping status: Never Used  Substance and Sexual Activity   Alcohol use: Yes    Alcohol/week: 1.0 - 2.0 standard drink of alcohol    Types: 1 - 2 Standard drinks or equivalent per week    Comment: social   Drug use: No   Sexual activity: Not Currently  Other Topics Concern   Not on file  Social History Narrative   Not on file   Social Drivers of Health   Financial Resource Strain: Low Risk  (08/04/2023)   Overall Financial Resource Strain (CARDIA)    Difficulty of Paying Living Expenses: Not hard at all  Food Insecurity: No Food Insecurity (05/13/2024)   Hunger Vital Sign    Worried About Running Out of Food in the Last Year: Never true    Ran Out of Food in the Last Year: Never true  Transportation Needs: No Transportation Needs (05/13/2024)   PRAPARE - Administrator, Civil Service (Medical): No    Lack of Transportation (Non-Medical): No  Physical Activity: Sufficiently Active (08/04/2023)   Exercise Vital Sign    Days of Exercise per Week: 6 days    Minutes of Exercise per Session: 90 min  Stress: Stress Concern Present (08/04/2023)   [employer] of Occupational Health - Occupational Stress Questionnaire    Feeling of Stress : Very much  Social Connections: Moderately Isolated (05/13/2024)   Social Connection and Isolation Panel    Frequency of Communication with Friends and Family: Three times a week    Frequency of Social Gatherings with Friends and Family: Once a week    Attends Religious Services: Never    Database Administrator or Organizations: Yes    Attends Banker Meetings: 1 to 4 times per year    Marital Status: Divorced  Intimate Partner Violence: Not At Risk (05/13/2024)   Humiliation, Afraid, Rape, and Kick questionnaire    Fear of Current or Ex-Partner: No    Emotionally Abused: No    Physically Abused: No    Sexually Abused: No   PHYSICAL EXAM  Vitals:   06/16/24 0915  BP: (!) 140/85  Pulse: 87  SpO2: 96%  Weight:  112 lb (50.8 kg)  Height: 5' 4 (1.626 m)   Body mass index is 19.22 kg/m.  Generalized: Well developed, in no acute distress Neurological examination  Mentation: Alert oriented to time, place, history taking. Follows all commands speech and language fluent Cranial nerve II-XII: Pupils were equal round reactive to light. Extraocular movements were full, visual field were full on confrontational test. Facial sensation and strength were normal. Head turning and shoulder shrug  were normal and symmetric. No word finding trouble noted.  Motor: Mild weakness right arm and leg  Sensory: Sensory testing is intact to soft touch on all 4 extremities. No evidence of extinction is noted.  Coordination: Slight dysmetria with right finger to nose and slower than with the left. Right heel to shin is accurate but slow.  Gait and station: Gait is wide based, cautious, can walk few steps without walker Reflexes: Deep tendon reflexes are symmetric and normal bilaterally.   DIAGNOSTIC DATA (LABS, IMAGING, TESTING) - I reviewed patient records, labs, notes, testing and imaging myself where available.  Lab Results  Component Value Date   WBC 7.5 05/13/2024   HGB 11.5 (L) 05/13/2024   HCT 35.2 (L) 05/13/2024   MCV 85.2 05/13/2024   PLT 439 (H) 05/13/2024      Component Value Date/Time   NA 125 (L) 06/09/2024 1144   K 4.2 06/09/2024 1144   CL 85 (L) 06/09/2024 1144   CO2 26 06/09/2024 1144   GLUCOSE 93 06/09/2024 1144   GLUCOSE 98 05/16/2024 0700   BUN 12 06/09/2024 1144   CREATININE 0.52 (L) 06/09/2024 1144   CALCIUM  9.3 06/09/2024 1144   PROT 7.2 05/12/2024 1420   PROT 6.5 05/10/2024 1432   ALBUMIN 4.5 05/12/2024 1420   ALBUMIN 4.3 05/10/2024 1432   AST 44 (H) 05/12/2024 1420   ALT 18 05/12/2024 1420   ALKPHOS 94 05/12/2024 1420   BILITOT 0.4 05/12/2024 1420   BILITOT  0.3 05/10/2024 1432   GFRNONAA >60 05/16/2024 0700   GFRAA 106 07/05/2020 0829   Lab Results  Component Value Date    CHOL 124 05/13/2024   HDL 63 05/13/2024   LDLCALC 47 05/13/2024   TRIG 75 05/13/2024   CHOLHDL 2.0 05/13/2024   Lab Results  Component Value Date   HGBA1C 5.5 05/12/2024   Lab Results  Component Value Date   VITAMINB12 880 05/13/2024   Lab Results  Component Value Date   TSH 0.957 05/10/2024    Lauraine Born, AGNP-C, DNP 06/16/2024, 9:21 AM [facility] [address] [place], [place] [address] [phone]

## 2024-06-16 NOTE — CV Procedure (Signed)
 TEE Anesthesia: Propofol  EF 60% Normal RV Trivial MR AV sclerosis small area of nodular calcification on right cusp. Doubt this Represents a papilloma/mass No ASD/PFO Negative bubble study No effusion No LAA thormbus  Maude Emmer MD [facility]

## 2024-06-16 NOTE — Anesthesia Preprocedure Evaluation (Addendum)
 Anesthesia Evaluation  Patient identified by MRN, date of birth, ID band Patient awake    Reviewed: Allergy & Precautions, NPO status , Patient's Chart, lab work & pertinent test results  Airway Mallampati: I  TM Distance: >3 FB Neck ROM: Full    Dental  (+) Teeth Intact, Dental Advisory Given   Pulmonary former smoker   breath sounds clear to auscultation       Cardiovascular hypertension,  Rhythm:Regular Rate:Normal  TTE (04/2024): 1. Left ventricular ejection fraction, by estimation, is 55 to 60%. The  left ventricle has normal function. The left ventricle has no regional  wall motion abnormalities. Left ventricular diastolic parameters were  normal.   2. Right ventricular systolic function is normal. The right ventricular  size is normal.   3. The mitral valve is normal in structure. Trivial mitral valve  regurgitation. No evidence of mitral stenosis.   4. There appears to be a mobile echolucent mass on the right coronary  cusp which is likely calcification however in the setting of a stroke TEE  will be beneficial for clarity. The aortic valve is normal in structure.  Aortic valve regurgitation is not  visualized. No aortic stenosis is present.   5. The inferior vena cava is normal in size with greater than 50%  respiratory variability, suggesting right atrial pressure of 3 mmHg.      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 (Na 125)  Renal/GU      Musculoskeletal   Abdominal   Peds  Hematology   Anesthesia Other Findings   Reproductive/Obstetrics                              Anesthesia Physical Anesthesia Plan  ASA: 3  Anesthesia Plan: MAC   Post-op Pain Management:    Induction: Intravenous  PONV Risk Score and Plan: 2 and Ondansetron , Dexamethasone and Treatment may vary due to age or medical condition  Airway  Management Planned: Natural Airway and Nasal Cannula  Additional Equipment:   Intra-op Plan:   Post-operative Plan:   Informed Consent:      Dental advisory given  Plan Discussed with: CRNA  Anesthesia Plan Comments:          Anesthesia Quick Evaluation

## 2024-06-17 ENCOUNTER — Encounter (HOSPITAL_COMMUNITY): Payer: Self-pay | Admitting: Cardiovascular Disease

## 2024-06-18 ENCOUNTER — Ambulatory Visit: Payer: Self-pay | Admitting: Family Medicine

## 2024-06-18 NOTE — Progress Notes (Signed)
 Called patient she is advised of her recommendation she stated that she will stop by Monday for a urine sample

## 2024-06-21 ENCOUNTER — Other Ambulatory Visit: Payer: Self-pay | Admitting: Family Medicine

## 2024-06-21 ENCOUNTER — Other Ambulatory Visit

## 2024-06-21 DIAGNOSIS — E871 Hypo-osmolality and hyponatremia: Secondary | ICD-10-CM

## 2024-06-21 NOTE — Addendum Note (Signed)
 Addended by: ZIMMERMAN RUMPLE, Deion Forgue D on: 06/21/2024 08:17 AM   Modules accepted: Orders

## 2024-06-21 NOTE — Progress Notes (Signed)
 I agree with the above plan

## 2024-06-22 ENCOUNTER — Emergency Department (HOSPITAL_COMMUNITY)

## 2024-06-22 ENCOUNTER — Other Ambulatory Visit: Payer: Self-pay

## 2024-06-22 ENCOUNTER — Observation Stay (HOSPITAL_COMMUNITY)

## 2024-06-22 ENCOUNTER — Observation Stay (HOSPITAL_COMMUNITY)
Admission: EM | Admit: 2024-06-22 | Discharge: 2024-06-24 | Disposition: A | Discharge status: Home / Self Care | Attending: Internal Medicine | Admitting: Internal Medicine

## 2024-06-22 ENCOUNTER — Ambulatory Visit: Payer: Self-pay

## 2024-06-22 DIAGNOSIS — R41841 Cognitive communication deficit: Secondary | ICD-10-CM | POA: Diagnosis not present

## 2024-06-22 DIAGNOSIS — Z79899 Other long term (current) drug therapy: Secondary | ICD-10-CM | POA: Insufficient documentation

## 2024-06-22 DIAGNOSIS — I10 Essential (primary) hypertension: Secondary | ICD-10-CM | POA: Diagnosis not present

## 2024-06-22 DIAGNOSIS — M6281 Muscle weakness (generalized): Secondary | ICD-10-CM | POA: Diagnosis not present

## 2024-06-22 DIAGNOSIS — Z7902 Long term (current) use of antithrombotics/antiplatelets: Secondary | ICD-10-CM | POA: Insufficient documentation

## 2024-06-22 DIAGNOSIS — R519 Headache, unspecified: Secondary | ICD-10-CM | POA: Diagnosis present

## 2024-06-22 DIAGNOSIS — I639 Cerebral infarction, unspecified: Secondary | ICD-10-CM | POA: Diagnosis not present

## 2024-06-22 DIAGNOSIS — E785 Hyperlipidemia, unspecified: Secondary | ICD-10-CM | POA: Insufficient documentation

## 2024-06-22 DIAGNOSIS — D500 Iron deficiency anemia secondary to blood loss (chronic): Secondary | ICD-10-CM | POA: Insufficient documentation

## 2024-06-22 DIAGNOSIS — I6340 Cerebral infarction due to embolism of unspecified cerebral artery: Secondary | ICD-10-CM | POA: Diagnosis not present

## 2024-06-22 DIAGNOSIS — Z7982 Long term (current) use of aspirin: Secondary | ICD-10-CM | POA: Diagnosis not present

## 2024-06-22 DIAGNOSIS — N1330 Unspecified hydronephrosis: Secondary | ICD-10-CM | POA: Diagnosis not present

## 2024-06-22 DIAGNOSIS — R29702 NIHSS score 2: Secondary | ICD-10-CM | POA: Diagnosis not present

## 2024-06-22 DIAGNOSIS — R188 Other ascites: Secondary | ICD-10-CM | POA: Diagnosis not present

## 2024-06-22 DIAGNOSIS — I6310 Cerebral infarction due to embolism of unspecified precerebral artery: Secondary | ICD-10-CM

## 2024-06-22 DIAGNOSIS — J90 Pleural effusion, not elsewhere classified: Secondary | ICD-10-CM | POA: Insufficient documentation

## 2024-06-22 DIAGNOSIS — R2681 Unsteadiness on feet: Secondary | ICD-10-CM | POA: Diagnosis not present

## 2024-06-22 DIAGNOSIS — R2689 Other abnormalities of gait and mobility: Secondary | ICD-10-CM | POA: Diagnosis not present

## 2024-06-22 DIAGNOSIS — Z87891 Personal history of nicotine dependence: Secondary | ICD-10-CM | POA: Insufficient documentation

## 2024-06-22 DIAGNOSIS — E871 Hypo-osmolality and hyponatremia: Secondary | ICD-10-CM | POA: Diagnosis not present

## 2024-06-22 LAB — CBC
HCT: 35.4 % — ABNORMAL LOW (ref 36.0–46.0)
Hemoglobin: 11.6 g/dL — ABNORMAL LOW (ref 12.0–15.0)
MCH: 27.6 pg (ref 26.0–34.0)
MCHC: 32.8 g/dL (ref 30.0–36.0)
MCV: 84.3 fL (ref 80.0–100.0)
Platelets: 579 K/uL — ABNORMAL HIGH (ref 150–400)
RBC: 4.2 MIL/uL (ref 3.87–5.11)
RDW: 13 % (ref 11.5–15.5)
WBC: 7.5 K/uL (ref 4.0–10.5)
nRBC: 0 % (ref 0.0–0.2)

## 2024-06-22 LAB — COMPREHENSIVE METABOLIC PANEL WITH GFR
ALT: 14 U/L (ref 0–44)
AST: 21 U/L (ref 15–41)
Albumin: 3.1 g/dL — ABNORMAL LOW (ref 3.5–5.0)
Alkaline Phosphatase: 81 U/L (ref 38–126)
Anion gap: 8 (ref 5–15)
BUN: 8 mg/dL (ref 8–23)
CO2: 27 mmol/L (ref 22–32)
Calcium: 8.9 mg/dL (ref 8.9–10.3)
Chloride: 95 mmol/L — ABNORMAL LOW (ref 98–111)
Creatinine, Ser: 0.59 mg/dL (ref 0.44–1.00)
GFR, Estimated: >60 mL/min (ref >60)
Glucose, Bld: 100 mg/dL — ABNORMAL HIGH (ref 70–99)
Potassium: 4.1 mmol/L (ref 3.5–5.1)
Sodium: 130 mmol/L — ABNORMAL LOW (ref 135–145)
Total Bilirubin: 0.5 mg/dL (ref 0.0–1.2)
Total Protein: 6.3 g/dL — ABNORMAL LOW (ref 6.5–8.1)

## 2024-06-22 LAB — LIPASE, BLOOD: Lipase: 33 U/L (ref 11–51)

## 2024-06-22 LAB — OSMOLALITY: Osmolality: 276 mosm/kg (ref 275–295)

## 2024-06-22 LAB — VITAMIN B12: Vitamin B-12: 626 pg/mL (ref 180–914)

## 2024-06-22 LAB — PROTIME-INR
INR: 1 (ref 0.8–1.2)
Prothrombin Time: 13.8 s (ref 11.4–15.2)

## 2024-06-22 LAB — TSH: TSH: 0.817 u[IU]/mL (ref 0.350–4.500)

## 2024-06-22 LAB — URIC ACID: Uric Acid, Serum: 3.3 mg/dL (ref 2.5–7.1)

## 2024-06-22 MED ORDER — FOLIC ACID 1 MG PO TABS
1.0000 mg | ORAL_TABLET | Freq: Every day | ORAL | Status: DC
  Administered 2024-06-22 – 2024-06-24 (×3): 1 mg via ORAL
  Filled 2024-06-22 (×4): qty 1

## 2024-06-22 MED ORDER — ACETAMINOPHEN 500 MG PO TABS
1000.0000 mg | ORAL_TABLET | Freq: Once | ORAL | Status: AC
  Administered 2024-06-22: 1000 mg via ORAL
  Filled 2024-06-22: qty 2

## 2024-06-22 MED ORDER — FLUORESCEIN SODIUM 1 MG OP STRP
1.0000 | ORAL_STRIP | Freq: Once | OPHTHALMIC | Status: AC
  Administered 2024-06-22: 1 via OPHTHALMIC
  Filled 2024-06-22: qty 1

## 2024-06-22 MED ORDER — HEPARIN SODIUM (PORCINE) 5000 UNIT/ML IJ SOLN
5000.0000 [IU] | Freq: Three times a day (TID) | INTRAMUSCULAR | Status: DC
  Administered 2024-06-22 – 2024-06-24 (×6): 5000 [IU] via SUBCUTANEOUS
  Filled 2024-06-22 (×6): qty 1

## 2024-06-22 MED ORDER — IOHEXOL 350 MG/ML SOLN
50.0000 mL | Freq: Once | INTRAVENOUS | Status: AC | PRN
  Administered 2024-06-22: 50 mL via INTRAVENOUS

## 2024-06-22 MED ORDER — ACETAMINOPHEN 325 MG PO TABS
650.0000 mg | ORAL_TABLET | ORAL | Status: DC | PRN
  Administered 2024-06-23: 650 mg via ORAL
  Filled 2024-06-22: qty 2

## 2024-06-22 MED ORDER — POLYVINYL ALCOHOL 1.4 % OP SOLN: 1.0000 [drp] | OPHTHALMIC | Status: DC | PRN

## 2024-06-22 MED ORDER — POLYETHYLENE GLYCOL 3350 17 G PO PACK
17.0000 g | PACK | Freq: Every day | ORAL | Status: DC
  Administered 2024-06-23 – 2024-06-24 (×2): 17 g via ORAL
  Filled 2024-06-22 (×2): qty 1

## 2024-06-22 MED ORDER — ACETAMINOPHEN 650 MG RE SUPP: 650.0000 mg | RECTAL | Status: DC | PRN

## 2024-06-22 MED ORDER — VITAMIN B-12 1000 MCG PO TABS
1000.0000 ug | ORAL_TABLET | Freq: Every morning | ORAL | Status: DC
  Administered 2024-06-23 – 2024-06-24 (×2): 1000 ug via ORAL
  Filled 2024-06-22 (×2): qty 1

## 2024-06-22 MED ORDER — ASPIRIN 81 MG PO TBEC
81.0000 mg | DELAYED_RELEASE_TABLET | Freq: Every day | ORAL | Status: DC
  Administered 2024-06-23 – 2024-06-24 (×3): 81 mg via ORAL
  Filled 2024-06-22 (×3): qty 1

## 2024-06-22 MED ORDER — TETRACAINE HCL 0.5 % OP SOLN
1.0000 [drp] | Freq: Once | OPHTHALMIC | Status: AC
  Administered 2024-06-22: 1 [drp] via OPHTHALMIC
  Filled 2024-06-22: qty 4

## 2024-06-22 MED ORDER — ACETAMINOPHEN 160 MG/5ML PO SOLN: 650.0000 mg | ORAL | Status: DC | PRN

## 2024-06-22 MED ORDER — SODIUM CHLORIDE 0.9 % IV SOLN: INTRAVENOUS | Status: DC

## 2024-06-22 MED ORDER — STROKE: EARLY STAGES OF RECOVERY BOOK
Freq: Once | Status: AC
  Filled 2024-06-22: qty 1

## 2024-06-22 MED ORDER — FLUORESCEIN SODIUM 1 MG OP STRP
1.0000 | ORAL_STRIP | Freq: Once | OPHTHALMIC | Status: DC
  Filled 2024-06-22 (×2): qty 1

## 2024-06-22 MED ORDER — GATIFLOXACIN 0.5 % OP SOLN
1.0000 [drp] | Freq: Four times a day (QID) | OPHTHALMIC | Status: DC
  Administered 2024-06-22 – 2024-06-24 (×7): 1 [drp] via OPHTHALMIC
  Filled 2024-06-22: qty 2.5

## 2024-06-22 MED ORDER — ATORVASTATIN CALCIUM 10 MG PO TABS
10.0000 mg | ORAL_TABLET | Freq: Every day | ORAL | Status: DC
  Administered 2024-06-22 – 2024-06-23 (×2): 10 mg via ORAL
  Filled 2024-06-22 (×4): qty 1

## 2024-06-22 MED ORDER — BUSPIRONE HCL 15 MG PO TABS
7.5000 mg | ORAL_TABLET | Freq: Three times a day (TID) | ORAL | Status: DC
  Administered 2024-06-22 – 2024-06-24 (×5): 7.5 mg via ORAL
  Filled 2024-06-22 (×7): qty 1

## 2024-06-22 MED ORDER — TETRACAINE HCL 0.5 % OP SOLN
1.0000 [drp] | Freq: Once | OPHTHALMIC | Status: AC
  Administered 2024-06-23: 1 [drp] via OPHTHALMIC
  Filled 2024-06-22 (×2): qty 4

## 2024-06-22 MED ORDER — SENNOSIDES-DOCUSATE SODIUM 8.6-50 MG PO TABS: 1.0000 | ORAL_TABLET | Freq: Every evening | ORAL | Status: DC | PRN

## 2024-06-22 MED ORDER — OMEGA-3-ACID ETHYL ESTERS 1 G PO CAPS
4.0000 | ORAL_CAPSULE | Freq: Every day | ORAL | Status: DC
  Administered 2024-06-23 – 2024-06-24 (×2): 4 g via ORAL
  Filled 2024-06-22 (×2): qty 4

## 2024-06-22 MED ORDER — ERYTHROMYCIN 5 MG/GM OP OINT
1.0000 | TOPICAL_OINTMENT | Freq: Once | OPHTHALMIC | Status: AC
  Administered 2024-06-23: 1 via OPHTHALMIC
  Filled 2024-06-22: qty 1
  Filled 2024-06-22: qty 3.5

## 2024-06-22 NOTE — ED Provider Triage Note (Signed)
 Emergency Medicine Provider Triage Evaluation Note  Michele Tyler , a 78 y.o. female  was evaluated in triage.  Pt complains of headache.  Started yesterday, gradual onset.  Left side of head and behind left eye.  Was having eye pain as well last night and improved this morning.  Redness to the left eye with discharge noted this morning.  Took Tylenol   Review of Systems  Positive: Headache Negative: Aphasia, dysarthria, weakness  Physical Exam  BP (!) 160/88 (BP Location: Left Arm)   Pulse 86   Temp 97.7 F (36.5 C) (Oral)   Resp 16   Ht 5' 4 (1.626 m)   Wt 50.8 kg   SpO2 98%   BMI 19.22 kg/m  Gen:   Awake, no distress   Resp:  Normal effort \ MSK:   Moves extremities without difficulty  Other:  Cranial nerves intact.  Normal sensation, equal strength.  Coordinated movements. Eye:   Left eye with conjunctival injection, but appears to be purulent type drainage in the medial epicanthal fold.  No obvious foreign bodies.  Pupils equal round reactive to light.  Pain-free EOM  Medical Decision Making  Medically screening exam initiated at 12:17 PM.  Appropriate orders placed.  Michele Tyler was informed that the remainder of the evaluation will be completed by another provider, this initial triage assessment does not replace that evaluation, and the importance of remaining in the ED until their evaluation is complete.  78 year old presented with headache; was also having eye pain.  Left eye is injected.  Will get screening labs, CT head.  Ordered tetracaine  and tonometer to check eye pressures for glaucoma    Neysa Caron PARAS, DO 06/22/24 1217

## 2024-06-22 NOTE — ED Provider Notes (Signed)
 [place] EMERGENCY DEPARTMENT AT [facility] Provider Note   CSN: [id number] Arrival date & time: 06/22/24  1149     Patient presents with: Headache   Michele Tyler is a 78 y.o. female.    Headache Associated symptoms: eye pain   Patient presents for headache.  Medical history includes CVA, HLD, HTN, anxiety.  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  Symptoms at the time included dizziness and unsteadiness.  She was placed on 3 weeks of DAPT with plan for indefinite aspirin .  Yesterday, she had onset of pain behind her left eye.  Pain was improved this morning.  She did note left eye redness with discharge.  She took Tylenol  prior to arrival.  Currently, she denies any headache.  She denies any visual change to her left eye.     Prior to Admission medications   Medication Sig Start Date End Date Taking? Authorizing Provider  Ascorbic Acid (VITAMIN C CR) 1500 MG TBCR Take 1,500 mg by mouth daily.    [provider]  aspirin  EC 81 MG tablet Take 1 tablet (81 mg total) by mouth daily. Swallow whole. 05/20/24   Briana Elgin LABOR, MD  atorvastatin  (LIPITOR) 10 MG tablet TAKE 1 TABLET BY MOUTH AT  BEDTIME 05/19/24   [name], MD  B Complex-Folic Acid  (BALANCED B-150 PO) Take 1 capsule by mouth daily.    [provider]  busPIRone  (BUSPAR ) 7.5 MG tablet TAKE 1 TABLET BY MOUTH 3 TIMES  DAILY Patient taking differently: Take 7.5 mg by mouth at bedtime. 12/25/23   [name], MD  CALCIUM  MAGNESIUM ZINC PO Take 1 tablet by mouth in the morning.    [provider]  Cholecalciferol  (VITAMIN D3) 5000 units CAPS Take 5,000 Units by mouth daily with breakfast.    [provider]  Cyanocobalamin  (B-12) 1000 MCG CAPS Take 1,000 mcg by mouth in the morning.    [provider]  Ferrous Sulfate  (IRON) 325 (65 Fe) MG TABS Take 325 mg by mouth daily with breakfast.    [provider]  folic acid  (FOLVITE ) 800 MCG tablet  Take 800 mcg by mouth daily.    [provider]  olmesartan  (BENICAR ) 40 MG tablet Take 1 tablet (40 mg total) by mouth daily. Take 1/2 tablet daily for the first two weeks 06/09/24   [name], MD  Omega-3 Fatty Acids (OMEGA-3 FISH OIL PO) Take 4,000 mg by mouth daily.    [provider]  ondansetron  (ZOFRAN ) 4 MG tablet Take 1 tablet (4 mg total) by mouth every 8 (eight) hours as needed for nausea or vomiting. 06/09/24   [name], MD  polyethylene glycol powder (GLYCOLAX /MIRALAX ) 17 GM/SCOOP powder Take 17 g by mouth daily. Dissolve 1 capful (17g) in 4-8 ounces of liquid and take by mouth daily.    [provider]  vitamin A 8000 UNIT capsule Take 8,000 Units by mouth daily.    [provider]  VITAMIN E-1000 PO Take 1,000 Units by mouth daily.    [provider]  Vitamins-Lipotropics (BALANCED B-150 COMPLEX TR PO) Take 1 tablet by mouth See admin instructions. Take 1 tablet by mouth once a day (contains b-1 @ 150 mg, b-2 150 mg, b-6 150 mg, b-12 @ 150 mg, and niacin 150 mg)    [provider]    Allergies: Codeine    Review of Systems  Eyes:  Positive for pain, discharge and redness.  Neurological:  Positive for headaches.  All other systems reviewed and are negative.   Updated Vital Signs BP (!) 180/88 (BP Location: Left Arm)   Pulse 82   Temp 98 F (36.7 C)   Resp (!) 21   Ht 5' 4 (1.626 m)   Wt 50.8 kg   SpO2 100%   BMI 19.22 kg/m   Physical Exam Vitals and nursing note reviewed.  Constitutional:      General: She is not in acute distress.    Appearance: Normal appearance. She is well-developed. She is not ill-appearing, toxic-appearing or diaphoretic.  HENT:     Head: Normocephalic and atraumatic.     Right Ear: External ear normal.     Left Ear: External ear normal.     Nose: Nose normal.     Mouth/Throat:     Mouth: Mucous membranes are moist.  Eyes:     General:        Left eye: Discharge  present.    Extraocular Movements: Extraocular movements intact.     Pupils: Pupils are equal, round, and reactive to light.     Comments: Left eye conjunctival injection with small amount of purulent discharge.  Cardiovascular:     Rate and Rhythm: Normal rate and regular rhythm.  Pulmonary:     Effort: Pulmonary effort is normal. No respiratory distress.  Abdominal:     General: There is no distension.     Palpations: Abdomen is soft.     Tenderness: There is no abdominal tenderness.  Musculoskeletal:        General: No swelling. Normal range of motion.     Cervical back: Normal range of motion and neck supple.  Skin:    General: Skin is warm and dry.     Capillary Refill: Capillary refill takes less than 2 seconds.  Neurological:     Mental Status: She is alert and oriented to person, place, and time.     Sensory: No sensory deficit.     Motor: No weakness.     Coordination: Coordination abnormal.  Psychiatric:        Mood and Affect: Mood normal.        Behavior: Behavior normal.     (all labs ordered are listed, but only abnormal results are displayed) Labs Reviewed  CBC - Abnormal; Notable for the following components:      Result Value   Hemoglobin 11.6 (*)    HCT 35.4 (*)    Platelets 579 (*)    All other components within normal limits  COMPREHENSIVE METABOLIC PANEL WITH GFR - Abnormal; Notable for the following components:   Sodium 130 (*)    Chloride 95 (*)    Glucose, Bld 100 (*)    Total Protein 6.3 (*)    Albumin  3.1 (*)    All other components within normal limits  LIPASE, BLOOD  PROTIME-INR  OSMOLALITY  URIC ACID  LIPID PANEL  CBC WITH DIFFERENTIAL/PLATELET  BASIC METABOLIC PANEL WITH GFR  MAGNESIUM  UREA NITROGEN, URINE  OSMOLALITY, URINE  SODIUM, URINE, RANDOM  TSH  VITAMIN B12    EKG: None  Radiology: MR BRAIN WO CONTRAST Result Date: 06/22/2024 EXAM: MRI BRAIN WITHOUT CONTRAST 06/22/2024 03:56:29 PM TECHNIQUE: Multiplanar  multisequence MRI of the head/brain was performed without the administration of intravenous contrast. COMPARISON: Same day CT head and MRI head 05/12/2024. CLINICAL HISTORY: Stroke, follow up. FINDINGS: BRAIN AND VENTRICLES: There is a focus of acute infarct within the posterior left cerebellum involving a  portion of the left PICA territory. There are numerous additional scattered areas of infarct involving the cortex and subcortical white matter within the bilateral frontal and parietal lobes. Additional areas of more ill-defined diffusion signal abnormality in the cortex primarily of the right middle frontal gyrus and in the left middle frontal gyrus without definite restricted diffusion which may reflect areas of subacute infarct. There are scattered and confluent areas of T2 and FLAIR hyperintensity in the periventricular and subcortical white matter with additional areas of signal abnormality in the pons compatible with extensive chronic microvascular ischemic changes. There are remote lacunar infarcts in the bilateral basal ganglia and thalami. Additional areas of remote cortical infarct in the lateral right temporal lobe and in the bilateral occipital lobes. Additional regions of remote infarct and prior petechial hemorrhage in the right frontal lobe. Redemonstrated now remote infarcts in the right cerebellum. No acute intracranial hemorrhage. No mass. No midline shift. No hydrocephalus. The sella is unremarkable. Normal flow voids. ORBITS: No acute abnormality. SINUSES AND MASTOIDS: No acute abnormality. BONES AND SOFT TISSUES: Normal marrow signal. No acute soft tissue abnormality. IMPRESSION: 1. Acute infarct in the posterior left cerebellum involving a portion of the left PICA territory. 2. Numerous additional scattered acute cortical and subcortical infarcts in the bilateral frontal and parietal lobes which are new from prior. 3. Additional bilateral subacute cortical infarcts corresponding to areas of  infarct on the prior MRI. 4. Extensive chronic microvascular ischemic changes and remote infarcts as above. Electronically signed by: Donnice Mania MD 06/22/2024 04:20 PM EST RP Workstation: [id number]   CT Head Wo Contrast Result Date: 06/22/2024 EXAM: CT HEAD WITHOUT CONTRAST 06/22/2024 12:33:00 PM TECHNIQUE: CT of the head was performed without the administration of intravenous contrast. Automated exposure control, iterative reconstruction, and/or weight based adjustment of the mA/kV was utilized to reduce the radiation dose to as low as reasonably achievable. COMPARISON: CT head 05/12/2024 CLINICAL HISTORY: Headache, new onset (Age >= 51y) FINDINGS: BRAIN AND VENTRICLES: No acute hemorrhage. Since October 15, new/interval acute or subacute left cerebellar infarct. Additional remote bilateral cerebellar infarcts. Remote left thalamic lacunar infarct. Remote right posterior temporal infarct. Patchy white matter hypodensities, compatible with chronic microvascular ischemic changes. No hydrocephalus. No extra-axial collection. No mass effect or midline shift. ORBITS: No acute abnormality. SINUSES: No acute abnormality. SOFT TISSUES AND SKULL: No skull fracture. IMPRESSION: 1. Since October 15, new/interval acute or subacute left cerebellar infarct. Recommend MRI head. 2. Additional remote infarcts and chronic microvascular ischemic change. Electronically signed by: Gilmore Molt MD 06/22/2024 12:40 PM EST RP Workstation: [id number]     Procedures   Medications Ordered in the ED  erythromycin  ophthalmic ointment 1 Application (has no administration in time range)  tetracaine  (PONTOCAINE) 0.5 % ophthalmic solution 1 drop (has no administration in time range)  fluorescein  ophthalmic strip 1 strip (has no administration in time range)  aspirin  EC tablet 81 mg (has no administration in time range)  atorvastatin  (LIPITOR) tablet 10 mg (has no administration in time range)  cyanocobalamin  (VITAMIN B12)  tablet 1,000 mcg (has no administration in time range)  folic acid  (FOLVITE ) tablet 1 mg (has no administration in time range)  omega-3 acid ethyl esters (LOVAZA ) capsule 4 g (has no administration in time range)  polyethylene glycol (MIRALAX  / GLYCOLAX ) packet 17 g (has no administration in time range)   stroke: early stages of recovery book (has no administration in time range)  acetaminophen  (TYLENOL ) tablet 650 mg (has no administration in time range)  Or  acetaminophen  (TYLENOL ) 160 MG/5ML solution 650 mg (has no administration in time range)    Or  acetaminophen  (TYLENOL ) suppository 650 mg (has no administration in time range)  senna-docusate (Senokot-S) tablet 1 tablet (has no administration in time range)  heparin  injection 5,000 Units (has no administration in time range)  tetracaine  (PONTOCAINE) 0.5 % ophthalmic solution 1 drop (1 drop Left Eye Given 06/22/24 1248)  fluorescein  ophthalmic strip 1 strip (1 strip Left Eye Given 06/22/24 1249)  acetaminophen  (TYLENOL ) tablet 1,000 mg (1,000 mg Oral Given 06/22/24 1248)    Clinical Course as of 06/22/24 1856  Tue Jun 22, 2024  1313 Left eye pressure with tonometer 14. Acute glaucoma unlikely. No uptake with fluorescein  [TY]    Clinical Course User Index [TY] Neysa Caron PARAS, DO                                 Medical Decision Making Risk Prescription drug management. Decision regarding hospitalization.   This patient presents to the ED for concern of left eye redness and pain, this involves an extensive number of treatment options, and is a complaint that carries with it a high risk of complications and morbidity.  The differential diagnosis includes corneal ration, corneal ulcer, conjunctivitis, foreign body, iritis   Co morbidities / Chronic conditions that complicate the patient evaluation  CVA, HLD, HTN, anxiety   Additional history obtained:  Additional history obtained from EMR External records from outside  source obtained and reviewed including N/A   Lab Tests:  I Ordered, and personally interpreted labs.  The pertinent results include: Normal hemoglobin, no leukocytosis, normal kidney function, baseline hyponatremia   Imaging Studies ordered:  I ordered imaging studies including CT head, MRI brain I independently visualized and interpreted imaging which showed acute infarct in posterior left cerebellum in addition to scattered numerous acute cortical and subcortical infarcts bilaterally I agree with the radiologist interpretation   Cardiac Monitoring: / EKG:  The patient was maintained on a cardiac monitor.  I personally viewed and interpreted the cardiac monitored which showed an underlying rhythm of: Sinus rhythm   Problem List / ED Course / Critical interventions / Medication management  Patient presenting for left-sided headache and eye pain.  She denies any visual change to her left eye.  She noted eye redness and discharge today.  Prior to being bedded in the ED, patient was evaluated in triage.  IOP's were reportedly normal.  She underwent a noncontrasted CT scan of head which showed new left cerebellar infarct.  This prompted MRI study.  MRI shows acute infarct in posterior left cerebellum in addition to scattered acute cortical and subcortical infarcts in bilateral hemispheres.  On exam, patient has left eye redness and discharge, consistent with conjunctivitis.  Erythromycin  ointment to be ordered.  On neuroexam, she has right sided dysmetria of upper and lower extremity.  Neurology was consulted.  I spoke with neurologist on-call, Dr. Nichola, who recommends admission for further stroke workup.  Patient underwent exam with fluorescein  stain.  She does have an area of fluorescein  uptake at 9:00 concerning for possible corneal ulcer.  I spoke with ophthalmologist, Dr. Lavonia, who will see her in consult.  Patient was admitted for further management. I ordered medication including  erythromycin  ointment for conjunctivitis Reevaluation of the patient after these medicines showed that the patient stayed the same I have reviewed the patients home medicines and have made adjustments  as needed   Consultations Obtained:  I requested consultation with the neurologist, Dr. Nichola,  and discussed lab and imaging findings as well as pertinent plan - they recommend: Admission for stroke workup I requested consultation with the ophthalmologist, Dr. Lavonia,  and discussed lab and imaging findings as well as pertinent plan - they recommend: Will see in consult   Social Determinants of Health:  Has PCP      Final diagnoses:  Cerebrovascular accident (CVA), unspecified mechanism [facility])    ED Discharge Orders     None          Melvenia Motto, MD 06/22/24 1856

## 2024-06-22 NOTE — Consult Note (Signed)
 Ophthalmology Consult  Michele Tyler is a 78 y.o. female with history of HTN, HLD, recent cardio embolic stroke presenting with left eye pain and headache.   The left eye pain is a foreign body sensation and has had a weeping discharge from the eye.  There was a stain of the cornea in the left eye with fluorescein  in the ER so Ophthalmology was consulted to rule out corneal ulcer.  Pt states minimal pain.  She does have a h/o contact lens wear but states took them out 5 weeks ago.  On exam pt was 20/20 in the right eye and 20/400 in the left.  PERRL.  Extraocular movements intact in both eyes.  Visual field full to confrontation in both eyes. IOP was 18 OD and 19 OS.  Bedside Exam L/L/L: Dermatochalasis OU C/S: mild injection left eye with mild discharge Cornea:  WNL OD, Contact lens in place in the left eye causing fluorescein  uptake A/C: D/Q OU I: R/R OU L: 2+ NS OU  Retina flat and intact in both eyes.  A/P Retained Contact lens in the left eye.  Contact removed and the cornea no longer stained.  No corneal ulcer present.  Pt vision retested and was 20/40 in the left eye.  Will start patient on Gatifloxacin  qid in the left eye secondary to contact lens overwear and the mild discharge.  I will recheck tomorrow to make sure discharge has improved and pt doing well.   Thank you for allowing me to participate in the care of this patient.  Please feel free to contact me if you have any concerns.  Evalene Raw, M.D. (Cell) [phone] (Office) [phone]

## 2024-06-22 NOTE — Plan of Care (Signed)
  Problem: Education: Goal: Knowledge of disease or condition will improve Outcome: Not Progressing Goal: Knowledge of secondary prevention will improve (MUST DOCUMENT ALL) Outcome: Not Progressing Goal: Knowledge of patient specific risk factors will improve (DELETE if not current risk factor) Outcome: Not Progressing   Problem: Ischemic Stroke/TIA Tissue Perfusion: Goal: Complications of ischemic stroke/TIA will be minimized Outcome: Not Progressing   Problem: Coping: Goal: Will verbalize positive feelings about self Outcome: Not Progressing Goal: Will identify appropriate support needs Outcome: Not Progressing   Problem: Health Behavior/Discharge Planning: Goal: Ability to manage health-related needs will improve Outcome: Not Progressing Goal: Goals will be collaboratively established with patient/family Outcome: Not Progressing   Problem: Self-Care: Goal: Ability to participate in self-care as condition permits will improve Outcome: Not Progressing Goal: Verbalization of feelings and concerns over difficulty with self-care will improve Outcome: Not Progressing Goal: Ability to communicate needs accurately will improve Outcome: Not Progressing   Problem: Nutrition: Goal: Risk of aspiration will decrease Outcome: Not Progressing Goal: Dietary intake will improve Outcome: Not Progressing

## 2024-06-22 NOTE — H&P (Signed)
 [facility] H&P   Patient Demographics:    Michele Tyler, is a 78 y.o. female  MRN: [id number]   DOB - 06-07-1946  Admit Date - 06/22/2024  Outpatient Primary MD for the patient is [name], MD     Patient coming from: [facility] ER  Chief Complaint  Patient presents with   Headache      HPI:    Michele Tyler  is a 78 y.o. female, with history of recent cerebellar stroke about a month ago, hypertension, dyslipidemia, anxiety, depression who recently had a thorough stroke workup including a TEE about a month ago which was unremarkable presents to the hospital with 2 to 3-day history of mild headache behind her left eye, left eye itching and yellowish left eye discharge, she denies any photophobia, denies any pain with eye movement, she presented to the ER for this complaint.  In the ER she had a stroke workup which showed positive acute cerebellar infarct on MRI, neurology was consulted and I was requested to admit the patient for  Patient interestingly currently does not have any new focal deficits, she does not have any subjective complaints except her left eye discomfort as above, she has been taking her prescribed medications from previous hospitalizations without any fail, she does wear contact lenses but has not worn any in the last 5 weeks, no fever or chills, currently pain-free, no diplopia or photophobia.  No focal weakness.  She will be admitted for possible left eye conjunctivitis versus keratitis and acute stroke   Review of systems:    A full 10 point Review of Systems was done, except as stated above, all other Review of Systems were negative.   With Past History of the following :     Past Medical History:  Diagnosis Date   Anxiety    Depression    Hypertension    Stroke (cerebrum) [facility])       Past Surgical History:  Procedure Laterality Date   DILATION AND CURETTAGE OF UTERUS  2002   FACELIFT  2005   TRANSESOPHAGEAL ECHOCARDIOGRAM (CATH LAB) N/A 06/16/2024   Procedure: TRANSESOPHAGEAL ECHOCARDIOGRAM;  Surgeon: Delford Maude BROCKS, MD;  Location: [facility] INVASIVE CV LAB;  Service: Cardiovascular;  Laterality: N/A;  Social History:     Social History   Tobacco Use   Smoking status: Former    Current packs/day: 0.00    Average packs/day: 1 pack/day for 10.0 years (10.0 ttl pk-yrs)    Types: Cigarettes    Start date: 07/29/1976    Quit date: 1988    Years since quitting: 37.9   Smokeless tobacco: Never  Substance Use Topics   Alcohol  use: Yes    Alcohol /week: 1.0 - 2.0 standard drink of alcohol     Types: 1 - 2 Standard drinks or equivalent per week    Comment: social       Family History :     Family History  Problem Relation Age of Onset   Hypertension Mother    Depression Father    Hypertension Father       Home Medications:   Prior to Admission medications   Medication Sig Start Date End Date Taking? Authorizing Provider  Ascorbic Acid (VITAMIN C CR) 1500 MG TBCR Take 1,500 mg by mouth daily.    [provider]  aspirin  EC 81 MG tablet Take 1 tablet (81 mg total) by mouth daily. Swallow whole. 05/20/24   Briana Elgin LABOR, MD  atorvastatin  (LIPITOR) 10 MG tablet TAKE 1 TABLET BY MOUTH AT  BEDTIME 05/19/24   [name], MD  B Complex-Folic Acid  (BALANCED B-150 PO) Take 1 capsule by mouth daily.    [provider]  busPIRone  (BUSPAR ) 7.5 MG tablet TAKE 1 TABLET BY MOUTH 3 TIMES  DAILY Patient taking differently: Take 7.5 mg by mouth at bedtime. 12/25/23   [name], MD  CALCIUM  MAGNESIUM ZINC PO Take 1 tablet by mouth in the morning.    [provider]  Cholecalciferol  (VITAMIN D3) 5000 units CAPS Take  5,000 Units by mouth daily with breakfast.    [provider]  Cyanocobalamin  (B-12) 1000 MCG CAPS Take 1,000 mcg by mouth in the morning.    [provider]  Ferrous Sulfate  (IRON) 325 (65 Fe) MG TABS Take 325 mg by mouth daily with breakfast.    [provider]  folic acid  (FOLVITE ) 800 MCG tablet Take 800 mcg by mouth daily.    [provider]  olmesartan  (BENICAR ) 40 MG tablet Take 1 tablet (40 mg total) by mouth daily. Take 1/2 tablet daily for the first two weeks 06/09/24   [name], MD  Omega-3 Fatty Acids (OMEGA-3 FISH OIL PO) Take 4,000 mg by mouth daily.    [provider]  ondansetron  (ZOFRAN ) 4 MG tablet Take 1 tablet (4 mg total) by mouth every 8 (eight) hours as needed for nausea or vomiting. 06/09/24   [name], MD  polyethylene glycol powder (GLYCOLAX /MIRALAX ) 17 GM/SCOOP powder Take 17 g by mouth daily. Dissolve 1 capful (17g) in 4-8 ounces of liquid and take by mouth daily.    [provider]  vitamin A 8000 UNIT capsule Take 8,000 Units by mouth daily.    [provider]  VITAMIN E-1000 PO Take 1,000 Units by mouth daily.    [provider]  Vitamins-Lipotropics (BALANCED B-150 COMPLEX TR PO) Take 1 tablet by mouth See admin instructions. Take 1 tablet by mouth once a day (contains b-1 @ 150 mg, b-2 150 mg, b-6 150 mg, b-12 @ 150 mg, and niacin 150 mg)    [provider]     Allergies:     Allergies  Allergen Reactions   Codeine Nausea Only  Physical Exam:   Vitals  Blood pressure (!) 176/95, pulse 90, temperature 98 F (36.7 C), resp. rate (!) 24, height 5' 4 (1.626 m), weight 50.8 kg, SpO2 100%.   1. General Elderly Caucasian female lying in hospital bed in no apparent discomfort  2. Normal affect and insight, Not Suicidal or Homicidal, Awake Alert,   3. No F.N deficits, ALL C.Nerves Intact, Strength 5/5 all 4 extremities, Sensation intact all 4 extremities,  Plantars down going.  Does have very mild dysdiadochokinesis  4.  Right eye unremarkable, left eye has conjunctival injection and redness, yellowish discharge from the left eye, intact ocular movement, no diplopia, no vision loss.  No eye pain or headache with ocular movement  5. Supple Neck, No JVD, No cervical lymphadenopathy appriciated, No Carotid Bruits.  6. Symmetrical Chest wall movement, Good air movement bilaterally, CTAB.  7. RRR, No Gallops, Rubs or Murmurs, No Parasternal Heave.  8. Positive Bowel Sounds, Abdomen Soft, No tenderness, No organomegaly appriciated,No rebound -guarding or rigidity.  9.  No Cyanosis, Normal Skin Turgor, No Skin Rash or Bruise.  10. Good muscle tone,  joints appear normal , no effusions, Normal ROM.  11. No Palpable Lymph Nodes in Neck or Axillae      Data Review:   Recent Labs  Lab 06/16/24 1103 06/22/24 1251  WBC  --  7.5  HGB 12.6 11.6*  HCT 37.0 35.4*  PLT  --  579*  MCV  --  84.3  MCH  --  27.6  MCHC  --  32.8  RDW  --  13.0    Recent Labs  Lab 06/16/24 1103 06/22/24 1251  NA 134* 130*  K 3.9 4.1  CL 94* 95*  CO2  --  27  ANIONGAP  --  8  GLUCOSE 89 100*  BUN 8 8  CREATININE 0.70 0.59  AST  --  21  ALT  --  14  ALKPHOS  --  81  BILITOT  --  0.5  ALBUMIN   --  3.1*  CALCIUM   --  8.9    Lab Results  Component Value Date   CHOL 124 05/13/2024   HDL 63 05/13/2024   LDLCALC 47 05/13/2024   TRIG 75 05/13/2024   CHOLHDL 2.0 05/13/2024    Recent Labs  Lab 06/22/24 1251  CALCIUM  8.9    Recent Labs  Lab 06/16/24 1103 06/22/24 1251  WBC  --  7.5  PLT  --  579*  CREATININE 0.70 0.59    Urinalysis    Component Value Date/Time   COLORURINE STRAW (A) 05/12/2024 1604   APPEARANCEUR HAZY (A) 05/12/2024 1604   LABSPEC 1.004 (L) 05/12/2024 1604   PHURINE 7.0 05/12/2024 1604   GLUCOSEU NEGATIVE 05/12/2024 1604   HGBUR NEGATIVE 05/12/2024 1604   BILIRUBINUR NEGATIVE 05/12/2024 1604   KETONESUR 5 (A)  05/12/2024 1604   PROTEINUR NEGATIVE 05/12/2024 1604   NITRITE NEGATIVE 05/12/2024 1604   LEUKOCYTESUR NEGATIVE 05/12/2024 1604      Imaging Results:    MR BRAIN WO CONTRAST Result Date: 06/22/2024 EXAM: MRI BRAIN WITHOUT CONTRAST 06/22/2024 03:56:29 PM TECHNIQUE: Multiplanar multisequence MRI of the head/brain was performed without the administration of intravenous contrast. COMPARISON: Same day CT head and MRI head 05/12/2024. CLINICAL HISTORY: Stroke, follow up. FINDINGS: BRAIN AND VENTRICLES: There is a focus of acute infarct within the posterior left cerebellum involving a portion of the left PICA territory. There are numerous additional scattered areas of infarct involving the cortex and subcortical  white matter within the bilateral frontal and parietal lobes. Additional areas of more ill-defined diffusion signal abnormality in the cortex primarily of the right middle frontal gyrus and in the left middle frontal gyrus without definite restricted diffusion which may reflect areas of subacute infarct. There are scattered and confluent areas of T2 and FLAIR hyperintensity in the periventricular and subcortical white matter with additional areas of signal abnormality in the pons compatible with extensive chronic microvascular ischemic changes. There are remote lacunar infarcts in the bilateral basal ganglia and thalami. Additional areas of remote cortical infarct in the lateral right temporal lobe and in the bilateral occipital lobes. Additional regions of remote infarct and prior petechial hemorrhage in the right frontal lobe. Redemonstrated now remote infarcts in the right cerebellum. No acute intracranial hemorrhage. No mass. No midline shift. No hydrocephalus. The sella is unremarkable. Normal flow voids. ORBITS: No acute abnormality. SINUSES AND MASTOIDS: No acute abnormality. BONES AND SOFT TISSUES: Normal marrow signal. No acute soft tissue abnormality. IMPRESSION: 1. Acute infarct in the  posterior left cerebellum involving a portion of the left PICA territory. 2. Numerous additional scattered acute cortical and subcortical infarcts in the bilateral frontal and parietal lobes which are new from prior. 3. Additional bilateral subacute cortical infarcts corresponding to areas of infarct on the prior MRI. 4. Extensive chronic microvascular ischemic changes and remote infarcts as above. Electronically signed by: Donnice Mania MD 06/22/2024 04:20 PM EST RP Workstation: [id number]   CT Head Wo Contrast Result Date: 06/22/2024 EXAM: CT HEAD WITHOUT CONTRAST 06/22/2024 12:33:00 PM TECHNIQUE: CT of the head was performed without the administration of intravenous contrast. Automated exposure control, iterative reconstruction, and/or weight based adjustment of the mA/kV was utilized to reduce the radiation dose to as low as reasonably achievable. COMPARISON: CT head 05/12/2024 CLINICAL HISTORY: Headache, new onset (Age >= 51y) FINDINGS: BRAIN AND VENTRICLES: No acute hemorrhage. Since October 15, new/interval acute or subacute left cerebellar infarct. Additional remote bilateral cerebellar infarcts. Remote left thalamic lacunar infarct. Remote right posterior temporal infarct. Patchy white matter hypodensities, compatible with chronic microvascular ischemic changes. No hydrocephalus. No extra-axial collection. No mass effect or midline shift. ORBITS: No acute abnormality. SINUSES: No acute abnormality. SOFT TISSUES AND SKULL: No skull fracture. IMPRESSION: 1. Since October 15, new/interval acute or subacute left cerebellar infarct. Recommend MRI head. 2. Additional remote infarcts and chronic microvascular ischemic change. Electronically signed by: Gilmore Molt MD 06/22/2024 12:40 PM EST RP Workstation: [id number]    My personal review of EKG: Rhythm NSR,   no Acute ST changes   Assessment & Plan:    1.  Left periorbital headache, left eye infection and discharge.  This was patient's presenting  complaint, likely has left eye conjunctivitis versus keratitis, no diplopia or vision changes, currently pain-free, eye movements are pain-free as well, do not think she has orbital cellulitis, at this point I have consulted ophthalmologist Dr. Lavonia who will see the patient shortly.  Defer management of this issue to him.  Of note patient wears contact lenses but has not worn any in the last 5 weeks, does not recall of any eye injury or foreign body in it.  2.  Recent history of cerebellar stroke, MRI now confirms Acute infarct in the posterior left cerebellum involving a portion of the left PICA territory along with numerous additional scattered acute cortical and subcortical infarcts in the bilateral frontal and parietal lobes which are new from prior and additional bilateral subacute cortical infarcts corresponding to areas of infarct  on the prior MRI.  She had thorough recent workup which included a TEE, was on statin along with aspirin  at home however patient is not sure whether she was taking aspirin  or Plavix , she has finished her DAPT treatment, A1c was stable few weeks ago.  I have consulted neurology who will see the patient shortly defer further workup to neurology as she had thorough workup not too long ago, continue telemetry monitoring, requested pharmacy to confirm her antiplatelet regimen for now aspirin  will be continued which is listed.  Continue home statin.  Check lipid panel in the morning, PT OT speech, further workup per neurology.  Interestingly patient has minimal to no new neurological symptoms only mild dysdiadochokinesis which she thinks is chronic.  3.  Dyslipidemia.  Home statin continue repeat lipid panel in the morning.  4.  Hypertension.  Allow for permissive hypertension due to #2 above.  5.  Chronic hyponatremia.  Check urine osmolality, urine sodium along with serum osmolality and uric acid  6.  Chronic iron deficiency anemia.  Stable.  7.  Mild cognitive decline.  At  risk for delirium.  Monitor.   DVT Prophylaxis Heparin     AM Labs Ordered, also please review Full Orders  Family Communication: Admission, patients condition and plan of care including tests being ordered have been discussed with the patient  who indicates understanding and agree with the plan and Code Status.  Code Status full code  Likely DC to to be decided  Condition fair  Consults called: Ophthalmology Dr. Lavonia, neurology  Admission status: Observation  Time spent in minutes : 40  Signature  -    Lavada Stank M.D on 06/22/2024 at 5:48 PM   -  To page go to [email or web address]

## 2024-06-22 NOTE — ED Triage Notes (Signed)
 Patient arrives via [facility] for headache from home. Patient with headache x 2 days behind left eye. Nausea and constipation x3 days. Hx of stroke in October, defecit in fine motor from previous stroke. 18 LAC. 4mg  zofran  and 300cc fluid. Hx of hyponatremia.   EMS vitals 156/80 HR 80 O2 94 RA CBG 143

## 2024-06-22 NOTE — Telephone Encounter (Signed)
 FYI Only or Action Required?: FYI only for provider: ED advised.  Patient was last seen in primary care on 06/09/2024 by [name], MD.  Called Nurse Triage reporting Headache.  Symptoms began yesterday.  Interventions attempted: OTC medications: tylenol .  Symptoms are: unchanged.  Triage Disposition: Go to ED Now (Notify PCP)  Patient/caregiver understands and will follow disposition?: Yes  Copied from CRM [id number]. Topic: Clinical - Red Word Triage >> Jun 22, 2024  9:50 AM Charlet HERO wrote: Red Word that prompted transfer to Nurse Triage: Pain is having severe pain in her left eye she is post stroke and has head ache. Dr Chandra Reason for Disposition  Severe pain in one eye  Answer Assessment - Initial Assessment Questions 1. LOCATION: Where does it hurt?      Left eye Eye pain 2. ONSET: When did the headache start? (e.g., minutes, hours, days)      Last night 3. PATTERN: Does the pain come and go, or has it been constant since it started?     Gone away and come back 4. SEVERITY: How bad is the pain? and What does it keep you from doing?  (e.g., Scale 1-10; mild, moderate, or severe)     8 5. RECURRENT SYMPTOM: Have you ever had headaches before? If Yes, ask: When was the last time? and What happened that time?      Yes had headaches like this with the eye pain before, complains of nausea 6. CAUSE: What do you think is causing the headache?     unsure 7. MIGRAINE: Have you been diagnosed with migraine headaches? If Yes, ask: Is this headache similar?      denies 8. HEAD INJURY: Has there been any recent injury to your head?      denies  Protocols used: Headache-A-AH

## 2024-06-22 NOTE — Consult Note (Signed)
 NEUROLOGY CONSULT NOTE   Date of service: June 22, 2024 Patient Name: Michele Tyler MRN:  [id number] DOB:  27-Aug-1945 Chief Complaint: Eye pain Requesting Provider: Dennise Lavada POUR, MD  History of Present Illness  Clela Hagadorn is a 78 y.o. female with history of HTN, HLD, recent cardio embolic stroke presenting with eye pain and headache.  MRI shows new strokes-new or from the prior comparison of 05/12/2024.  Admitted 10/16 through 10/23 and was found to have multifocal bilateral infarcts, embolic pattern, secondary to cardioembolic source.  She had a TEE done on 11/19 that showed EF 60% with no ASD or PFO and a negative bubble study.  They were able to see the area of nodular calcification on the right cusp that was also noted on her 2D echo.  TEE was recommended.  No left atrial or left atrial appendage thrombus, nodular calcification of right coronary cusp adjacent to the noncusp commissure-do not think papilloma.  Agitated saline study negative.  Lower extremity venous duplex was negative for DVT.  She did have a Zio patch however the results are not back yet.  She was discharged with DAPT for 3 weeks and then aspirin  81 mg alone. Returns for evaluation of headache and eye pain-eye pain deemed to be due to retained contact lens.  Due to recent history of stroke, MRI done again-today shows an acute infarct in the posterior left cerebellum and scattered acute cortical and subcortical infarcts in the bilateral frontal and parietal lobes which are new from her prior MRI.  Admitted for further workup and neurological evaluation  LKW: 2 days prior Modified rankin score: 1-No significant post stroke disability and can perform usual duties with stroke symptoms IV Thrombolysis: Outside of the window EVT: No LVO  NIHSS components Score: Comment  1a Level of Conscious 0[]  1[]  2[]  3[]      1b LOC Questions 0[]  1[]  2[]       1c LOC Commands 0[]  1[]  2[]       2 Best Gaze 0[]  1[]  2[]       3  Visual 0[]  1[]  2[]  3[]      4 Facial Palsy 0[]  1[]  2[]  3[]      5a Motor Arm - left 0[]  1[]  2[]  3[]  4[]  UN[]    5b Motor Arm - Right 0[]  1[]  2[]  3[]  4[]  UN[]    6a Motor Leg - Left 0[]  1[]  2[]  3[]  4[]  UN[]    6b Motor Leg - Right 0[]  1[]  2[]  3[]  4[]  UN[]    7 Limb Ataxia 0[]  1[]  2[x]  UN[]    Bilateral upper extremities  8 Sensory 0[]  1[]  2[]  UN[]      9 Best Language 0[]  1[]  2[]  3[]      10 Dysarthria 0[]  1[]  2[]  UN[]      11 Extinct. and Inattention 0[]  1[]  2[]       TOTAL:       ROS  Comprehensive ROS performed and pertinent positives documented in HPI   Past History   Past Medical History:  Diagnosis Date   Anxiety    Depression    Hypertension    Stroke (cerebrum) [facility])     Past Surgical History:  Procedure Laterality Date   DILATION AND CURETTAGE OF UTERUS  2002   FACELIFT  2005   TRANSESOPHAGEAL ECHOCARDIOGRAM (CATH LAB) N/A 06/16/2024   Procedure: TRANSESOPHAGEAL ECHOCARDIOGRAM;  Surgeon: Delford Maude BROCKS, MD;  Location: [facility] INVASIVE CV LAB;  Service: Cardiovascular;  Laterality: N/A;    Family History: Family History  Problem Relation Age of Onset   Hypertension  Mother    Depression Father    Hypertension Father     Social History  reports that she quit smoking about 37 years ago. Her smoking use included cigarettes. She started smoking about 47 years ago. She has a 10 pack-year smoking history. She has never used smokeless tobacco. She reports current alcohol  use of about 1.0 - 2.0 standard drink of alcohol  per week. She reports that she does not use drugs.  Allergies  Allergen Reactions   Codeine Nausea Only    Medications   Current Facility-Administered Medications:    [START ON 06/23/2024]  stroke: early stages of recovery book, , Does not apply, Once, Dennise, Prashant K, MD   0.9 %  sodium chloride  infusion, , Intravenous, Continuous, Singh, Prashant K, MD   acetaminophen  (TYLENOL ) tablet 650 mg, 650 mg, Oral, Q4H PRN **OR** acetaminophen  (TYLENOL ) 160 MG/5ML  solution 650 mg, 650 mg, Per Tube, Q4H PRN **OR** acetaminophen  (TYLENOL ) suppository 650 mg, 650 mg, Rectal, Q4H PRN, Singh, Prashant K, MD   aspirin  EC tablet 81 mg, 81 mg, Oral, Daily, Singh, Prashant K, MD   atorvastatin  (LIPITOR) tablet 10 mg, 10 mg, Oral, QHS, Singh, Prashant K, MD   [START ON 06/23/2024] cyanocobalamin  (VITAMIN B12) tablet 1,000 mcg, 1,000 mcg, Oral, q AM, Dennise, Prashant K, MD   erythromycin  ophthalmic ointment 1 Application, 1 Application, Left Eye, Once, Melvenia Motto, MD   fluorescein  ophthalmic strip 1 strip, 1 strip, Both Eyes, Once, Melvenia Motto, MD   folic acid  (FOLVITE ) tablet 1 mg, 1 mg, Oral, Daily, Singh, Prashant K, MD   heparin  injection 5,000 Units, 5,000 Units, Subcutaneous, Q8H, Dennise, Prashant K, MD   [START ON 06/23/2024] omega-3 acid ethyl esters (LOVAZA ) capsule 4 g, 4 capsule, Oral, Daily, Dennise, Prashant K, MD   [START ON 06/23/2024] polyethylene glycol (MIRALAX  / GLYCOLAX ) packet 17 g, 17 g, Oral, Daily, Singh, Prashant K, MD   senna-docusate (Senokot-S) tablet 1 tablet, 1 tablet, Oral, QHS PRN, Singh, Prashant K, MD   tetracaine  (PONTOCAINE) 0.5 % ophthalmic solution 1 drop, 1 drop, Both Eyes, Once, Melvenia Motto, MD  Current Outpatient Medications:    Ascorbic Acid (VITAMIN C CR) 1500 MG TBCR, Take 1,500 mg by mouth daily., Disp: , Rfl:    aspirin  EC 81 MG tablet, Take 1 tablet (81 mg total) by mouth daily. Swallow whole., Disp: , Rfl:    atorvastatin  (LIPITOR) 10 MG tablet, TAKE 1 TABLET BY MOUTH AT  BEDTIME, Disp: 100 tablet, Rfl: 2   B Complex-Folic Acid  (BALANCED B-150 PO), Take 1 capsule by mouth daily., Disp: , Rfl:    busPIRone  (BUSPAR ) 7.5 MG tablet, TAKE 1 TABLET BY MOUTH 3 TIMES  DAILY (Patient taking differently: Take 7.5 mg by mouth at bedtime.), Disp: 300 tablet, Rfl: 0   CALCIUM  MAGNESIUM ZINC PO, Take 1 tablet by mouth in the morning., Disp: , Rfl:    Cholecalciferol  (VITAMIN D3) 5000 units CAPS, Take 5,000 Units by mouth daily with  breakfast., Disp: , Rfl:    Cyanocobalamin  (B-12) 1000 MCG CAPS, Take 1,000 mcg by mouth in the morning., Disp: , Rfl:    Ferrous Sulfate  (IRON) 325 (65 Fe) MG TABS, Take 325 mg by mouth daily with breakfast., Disp: , Rfl:    folic acid  (FOLVITE ) 800 MCG tablet, Take 800 mcg by mouth daily., Disp: , Rfl:    olmesartan  (BENICAR ) 40 MG tablet, Take 1 tablet (40 mg total) by mouth daily. Take 1/2 tablet daily for the first two weeks, Disp:  90 tablet, Rfl: 1   Omega-3 Fatty Acids (OMEGA-3 FISH OIL PO), Take 4,000 mg by mouth daily., Disp: , Rfl:    ondansetron  (ZOFRAN ) 4 MG tablet, Take 1 tablet (4 mg total) by mouth every 8 (eight) hours as needed for nausea or vomiting., Disp: 20 tablet, Rfl: 1   polyethylene glycol powder (GLYCOLAX /MIRALAX ) 17 GM/SCOOP powder, Take 17 g by mouth daily. Dissolve 1 capful (17g) in 4-8 ounces of liquid and take by mouth daily., Disp: , Rfl:    vitamin A 8000 UNIT capsule, Take 8,000 Units by mouth daily., Disp: , Rfl:    VITAMIN E-1000 PO, Take 1,000 Units by mouth daily., Disp: , Rfl:    Vitamins-Lipotropics (BALANCED B-150 COMPLEX TR PO), Take 1 tablet by mouth See admin instructions. Take 1 tablet by mouth once a day (contains b-1 @ 150 mg, b-2 150 mg, b-6 150 mg, b-12 @ 150 mg, and niacin 150 mg), Disp: , Rfl:   Vitals   Vitals:   06/22/24 1152 06/22/24 1156 06/22/24 1625  BP:  (!) 160/88 (!) 176/95  Pulse:  86 90  Resp:  16 (!) 24  Temp:  97.7 F (36.5 C)   TempSrc:  Oral   SpO2:  98% 100%  Weight: 50.8 kg    Height: 5' 4 (1.626 m)      Body mass index is 19.22 kg/m.   Physical Exam   Constitutional: Appears well-developed and well-nourished.  Psych: Affect appropriate to situation.  Eyes: No scleral injection.  HENT: No OP obstruction.  Head: Normocephalic.  Cardiovascular: Normal rate and regular rhythm.  Respiratory: Effort normal, non-labored breathing.  GI: Soft.  No distension. There is no tenderness.  Skin: WDI.   Neurologic  Examination    Neuro: Mental Status: Patient is awake, alert, oriented to person, place, month, year, and situation. Patient is able to give a clear and coherent history. No signs of aphasia or neglect Cranial Nerves: II: Visual Fields are full. Pupils are equal, round, and reactive to light.   III,IV, VI: EOMI without ptosis or diploplia.  V: Facial sensation is symmetric to temperature VII: Facial movement is symmetric resting and smiling VIII: Hearing is intact to voice X: Palate elevates symmetrically XI: Shoulder shrug is symmetric. XII: Tongue protrudes midline without atrophy or fasciculations.  Motor: Tone is normal. Bulk is normal. 5/5 strength was present in all four extremities.  Sensory: Sensation is symmetric to light touch and temperature in the arms and legs. No extinction to DSS present.  Cerebellar: No ataxia in BLE FNF with ataxia    Labs/Imaging/Neurodiagnostic studies   CBC:  Recent Labs  Lab 2024-07-15 1103 06/22/24 1251  WBC  --  7.5  HGB 12.6 11.6*  HCT 37.0 35.4*  MCV  --  84.3  PLT  --  579*   Basic Metabolic Panel:  Lab Results  Component Value Date   NA 130 (L) 06/22/2024   K 4.1 06/22/2024   CO2 27 06/22/2024   GLUCOSE 100 (H) 06/22/2024   BUN 8 06/22/2024   CREATININE 0.59 06/22/2024   CALCIUM  8.9 06/22/2024   GFRNONAA >60 06/22/2024   GFRAA 106 07/05/2020   Lipid Panel:  Lab Results  Component Value Date   LDLCALC 47 05/13/2024   HgbA1c:  Lab Results  Component Value Date   HGBA1C 5.5 05/12/2024   CT Head without contrast(Personally reviewed): Since October 15, new/interval acute or subacute left cerebellar infarct. Recommend MRI head. Additional remote infarcts and chronic microvascular ischemic  change.  MRI Brain(Personally reviewed): 1. Acute infarct in the posterior left cerebellum involving a portion of the left PICA territory. 2. Numerous additional scattered acute cortical and subcortical infarcts in the bilateral  frontal and parietal lobes which are new from prior. 3. Additional bilateral subacute cortical infarcts corresponding to areas of infarct on the prior MRI. 4. Extensive chronic microvascular ischemic changes and remote infarcts as above.  ASSESSMENT   Anatasia Tino is a 78 y.o. female history of HTN, HLD, recent cardio embolic stroke presenting with eye pain and headache.  So far her workup has been unrevealing for the cause of her previous suspected embolic stroke.  She was discharged with DAPT for 3 weeks and then aspirin  81 mg alone.  MRI done today shows an acute infarct in the posterior left cerebellum and scattered acute cortical and subcortical infarcts in the bilateral frontal and parietal lobes which are new from her prior MRI. CT CAP to r/o malignancy.  Strong suspicion remains for an embolic source.  RECOMMENDATIONS  - Consider CT CAP rule out malignancy - DAPT with ASA and Plavix   - PT/OT/ST -No need to repeat cardiac workup - Stroke team to follow  Plan relayed to Dr. Dennise via staff message ______________________________________________________________________    Signed, Jorene Last, NP [facility]   Attending Neurohospitalist Addendum Patient seen and examined with APP/Resident. Agree with the history and physical as documented above. Agree with the plan as documented, which I helped formulate. I have independently reviewed the chart, obtained history, review of systems and examined the patient.I have personally reviewed pertinent head/neck/spine imaging (CT/MRI). Please feel free to call with any questions.  -- Eligio Lav, MD Neurologist [facility] Pager: [phone]

## 2024-06-22 NOTE — ED Notes (Signed)
 Patient transported to CT

## 2024-06-23 ENCOUNTER — Ambulatory Visit: Payer: Self-pay | Admitting: Family Medicine

## 2024-06-23 ENCOUNTER — Observation Stay (HOSPITAL_COMMUNITY)

## 2024-06-23 DIAGNOSIS — I6340 Cerebral infarction due to embolism of unspecified cerebral artery: Secondary | ICD-10-CM | POA: Diagnosis not present

## 2024-06-23 DIAGNOSIS — R188 Other ascites: Secondary | ICD-10-CM

## 2024-06-23 DIAGNOSIS — J90 Pleural effusion, not elsewhere classified: Secondary | ICD-10-CM | POA: Diagnosis not present

## 2024-06-23 DIAGNOSIS — I10 Essential (primary) hypertension: Secondary | ICD-10-CM | POA: Diagnosis not present

## 2024-06-23 DIAGNOSIS — I639 Cerebral infarction, unspecified: Secondary | ICD-10-CM | POA: Diagnosis not present

## 2024-06-23 DIAGNOSIS — E785 Hyperlipidemia, unspecified: Secondary | ICD-10-CM

## 2024-06-23 DIAGNOSIS — R29702 NIHSS score 2: Secondary | ICD-10-CM

## 2024-06-23 HISTORY — PX: IR PARACENTESIS: IMG2679

## 2024-06-23 LAB — BASIC METABOLIC PANEL WITH GFR
Anion gap: 9 (ref 5–15)
BUN: 8 mg/dL (ref 8–23)
CO2: 26 mmol/L (ref 22–32)
Calcium: 8.4 mg/dL — ABNORMAL LOW (ref 8.9–10.3)
Chloride: 97 mmol/L — ABNORMAL LOW (ref 98–111)
Creatinine, Ser: 0.63 mg/dL (ref 0.44–1.00)
GFR, Estimated: >60 mL/min (ref >60)
Glucose, Bld: 102 mg/dL — ABNORMAL HIGH (ref 70–99)
Potassium: 3.7 mmol/L (ref 3.5–5.1)
Sodium: 132 mmol/L — ABNORMAL LOW (ref 135–145)

## 2024-06-23 LAB — CBC WITH DIFFERENTIAL/PLATELET
Abs Immature Granulocytes: 0.03 K/uL (ref 0.00–0.07)
Basophils Absolute: 0 K/uL (ref 0.0–0.1)
Basophils Relative: 0 %
Eosinophils Absolute: 0.2 K/uL (ref 0.0–0.5)
Eosinophils Relative: 2 %
HCT: 31.6 % — ABNORMAL LOW (ref 36.0–46.0)
Hemoglobin: 10.4 g/dL — ABNORMAL LOW (ref 12.0–15.0)
Immature Granulocytes: 0 %
Lymphocytes Relative: 6 %
Lymphs Abs: 0.5 K/uL — ABNORMAL LOW (ref 0.7–4.0)
MCH: 27.4 pg (ref 26.0–34.0)
MCHC: 32.9 g/dL (ref 30.0–36.0)
MCV: 83.2 fL (ref 80.0–100.0)
Monocytes Absolute: 0.7 K/uL (ref 0.1–1.0)
Monocytes Relative: 9 %
Neutro Abs: 6.4 K/uL (ref 1.7–7.7)
Neutrophils Relative %: 83 %
Platelets: 538 K/uL — ABNORMAL HIGH (ref 150–400)
RBC: 3.8 MIL/uL — ABNORMAL LOW (ref 3.87–5.11)
RDW: 13 % (ref 11.5–15.5)
WBC: 7.8 K/uL (ref 4.0–10.5)
nRBC: 0 % (ref 0.0–0.2)

## 2024-06-23 LAB — LIPID PANEL
Cholesterol: 128 mg/dL (ref 0–200)
HDL: 59 mg/dL (ref >40)
LDL Cholesterol: 58 mg/dL (ref 0–99)
Total CHOL/HDL Ratio: 2.2 ratio
Triglycerides: 55 mg/dL (ref <150)
VLDL: 11 mg/dL (ref 0–40)

## 2024-06-23 LAB — BODY FLUID CELL COUNT WITH DIFFERENTIAL
Eos, Fluid: 0 %
Lymphs, Fluid: 90 %
Monocyte-Macrophage-Serous Fluid: 5 % — ABNORMAL LOW (ref 50–90)
Neutrophil Count, Fluid: 4 % (ref 0–25)
Other Cells, Fluid: 1 %
Total Nucleated Cell Count, Fluid: 1380 uL — ABNORMAL HIGH (ref 0–1000)

## 2024-06-23 LAB — ALBUMIN, PLEURAL OR PERITONEAL FLUID: Albumin, Fluid: 2.4 g/dL

## 2024-06-23 LAB — MAGNESIUM: Magnesium: 1.9 mg/dL (ref 1.7–2.4)

## 2024-06-23 MED ORDER — CLOPIDOGREL BISULFATE 75 MG PO TABS
75.0000 mg | ORAL_TABLET | Freq: Every day | ORAL | Status: DC
  Administered 2024-06-23 – 2024-06-24 (×2): 75 mg via ORAL
  Filled 2024-06-23 (×2): qty 1

## 2024-06-23 MED ORDER — LIDOCAINE-EPINEPHRINE 1 %-1:100000 IJ SOLN
INTRAMUSCULAR | Status: AC
  Filled 2024-06-23: qty 1

## 2024-06-23 MED ORDER — ONDANSETRON 4 MG PO TBDP
4.0000 mg | ORAL_TABLET | Freq: Three times a day (TID) | ORAL | Status: DC | PRN
  Administered 2024-06-23 – 2024-06-24 (×2): 4 mg via ORAL
  Filled 2024-06-23 (×2): qty 1

## 2024-06-23 MED ORDER — LIDOCAINE-EPINEPHRINE 1 %-1:100000 IJ SOLN
20.0000 mL | Freq: Once | INTRAMUSCULAR | Status: AC
  Administered 2024-06-23: 10 mL via INTRADERMAL
  Filled 2024-06-23: qty 20

## 2024-06-23 MED ORDER — ALBUMIN HUMAN 25 % IV SOLN: 25.0000 g | Freq: Once | INTRAVENOUS | Status: DC

## 2024-06-23 NOTE — Progress Notes (Addendum)
 STROKE TEAM PROGRESS NOTE    SIGNIFICANT HOSPITAL EVENTS  11/25: presented w/ eye pain   MRI brain: acute infarct in the posterior left cerebellum and scattered acute cortical and subcortical infarcts in the bilateral frontal and parietal lobes which are new from her prior MRI   INTERIM HISTORY/SUBJECTIVE  Sister at bed, thorough discussion and all questions answered. Patient worked with PT earlier, recommending home health.  She does not wish to go back to a facility and is happy that they are recommending home health.  OBJECTIVE  CBC    Component Value Date/Time   WBC 7.8 06/23/2024 0128   RBC 3.80 (L) 06/23/2024 0128   HGB 10.4 (L) 06/23/2024 0128   HGB 12.5 05/10/2024 1432   HCT 31.6 (L) 06/23/2024 0128   HCT 38.2 05/10/2024 1432   PLT 538 (H) 06/23/2024 0128   PLT 497 (H) 05/10/2024 1432   MCV 83.2 06/23/2024 0128   MCV 89 05/10/2024 1432   MCH 27.4 06/23/2024 0128   MCHC 32.9 06/23/2024 0128   RDW 13.0 06/23/2024 0128   RDW 13.1 05/10/2024 1432   LYMPHSABS 0.5 (L) 06/23/2024 0128   LYMPHSABS 0.4 (L) 05/10/2024 1432   MONOABS 0.7 06/23/2024 0128   EOSABS 0.2 06/23/2024 0128   EOSABS 0.0 05/10/2024 1432   BASOSABS 0.0 06/23/2024 0128   BASOSABS 0.0 05/10/2024 1432    BMET    Component Value Date/Time   NA 132 (L) 06/23/2024 0128   NA 125 (L) 06/09/2024 1144   K 3.7 06/23/2024 0128   CL 97 (L) 06/23/2024 0128   CO2 26 06/23/2024 0128   GLUCOSE 102 (H) 06/23/2024 0128   BUN 8 06/23/2024 0128   BUN 12 06/09/2024 1144   CREATININE 0.63 06/23/2024 0128   CALCIUM  8.4 (L) 06/23/2024 0128   EGFR 96 06/09/2024 1144   GFRNONAA >60 06/23/2024 0128    IMAGING past 24 hours CT CHEST ABDOMEN PELVIS W CONTRAST Result Date: 06/22/2024 EXAM: CT CHEST, ABDOMEN AND PELVIS WITH CONTRAST 06/22/2024 08:22:00 PM TECHNIQUE: CT of the chest, abdomen and pelvis was performed with the administration of intravenous contrast (50 mL iohexol  (OMNIPAQUE ) 350 MG/ML injection).  Multiplanar reformatted images are provided for review. Automated exposure control, iterative reconstruction, and/or weight based adjustment of the mA/kV was utilized to reduce the radiation dose to as low as reasonably achievable. COMPARISON: None available. CLINICAL HISTORY: r/o malignancy. FINDINGS: CHEST: MEDIASTINUM AND LYMPH NODES: Heart and pericardium are unremarkable. The central airways are clear. No mediastinal, hilar or axillary lymphadenopathy. Scattered coronary artery and aortic atherosclerosis. LUNGS AND PLEURA: Bilateral pleural effusions. Compressive atelectasis in the lower lobes bilaterally. No focal consolidation or pulmonary edema. No pneumothorax. ABDOMEN AND PELVIS: LIVER: Scattered cystic areas in the liver, the largest in the posterior right hepatic lobe measuring 1.7 cm. These appear benign. GALLBLADDER AND BILE DUCTS: Gallbladder is unremarkable. No biliary ductal dilatation. SPLEEN: No acute abnormality. PANCREAS: No acute abnormality. ADRENAL GLANDS: No suspicious adrenal abnormality. KIDNEYS, URETERS AND BLADDER: Mild left hydronephrosis. The left ureter is difficult to follow, but no obstructing stones seen. 1.3 cm simple appearing cyst in the mid pole of the left kidney. No suspicious renal abnormality. No stones in the right kidney. No perinephric or periureteral stranding. Urinary bladder is unremarkable. GI AND BOWEL: Stomach demonstrates no acute abnormality. There is no bowel obstruction. REPRODUCTIVE ORGANS: Large calcification in the central pelvis presumably reflects calcified fibroid. PERITONEUM AND RETROPERITONEUM: Large volume ascites throughout the abdomen and pelvis. No free air. VASCULATURE: Aorta  is normal in caliber. Aortic atherosclerosis. ABDOMINAL AND PELVIS LYMPH NODES: No lymphadenopathy. BONES AND SOFT TISSUES: No acute osseous abnormality. No focal soft tissue abnormality. IMPRESSION: 1. Large volume ascites throughout the abdomen and pelvis. 2. Bilateral  pleural effusions with compressive lower lobe atelectasis. 3. Mild left hydronephrosis without visible obstructing calculus. Electronically signed by: Franky Crease MD 06/22/2024 08:55 PM EST RP Workstation: [id number]   MR BRAIN WO CONTRAST Result Date: 06/22/2024 EXAM: MRI BRAIN WITHOUT CONTRAST 06/22/2024 03:56:29 PM TECHNIQUE: Multiplanar multisequence MRI of the head/brain was performed without the administration of intravenous contrast. COMPARISON: Same day CT head and MRI head 05/12/2024. CLINICAL HISTORY: Stroke, follow up. FINDINGS: BRAIN AND VENTRICLES: There is a focus of acute infarct within the posterior left cerebellum involving a portion of the left PICA territory. There are numerous additional scattered areas of infarct involving the cortex and subcortical white matter within the bilateral frontal and parietal lobes. Additional areas of more ill-defined diffusion signal abnormality in the cortex primarily of the right middle frontal gyrus and in the left middle frontal gyrus without definite restricted diffusion which may reflect areas of subacute infarct. There are scattered and confluent areas of T2 and FLAIR hyperintensity in the periventricular and subcortical white matter with additional areas of signal abnormality in the pons compatible with extensive chronic microvascular ischemic changes. There are remote lacunar infarcts in the bilateral basal ganglia and thalami. Additional areas of remote cortical infarct in the lateral right temporal lobe and in the bilateral occipital lobes. Additional regions of remote infarct and prior petechial hemorrhage in the right frontal lobe. Redemonstrated now remote infarcts in the right cerebellum. No acute intracranial hemorrhage. No mass. No midline shift. No hydrocephalus. The sella is unremarkable. Normal flow voids. ORBITS: No acute abnormality. SINUSES AND MASTOIDS: No acute abnormality. BONES AND SOFT TISSUES: Normal marrow signal. No acute soft  tissue abnormality. IMPRESSION: 1. Acute infarct in the posterior left cerebellum involving a portion of the left PICA territory. 2. Numerous additional scattered acute cortical and subcortical infarcts in the bilateral frontal and parietal lobes which are new from prior. 3. Additional bilateral subacute cortical infarcts corresponding to areas of infarct on the prior MRI. 4. Extensive chronic microvascular ischemic changes and remote infarcts as above. Electronically signed by: Donnice Mania MD 06/22/2024 04:20 PM EST RP Workstation: [id number]   CT Head Wo Contrast Result Date: 06/22/2024 EXAM: CT HEAD WITHOUT CONTRAST 06/22/2024 12:33:00 PM TECHNIQUE: CT of the head was performed without the administration of intravenous contrast. Automated exposure control, iterative reconstruction, and/or weight based adjustment of the mA/kV was utilized to reduce the radiation dose to as low as reasonably achievable. COMPARISON: CT head 05/12/2024 CLINICAL HISTORY: Headache, new onset (Age >= 51y) FINDINGS: BRAIN AND VENTRICLES: No acute hemorrhage. Since October 15, new/interval acute or subacute left cerebellar infarct. Additional remote bilateral cerebellar infarcts. Remote left thalamic lacunar infarct. Remote right posterior temporal infarct. Patchy white matter hypodensities, compatible with chronic microvascular ischemic changes. No hydrocephalus. No extra-axial collection. No mass effect or midline shift. ORBITS: No acute abnormality. SINUSES: No acute abnormality. SOFT TISSUES AND SKULL: No skull fracture. IMPRESSION: 1. Since October 15, new/interval acute or subacute left cerebellar infarct. Recommend MRI head. 2. Additional remote infarcts and chronic microvascular ischemic change. Electronically signed by: Gilmore Molt MD 06/22/2024 12:40 PM EST RP Workstation: [id number]    Vitals:   06/22/24 2106 06/22/24 2337 06/23/24 0322 06/23/24 0822  BP: (!) 155/95 (!) 158/91 (!) 152/79 (!) 154/85  Pulse: 98 93  92 86  Resp: 17 16 19 17   Temp: 98.6 F (37 C) 97.9 F (36.6 C) 97.6 F (36.4 C) 98.3 F (36.8 C)  TempSrc: Oral Axillary Axillary Oral  SpO2: 96% 98% 93% 97%  Weight:      Height:       PHYSICAL EXAM General:  Alert, well-nourished, well-developed patient in no acute distress Psych:  Mood and affect appropriate for situation CV: Regular rate and rhythm on monitor Respiratory:  Regular, unlabored respirations on room air GI: Abdomen soft and nontender   NEURO:  Mental Status: AA&Ox3, patient is able to give clear and coherent history Speech/Language: speech is without dysarthria or aphasia.  Naming, repetition, fluency, and comprehension intact.  Cranial Nerves:  II: PERRL. Visual fields full.  III, IV, VI: EOMI. Eyelids elevate symmetrically.  V: Sensation is intact to light touch and symmetrical to face.  VII: Face is symmetrical resting and smiling VIII: hearing intact to voice. IX, X: Palate elevates symmetrically. Phonation is normal.  KP:Dynloizm shrug 5/5. XII: tongue is midline without fasciculations. Motor: 5/5 strength to all muscle groups tested.  Tone: is normal and bulk is normal Sensation- Intact to light touch bilaterally. Extinction absent to light touch to DSS.   Coordination: FTN intact bilaterally with bilateral ataxia. Gait- deferred  Most Recent NIH: 2   ASSESSMENT/PLAN  [name] is a 78 y.o. female with history of HTN, HLD, recent cardio embolic stroke presenting with eye pain and headache. So far her workup has been unrevealing for the cause of her previous suspected embolic stroke.  She was discharged 05/20/24 with DAPT for 3 weeks and then aspirin  81 mg alone. Strong suspicion remains for an embolic source, admitted for continued workup.  NIH on Admission 2.  Recurrent embolic strokes: bilateral embolic shower, etiology: concerning for hypercoagulable state  CT head Since October 15, new/interval acute or subacute left cerebellar  infarct. Additional remote infarcts and chronic microvascular ischemic change. MRI  Acute infarct in the posterior left cerebellum involving a portion of the left PICA territory. Numerous additional scattered acute cortical and subcortical infarcts in the bilateral frontal and parietal lobes which are new from prior. Additional bilateral subacute cortical infarcts corresponding to areas of infarct on the prior MRI. 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 No PFO LDL 58 HgbA1c 5.5 in 04/2024 VTE prophylaxis - Heoarin SQ aspirin  81 mg daily (just finished DAPT for 3 weeks) prior to admission, now on aspirin  81 mg daily and clopidogrel  75 mg daily  Therapy recommendations:  HHPT Disposition:  pending  Ascites and pleural effusion CT CAP:  Large volume ascites throughout the abdomen and pelvis. Bilateral pleural effusions with compressive lower lobe atelectasis. S/p paracentesis with 1.5L ascites removal Pending pathology Repeat LE venous doppler pending Needs to rule out advanced malignancy  Hx of Stroke/TIA Admitted 05/13/24 and was found to have multifocal bilateral infarcts, embolic pattern, thought to be secondary to cardioembolic source.  CT head and neck unremarkable.  LE venous Doppler negative for DVT.  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  Zio patch placed.  LDL 47, A1c 5.5.  Discharged on DAPT and Lipitor 10. 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 Zio patch preliminary report no AF  Left eye  pain Ophthalmology consult: retained contact in left eye.  Contacts removed by ophthalmology with cornea no longer stained and no corneal ulcer present. Patient was started on gatifloxacin  4 times daily and left eye  secondary to contact lens over wear and mild discharge.   Ophthalmology will follow Pt symptoms resolved  Hypertension Home meds:  none Stable Long term BP goal normalize.   Hyperlipidemia Home meds:  Lipitor 10mg , continued LDL 58, goal < 70 High intensity statin not indicated due to LDL within goal on current treatment Continue statin at discharge  Other Stroke Risk Factors Advanced age Former smoker   Other Active Problems Hyponatremia - likely chronic, Na 132   Hospital day # 0   Pt seen by Neuro NP/APP and later by MD. Note/plan to be edited by MD as needed.    Rocky JAYSON Likes, DNP [facility] Please use AMION for contact information & EPIC for messaging.  ATTENDING NOTE: I reviewed above note and agree with the assessment and plan. Pt was seen and examined.   No family at bedside.  Patient lying bed, just had paracentesis with 1.5 L fluid removed.  Patient denies any abdominal pain but did have nauseous recently but no vomiting.  Patient neurologically intact, except residual right upper extremity ataxia on finger-to-nose.  No left-sided ataxia.  Etiology for patient recurrent embolic stroke is not quite clear, but concerning for hypercoag state from occult malignancy.  Pending pathology report from ascites.  Continue DAPT and low-dose statin for now.  Will follow  For detailed assessment and plan, please refer to above as I have made changes wherever appropriate.   Ary Cummins, MD PhD Stroke Neurology 06/23/2024 3:54 PM     To contact Stroke Continuity provider, please refer to [email or web address]. After hours, contact General Neurology

## 2024-06-23 NOTE — Hospital Course (Signed)
 78 y.o. female, with history of recent cerebellar stroke about a month ago, hypertension, dyslipidemia, anxiety, depression who recently had a thorough stroke workup including a TEE about a month ago which was unremarkable presents to the hospital with 2 to 3-day history of mild headache behind her left eye, left eye itching and yellowish left eye discharge, she denies any photophobia, denies any pain with eye movement, she presented to the ER for this complaint.  In the ER she had a stroke workup which showed positive acute cerebellar infarct on MRI, neurology was consulted. Hospitalist consulted for consideration for admission

## 2024-06-23 NOTE — Progress Notes (Signed)
  Progress Note   Patient: Michele Tyler FMW:[id number] DOB: 09/28/1945 DOA: 06/22/2024     0 DOS: the patient was seen and examined on 06/23/2024   Brief hospital course: 78 y.o. female, with history of recent cerebellar stroke about a month ago, hypertension, dyslipidemia, anxiety, depression who recently had a thorough stroke workup including a TEE about a month ago which was unremarkable presents to the hospital with 2 to 3-day history of mild headache behind her left eye, left eye itching and yellowish left eye discharge, she denies any photophobia, denies any pain with eye movement, she presented to the ER for this complaint.  In the ER she had a stroke workup which showed positive acute cerebellar infarct on MRI, neurology was consulted. Hospitalist consulted for consideration for admission  Assessment and Plan:  1.  Left periorbital headache, left eye infection and discharge.   -Ophthalmology consulted -Pt found to have retained contact lens in the L eye -Pt started o nGatifloxacin QID in L eye   2.  Recent history of cerebellar stroke, MRI now confirms Acute infarct in the posterior left cerebellum involving a portion of the left PICA territory along with numerous additional scattered acute cortical and subcortical infarcts in the bilateral frontal and parietal lobes  -Neurology following -Now on ASA 81mg  and plavix  75mg   -therapy recs for [facility] therapy noted   3.  Dyslipidemia.   -cont lipitor 10mg    4.  Hypertension. -bp stable at this time   5.  Chronic hyponatremia.   -Urine Na 85 with serum osm of 276 -Na improved   6.  Chronic iron deficiency anemia.  Stable.   7.  Mild cognitive decline.  At risk for delirium.  Monitor.  8. Large ascites -unclear etiology -requested US  diagnostic and therapeutic paracentesis -f/u on fluid analysis, cultures, and cytology      Subjective: Without complaints this AM. Working with SLP for cognition  Physical Exam: Vitals:    06/23/24 0322 06/23/24 0822 06/23/24 1116 06/23/24 1541  BP: (!) 152/79 (!) 154/85 (!) 145/74 139/69  Pulse: 92 86 98 (!) 103  Resp: 19 17 16 18   Temp: 97.6 F (36.4 C) 98.3 F (36.8 C) 98.4 F (36.9 C) 98.1 F (36.7 C)  TempSrc: Axillary Oral Oral Oral  SpO2: 93% 97% 96% 98%  Weight:      Height:       General exam: Awake, laying in bed, in nad Respiratory system: Normal respiratory effort, no wheezing Cardiovascular system: regular rate, s1, s2 Gastrointestinal system: distended, positive BS Central nervous system: CN2-12 grossly intact, strength intact Extremities: Perfused, no clubbing Skin: Normal skin turgor, no notable skin lesions seen Psychiatry: Mood normal // affect seems normal  Data Reviewed:  Labs reviewed: Na 132, K 3.7, Cr 0.63, WBC 7.8, Hgb 10.4, Plts 538  Family Communication: Pt in room, family not at bedside  Disposition: Status is: Observation The patient remains OBS appropriate and will d/c before 2 midnights.  Planned Discharge Destination: Home with Home Health    Author: Garnette Pelt, MD 06/23/2024 4:24 PM  For on call review [email or web address].

## 2024-06-23 NOTE — Evaluation (Addendum)
 Physical Therapy Evaluation Patient Details Name: Michele Tyler MRN: [id number] DOB: 1945/12/09 Today's Date: 06/23/2024  History of Present Illness  Pt is a 78 y/o F who presented to [facility] for headache and eye pain (questionably due to retained contact lens). 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 She was discharged home on Zio patch but results are pending. MRI brain this admission revealed new infarcts in L cerebellum and scattered infarcts in bilateral frontal and parietal lobes. PMHx: HTN, HLD.   Clinical Impression  Pt recently d/c home from SNF and has been home for a few weeks. Pt has a caregiver Monday-Sunday from 10 am- the evening after pt has had dinner and a bath. Pt was ModI with RW and had assist for bathing, cooking, and cleaning. Pt required MinA to stand with no AD with assist needed to correct posterior lean. Able to ambulate in the room with CGA and RW with pt declining further distance due to feeling nauseous. Pt reported having impaired cognition with difficulty carrying out tasks. Pt will have intermittent assist available upon d/c home. Recommending return home with continuation of HHPT. Acute PT to follow.       If plan is discharge home, recommend the following: A little help with walking and/or transfers;A little help with bathing/dressing/bathroom;Assist for transportation;Help with stairs or ramp for entrance   Can travel by private vehicle   Yes    Equipment Recommendations None recommended by PT     Functional Status Assessment Patient has had a recent decline in their functional status and demonstrates the ability to make significant improvements in function in a reasonable and predictable amount of time.     Precautions / Restrictions Precautions Precautions: Fall Recall of Precautions/Restrictions: Intact Restrictions Weight Bearing Restrictions Per Provider Order: No      Mobility  Bed Mobility Overal  bed mobility: Needs Assistance Bed Mobility: Supine to Sit, Sit to Supine    Supine to sit: Supervision Sit to supine: Supervision      Transfers Overall transfer level: Needs assistance Equipment used: None, Rolling walker (2 wheels) Transfers: Sit to/from Stand Sit to Stand: Min assist, Contact guard assist    General transfer comment: MinA with no AD due to small posterior LOB. CGA with RW with cues for hand placement as pt tends to push with B UE on RW handles    Ambulation/Gait Ambulation/Gait assistance: Contact guard assist Gait Distance (Feet): 30 Feet Assistive device: Rolling walker (2 wheels) Gait Pattern/deviations: Step-through pattern, Decreased stride length Gait velocity: decr     General Gait Details: steady gait with use of RW with CGA for safety. Increased time when turning  Modified Rankin (Stroke Patients Only) Modified Rankin (Stroke Patients Only) Pre-Morbid Rankin Score: Moderately severe disability Modified Rankin: Moderately severe disability     Balance Overall balance assessment: Needs assistance Sitting-balance support: No upper extremity supported, Feet supported Sitting balance-Leahy Scale: Fair Sitting balance - Comments: slight posterior lean with LE MMT Postural control: Posterior lean Standing balance support: Bilateral upper extremity supported, During functional activity, Reliant on assistive device for balance Standing balance-Leahy Scale: Poor Standing balance comment: posterior lean upon initial stand with no UE support       Pertinent Vitals/Pain Pain Assessment Pain Assessment: Faces Faces Pain Scale: Hurts little more Pain Location: h/a Pain Descriptors / Indicators: Headache Pain Intervention(s): Limited activity within patient's tolerance, Monitored during session, Repositioned    Home Living Family/patient expects to be discharged to:: Private residence Living  Arrangements: Alone Available Help at Discharge: Personal  care attendant (M-Sun. 10 am to after pt has dinner and a bath (maybe 6-7 pm)) Type of Home: House Home Access: Stairs to enter Entrance Stairs-Rails: Left Entrance Stairs-Number of Steps: 1   Home Layout: Two level;1/2 bath on main level;Able to live on main level with bedroom/bathroom Home Equipment: Grab bars - tub/shower;Rolling Walker (2 wheels);Cane - single point;Rollator (4 wheels)      Prior Function Prior Level of Function : Needs assist    Mobility Comments: Was ind with no AD prior to admit last month. Ind with RW after admit. Has been having HHPT ADLs Comments: Prior to admit last month, pt was Ind with ADLs. Now has a caregiver to help with laundry and cooking. Assist with getting into bathroom     Extremity/Trunk Assessment   Upper Extremity Assessment Upper Extremity Assessment: Defer to OT evaluation    Lower Extremity Assessment Lower Extremity Assessment: RLE deficits/detail;LLE deficits/detail RLE Deficits / Details: Hip flexion 4+/5, Knee ext 5/5, Ankle DF 4+/5 RLE Sensation: WNL RLE Coordination: WNL LLE Deficits / Details: Hip flexion 4+/5, Knee ext 5/5, Ankle DF 4+/5. Edema on dorsum of L foot LLE Sensation: WNL LLE Coordination: WNL    Cervical / Trunk Assessment Cervical / Trunk Assessment: Normal  Communication   Communication Communication: No apparent difficulties    Cognition Arousal: Alert Behavior During Therapy: WFL for tasks assessed/performed   PT - Cognitive impairments: No family/caregiver present to determine baseline, Safety/Judgement, Problem solving, Memory, Attention, Awareness      PT - Cognition Comments: A&Ox4, reported having difficulty with thinking. I know what I want to do, but my fingers won't do it Following commands: Impaired Following commands impaired: Only follows one step commands consistently, Follows multi-step commands inconsistently     Cueing Cueing Techniques: Verbal cues, Tactile cues      PT  Assessment Patient needs continued PT services  PT Problem List Decreased strength;Decreased activity tolerance;Decreased balance;Decreased mobility;Decreased knowledge of use of DME;Decreased cognition;Decreased safety awareness       PT Treatment Interventions DME instruction;Gait training;Stair training;Functional mobility training;Therapeutic activities;Therapeutic exercise;Balance training;Neuromuscular re-education;Patient/family education    PT Goals (Current goals can be found in the Care Plan section)  Acute Rehab PT Goals Patient Stated Goal: to feel better PT Goal Formulation: With patient Time For Goal Achievement: 07/07/24 Potential to Achieve Goals: Good    Frequency Min 2X/week        AM-PAC PT 6 Clicks Mobility  Outcome Measure Help needed turning from your back to your side while in a flat bed without using bedrails?: A Little Help needed moving from lying on your back to sitting on the side of a flat bed without using bedrails?: A Little Help needed moving to and from a bed to a chair (including a wheelchair)?: A Little Help needed standing up from a chair using your arms (e.g., wheelchair or bedside chair)?: A Little Help needed to walk in hospital room?: A Little Help needed climbing 3-5 steps with a railing? : A Little 6 Click Score: 18    End of Session   Activity Tolerance: Patient tolerated treatment well Patient left: in bed;with call bell/phone within reach;with bed alarm set Nurse Communication: Mobility status PT Visit Diagnosis: Unsteadiness on feet (R26.81);Other abnormalities of gait and mobility (R26.89);Muscle weakness (generalized) (M62.81)    Time: 9099-9067 PT Time Calculation (min) (ACUTE ONLY): 32 min   Charges:   PT Evaluation $PT Eval Low Complexity: 1 Low  PT Treatments $Therapeutic Activity: 8-22 mins PT General Charges $$ ACUTE PT VISIT: 1 Visit       Kate ORN, PT, DPT Secure Chat Preferred  Rehab Office  [phone]   Kate BRAVO [name] 06/23/2024, 9:54 AM

## 2024-06-23 NOTE — Plan of Care (Signed)
  Problem: Education: Goal: Knowledge of disease or condition will improve 06/23/2024 0505 by Baldwin Doran BIRCH, RN Outcome: Not Progressing 06/22/2024 2053 by Baldwin Doran BIRCH, RN Outcome: Not Progressing Goal: Knowledge of secondary prevention will improve (MUST DOCUMENT ALL) 06/23/2024 0505 by Baldwin Doran BIRCH, RN Outcome: Not Progressing 06/22/2024 2053 by Baldwin Doran BIRCH, RN Outcome: Not Progressing Goal: Knowledge of patient specific risk factors will improve (DELETE if not current risk factor) 06/23/2024 0505 by Baldwin Doran BIRCH, RN Outcome: Not Progressing 06/22/2024 2053 by Baldwin Doran D, RN Outcome: Not Progressing   Problem: Ischemic Stroke/TIA Tissue Perfusion: Goal: Complications of ischemic stroke/TIA will be minimized 06/23/2024 0505 by Baldwin Doran BIRCH, RN Outcome: Not Progressing 06/22/2024 2053 by Baldwin Doran BIRCH, RN Outcome: Not Progressing   Problem: Coping: Goal: Will verbalize positive feelings about self 06/23/2024 0505 by Baldwin Doran BIRCH, RN Outcome: Not Progressing 06/22/2024 2053 by Baldwin Doran BIRCH, RN Outcome: Not Progressing Goal: Will identify appropriate support needs 06/23/2024 0505 by Baldwin Doran BIRCH, RN Outcome: Not Progressing 06/22/2024 2053 by Baldwin Doran BIRCH, RN Outcome: Not Progressing

## 2024-06-23 NOTE — Consult Note (Signed)
 Ophthalmology Consult  Follow up on left eye contact lens overwear.  Pt states left eye feels better.  Still with redness and mild blurred vision.  On exam pt was 20/25 OD and 20/40 OS with near card.  Pt with 1+ injection in the left eye but no longer with discharge or mucous.  Eyelid margins are now clean without matting of lashes.  No stain with flourescein.    A/P Contact lens overwear:  No longer with discharge and no stain with flourescein.  CPM with Gatifloxacin  and AT's.  I will see back tomorrow to make sure pt is continuing to improve.     Thank you for allowing me to participate in the care of this patient.  Please feel free to contact me if you have any concerns.  Evalene Raw, M.D. (Cell) [phone] (Office) [phone]

## 2024-06-23 NOTE — Evaluation (Addendum)
 Occupational Therapy Evaluation Patient Details Name: Michele Tyler MRN: [id number] DOB: [birth year] Today's Date: 06/23/2024   History of Present Illness   Pt is a 78 y/o F who presented to [facility] for headache and eye pain (questionably due to retained contact lens). 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 She was discharged home on Zio patch but results are pending. MRI brain this admission revealed new infarcts in L cerebellum and scattered infarcts in bilateral frontal and parietal lobes. PMHx: HTN, HLD.     Clinical Impressions Pt greeted seated EOB, agreeable for OT visit. Supportive friend, Michele Tyler, present. Following recent hospitalization, pt was mobile with RW and had assist for showers from her caregivers. Indep with all other BADLs. Enjoys walking along her long driveway. She was current with HHOT. Today, she presents with persistent cognitive deficits related to abstract thinking, insight/judgement, problem solving, sequencing, and metacognition. Did observe ? R inattention (based off pt poorly navigating obstacles in R environment), and reduced R>L peripheral vision. Functionally, she was no more than CGA for mobility with RW and CGA for UB/LB ADLs. Pt able to state accurate process for emergency preparedness and demonstrated ability to dial 911 with min cues and incr time.  Currently recommend resumption of HHOT for functional cognition, and assist from caregiver for IADLs (including transportation, financial and medication management. OT to continue to follow.      If plan is discharge home, recommend the following:   A little help with walking and/or transfers;A little help with bathing/dressing/bathroom;Assistance with cooking/housework;Direct supervision/assist for medications management;Direct supervision/assist for financial management;Assist for transportation;Supervision due to cognitive status     Functional Status  Assessment   Patient has had a recent decline in their functional status and demonstrates the ability to make significant improvements in function in a reasonable and predictable amount of time.     Equipment Recommendations   None recommended by OT     Recommendations for Other Services         Precautions/Restrictions   Precautions Precautions: Fall Recall of Precautions/Restrictions: Intact Restrictions Weight Bearing Restrictions Per Provider Order: No     Mobility Bed Mobility               General bed mobility comments: not assessed - pt received and left sitting EOB    Transfers Overall transfer level: Needs assistance Equipment used: None, Rolling walker (2 wheels) Transfers: Sit to/from Stand Sit to Stand: Contact guard assist           General transfer comment: Stood from bed with VC for powering up and sequencing      Balance Overall balance assessment: Needs assistance Sitting-balance support: No upper extremity supported, Feet supported Sitting balance-Leahy Scale: Fair Sitting balance - Comments: seated EOB, no overt LOB   Standing balance support: Bilateral upper extremity supported, During functional activity, Reliant on assistive device for balance Standing balance-Leahy Scale: Poor Standing balance comment: CGA to maintain, reliant on RW; pt with poor safe use of RW (often abandoning walker and parking it away from where she was ambulating to)                           ADL either performed or assessed with clinical judgement   ADL Overall ADL's : Needs assistance/impaired Eating/Feeding: Set up;Sitting   Grooming: Minimal assistance;Oral care;Standing Grooming Details (indicate cue type and reason): pt with little awareness she bumped majority of toothpaste off toothbrush with L  hand, pt attempting to place toothpaste bottle cap on upside down with incr time/effort to recognize and correct mistake Upper Body Bathing:  Set up   Lower Body Bathing: Contact guard assist   Upper Body Dressing : Set up   Lower Body Dressing: Contact guard assist   Toilet Transfer: Supervision/safety;Ambulation;Regular Toilet;Rolling walker (2 wheels);Contact guard assist Toilet Transfer Details (indicate cue type and reason): using grab bars to assist, cued for safe RW mgmt in/out of bathroom and for safe approach to toilet Toileting- Clothing Manipulation and Hygiene: Supervision/safety;Sitting/lateral lean Toileting - Clothing Manipulation Details (indicate cue type and reason): performed anterior peri hygiene seated on commode     Functional mobility during ADLs: Supervision/safety;Contact guard assist;Rolling walker (2 wheels)       Vision Baseline Vision/History: 1 Wears glasses Ability to See in Adequate Light: 0 Adequate Patient Visual Report: No change from baseline Vision Assessment?: Yes Eye Alignment: Within Functional Limits Ocular Range of Motion: Within Functional Limits Tracking/Visual Pursuits: Requires cues, head turns, or add eye shifts to track (suspecting 2/2 attention deficits) Saccades: Decreased speed of saccadic movement Additional Comments: reduced R peripheral vision to ~45* from midline to the R     Perception Perception: Impaired Preception Impairment Details: Inattention/Neglect Perception-Other Comments: ? mild R inattention, noting pt bumping into various items in hallway located in R environment   Praxis         Pertinent Vitals/Pain Pain Assessment Pain Assessment: Faces Faces Pain Scale: Hurts a little bit Pain Location: HA Pain Descriptors / Indicators: Headache Pain Intervention(s): Limited activity within patient's tolerance, Monitored during session     Extremity/Trunk Assessment Upper Extremity Assessment Upper Extremity Assessment: Overall WFL for tasks assessed           Communication Communication Communication: No apparent difficulties   Cognition  Arousal: Alert Behavior During Therapy: WFL for tasks assessed/performed Cognition: Cognition impaired   Orientation impairments:  (re-oriented; using external aides) Awareness: Online awareness impaired (intellectual awareness evolving) Memory impairment (select all impairments): Short-term memory Attention impairment (select first level of impairment): Selective attention, Alternating attention (evolving into alternating attention deficits) Executive functioning impairment (select all impairments): Organization, Sequencing, Reasoning, Problem solving OT - Cognition Comments: pt with difficulty with complex and abstract thinking                 Following commands: Impaired Following commands impaired: Follows one step commands with increased time, Follows multi-step commands inconsistently     Cueing  General Comments   Cueing Techniques: Verbal cues;Visual cues  friend, Michele Tyler, present and supportive   Exercises     Shoulder Instructions      Home Living Family/patient expects to be discharged to:: Private residence Living Arrangements: Alone Available Help at Discharge: Personal care attendant (7 days/wk from 10a-7p (assist with showering, functional cognition, and using her TV remote/cell phone)) Type of Home: House Home Access: Stairs to enter [employer] of Steps: 1 Entrance Stairs-Rails: Left Home Layout: Two level;1/2 bath on main level;Able to live on main level with bedroom/bathroom     Bathroom Shower/Tub: Tub/shower unit   Bathroom Toilet: Standard     Home Equipment: Grab bars - tub/shower;Rolling Walker (2 wheels);Cane - single point;Rollator (4 wheels)      Lives With: Alone    Prior Functioning/Environment Prior Level of Function : Needs assist  Cognitive Assist : ADLs (cognitive)   ADLs (Cognitive): Intermittent cues       Mobility Comments: indep without AD prior to previous admission in October  2025 ADLs Comments: prior to  previous admission, indep with ADLs/IADLs, was working as a agricultural consultant for the [employer]; since prior admission and d/c from SNF, pt has had assist from paid caregiver with functional cognition, IADLs, and showering    OT Problem List: Decreased activity tolerance;Impaired balance (sitting and/or standing);Impaired vision/perception;Decreased cognition;Decreased safety awareness;Decreased knowledge of use of DME or AE   OT Treatment/Interventions: Self-care/ADL training;Therapeutic exercise;DME and/or AE instruction;Therapeutic activities;Cognitive remediation/compensation;Patient/family education;Visual/perceptual remediation/compensation;Balance training      OT Goals(Current goals can be found in the care plan section)   Acute Rehab OT Goals Patient Stated Goal: get back to where I was and to return to volunteering OT Goal Formulation: With patient Time For Goal Achievement: 07/07/24 Potential to Achieve Goals: Fair   OT Frequency:  Min 2X/week    Co-evaluation              AM-PAC OT 6 Clicks Daily Activity     Outcome Measure Help from another person eating meals?: None Help from another person taking care of personal grooming?: A Little Help from another person toileting, which includes using toliet, bedpan, or urinal?: A Little Help from another person bathing (including washing, rinsing, drying)?: A Little Help from another person to put on and taking off regular upper body clothing?: None Help from another person to put on and taking off regular lower body clothing?: A Little 6 Click Score: 20   End of Session Equipment Utilized During Treatment: Gait belt;Rolling walker (2 wheels) Nurse Communication: Mobility status  Activity Tolerance: Patient tolerated treatment well Patient left: with call bell/phone within reach;with bed alarm set;with family/visitor present (seated EOB)  OT Visit Diagnosis: Unsteadiness on feet (R26.81);Cognitive  communication deficit (R41.841) Symptoms and signs involving cognitive functions: Cerebral infarction                Time: 1355-1433 OT Time Calculation (min): 38 min Charges:  OT General Charges $OT Visit: 1 Visit OT Evaluation $OT Eval Moderate Complexity: 1 Mod OT Treatments $Self Care/Home Management : 8-22 mins  Corynn Solberg M. [place], OTR/L [facility] Acute Rehabilitation Services [phone] Secure Chat Preferred  Michele Tyler 06/23/2024, 3:35 PM

## 2024-06-23 NOTE — Plan of Care (Signed)
  Problem: Education: Goal: Knowledge of disease or condition will improve Outcome: Progressing Goal: Knowledge of patient specific risk factors will improve (DELETE if not current risk factor) Outcome: Progressing   Problem: Health Behavior/Discharge Planning: Goal: Goals will be collaboratively established with patient/family Outcome: Progressing

## 2024-06-23 NOTE — Evaluation (Signed)
 Speech Language Pathology Evaluation Patient Details Name: Michele Tyler MRN: [id number] DOB: Sep 06, 1945 Today's Date: 06/23/2024 Time: 8879-8860 SLP Time Calculation (min) (ACUTE ONLY): 19 min  Problem List:  Patient Active Problem List   Diagnosis Date Noted   CVA (cerebral vascular accident) (HCC) 06/22/2024   Constipation 06/09/2024   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   Dizziness 05/11/2024   Nausea 04/12/2024   Hyponatremia 01/06/2024   Urinary and fecal incontinence 11/27/2023   Itchy scalp 09/05/2023   Iron deficiency 07/04/2019   B12 deficiency 07/04/2019   Lipoma 07/04/2019   Fear of other medical care- signficant fears of drug S-E inhibiting pt's ability to take medications 07/02/2019   Insomnia 03/25/2018   Hyperlipidemia 03/25/2018   Hypertension, goal below 150/90 02/18/2018   Mood disorder 02/18/2018   Generalized anxiety disorder 03/19/2013   Past Medical History:  Past Medical History:  Diagnosis Date   Anxiety    Depression    Hypertension    Stroke (cerebrum) [facility])    Past Surgical History:  Past Surgical History:  Procedure Laterality Date   DILATION AND CURETTAGE OF UTERUS  2002   FACELIFT  2005   TRANSESOPHAGEAL ECHOCARDIOGRAM (CATH LAB) N/A 06/16/2024   Procedure: TRANSESOPHAGEAL ECHOCARDIOGRAM;  Surgeon: Delford Maude BROCKS, MD;  Location: [facility] INVASIVE CV LAB;  Service: Cardiovascular;  Laterality: N/A;   HPI:  Pt is a 78 y/o F who presented from home after SNF admision with headache and eye pain (questionably due to retained contact lens). MRI brain revealed new infarcts in L cerebellum and scattered infarcts in bilateral frontal and parietal lobes. Recent admission 05/13/24 for R cerebellar infarct and cardio-embolic shower. SLE 05/13/24 scored 21/30 on SLUMS recommended ST at [facility] or OP, no f/u on acute. PMHx: HTN, HLD.   Assessment / Plan / Recommendation Clinical Impression  Pt is aware of cognitive deficits and states she is receiving home  health ST and would like to continue therapy at home. Pt lives alone with personal care attendant for majority of the day who assists with med management. Overall she had slight improvement of performance on the SLUMS compared to prior admission increasing score from 21/30 to 23/30 in the mild range for cognitive deficits. Her speech is intelligible and language intact. She improved in calculations, delayed recall with 5/5 without cues and was able to accurately recall information relayed from MD during session with a delay. Impairments mostly  noted in verbal and functional problem solving. Defer continued intervention with home health ST with continued assist with caregiver and pt in agreement.    SLP Assessment  SLP Recommendation/Assessment: All further Speech Language Pathology needs can be addressed in the next venue of care SLP Visit Diagnosis: Cognitive communication deficit (R41.841)     Assistance Recommended at Discharge  Intermittent Supervision/Assistance  Functional Status Assessment Patient has not had a recent decline in their functional status  Frequency and Duration           SLP Evaluation Cognition  Overall Cognitive Status: History of cognitive impairments - at baseline (impairments continue- overall similar to last admission scoring slightly higher on evaluation) Arousal/Alertness: Awake/alert Orientation Level: Oriented X4 Year: 2025 Month: November Day of Week: Incorrect Attention: Sustained Sustained Attention: Appears intact Memory: Appears intact (5/5 word recall and recalled accurately info relayed from MD during session) Awareness: Appears intact (aware of cog deficits and would like to continue HH ST) Problem Solving: Impaired Problem Solving Impairment: Functional basic Safety/Judgment: Impaired       Comprehension  Auditory Comprehension Overall Auditory Comprehension: Appears within functional limits for tasks assessed Visual  Recognition/Discrimination Discrimination: Not tested Reading Comprehension Reading Status: Not tested    Expression Expression Primary Mode of Expression: Verbal Verbal Expression Overall Verbal Expression: Appears within functional limits for tasks assessed Written Expression Dominant Hand: Left Written Expression: Not tested   Oral / Motor  Motor Speech Overall Motor Speech: Appears within functional limits for tasks assessed            Dustin Olam Bull 06/23/2024, 2:23 PM

## 2024-06-24 ENCOUNTER — Observation Stay (HOSPITAL_COMMUNITY)

## 2024-06-24 DIAGNOSIS — I639 Cerebral infarction, unspecified: Secondary | ICD-10-CM | POA: Diagnosis not present

## 2024-06-24 DIAGNOSIS — I10 Essential (primary) hypertension: Secondary | ICD-10-CM | POA: Diagnosis not present

## 2024-06-24 DIAGNOSIS — J90 Pleural effusion, not elsewhere classified: Secondary | ICD-10-CM | POA: Diagnosis not present

## 2024-06-24 DIAGNOSIS — I6340 Cerebral infarction due to embolism of unspecified cerebral artery: Secondary | ICD-10-CM | POA: Diagnosis not present

## 2024-06-24 DIAGNOSIS — R188 Other ascites: Secondary | ICD-10-CM | POA: Diagnosis not present

## 2024-06-24 LAB — CBC WITH DIFFERENTIAL/PLATELET
Abs Immature Granulocytes: 0.02 K/uL (ref 0.00–0.07)
Basophils Absolute: 0 K/uL (ref 0.0–0.1)
Basophils Relative: 1 %
Eosinophils Absolute: 0.2 K/uL (ref 0.0–0.5)
Eosinophils Relative: 3 %
HCT: 31.3 % — ABNORMAL LOW (ref 36.0–46.0)
Hemoglobin: 10.5 g/dL — ABNORMAL LOW (ref 12.0–15.0)
Immature Granulocytes: 0 %
Lymphocytes Relative: 8 %
Lymphs Abs: 0.6 K/uL — ABNORMAL LOW (ref 0.7–4.0)
MCH: 27.9 pg (ref 26.0–34.0)
MCHC: 33.5 g/dL (ref 30.0–36.0)
MCV: 83 fL (ref 80.0–100.0)
Monocytes Absolute: 0.9 K/uL (ref 0.1–1.0)
Monocytes Relative: 12 %
Neutro Abs: 5.6 K/uL (ref 1.7–7.7)
Neutrophils Relative %: 76 %
Platelets: 527 K/uL — ABNORMAL HIGH (ref 150–400)
RBC: 3.77 MIL/uL — ABNORMAL LOW (ref 3.87–5.11)
RDW: 13 % (ref 11.5–15.5)
WBC: 7.3 K/uL (ref 4.0–10.5)
nRBC: 0 % (ref 0.0–0.2)

## 2024-06-24 LAB — COMPREHENSIVE METABOLIC PANEL WITH GFR
ALT: 10 U/L (ref 0–44)
AST: 17 U/L (ref 15–41)
Albumin: 2.6 g/dL — ABNORMAL LOW (ref 3.5–5.0)
Alkaline Phosphatase: 62 U/L (ref 38–126)
Anion gap: 9 (ref 5–15)
BUN: 11 mg/dL (ref 8–23)
CO2: 26 mmol/L (ref 22–32)
Calcium: 8.2 mg/dL — ABNORMAL LOW (ref 8.9–10.3)
Chloride: 96 mmol/L — ABNORMAL LOW (ref 98–111)
Creatinine, Ser: 0.84 mg/dL (ref 0.44–1.00)
GFR, Estimated: >60 mL/min (ref >60)
Glucose, Bld: 101 mg/dL — ABNORMAL HIGH (ref 70–99)
Potassium: 4.6 mmol/L (ref 3.5–5.1)
Sodium: 131 mmol/L — ABNORMAL LOW (ref 135–145)
Total Bilirubin: 0.4 mg/dL (ref 0.0–1.2)
Total Protein: 5.1 g/dL — ABNORMAL LOW (ref 6.5–8.1)

## 2024-06-24 LAB — SODIUM, URINE, RANDOM: Sodium, Ur: 85 mmol/L

## 2024-06-24 LAB — OSMOLALITY, URINE: Osmolality, Ur: 389 mosm/kg

## 2024-06-24 LAB — MAGNESIUM: Magnesium: 1.9 mg/dL (ref 1.7–2.4)

## 2024-06-24 MED ORDER — ASPIRIN 81 MG PO TBEC: 81.0000 mg | DELAYED_RELEASE_TABLET | Freq: Every day | ORAL | Status: AC

## 2024-06-24 MED ORDER — POLYVINYL ALCOHOL 1.4 % OP SOLN: 1.0000 [drp] | OPHTHALMIC | 0 refills | Status: AC | PRN

## 2024-06-24 MED ORDER — CLOPIDOGREL BISULFATE 75 MG PO TABS: 75.0000 mg | ORAL_TABLET | Freq: Every day | ORAL | 0 refills | Status: AC

## 2024-06-24 MED ORDER — GATIFLOXACIN 0.5 % OP SOLN: 1.0000 [drp] | Freq: Four times a day (QID) | OPHTHALMIC | 0 refills | Status: AC

## 2024-06-24 NOTE — Plan of Care (Signed)
  Problem: Education: Goal: Knowledge of disease or condition will improve 06/24/2024 1451 by Berkeley Verdie BRAVO, RN Outcome: Adequate for Discharge 06/24/2024 1000 by Berkeley Verdie BRAVO, RN Outcome: Progressing Goal: Knowledge of secondary prevention will improve (MUST DOCUMENT ALL) Outcome: Adequate for Discharge Goal: Knowledge of patient specific risk factors will improve (DELETE if not current risk factor) 06/24/2024 1451 by Berkeley Verdie BRAVO, RN Outcome: Adequate for Discharge 06/24/2024 1000 by Berkeley Verdie BRAVO, RN Outcome: Progressing   Problem: Ischemic Stroke/TIA Tissue Perfusion: Goal: Complications of ischemic stroke/TIA will be minimized Outcome: Adequate for Discharge   Problem: Coping: Goal: Will verbalize positive feelings about self Outcome: Adequate for Discharge Goal: Will identify appropriate support needs Outcome: Adequate for Discharge   Problem: Health Behavior/Discharge Planning: Goal: Ability to manage health-related needs will improve Outcome: Adequate for Discharge Goal: Goals will be collaboratively established with patient/family 06/24/2024 1451 by Berkeley Verdie BRAVO, RN Outcome: Adequate for Discharge 06/24/2024 1000 by Berkeley Verdie BRAVO, RN Outcome: Progressing   Problem: Self-Care: Goal: Ability to participate in self-care as condition permits will improve Outcome: Adequate for Discharge Goal: Verbalization of feelings and concerns over difficulty with self-care will improve Outcome: Adequate for Discharge Goal: Ability to communicate needs accurately will improve Outcome: Adequate for Discharge   Problem: Nutrition: Goal: Risk of aspiration will decrease Outcome: Adequate for Discharge Goal: Dietary intake will improve Outcome: Adequate for Discharge   Problem: Education: Goal: Knowledge of General Education information will improve Description: Including pain rating scale, medication(s)/side effects and non-pharmacologic comfort measures Outcome:  Adequate for Discharge   Problem: Health Behavior/Discharge Planning: Goal: Ability to manage health-related needs will improve Outcome: Adequate for Discharge   Problem: Clinical Measurements: Goal: Ability to maintain clinical measurements within normal limits will improve Outcome: Adequate for Discharge Goal: Will remain free from infection Outcome: Adequate for Discharge Goal: Diagnostic test results will improve Outcome: Adequate for Discharge Goal: Respiratory complications will improve Outcome: Adequate for Discharge Goal: Cardiovascular complication will be avoided Outcome: Adequate for Discharge   Problem: Activity: Goal: Risk for activity intolerance will decrease Outcome: Adequate for Discharge   Problem: Nutrition: Goal: Adequate nutrition will be maintained Outcome: Adequate for Discharge   Problem: Coping: Goal: Level of anxiety will decrease Outcome: Adequate for Discharge   Problem: Elimination: Goal: Will not experience complications related to bowel motility Outcome: Adequate for Discharge Goal: Will not experience complications related to urinary retention Outcome: Adequate for Discharge   Problem: Pain Managment: Goal: General experience of comfort will improve and/or be controlled Outcome: Adequate for Discharge   Problem: Safety: Goal: Ability to remain free from injury will improve Outcome: Adequate for Discharge   Problem: Skin Integrity: Goal: Risk for impaired skin integrity will decrease Outcome: Adequate for Discharge

## 2024-06-24 NOTE — Progress Notes (Signed)
 BLE venous duplex has been completed.   Results can be found under chart review under CV PROC. 06/24/2024 11:55 AM Maimouna Rondeau RVT, RDMS

## 2024-06-24 NOTE — Progress Notes (Addendum)
 STROKE TEAM PROGRESS NOTE    SIGNIFICANT HOSPITAL EVENTS  11/25: presented w/ eye pain   MRI brain: acute infarct in the posterior left cerebellum and scattered acute cortical and subcortical infarcts in the bilateral frontal and parietal lobes which are new from her prior MRI   INTERIM HISTORY/SUBJECTIVE  No acute neurologic events overnight.  Stable neuroexam.  OBJECTIVE  CBC    Component Value Date/Time   WBC 7.3 06/24/2024 0230   RBC 3.77 (L) 06/24/2024 0230   HGB 10.5 (L) 06/24/2024 0230   HGB 12.5 05/10/2024 1432   HCT 31.3 (L) 06/24/2024 0230   HCT 38.2 05/10/2024 1432   PLT 527 (H) 06/24/2024 0230   PLT 497 (H) 05/10/2024 1432   MCV 83.0 06/24/2024 0230   MCV 89 05/10/2024 1432   MCH 27.9 06/24/2024 0230   MCHC 33.5 06/24/2024 0230   RDW 13.0 06/24/2024 0230   RDW 13.1 05/10/2024 1432   LYMPHSABS 0.6 (L) 06/24/2024 0230   LYMPHSABS 0.4 (L) 05/10/2024 1432   MONOABS 0.9 06/24/2024 0230   EOSABS 0.2 06/24/2024 0230   EOSABS 0.0 05/10/2024 1432   BASOSABS 0.0 06/24/2024 0230   BASOSABS 0.0 05/10/2024 1432    BMET    Component Value Date/Time   NA 131 (L) 06/24/2024 0230   NA 125 (L) 06/09/2024 1144   K 4.6 06/24/2024 0230   CL 96 (L) 06/24/2024 0230   CO2 26 06/24/2024 0230   GLUCOSE 101 (H) 06/24/2024 0230   BUN 11 06/24/2024 0230   BUN 12 06/09/2024 1144   CREATININE 0.84 06/24/2024 0230   CALCIUM  8.2 (L) 06/24/2024 0230   EGFR 96 06/09/2024 1144   GFRNONAA >60 06/24/2024 0230    IMAGING past 24 hours VAS US  LOWER EXTREMITY VENOUS (DVT) Result Date: 06/24/2024  Lower Venous DVT Study Patient Name:  Michele Tyler  Date of Exam:   06/24/2024 Medical Rec #: [id number]        Accession #:    [id number] Date of Birth: 1945-09-26       Patient Gender: F Patient Age:   78 years Exam Location:  [facility] Procedure:      VAS US  LOWER EXTREMITY VENOUS (DVT) Referring Phys: ARY Tiyah Zelenak  --------------------------------------------------------------------------------  Indications: Stroke.  Comparison Study: Previous exam on 05/13/2024 was negative for DVT Performing Technologist: Ezzie Potters RVT, RDMS  Examination Guidelines: A complete evaluation includes B-mode imaging, spectral Doppler, color Doppler, and power Doppler as needed of all accessible portions of each vessel. Bilateral testing is considered an integral part of a complete examination. Limited examinations for reoccurring indications may be performed as noted. The reflux portion of the exam is performed with the patient in reverse Trendelenburg.  +---------+---------------+---------+-----------+----------+--------------+ RIGHT    CompressibilityPhasicitySpontaneityPropertiesThrombus Aging +---------+---------------+---------+-----------+----------+--------------+ CFV      Full           Yes      Yes                                 +---------+---------------+---------+-----------+----------+--------------+ SFJ      Full                                                        +---------+---------------+---------+-----------+----------+--------------+ FV Prox  Full  Yes      Yes                                 +---------+---------------+---------+-----------+----------+--------------+ FV Mid   Full           Yes      Yes                                 +---------+---------------+---------+-----------+----------+--------------+ FV DistalFull           Yes      Yes                                 +---------+---------------+---------+-----------+----------+--------------+ PFV      Full                                                        +---------+---------------+---------+-----------+----------+--------------+ POP      Full           Yes      Yes                                 +---------+---------------+---------+-----------+----------+--------------+ PTV      Full                                                         +---------+---------------+---------+-----------+----------+--------------+ PERO     Full                                                        +---------+---------------+---------+-----------+----------+--------------+   +---------+---------------+---------+-----------+----------+--------------+ LEFT     CompressibilityPhasicitySpontaneityPropertiesThrombus Aging +---------+---------------+---------+-----------+----------+--------------+ CFV      Full           Yes      Yes                                 +---------+---------------+---------+-----------+----------+--------------+ SFJ      Full                                                        +---------+---------------+---------+-----------+----------+--------------+ FV Prox  Full           Yes      Yes                                 +---------+---------------+---------+-----------+----------+--------------+ FV Mid   Full           Yes      Yes                                 +---------+---------------+---------+-----------+----------+--------------+  FV DistalFull           Yes      Yes                                 +---------+---------------+---------+-----------+----------+--------------+ PFV      Full                                                        +---------+---------------+---------+-----------+----------+--------------+ POP      Full           Yes      Yes                                 +---------+---------------+---------+-----------+----------+--------------+ PTV      Full                                                        +---------+---------------+---------+-----------+----------+--------------+ PERO     Full                                                        +---------+---------------+---------+-----------+----------+--------------+     Summary: BILATERAL: - No evidence of deep vein thrombosis seen in the  lower extremities, bilaterally. -No evidence of popliteal cyst, bilaterally. RIGHT: Subcutaneous edema of calf.   *See table(s) above for measurements and observations.    Preliminary    IR Paracentesis Result Date: 06/23/2024 INDICATION: Patient in the hospital with headache, left eye infection with discharge, acute brain infarct. CT chest abdomen pelvis obtained in general workup, patient found to have ascites. Request for diagnostic and therapeutic paracentesis. EXAM: ULTRASOUND GUIDED DIAGNOSTIC AND THERAPEUTIC PARACENTESIS MEDICATIONS: 8 mL 1% lidocaine  COMPLICATIONS: None immediate. PROCEDURE: Informed written consent was obtained from the patient after a discussion of the risks, benefits and alternatives to treatment. A timeout was performed prior to the initiation of the procedure. Initial ultrasound scanning demonstrates a small amount of ascites within the right lower abdominal quadrant. The right lower abdomen was prepped and draped in the usual sterile fashion. 1% lidocaine  was used for local anesthesia. Following this, a 19 gauge, 7-cm, Yueh catheter was introduced. An ultrasound image was saved for documentation purposes. The paracentesis was performed. The catheter was removed and a dressing was applied. The patient tolerated the procedure well without immediate post procedural complication. FINDINGS: A total of approximately 3 liters of straw colored fluid was removed. Samples were sent to the laboratory as requested by the clinical team. IMPRESSION: Successful ultrasound-guided paracentesis yielding 3 liters of peritoneal fluid. Performed by: Wyatt Pommier, PA-C Electronically Signed   By: Juliene Balder M.D.   On: 06/23/2024 17:13    Vitals:   06/23/24 2309 06/24/24 0326 06/24/24 0739 06/24/24 1200  BP: (!) 153/69 124/67 137/83 (!) 150/80  Pulse: 74 80 92 98  Resp: 18 18 17 14   Temp: 97.7 F (36.5 C)  97.7 F (36.5 C) 98.2 F (36.8 C) 97.9 F (36.6 C)  TempSrc: Oral Oral Oral Oral   SpO2: 99% 94% 99% 97%  Weight:      Height:       PHYSICAL EXAM General:  Alert, well-nourished, well-developed patient in no acute distress Psych:  Mood and affect appropriate for situation CV: Regular rate and rhythm on monitor Respiratory:  Regular, unlabored respirations on room air GI: Abdomen soft and nontender   NEURO:  Mental Status: AA&Ox3, patient is able to give clear and coherent history Speech/Language: speech is without dysarthria or aphasia.  Naming, repetition, fluency, and comprehension intact.  Cranial Nerves:  II: PERRL. Visual fields full.  III, IV, VI: EOMI. Eyelids elevate symmetrically.  V: Sensation is intact to light touch and symmetrical to face.  VII: Face is symmetrical resting and smiling VIII: hearing intact to voice. IX, X: Palate elevates symmetrically. Phonation is normal.  KP:Michele Tyler shrug 5/5. XII: tongue is midline without fasciculations. Motor: 5/5 strength to all muscle groups tested.  Tone: is normal and bulk is normal Sensation- Intact to light touch bilaterally. Extinction absent to light touch to DSS.   Coordination: FTN intact bilaterally with bilateral ataxia. Gait- deferred  Most Recent NIH: 2   ASSESSMENT/PLAN  Michele Tyler is a 78 y.o. female with history of HTN, HLD, recent cardio embolic stroke presenting with eye pain and headache. So far her workup has been unrevealing for the cause of her previous suspected embolic stroke.  She was discharged 05/20/24 with DAPT for 3 weeks and then aspirin  81 mg alone. Strong suspicion remains for an embolic source, admitted for continued workup.  NIH on Admission 2.  Recurrent embolic strokes: bilateral embolic shower, etiology: concerning for hypercoagulable state from ? Malignancy ? CT head Since October 15, new/interval acute or subacute left cerebellar infarct. Additional remote infarcts and chronic microvascular ischemic change. MRI  Acute infarct in the posterior left  cerebellum involving a portion of the left PICA territory. Numerous additional scattered acute cortical and subcortical infarcts in the bilateral frontal and parietal lobes which are new from prior. Additional bilateral subacute cortical infarcts corresponding to areas of infarct on the prior MRI. 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 No PFO LDL 58 HgbA1c 5.5 in 04/2024 VTE prophylaxis - Heoarin SQ aspirin  81 mg daily (just finished DAPT for 3 weeks) prior to admission, continue aspirin  81 mg daily and clopidogrel  75 mg daily for 3 weeks, then Plavix  only  Therapy recommendations:  HHPT Disposition:  pending  Ascites and pleural effusion CT CAP:  Large volume ascites throughout the abdomen and pelvis. Bilateral pleural effusions with compressive lower lobe atelectasis. S/p paracentesis with 1.5L ascites removal Pending pathology (will need to f/u with PCP post-discharge) Culture negative Repeat LE venous doppler again negative for DVT Needs to rule out advanced malignancy  Hx of Stroke/TIA Admitted 05/13/24 and was found to have multifocal bilateral infarcts, embolic pattern, thought to be secondary to cardioembolic source.  CT head and neck unremarkable.  LE venous Doppler negative for DVT.  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  Zio patch placed.  LDL 47, A1c 5.5.  Discharged on DAPT and Lipitor 10. 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 Zio patch  preliminary report no AF  Left eye pain Ophthalmology consult: retained contact in left eye.  Contacts removed by ophthalmology with cornea no longer stained and no corneal ulcer present. Patient was started on gatifloxacin  4 times daily and left eye secondary to  contact lens over wear and mild discharge.   Ophthalmology will follow, f/u outpatient Pt symptoms resolved  Hypertension Home meds:  none Stable Long term BP goal normalize.   Hyperlipidemia Home meds:  Lipitor 10mg , continued LDL 58, goal < 70 High intensity statin not indicated due to LDL within goal on current treatment Continue statin at discharge  Other Stroke Risk Factors Advanced age Former smoker   Other Active Problems Hyponatremia - likely chronic, Na 132-131   Hospital day # 0  Patient is OK for discharge from neurology standpoint, with recommendations as above. Follow-up with outpatient neurology in 8 weeks--is an [facility] patient at [facility] following previous stroke (no need for ambulatory referral)   Pt seen by Neuro NP/APP and later by MD. Note/plan to be edited by MD as needed.    Michele JAYSON Likes, DNP [facility] Please use AMION for contact information & EPIC for messaging.  ATTENDING NOTE: I reviewed above note and agree with the assessment and plan. Pt was seen and examined.   No acute event overnight. Neuro stable. Repeat LE venous doppler negative again. On DAPT and statin. Needs to follow up as outpt with pathology result. Will need to follow up with [facility] closely.   For detailed assessment and plan, please refer to above as I have made changes wherever appropriate.   Neurology will sign off. Please call with questions. Pt will follow up with stroke clinic Dr. Rosemarie at [facility] in about 2-3 weeks. Thanks for the consult.   Ary Cummins, MD PhD Stroke Neurology 06/25/2024 2:02 AM

## 2024-06-24 NOTE — Plan of Care (Signed)
  Problem: Education: Goal: Knowledge of disease or condition will improve Outcome: Progressing Goal: Knowledge of patient specific risk factors will improve (DELETE if not current risk factor) Outcome: Progressing   Problem: Health Behavior/Discharge Planning: Goal: Goals will be collaboratively established with patient/family Outcome: Progressing

## 2024-06-24 NOTE — Discharge Summary (Addendum)
 Physician Discharge Summary   Patient: Michele Tyler MRN: [id number] DOB: [birth year]  Admit date:     06/22/2024  Discharge date: 06/24/24  Discharge Physician: Michele Tyler   PCP: Michele Toribio POUR, MD   Recommendations at discharge:    Follow up with PCP as will be scheduled Please follow up on ascites cytology Follow up with Neurology as scheduled Follow up with Ophthalmology as scheduled  Discharge Diagnoses: Principal Problem:   CVA (cerebral vascular accident) [facility])  Resolved Problems:   * No resolved hospital problems. *  Hospital Course: 78 y.o. female, with history of recent cerebellar stroke about a month ago, hypertension, dyslipidemia, anxiety, depression who recently had a thorough stroke workup including a TEE about a month ago which was unremarkable presents to the hospital with 2 to 3-day history of mild headache behind her left eye, left eye itching and yellowish left eye discharge, she denies any photophobia, denies any pain with eye movement, she presented to the ER for this complaint.  In the ER she had a stroke workup which showed positive acute cerebellar infarct on MRI, neurology was consulted. Hospitalist consulted for consideration for admission  Assessment and Plan: 1.  Left periorbital headache, left eye infection and discharge.   -Ophthalmology consulted -Pt found to have retained contact lens in the L eye -Pt started on Gatifloxacin  QID in L eye - Ophthalmology to f/u with pt in office at 9am on 11/28   2.  Recent history of cerebellar stroke, MRI now confirms Acute infarct in the posterior left cerebellum involving a portion of the left PICA territory along with numerous additional scattered acute cortical and subcortical infarcts in the bilateral frontal and parietal lobes  -Neurology following -Now on ASA 81mg  and plavix  75mg . Per Neuro, recs for DAPT x 3 weeks, then plavix  alone afterwards -therapy recs for [facility] therapy noted   3.  Dyslipidemia.    -cont lipitor 10mg    4.  Hypertension. -bp stable at this time   5.  Chronic hyponatremia.   -Na improved   6.  Chronic iron deficiency anemia.  Stable.   7.  Mild cognitive decline.  At risk for delirium.  Monitor.   8. Large ascites -unclear etiology -s/p US  diagnostic and therapeutic paracentesis 11/26 -Fluid analysis reviewed, ruled out SBP with ANC 55 -Lipase normal, benign abd with no inflammatation seen on CT -Concerns for possible underlying malignancy. Will ask PCP to f/u on cytology results -Pt reports planning screening Cologuard in the near future. Suspect pt may benefit more from colonoscopy    9. HTN.  -BP meds were initially held to allow permissive hypertension -BP had been stable and relatively controlled. At time of d/c, bpt began to trend up -Resume home bp med on d/c    Consultants: Neurology, Ophthalmology  Procedures performed:   Disposition: Home Diet recommendation:  Cardiac diet DISCHARGE MEDICATION: Allergies as of 06/24/2024       Reactions   Codeine Nausea Only        Medication List     STOP taking these medications    olmesartan  40 MG tablet Commonly known as: BENICAR        TAKE these medications    artificial tears ophthalmic solution Place 1 drop into both eyes as needed for dry eyes.   aspirin  EC 81 MG tablet Take 1 tablet (81 mg total) by mouth daily for 21 days. Swallow whole.   atorvastatin  10 MG tablet Commonly known as: LIPITOR TAKE 1 TABLET BY MOUTH  AT  BEDTIME   B-12 1000 MCG Caps Take 1,000 mcg by mouth in the morning.   BALANCED B-150 COMPLEX TR PO Take 1 tablet by mouth See admin instructions. Take 1 tablet by mouth once a day (contains b-1 @ 150 mg, b-2 150 mg, b-6 150 mg, b-12 @ 150 mg, and niacin 150 mg)   BALANCED B-150 PO Take 1 capsule by mouth daily.   busPIRone  7.5 MG tablet Commonly known as: BUSPAR  TAKE 1 TABLET BY MOUTH 3 TIMES  DAILY What changed: when to take this   CALCIUM   MAGNESIUM ZINC PO Take 1 tablet by mouth in the morning.   clopidogrel  75 MG tablet Commonly known as: PLAVIX  Take 1 tablet (75 mg total) by mouth daily. Start taking on: June 25, 2024   folic acid  800 MCG tablet Commonly known as: FOLVITE  Take 800 mcg by mouth daily.   gatifloxacin  0.5 % Soln Commonly known as: ZYMAXID  Place 1 drop into the left eye 4 (four) times daily for 5 days.   Iron 325 (65 Fe) MG Tabs Take 325 mg by mouth daily with breakfast.   OMEGA-3 FISH OIL PO Take 4,000 mg by mouth daily.   ondansetron  4 MG tablet Commonly known as: ZOFRAN  Take 1 tablet (4 mg total) by mouth every 8 (eight) hours as needed for nausea or vomiting.   polyethylene glycol powder 17 GM/SCOOP powder Commonly known as: GLYCOLAX /MIRALAX  Take 17 g by mouth daily as needed for mild constipation. Dissolve 1 capful (17g) in 4-8 ounces of liquid and take by mouth daily.   vitamin A 8000 UNIT capsule Take 8,000 Units by mouth daily.   VITAMIN C CR 1500 MG Tbcr Take 1,500 mg by mouth daily.   Vitamin D3 125 MCG (5000 UT) Caps Take 5,000 Units by mouth daily with breakfast.   VITAMIN E-1000 PO Take 1,000 Units by mouth daily.        Follow-up Information     [facility]) Follow up.   Specialty: Home Health Services Why: [facility] will continue to provide home health services. Contact information: [address] [place]  [address] [phone]        Michele Toribio POUR, MD. Schedule an appointment as soon as possible for a visit.   Specialty: Family Medicine Contact information: [address] [place] [address] [phone]                Discharge Exam: Michele Tyler   06/22/24 1152  Weight: 50.8 kg   General exam: Awake, laying in bed, in nad Respiratory system: Normal respiratory effort, no wheezing Cardiovascular system: regular rate, s1, s2 Gastrointestinal system:  Soft, nondistended, positive BS Central nervous system: CN2-12 grossly intact, strength intact Extremities: Perfused, no clubbing Skin: Normal skin turgor, no notable skin lesions seen Psychiatry: Mood normal // no visual hallucinations   Condition at discharge: fair  The results of significant diagnostics from this hospitalization (including imaging, microbiology, ancillary and laboratory) are listed below for reference.   Imaging Studies: VAS US  LOWER EXTREMITY VENOUS (DVT) Result Date: 06/24/2024  Lower Venous DVT Study Patient Name:  REEDA SOOHOO  Date of Exam:   06/24/2024 Medical Rec #: [id number]        Accession #:    [id number] Date of Birth: November 18, 1945       Patient Gender: F Patient Age:   34 years Exam Location:  [facility] Procedure:  VAS US  LOWER EXTREMITY VENOUS (DVT) Referring Phys: ARY XU --------------------------------------------------------------------------------  Indications: Stroke.  Comparison Study: Previous exam on 05/13/2024 was negative for DVT Performing Technologist: Ezzie Potters RVT, RDMS  Examination Guidelines: A complete evaluation includes B-mode imaging, spectral Doppler, color Doppler, and power Doppler as needed of all accessible portions of each vessel. Bilateral testing is considered an integral part of a complete examination. Limited examinations for reoccurring indications may be performed as noted. The reflux portion of the exam is performed with the patient in reverse Trendelenburg.  +---------+---------------+---------+-----------+----------+--------------+ RIGHT    CompressibilityPhasicitySpontaneityPropertiesThrombus Aging +---------+---------------+---------+-----------+----------+--------------+ CFV      Full           Yes      Yes                                 +---------+---------------+---------+-----------+----------+--------------+ SFJ      Full                                                         +---------+---------------+---------+-----------+----------+--------------+ FV Prox  Full           Yes      Yes                                 +---------+---------------+---------+-----------+----------+--------------+ FV Mid   Full           Yes      Yes                                 +---------+---------------+---------+-----------+----------+--------------+ FV DistalFull           Yes      Yes                                 +---------+---------------+---------+-----------+----------+--------------+ PFV      Full                                                        +---------+---------------+---------+-----------+----------+--------------+ POP      Full           Yes      Yes                                 +---------+---------------+---------+-----------+----------+--------------+ PTV      Full                                                        +---------+---------------+---------+-----------+----------+--------------+ PERO     Full                                                        +---------+---------------+---------+-----------+----------+--------------+   +---------+---------------+---------+-----------+----------+--------------+  LEFT     CompressibilityPhasicitySpontaneityPropertiesThrombus Aging +---------+---------------+---------+-----------+----------+--------------+ CFV      Full           Yes      Yes                                 +---------+---------------+---------+-----------+----------+--------------+ SFJ      Full                                                        +---------+---------------+---------+-----------+----------+--------------+ FV Prox  Full           Yes      Yes                                 +---------+---------------+---------+-----------+----------+--------------+ FV Mid   Full           Yes      Yes                                  +---------+---------------+---------+-----------+----------+--------------+ FV DistalFull           Yes      Yes                                 +---------+---------------+---------+-----------+----------+--------------+ PFV      Full                                                        +---------+---------------+---------+-----------+----------+--------------+ POP      Full           Yes      Yes                                 +---------+---------------+---------+-----------+----------+--------------+ PTV      Full                                                        +---------+---------------+---------+-----------+----------+--------------+ PERO     Full                                                        +---------+---------------+---------+-----------+----------+--------------+     Summary: BILATERAL: - No evidence of deep vein thrombosis seen in the lower extremities, bilaterally. -No evidence of popliteal cyst, bilaterally. RIGHT: Subcutaneous edema of calf.   *See table(s) above for measurements and observations.    Preliminary    IR Paracentesis Result Date: 06/23/2024 INDICATION: Patient in the hospital with headache, left eye infection with discharge, acute brain  infarct. CT chest abdomen pelvis obtained in general workup, patient found to have ascites. Request for diagnostic and therapeutic paracentesis. EXAM: ULTRASOUND GUIDED DIAGNOSTIC AND THERAPEUTIC PARACENTESIS MEDICATIONS: 8 mL 1% lidocaine  COMPLICATIONS: None immediate. PROCEDURE: Informed written consent was obtained from the patient after a discussion of the risks, benefits and alternatives to treatment. A timeout was performed prior to the initiation of the procedure. Initial ultrasound scanning demonstrates a small amount of ascites within the right lower abdominal quadrant. The right lower abdomen was prepped and draped in the usual sterile fashion. 1% lidocaine  was used for local anesthesia.  Following this, a 19 gauge, 7-cm, Yueh catheter was introduced. An ultrasound image was saved for documentation purposes. The paracentesis was performed. The catheter was removed and a dressing was applied. The patient tolerated the procedure well without immediate post procedural complication. FINDINGS: A total of approximately 3 liters of straw colored fluid was removed. Samples were sent to the laboratory as requested by the clinical team. IMPRESSION: Successful ultrasound-guided paracentesis yielding 3 liters of peritoneal fluid. Performed by: Wyatt Pommier, PA-C Electronically Signed   By: Juliene Balder M.D.   On: 06/23/2024 17:13   CT CHEST ABDOMEN PELVIS W CONTRAST Result Date: 06/22/2024 EXAM: CT CHEST, ABDOMEN AND PELVIS WITH CONTRAST 06/22/2024 08:22:00 PM TECHNIQUE: CT of the chest, abdomen and pelvis was performed with the administration of intravenous contrast (50 mL iohexol  (OMNIPAQUE ) 350 MG/ML injection). Multiplanar reformatted images are provided for review. Automated exposure control, iterative reconstruction, and/or weight based adjustment of the mA/kV was utilized to reduce the radiation dose to as low as reasonably achievable. COMPARISON: None available. CLINICAL HISTORY: r/o malignancy. FINDINGS: CHEST: MEDIASTINUM AND LYMPH NODES: Heart and pericardium are unremarkable. The central airways are clear. No mediastinal, hilar or axillary lymphadenopathy. Scattered coronary artery and aortic atherosclerosis. LUNGS AND PLEURA: Bilateral pleural effusions. Compressive atelectasis in the lower lobes bilaterally. No focal consolidation or pulmonary edema. No pneumothorax. ABDOMEN AND PELVIS: LIVER: Scattered cystic areas in the liver, the largest in the posterior right hepatic lobe measuring 1.7 cm. These appear benign. GALLBLADDER AND BILE DUCTS: Gallbladder is unremarkable. No biliary ductal dilatation. SPLEEN: No acute abnormality. PANCREAS: No acute abnormality. ADRENAL GLANDS: No suspicious  adrenal abnormality. KIDNEYS, URETERS AND BLADDER: Mild left hydronephrosis. The left ureter is difficult to follow, but no obstructing stones seen. 1.3 cm simple appearing cyst in the mid pole of the left kidney. No suspicious renal abnormality. No stones in the right kidney. No perinephric or periureteral stranding. Urinary bladder is unremarkable. GI AND BOWEL: Stomach demonstrates no acute abnormality. There is no bowel obstruction. REPRODUCTIVE ORGANS: Large calcification in the central pelvis presumably reflects calcified fibroid. PERITONEUM AND RETROPERITONEUM: Large volume ascites throughout the abdomen and pelvis. No free air. VASCULATURE: Aorta is normal in caliber. Aortic atherosclerosis. ABDOMINAL AND PELVIS LYMPH NODES: No lymphadenopathy. BONES AND SOFT TISSUES: No acute osseous abnormality. No focal soft tissue abnormality. IMPRESSION: 1. Large volume ascites throughout the abdomen and pelvis. 2. Bilateral pleural effusions with compressive lower lobe atelectasis. 3. Mild left hydronephrosis without visible obstructing calculus. Electronically signed by: Franky Crease MD 06/22/2024 08:55 PM EST RP Workstation: [id number]   MR BRAIN WO CONTRAST Result Date: 06/22/2024 EXAM: MRI BRAIN WITHOUT CONTRAST 06/22/2024 03:56:29 PM TECHNIQUE: Multiplanar multisequence MRI of the head/brain was performed without the administration of intravenous contrast. COMPARISON: Same day CT head and MRI head 05/12/2024. CLINICAL HISTORY: Stroke, follow up. FINDINGS: BRAIN AND VENTRICLES: There is a focus of acute infarct within the posterior left  cerebellum involving a portion of the left PICA territory. There are numerous additional scattered areas of infarct involving the cortex and subcortical white matter within the bilateral frontal and parietal lobes. Additional areas of more ill-defined diffusion signal abnormality in the cortex primarily of the right middle frontal gyrus and in the left middle frontal gyrus  without definite restricted diffusion which may reflect areas of subacute infarct. There are scattered and confluent areas of T2 and FLAIR hyperintensity in the periventricular and subcortical white matter with additional areas of signal abnormality in the pons compatible with extensive chronic microvascular ischemic changes. There are remote lacunar infarcts in the bilateral basal ganglia and thalami. Additional areas of remote cortical infarct in the lateral right temporal lobe and in the bilateral occipital lobes. Additional regions of remote infarct and prior petechial hemorrhage in the right frontal lobe. Redemonstrated now remote infarcts in the right cerebellum. No acute intracranial hemorrhage. No mass. No midline shift. No hydrocephalus. The sella is unremarkable. Normal flow voids. ORBITS: No acute abnormality. SINUSES AND MASTOIDS: No acute abnormality. BONES AND SOFT TISSUES: Normal marrow signal. No acute soft tissue abnormality. IMPRESSION: 1. Acute infarct in the posterior left cerebellum involving a portion of the left PICA territory. 2. Numerous additional scattered acute cortical and subcortical infarcts in the bilateral frontal and parietal lobes which are new from prior. 3. Additional bilateral subacute cortical infarcts corresponding to areas of infarct on the prior MRI. 4. Extensive chronic microvascular ischemic changes and remote infarcts as above. Electronically signed by: Donnice Mania MD 06/22/2024 04:20 PM EST RP Workstation: [id number]   CT Head Wo Contrast Result Date: 06/22/2024 EXAM: CT HEAD WITHOUT CONTRAST 06/22/2024 12:33:00 PM TECHNIQUE: CT of the head was performed without the administration of intravenous contrast. Automated exposure control, iterative reconstruction, and/or weight based adjustment of the mA/kV was utilized to reduce the radiation dose to as low as reasonably achievable. COMPARISON: CT head 05/12/2024 CLINICAL HISTORY: Headache, new onset (Age >= 51y)  FINDINGS: BRAIN AND VENTRICLES: No acute hemorrhage. Since October 15, new/interval acute or subacute left cerebellar infarct. Additional remote bilateral cerebellar infarcts. Remote left thalamic lacunar infarct. Remote right posterior temporal infarct. Patchy white matter hypodensities, compatible with chronic microvascular ischemic changes. No hydrocephalus. No extra-axial collection. No mass effect or midline shift. ORBITS: No acute abnormality. SINUSES: No acute abnormality. SOFT TISSUES AND SKULL: No skull fracture. IMPRESSION: 1. Since October 15, new/interval acute or subacute left cerebellar infarct. Recommend MRI head. 2. Additional remote infarcts and chronic microvascular ischemic change. Electronically signed by: Gilmore Molt MD 06/22/2024 12:40 PM EST RP Workstation: [id number]   ECHO TEE Result Date: 06/16/2024    TRANSESOPHOGEAL ECHO REPORT   Patient Name:   OURANIA HAMLER Date of Exam: 06/16/2024 Medical Rec #:  [id number]       Height:       64.0 in Accession #:    [id number]      Weight:       110.1 lb Date of Birth:  1945/08/15      BSA:          1.518 m Patient Age:    77 years        BP:           142/83 mmHg Patient Gender: F               HR:           100 bpm. Exam Location:  Outpatient Procedure: Transesophageal Echo, Cardiac Doppler, Color Doppler and  3D Echo            (Both Spectral and Color Flow Doppler were utilized during            procedure). Indications:     stroke  History:         Patient has prior history of Echocardiogram examinations, most                  recent 05/13/2024. Risk Factors:Hypertension and Dyslipidemia.  Sonographer:     Tinnie Barefoot RDCS Referring Phys:  [id number] MAUDE JAYSON EMMER Diagnosing Phys: Maude Emmer MD PROCEDURE: After discussion of the risks and benefits of a TEE, an informed consent was obtained from the patient. The transesophogeal probe was passed without difficulty through the esophogus of the patient. Imaged were obtained with the  patient in a left lateral decubitus position. Sedation performed by different physician. The patient was monitored while under deep sedation. Anesthestetic sedation was provided intravenously by Anesthesiology: 97mg  of Propofol , 60mg  of Lidocaine . The patient's vital  signs; including heart rate, blood pressure, and oxygen saturation; remained stable throughout the procedure. The patient developed no complications during the procedure.  IMPRESSIONS  1. Left ventricular ejection fraction, by estimation, is 60 to 65%. The left ventricle has normal function. The left ventricle has no regional wall motion abnormalities.  2. Right ventricular systolic function is normal. The right ventricular size is normal.  3. No left atrial/left atrial appendage thrombus was detected.  4. The mitral valve is abnormal. Trivial mitral valve regurgitation. No evidence of mitral stenosis.  5. Nodular calcification of right coronay cusp adjacent to the non cusp commisure. I do not think this represents a papilloma . The aortic valve is normal in structure. There is moderate calcification of the aortic valve. Aortic valve regurgitation is not visualized. Aortic valve sclerosis is present, with no evidence of aortic valve stenosis.  6. The inferior vena cava is normal in size with greater than 50% respiratory variability, suggesting right atrial pressure of 3 mmHg.  7. Agitated saline contrast bubble study was negative, with no evidence of any interatrial shunt.  8. 3D performed of the aortic valve and demonstrates 3D of AV done. Conclusion(s)/Recommendation(s): Normal biventricular function without evidence of hemodynamically significant valvular heart disease. FINDINGS  Left Ventricle: Left ventricular ejection fraction, by estimation, is 60 to 65%. The left ventricle has normal function. The left ventricle has no regional wall motion abnormalities. The left ventricular internal cavity size was normal in size. There is  no left ventricular  hypertrophy. Right Ventricle: The right ventricular size is normal. No increase in right ventricular wall thickness. Right ventricular systolic function is normal. Left Atrium: Left atrial size was normal in size. No left atrial/left atrial appendage thrombus was detected. Right Atrium: Right atrial size was normal in size. Pericardium: There is no evidence of pericardial effusion. Mitral Valve: The mitral valve is abnormal. There is mild thickening of the mitral valve leaflet(s). Trivial mitral valve regurgitation. No evidence of mitral valve stenosis. Tricuspid Valve: The tricuspid valve is normal in structure. Tricuspid valve regurgitation is not demonstrated. No evidence of tricuspid stenosis. Aortic Valve: Nodular calcification of right coronay cusp adjacent to the non cusp commisure. I do not think this represents a papilloma. The aortic valve is normal in structure. There is moderate calcification of the aortic valve. There is moderate aortic valve annular calcification. Aortic valve regurgitation is not visualized. Aortic valve sclerosis is present, with no evidence of aortic valve stenosis. Pulmonic  Valve: The pulmonic valve was normal in structure. Pulmonic valve regurgitation is not visualized. No evidence of pulmonic stenosis. Aorta: The aortic root is normal in size and structure. Venous: The inferior vena cava is normal in size with greater than 50% respiratory variability, suggesting right atrial pressure of 3 mmHg. IAS/Shunts: No atrial level shunt detected by color flow Doppler. Agitated saline contrast was given intravenously to evaluate for intracardiac shunting. Agitated saline contrast bubble study was negative, with no evidence of any interatrial shunt. Additional Comments: 3D was performed not requiring image post processing on an independent workstation and was normal. Maude Emmer MD Electronically signed by Maude Emmer MD Signature Date/Time: 06/16/2024/11:55:06 AM    Final    EP  STUDY Result Date: 06/16/2024 See surgical note for result.   Microbiology: Results for orders placed or performed during the hospital encounter of 06/22/24  Body fluid culture w Gram Stain     Status: None (Preliminary result)   Collection Time: 06/23/24 12:49 PM   Specimen: Abdomen; Peritoneal Fluid  Result Value Ref Range Status   Specimen Description PERITONEAL  Final   Special Requests NONE  Final   Gram Stain   Final    RARE WBC PRESENT, PREDOMINANTLY MONONUCLEAR NO ORGANISMS SEEN    Culture   Final    NO GROWTH < 24 HOURS Performed at [facility], [address] [place], [place] [address]    Report Status PENDING  Incomplete    Labs: CBC: Recent Labs  Lab 06/22/24 1251 06/23/24 0128 06/24/24 0230  WBC 7.5 7.8 7.3  NEUTROABS  --  6.4 5.6  HGB 11.6* 10.4* 10.5*  HCT 35.4* 31.6* 31.3*  MCV 84.3 83.2 83.0  PLT 579* 538* 527*   Basic Metabolic Panel: Recent Labs  Lab 06/22/24 1251 06/23/24 0128 06/24/24 0230  NA 130* 132* 131*  K 4.1 3.7 4.6  CL 95* 97* 96*  CO2 27 26 26   GLUCOSE 100* 102* 101*  BUN 8 8 11   CREATININE 0.59 0.63 0.84  CALCIUM  8.9 8.4* 8.2*  MG  --  1.9 1.9   Liver Function Tests: Recent Labs  Lab 06/22/24 1251 06/24/24 0230  AST 21 17  ALT 14 10  ALKPHOS 81 62  BILITOT 0.5 0.4  PROT 6.3* 5.1*  ALBUMIN  3.1* 2.6*   CBG: No results for input(s): GLUCAP in the last 168 hours.  Discharge time spent: less than 30 minutes.  Signed: Garnette Pelt, MD [facility] 06/24/2024

## 2024-06-24 NOTE — Progress Notes (Signed)
 Transition of Care [facility]) - Inpatient Brief Assessment   Patient Details  Name: Michele Tyler MRN: [id number] Date of Birth: Jan 28, 1946  [facility] CM/SW Contact:    Rosaline JONELLE Joe, RN Phone Number: 06/24/2024, 12:23 PM   Clinical Narrative: Patient admitted with Left periorbital headache.  Patient lives alone but has friends that assist at the home.  Patient plans to return home with home health when stable.  Patient is active with [facility] for RN, PT.  HH orders placed to be co-signed by MD.  DME at the home includes RW, Cane, Rolator.  Patient's friends will provide transportation to home by car.   Transition of Care Asessment: Insurance and Status: Insurance coverage has been reviewed Patient has primary care physician: Yes Home environment has been reviewed: from home Prior level of function:: friends assist Prior/Current Home Services: Current home services (Active with [facility] for RN, PT) Social Drivers of Health Review: SDOH reviewed interventions complete Readmission risk has been reviewed: Yes Transition of care needs: transition of care needs identified, TOC will continue to follow

## 2024-06-24 NOTE — Plan of Care (Signed)
  Problem: Education: Goal: Knowledge of disease or condition will improve 06/24/2024 0531 by Baldwin Doran BIRCH, RN Outcome: Progressing 06/24/2024 0531 by Baldwin Doran BIRCH, RN Outcome: Not Progressing Goal: Knowledge of secondary prevention will improve (MUST DOCUMENT ALL) 06/24/2024 0531 by Baldwin Doran BIRCH, RN Outcome: Progressing 06/24/2024 0531 by Baldwin Doran BIRCH, RN Outcome: Not Progressing Goal: Knowledge of patient specific risk factors will improve (DELETE if not current risk factor) 06/24/2024 0531 by Baldwin Doran BIRCH, RN Outcome: Progressing 06/24/2024 0531 by Baldwin Doran BIRCH, RN Outcome: Not Progressing   Problem: Ischemic Stroke/TIA Tissue Perfusion: Goal: Complications of ischemic stroke/TIA will be minimized 06/24/2024 0531 by Baldwin Doran BIRCH, RN Outcome: Progressing 06/24/2024 0531 by Baldwin Doran BIRCH, RN Outcome: Not Progressing   Problem: Coping: Goal: Will verbalize positive feelings about self 06/24/2024 0531 by Baldwin Doran BIRCH, RN Outcome: Progressing 06/24/2024 0531 by Baldwin Doran BIRCH, RN Outcome: Not Progressing Goal: Will identify appropriate support needs 06/24/2024 0531 by Baldwin Doran BIRCH, RN Outcome: Progressing 06/24/2024 0531 by Baldwin Doran BIRCH, RN Outcome: Not Progressing   Problem: Self-Care: Goal: Ability to participate in self-care as condition permits will improve 06/24/2024 0531 by Baldwin Doran BIRCH, RN Outcome: Progressing 06/24/2024 0531 by Baldwin Doran BIRCH, RN Outcome: Not Progressing Goal: Verbalization of feelings and concerns over difficulty with self-care will improve 06/24/2024 0531 by Baldwin Doran BIRCH, RN Outcome: Progressing 06/24/2024 0531 by Baldwin Doran BIRCH, RN Outcome: Not Progressing Goal: Ability to communicate needs accurately will improve 06/24/2024 0531 by Baldwin Doran BIRCH, RN Outcome: Progressing 06/24/2024 0531 by Baldwin Doran BIRCH, RN Outcome: Not Progressing    Problem: Clinical Measurements: Goal: Diagnostic test results will improve 06/24/2024 0531 by Baldwin Doran BIRCH, RN Outcome: Progressing 06/24/2024 0531 by Baldwin Doran BIRCH, RN Outcome: Not Progressing   Problem: Nutrition: Goal: Adequate nutrition will be maintained 06/24/2024 0531 by Baldwin Doran BIRCH, RN Outcome: Progressing 06/24/2024 0531 by Baldwin Doran D, RN Outcome: Not Progressing   Problem: Coping: Goal: Level of anxiety will decrease 06/24/2024 0531 by Baldwin Doran BIRCH, RN Outcome: Progressing 06/24/2024 0531 by Baldwin Doran BIRCH, RN Outcome: Not Progressing

## 2024-06-24 NOTE — Consult Note (Signed)
 Ophthalmology Consult  Folow up on contact lens overwear left eye.  Pt states left eye feels better.  Eye is now more open.  Still with mild foreign body sensation and mild injection.  On exam left eye vision still 20/40 in the left eye with trace injection left eye.  No discharge and no debris in lashes. Fluorescein  instilled and no stain of cornea or conjunctiva.  A/P 1.Contact lens overwear:  Improving.  CPM with Gatifloxacin  qid and AT's prn.  I will see tomorrow in my office at 9:00 am if patient is discharged today.  If still in hospital I will follow up tomorrow in the hospital.  The pt has my cellphone number and address for exam tomorrow.  Please feel free to contact me if you have any questions or concerns.  Evalene Raw, M.D. (Cell) [phone] (Office) [phone]  [address] [place]

## 2024-06-25 ENCOUNTER — Other Ambulatory Visit: Payer: Self-pay | Admitting: Neurology

## 2024-06-25 DIAGNOSIS — I639 Cerebral infarction, unspecified: Secondary | ICD-10-CM

## 2024-06-26 LAB — BODY FLUID CULTURE W GRAM STAIN: Culture: NO GROWTH

## 2024-06-28 ENCOUNTER — Ambulatory Visit: Payer: Self-pay

## 2024-06-28 NOTE — Telephone Encounter (Signed)
 FYI Only or Action Required?: Action required by provider: request for appointment, clinical question for provider, and update on patient condition.  Patient was last seen in primary care on 06/09/2024 by Michele Toribio POUR, MD.  Called Nurse Triage reporting Joint Swelling.  Symptoms began today.  Interventions attempted: Other: elevating both ankles on pillows.  Symptoms are: stable. Also complains of fatigue, constipation and anxiety.  Triage Disposition: See PCP When Office is Open (Within 3 Days)- scheduled for soonest available HFU with PCP, added to wait list and sending message to clinic for sooner appointment or recommendation  Patient/caregiver understands and will follow disposition?:  Yes           Copied from CRM #[id number]. Topic: Clinical - Red Word Triage >> Jun 28, 2024  3:01 PM Michele Tyler wrote: Red Word that prompted transfer to Nurse Triage: swollen feet, called for an appt. Nothing soon enough  Michele Tyler is Reason for Disposition  [1] MILD swelling of both ankles (e.g., ankle joints look swollen; or bilateral mild pedal edema) AND [2] new-onset or getting worse  (Exceptions: Caused by hot weather, already seen by doctor or NP/PA for this.)  Answer Assessment - Initial Assessment Questions 1. LOCATION: Which ankle is swollen? Where is the swelling?     Bilateral ankles.  2. ONSET: When did the swelling start?     Today.  3. SWELLING: How bad is the swelling? Or, How large is it? (e.g., mild, moderate, severe; size of localized swelling)      Mild, not that bad.  4. PAIN: Is there any pain? If Yes, ask: How bad is it? (Scale 0-10; or none, mild, moderate, severe)     No.  5. CAUSE: What do you think caused the ankle swelling?     Concerned due to swelling in abdomen during recent hospitalization, sodium levels and changes to BP medications as cause.  6. OTHER SYMPTOMS: Do you have any other symptoms? (e.g., fever, chest pain,  difficulty breathing, calf pain)     Fatigue and anxiety, constipation (last BM 07/24/24, taking stool softener and Metamucil) . Denies chest pain, SOB, suicidal ideations.  Friend, Michele, on the phone for triage. She states the patient had a TIA/stroke in October and then recently went to ED on 06/22/24 for eye pain and headache which ended up being a contact that had been left in her eye. She states patient was discharged on Thanksgiving day.  Friend is concerned due to the patient has received conflicting information regarding her olmesartan  (the hospital held it and then discharged her back on it but another doctors note says discontinue). She would like to follow up with Dr Michele regarding the recent hospitalization and also asking about her peritoneal fluid results (she states they were told it was concern for cancer). She also states the patient has only been taking the buspar  once daily instead of three times daily.  Protocols used: Ankle Swelling-A-AH

## 2024-06-29 ENCOUNTER — Telehealth: Payer: Self-pay | Admitting: Neurology

## 2024-06-29 NOTE — Telephone Encounter (Signed)
 Patient was admitted for new stroke 06/22/24, needs to be seen at our office for HFU. I saw her on 06/16/24.

## 2024-06-30 ENCOUNTER — Encounter: Payer: Self-pay | Admitting: Family Medicine

## 2024-06-30 ENCOUNTER — Ambulatory Visit: Admitting: Family Medicine

## 2024-06-30 VITALS — BP 150/84 | HR 108 | Ht 64.0 in | Wt 114.8 lb

## 2024-06-30 DIAGNOSIS — F331 Major depressive disorder, recurrent, moderate: Secondary | ICD-10-CM | POA: Diagnosis not present

## 2024-06-30 DIAGNOSIS — R180 Malignant ascites: Secondary | ICD-10-CM | POA: Diagnosis not present

## 2024-06-30 DIAGNOSIS — K5900 Constipation, unspecified: Secondary | ICD-10-CM | POA: Diagnosis not present

## 2024-06-30 DIAGNOSIS — E222 Syndrome of inappropriate secretion of antidiuretic hormone: Secondary | ICD-10-CM | POA: Diagnosis not present

## 2024-06-30 DIAGNOSIS — R410 Disorientation, unspecified: Secondary | ICD-10-CM | POA: Diagnosis not present

## 2024-06-30 DIAGNOSIS — D649 Anemia, unspecified: Secondary | ICD-10-CM | POA: Diagnosis not present

## 2024-06-30 DIAGNOSIS — E871 Hypo-osmolality and hyponatremia: Secondary | ICD-10-CM

## 2024-06-30 MED ORDER — MIRTAZAPINE 7.5 MG PO TABS: 7.5000 mg | ORAL_TABLET | Freq: Every day | ORAL | 2 refills | Status: DC

## 2024-06-30 MED ORDER — ONDANSETRON HCL 4 MG PO TABS: 4.0000 mg | ORAL_TABLET | Freq: Three times a day (TID) | ORAL | 1 refills | Status: DC | PRN

## 2024-06-30 MED ORDER — SPIRONOLACTONE 100 MG PO TABS: 100.0000 mg | ORAL_TABLET | Freq: Every day | ORAL | 1 refills | Status: DC

## 2024-06-30 NOTE — Patient Instructions (Signed)
 It was nice to see you today,  We addressed the following topics today: - I am stopping your olmesartan  and starting you on a medication called spironolactone at a dose of 100 milligrams. Please take this once a day, preferably in the morning as it will make you urinate more. Let us  know if you feel dizzy or if your blood pressure gets too low. This is the safest option to help with the swelling without making your sodium level worse. - For your constipation, you can take more Miralax . You can take a capful two or three times a day. You can also try an over-the-counter stimulant laxative like Senna. - I am sending a referral to a kidney specialist (nephrologist) to help manage your low sodium levels. - I have sent in mirtazapine to take for anxiety - We are still waiting for the results of the fluid analysis (cytology). I will let you know the results and our plan once they are back. - Please continue to check your blood pressure at home. - You have a follow-up appointment scheduled in January, which is fine for now. I will contact you if I need to see you sooner.  Have a great day,  Rolan Slain, MD

## 2024-06-30 NOTE — Progress Notes (Unsigned)
 Established Patient Office Visit  Subjective   Patient ID: Michele Tyler, female    DOB: November 16, 1945  Age: 78 y.o. MRN: [id number]  Chief Complaint  Patient presents with   Hospitalization Follow-up    HPI  Subjective - Reports multiple hospitalizations since last seen. Most recent visit was for an eye issue. - Reports significant generalized swelling since Monday, worsening over time. Reports left calf was very swollen last night. Swelling is now bilateral but was initially in the right leg. Leg swelling developed after the last hospitalization. - No bowel movement since hospital discharge last Thursday despite taking stool softeners and Miralax . Reports eating normally. Feeling distended, initially thought to be constipation. - Increased confusion reported by caregiver. More worried. Difficulty using phone, TV remote, coffee maker, and microwave. Disoriented to time of day (day vs. night). Confusion worsened after last hospitalization. Not combative. Difficulty with prioritization. Forgets to refrigerate perishable items like milk. Mixes up left and right directions. - Reports walking up and down the driveway with caregiver helps. - Reports not liking the effects of bupropion  (Wellbutrin ) when previously taken. - Remembers trying fluoxetine (Prozac) in the past, but it caused insomnia when taken at night.  Medications Current prescriptions include olmesartan  (half tablet), buspirone  (Buspar ), Plavix , aspirin , and atorvastatin  (Lipitor). Currently taking buspirone  twice a day instead of three times. Was on hydrochlorothiazide, which was stopped. Was taken off spironolactone  in the hospital but told to resume a half dose (20 mg) upon discharge. Over-the-counter medications include stool softeners and Miralax . Has taken Dulcolax and used a Fleets glycerin suppository.  PMH, PSH, FH, Social Hx PMHx: Recent strokes (new spots found on imaging), hyponatremia (recent level 125 off HCTZ),  ascites (diagnosed on recent CT scan), plural effusions, edema. History of anxiety. Has tried Zoloft, Celexa, Lexapro, Wellbutrin , and Prozac in the past. PSH: Paracentesis last Wednesday. Social Hx: Lives alone but daughter lives nearby and is with her daily, sometimes staying overnight. Daughter is arranging for professional caregivers to provide shift-based care. Daughter assists with medications. Home health is set up (nursing, PT, OT, speech therapy pending). Has grab bars and a shower chair at home. Caregiver is looking into a medical alert device.  ROS Constitutional: Generalized edema. GI: Abdominal distension. Constipation, no bowel movement for approximately 1 week. Neuro: Increased confusion, memory issues, disorientation to time. No new focal deficits. Psych: Reports increased worry.  The ASCVD Risk score (Arnett DK, et al., 2019) failed to calculate for the following reasons:   Risk score cannot be calculated because patient has a medical history suggesting prior/existing ASCVD   * - Cholesterol units were assumed  Health Maintenance Due  Topic Date Due   Hepatitis C Screening  Never done   Zoster Vaccines- Shingrix (1 of 2) Never done   Pneumococcal Vaccine: 50+ Years (2 of 2 - PCV) 09/07/2014   Medicare Annual Wellness (AWV)  01/09/2024   Influenza Vaccine  02/27/2024   COVID-19 Vaccine (6 - 2025-26 season) 03/29/2024      Objective:     BP (!) 150/84   Pulse (!) 108   Ht 5' 4 (1.626 m)   Wt 114 lb 12.8 oz (52.1 kg)   SpO2 97%   BMI 19.71 kg/m    Physical Exam General: ambulating slowly with walker.  Abdomen: Distended, tight. Extremities: Significant pitting edema in bilateral lower extremities. Left calf noted to be very swollen. No evidence of DVT on recent ultrasound. Lungs: Clear to auscultation. Psych: some confusion, noted when trying to  ambulate back to her room from the bathroom and having difficulty navigating left/right directions       Assessment & Plan:   Malignant ascites (HCC) Assessment & Plan: Presents with significant swelling, including new bilateral lower extremity edema, abdominal distension from ascites, and known pleural effusions. This is in the setting of recent hospitalization and new diagnosis of ascites via CT. Paracentesis was performed last week. Cytology from fluid is pending. Concern it is due to underlying malignancy given her other recent lab results. Other common causes such as end-stage liver disease or renal disease are less likely given normal LFTs and kidney function. - Stop olmesartan . - Start spironolactone  100 mg daily for edema and ascites. - Awaiting cytology results to guide further workup for malignancy. - Referral to Nephrology for management of SIADH, especially if sodium drops again.   Hyponatremia -     Comprehensive metabolic panel with GFR -     Osmolality -     Cortisol  Anemia, unspecified type -     Iron, TIBC and Ferritin Panel -     CBC with Differential/Platelet  SIADH (syndrome of inappropriate ADH production) Assessment & Plan:  History of low sodium, which improved slightly after stopping hydrochlorothiazide but then dropped to 125 prior to recent hospitalization. Current labs ordered to recheck sodium, osmolality, and uric acid. This is likely SIADH, which can be caused by strokes or malignancy. - Labs drawn today to recheck sodium, osmolality, uric acid. - Will monitor labs and await cytology. - Referral to Nephrology for possible specific medical management if sodium remains low.   MDD (major depressive disorder), recurrent episode, moderate (HCC) Assessment & Plan: Will start mirtazapine .     Constipation, unspecified constipation type Assessment & Plan: No bowel movement for approximately one week despite OTC stool softeners and Miralax . - Increase Miralax , up to two or three capfuls per day, each in 4-6 oz of water. - Advised to try Senna (Senokot). -  Glycerin suppositories are safe to use. - Counseled on risk of electrolyte abnormalities with excessive laxative use.   Confusion Assessment & Plan: Worsening confusion and anxiety post-hospitalization. Reports difficulty with ADLs and IADLs. Has tried multiple antidepressants in the past with limited success or side effects (Zoloft, Celexa, Lexapro, Wellbutrin , Prozac). Currently on buspirone  PRN. Walking has been helpful. - Consider starting mirtazapine , will confirm safety in setting of hyponatremia. - Continue to encourage physical activity like walking. - Connect with neurology for an earlier follow-up appointment.   Other orders -     Spironolactone ; Take 1 tablet (100 mg total) by mouth daily.  Dispense: 90 tablet; Refill: 1 -     Mirtazapine ; Take 1 tablet (7.5 mg total) by mouth at bedtime.  Dispense: 30 tablet; Refill: 2 -     Ondansetron  HCl; Take 1 tablet (4 mg total) by mouth every 8 (eight) hours as needed for nausea or vomiting.  Dispense: 20 tablet; Refill: 1     No follow-ups on file.    Toribio MARLA Slain, MD

## 2024-07-01 LAB — COMPREHENSIVE METABOLIC PANEL WITH GFR
ALT: 13 IU/L (ref 0–32)
AST: 19 IU/L (ref 0–40)
Albumin: 3.7 g/dL — ABNORMAL LOW (ref 3.8–4.8)
Alkaline Phosphatase: 96 IU/L (ref 49–135)
BUN/Creatinine Ratio: 17 (ref 12–28)
BUN: 12 mg/dL (ref 8–27)
Bilirubin Total: <0.2 mg/dL (ref 0.0–1.2)
CO2: 24 mmol/L (ref 20–29)
Calcium: 9.3 mg/dL (ref 8.7–10.3)
Chloride: 94 mmol/L — ABNORMAL LOW (ref 96–106)
Creatinine, Ser: 0.7 mg/dL (ref 0.57–1.00)
Globulin, Total: 2.3 g/dL (ref 1.5–4.5)
Glucose: 123 mg/dL — ABNORMAL HIGH (ref 70–99)
Potassium: 4.6 mmol/L (ref 3.5–5.2)
Sodium: 133 mmol/L — ABNORMAL LOW (ref 134–144)
Total Protein: 6 g/dL (ref 6.0–8.5)
eGFR: 90 mL/min/1.73 (ref >59)

## 2024-07-01 LAB — CBC WITH DIFFERENTIAL/PLATELET
Basophils Absolute: 0 x10E3/uL (ref 0.0–0.2)
Basos: 0 %
EOS (ABSOLUTE): 0 x10E3/uL (ref 0.0–0.4)
Eos: 0 %
Hematocrit: 35.6 % (ref 34.0–46.6)
Hemoglobin: 11.4 g/dL (ref 11.1–15.9)
Immature Grans (Abs): 0 x10E3/uL (ref 0.0–0.1)
Immature Granulocytes: 0 %
Lymphocytes Absolute: 0.5 x10E3/uL — ABNORMAL LOW (ref 0.7–3.1)
Lymphs: 7 %
MCH: 28.1 pg (ref 26.6–33.0)
MCHC: 32 g/dL (ref 31.5–35.7)
MCV: 88 fL (ref 79–97)
Monocytes Absolute: 0.6 x10E3/uL (ref 0.1–0.9)
Monocytes: 9 %
Neutrophils Absolute: 6.1 x10E3/uL (ref 1.4–7.0)
Neutrophils: 84 %
Platelets: 735 x10E3/uL — ABNORMAL HIGH (ref 150–450)
RBC: 4.05 x10E6/uL (ref 3.77–5.28)
RDW: 12.6 % (ref 11.7–15.4)
WBC: 7.3 x10E3/uL (ref 3.4–10.8)

## 2024-07-01 LAB — IRON,TIBC AND FERRITIN PANEL
Ferritin: 302 ng/mL — ABNORMAL HIGH (ref 15–150)
Iron Saturation: 11 % — ABNORMAL LOW (ref 15–55)
Iron: 27 ug/dL (ref 27–139)
Total Iron Binding Capacity: 238 ug/dL — ABNORMAL LOW (ref 250–450)
UIBC: 211 ug/dL (ref 118–369)

## 2024-07-01 LAB — OSMOLALITY: Osmolality Meas: 271 mosm/kg — ABNORMAL LOW (ref 280–301)

## 2024-07-01 LAB — CORTISOL: Cortisol: 21.4 ug/dL — ABNORMAL HIGH (ref 6.2–19.4)

## 2024-07-05 ENCOUNTER — Telehealth: Payer: Self-pay

## 2024-07-05 NOTE — Telephone Encounter (Signed)
-----   Message from Toribio MARLA Slain sent at 07/05/2024  7:56 AM EST ----- Can you call [facility] and see if they are still processing the cytology order from the peritoneal fluid collected in the hospital?  All the other labs have returned from that sample.

## 2024-07-05 NOTE — Telephone Encounter (Signed)
 Called [facility] they stated that they do not see anything on there end for that cytology order

## 2024-07-06 ENCOUNTER — Other Ambulatory Visit: Payer: Self-pay | Admitting: Family Medicine

## 2024-07-06 ENCOUNTER — Ambulatory Visit: Payer: Self-pay | Admitting: Family Medicine

## 2024-07-06 ENCOUNTER — Telehealth: Payer: Self-pay

## 2024-07-06 ENCOUNTER — Inpatient Hospital Stay: Admitting: Family Medicine

## 2024-07-06 DIAGNOSIS — I69393 Ataxia following cerebral infarction: Secondary | ICD-10-CM | POA: Diagnosis not present

## 2024-07-06 DIAGNOSIS — E871 Hypo-osmolality and hyponatremia: Secondary | ICD-10-CM | POA: Diagnosis not present

## 2024-07-06 DIAGNOSIS — Z7902 Long term (current) use of antithrombotics/antiplatelets: Secondary | ICD-10-CM | POA: Diagnosis not present

## 2024-07-06 DIAGNOSIS — F32A Depression, unspecified: Secondary | ICD-10-CM | POA: Diagnosis not present

## 2024-07-06 DIAGNOSIS — E785 Hyperlipidemia, unspecified: Secondary | ICD-10-CM | POA: Diagnosis not present

## 2024-07-06 DIAGNOSIS — I10 Essential (primary) hypertension: Secondary | ICD-10-CM | POA: Diagnosis not present

## 2024-07-06 DIAGNOSIS — D509 Iron deficiency anemia, unspecified: Secondary | ICD-10-CM | POA: Diagnosis not present

## 2024-07-06 DIAGNOSIS — R180 Malignant ascites: Secondary | ICD-10-CM

## 2024-07-06 DIAGNOSIS — J90 Pleural effusion, not elsewhere classified: Secondary | ICD-10-CM | POA: Diagnosis not present

## 2024-07-06 DIAGNOSIS — R32 Unspecified urinary incontinence: Secondary | ICD-10-CM | POA: Diagnosis not present

## 2024-07-06 LAB — CYTOLOGY - NON PAP

## 2024-07-06 MED ORDER — PROMETHAZINE HCL 25 MG PO TABS: 25.0000 mg | ORAL_TABLET | Freq: Four times a day (QID) | ORAL | 2 refills | Status: AC | PRN

## 2024-07-06 MED ORDER — LACTULOSE 10 GM/15ML PO SOLN: 10.0000 g | Freq: Two times a day (BID) | ORAL | 2 refills | Status: DC | PRN

## 2024-07-06 MED ORDER — OXYCODONE HCL 5 MG PO TABS: 5.0000 mg | ORAL_TABLET | ORAL | 0 refills | Status: DC | PRN

## 2024-07-06 NOTE — Telephone Encounter (Signed)
 Copied from CRM #[id number]. Topic: Clinical - Lab/Test Results >> Jul 06, 2024 12:47 PM Sophia H wrote: Reason for CRM: Caregiver April - Returning missed call. States no voicemail was left so unsure if it was regarding cytology results. Please reach out, thank you. April # [phone] - states patient has locked herself out of her phone.

## 2024-07-08 ENCOUNTER — Telehealth: Payer: Self-pay | Admitting: Oncology

## 2024-07-08 ENCOUNTER — Encounter: Payer: Self-pay | Admitting: Hematology and Oncology

## 2024-07-08 DIAGNOSIS — E222 Syndrome of inappropriate secretion of antidiuretic hormone: Secondary | ICD-10-CM | POA: Insufficient documentation

## 2024-07-08 DIAGNOSIS — R410 Disorientation, unspecified: Secondary | ICD-10-CM | POA: Insufficient documentation

## 2024-07-08 DIAGNOSIS — R188 Other ascites: Secondary | ICD-10-CM | POA: Insufficient documentation

## 2024-07-08 NOTE — Assessment & Plan Note (Signed)
 Will start mirtazapine.

## 2024-07-08 NOTE — Telephone Encounter (Signed)
 Patient's friend and caregiver, April, called back and confirmed appointment with Dr. Lonn tomorrow at 2:00 with arrival to the cancer center at 1:30 to check in.

## 2024-07-08 NOTE — Assessment & Plan Note (Signed)
 History of low sodium, which improved slightly after stopping hydrochlorothiazide but then dropped to 125 prior to recent hospitalization. Current labs ordered to recheck sodium, osmolality, and uric acid. This is likely SIADH, which can be caused by strokes or malignancy. - Labs drawn today to recheck sodium, osmolality, uric acid. - Will monitor labs and await cytology. - Referral to Nephrology for possible specific medical management if sodium remains low.

## 2024-07-08 NOTE — Assessment & Plan Note (Signed)
 Worsening confusion and anxiety post-hospitalization. Reports difficulty with ADLs and IADLs. Has tried multiple antidepressants in the past with limited success or side effects (Zoloft, Celexa, Lexapro, Wellbutrin , Prozac). Currently on buspirone  PRN. Walking has been helpful. - Consider starting mirtazapine , will confirm safety in setting of hyponatremia. - Continue to encourage physical activity like walking. - Connect with neurology for an earlier follow-up appointment.

## 2024-07-08 NOTE — Telephone Encounter (Signed)
 Left a message with appointment to see Dr. Lonn tomorrow at 2:00.  Requested a return call.

## 2024-07-08 NOTE — Assessment & Plan Note (Signed)
 No bowel movement for approximately one week despite OTC stool softeners and Miralax . - Increase Miralax , up to two or three capfuls per day, each in 4-6 oz of water. - Advised to try Senna (Senokot). - Glycerin suppositories are safe to use. - Counseled on risk of electrolyte abnormalities with excessive laxative use.

## 2024-07-08 NOTE — Assessment & Plan Note (Signed)
 Presents with significant swelling, including new bilateral lower extremity edema, abdominal distension from ascites, and known pleural effusions. This is in the setting of recent hospitalization and new diagnosis of ascites via CT. Paracentesis was performed last week. Cytology from fluid is pending. Concern it is due to underlying malignancy given her other recent lab results. Other common causes such as end-stage liver disease or renal disease are less likely given normal LFTs and kidney function. - Stop olmesartan . - Start spironolactone  100 mg daily for edema and ascites. - Awaiting cytology results to guide further workup for malignancy. - Referral to Nephrology for management of SIADH, especially if sodium drops again.

## 2024-07-09 ENCOUNTER — Encounter: Payer: Self-pay | Admitting: Hematology and Oncology

## 2024-07-09 ENCOUNTER — Telehealth: Payer: Self-pay

## 2024-07-09 ENCOUNTER — Inpatient Hospital Stay: Attending: Hematology and Oncology | Admitting: Hematology and Oncology

## 2024-07-09 VITALS — BP 152/81 | HR 104 | Temp 98.0°F | Resp 18 | Ht 64.0 in | Wt 104.2 lb

## 2024-07-09 DIAGNOSIS — R971 Elevated cancer antigen 125 [CA 125]: Secondary | ICD-10-CM | POA: Insufficient documentation

## 2024-07-09 DIAGNOSIS — Z79899 Other long term (current) drug therapy: Secondary | ICD-10-CM | POA: Diagnosis not present

## 2024-07-09 DIAGNOSIS — R180 Malignant ascites: Secondary | ICD-10-CM

## 2024-07-09 DIAGNOSIS — Z5111 Encounter for antineoplastic chemotherapy: Secondary | ICD-10-CM | POA: Diagnosis present

## 2024-07-09 DIAGNOSIS — K5900 Constipation, unspecified: Secondary | ICD-10-CM | POA: Diagnosis not present

## 2024-07-09 DIAGNOSIS — Z7902 Long term (current) use of antithrombotics/antiplatelets: Secondary | ICD-10-CM | POA: Diagnosis not present

## 2024-07-09 DIAGNOSIS — Z87891 Personal history of nicotine dependence: Secondary | ICD-10-CM | POA: Insufficient documentation

## 2024-07-09 DIAGNOSIS — C569 Malignant neoplasm of unspecified ovary: Secondary | ICD-10-CM

## 2024-07-09 DIAGNOSIS — J90 Pleural effusion, not elsewhere classified: Secondary | ICD-10-CM | POA: Insufficient documentation

## 2024-07-09 DIAGNOSIS — R978 Other abnormal tumor markers: Secondary | ICD-10-CM | POA: Diagnosis not present

## 2024-07-09 DIAGNOSIS — Z7982 Long term (current) use of aspirin: Secondary | ICD-10-CM | POA: Diagnosis not present

## 2024-07-09 DIAGNOSIS — C799 Secondary malignant neoplasm of unspecified site: Secondary | ICD-10-CM | POA: Insufficient documentation

## 2024-07-09 MED ORDER — ONDANSETRON HCL 8 MG PO TABS: 8.0000 mg | ORAL_TABLET | Freq: Three times a day (TID) | ORAL | 1 refills | Status: DC | PRN

## 2024-07-09 MED ORDER — DEXAMETHASONE 4 MG PO TABS: ORAL_TABLET | ORAL | 6 refills | Status: AC

## 2024-07-09 MED ORDER — PROCHLORPERAZINE MALEATE 10 MG PO TABS: 10.0000 mg | ORAL_TABLET | Freq: Four times a day (QID) | ORAL | 1 refills | Status: AC | PRN

## 2024-07-09 MED ORDER — LIDOCAINE-PRILOCAINE 2.5-2.5 % EX CREA: TOPICAL_CREAM | CUTANEOUS | 3 refills | Status: AC

## 2024-07-09 NOTE — Assessment & Plan Note (Signed)
She is not symptomatic She does not need thoracentesis 

## 2024-07-09 NOTE — Assessment & Plan Note (Signed)
 She has chronic constipation since her diagnosis of stroke I recommend aggressive laxative therapy

## 2024-07-09 NOTE — Assessment & Plan Note (Signed)
 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 The intent is of curative intent.  We discussed some of the risks, benefits, side-effects of carboplatin & Taxol. Treatment is intravenous, every 3 weeks x 3 cycles followed by interval imaging study and possible future debulking surgery, followed by more adjuvant treatment  Some of the short term side-effects included, though not limited to, including weight loss, life threatening infections, risk of allergic reactions, need for transfusions of blood products, nausea, vomiting, change in bowel habits, loss of hair, admission to hospital for various reasons, and risks of death.   Long term side-effects are also discussed including risks of infertility, permanent damage to nerve function, hearing loss, chronic fatigue, kidney damage with possibility needing hemodialysis, and rare secondary malignancy including bone marrow disorders.  The patient is aware that the response rates discussed earlier is not guaranteed.  After a long discussion, patient made an informed decision to proceed with the prescribed plan of care.   Patient education material was dispensed. We discussed premedication with dexamethasone before chemotherapy. I recommend port placement and chemo education class She will start treatment as soon as possible I will see her next month prior to cycle 2 of treatment

## 2024-07-09 NOTE — Telephone Encounter (Signed)
 Called and left a message for caregiver, April asking her to call the office back. IR will call her Monday to give details for port on 12/16 and lab appt has been added on 12/9 at 0945 prior to seeing Dr. Viktoria.

## 2024-07-09 NOTE — Assessment & Plan Note (Signed)
 She has persistent ascites on exam I recommend therapeutic paracentesis next week

## 2024-07-09 NOTE — Telephone Encounter (Signed)
 Copied from CRM #[id number]. Topic: Clinical - Home Health Verbal Orders >> Jul 09, 2024  8:47 AM Viola FALCON wrote: Caller/Agency: Kristine from [facility]  Callback Number: [phone] Service Requested: Speech Therapy Frequency: 1x a week for 8 weeks  Any new concerns about the patient? No   [facility]ed [facility] verbal orders were given 07/09/2024

## 2024-07-09 NOTE — Progress Notes (Signed)
 [name] CONSULT NOTE  Patient Care Team: [name], MD as PCP - General (Family Medicine) Ren Ny, Joelle DEL, MD as PCP - Cardiology (Cardiology) Caleen Dirks, MD as Consulting Physician (Internal Medicine)  ASSESSMENT & PLAN:  Ovarian cancer [facility]) The patient developed acute stroke in October 2025 with no definitive cause of stroke found Subsequently, she was found to have abnormal CT imaging leading to therapeutic paracentesis Fluid cytology came back positive for malignancy I  reviewed CT imaging with the patient and her caregiver and gave her a copy of recent pathology I suspect the patient might have stage III/IV ovarian cancer We discussed neoadjuvant chemotherapy approach  We reviewed the NCCN guidelines We discussed the role of chemotherapy. The intent is of curative intent.  We discussed some of the risks, benefits, side-effects of carboplatin & Taxol. Treatment is intravenous, every 3 weeks x 3 cycles followed by interval imaging study and possible future debulking surgery, followed by more adjuvant treatment  Some of the short term side-effects included, though not limited to, including weight loss, life threatening infections, risk of allergic reactions, need for transfusions of blood products, nausea, vomiting, change in bowel habits, loss of hair, admission to hospital for various reasons, and risks of death.   Long term side-effects are also discussed including risks of infertility, permanent damage to nerve function, hearing loss, chronic fatigue, kidney damage with possibility needing hemodialysis, and rare secondary malignancy including bone marrow disorders.  The patient is aware that the response rates discussed earlier is not guaranteed.  After a long discussion, patient made an informed decision to proceed with the prescribed plan of care.   Patient education material was dispensed. We discussed premedication with dexamethasone before  chemotherapy. I recommend port placement and chemo education class She will start treatment as soon as possible I will see her next month prior to cycle 2 of treatment   Ascites She has persistent ascites on exam I recommend therapeutic paracentesis next week  Constipation She has chronic constipation since her diagnosis of stroke I recommend aggressive laxative therapy  Bilateral pleural effusion She is not symptomatic She does not need thoracentesis  Orders Placed This Encounter  Procedures   IR IMAGING GUIDED PORT INSERTION    Standing Status:   Future    Expected Date:   07/16/2024    Expiration Date:   07/09/2025    Reason for Exam (SYMPTOM  OR DIAGNOSIS REQUIRED):   need port for chemo after christmas    Preferred Imaging Location?:   [facility]   US  Paracentesis    Standing Status:   Future    Expected Date:   07/12/2024    Expiration Date:   07/09/2025    If therapeutic, is there a maximum amount of fluid to be removed?:   Yes    What is the maximum amount of fluide to be removed?:   5 liters    Are labs required for specimen collection?:   No    Is Albumin  medication needed?:   No    Reason for Exam (SYMPTOM  OR DIAGNOSIS REQUIRED):   malignant ascites    Preferred imaging location?:   [facility]   CBC with Differential (Cancer Center Only)    Standing Status:   Future    Expected Date:   07/19/2024    Expiration Date:   07/19/2025   CMP (Cancer Center only)    Standing Status:   Future    Expected  Date:   07/19/2024    Expiration Date:   07/19/2025   CBC with Differential (Cancer Center Only)    Standing Status:   Future    Expected Date:   08/09/2024    Expiration Date:   08/09/2025   CMP (Cancer Center only)    Standing Status:   Future    Expected Date:   08/09/2024    Expiration Date:   08/09/2025   CBC with Differential (Cancer Center Only)    Standing Status:   Future    Expected Date:   08/30/2024    Expiration Date:   08/30/2025    CMP (Cancer Center only)    Standing Status:   Future    Expected Date:   08/30/2024    Expiration Date:   08/30/2025   CBC with Differential (Cancer Center Only)    Standing Status:   Future    Expected Date:   09/20/2024    Expiration Date:   09/20/2025   CMP (Cancer Center only)    Standing Status:   Future    Expected Date:   09/20/2024    Expiration Date:   09/20/2025   CBC with Differential (Cancer Center Only)    Standing Status:   Future    Expected Date:   10/11/2024    Expiration Date:   10/11/2025   CMP (Cancer Center only)    Standing Status:   Future    Expected Date:   10/11/2024    Expiration Date:   10/11/2025   CBC with Differential (Cancer Center Only)    Standing Status:   Future    Expected Date:   11/01/2024    Expiration Date:   11/01/2025   CMP (Cancer Center only)    Standing Status:   Future    Expected Date:   11/01/2024    Expiration Date:   11/01/2025   CA 125    Standing Status:   Standing    Number of Occurrences:   11    Expiration Date:   07/09/2025    The total time spent in the appointment was 80 minutes encounter with patients including review of chart and various tests results, discussions about plan of care and coordination of care plan   All questions were answered. The patient knows to call the clinic with any problems, questions or concerns. No barriers to learning was detected.  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 female is here because of recent finding of malignancy She is here accompanied by her caregiver The patient was previously healthy but then developed dizziness and neurological deficit She was subsequently admitted in October and was diagnosed with stroke She had extensive evaluation with no cause found In November, she was readmitted again due to symptoms of  headache CT imaging showed abdominal ascites, bilateral pleural effusion of unknown etiology She underwent paracentesis and malignant cytology is found She is referred here for management  From a neurological perspective, she has recovered nicely without permanent neurological deficit although she had difficulties with execution of complex tasks.  She denies memory issues.  She is somewhat weak and uses a walker to walk.  She has lost a lot of weight.  She has developed chronic constipation  I have reviewed her chart and materials related to her cancer extensively and collaborated history with the patient. Summary of  oncologic history is as follows: Oncology History  Ovarian cancer (HCC)  06/22/2024 Imaging   VAS US  LOWER EXTREMITY VENOUS (DVT) Result Date: 06/25/2024  Lower Venous DVT Study Patient Name:  ANASTASIA TOMPSON  Date of Exam:   06/24/2024 Medical Rec #: [id number]        Accession #:    [id number] Date of Birth: March 08, 1946       Patient Gender: F Patient Age:   89 years Exam Location:  [facility] Procedure:      VAS US  LOWER EXTREMITY VENOUS (DVT) Referring Phys: ARY XU --------------------------------------------------------------------------------  Indications: Stroke.  Comparison Study: Previous exam on 05/13/2024 was negative for DVT Performing Technologist: Ezzie Potters RVT, RDMS  Examination Guidelines: A complete evaluation includes B-mode imaging, spectral Doppler, color Doppler, and power Doppler as needed of all accessible portions of each vessel. Bilateral testing is considered an integral part of a complete examination. Limited examinations for reoccurring indications may be performed as noted. The reflux portion of the exam is performed with the patient in reverse Trendelenburg.  +---------+---------------+---------+-----------+----------+--------------+ RIGHT    CompressibilityPhasicitySpontaneityPropertiesThrombus Aging  +---------+---------------+---------+-----------+----------+--------------+ CFV      Full           Yes      Yes                                 +---------+---------------+---------+-----------+----------+--------------+ SFJ      Full                                                        +---------+---------------+---------+-----------+----------+--------------+ FV Prox  Full           Yes      Yes                                 +---------+---------------+---------+-----------+----------+--------------+ FV Mid   Full           Yes      Yes                                 +---------+---------------+---------+-----------+----------+--------------+ FV DistalFull           Yes      Yes                                 +---------+---------------+---------+-----------+----------+--------------+ PFV      Full                                                        +---------+---------------+---------+-----------+----------+--------------+ POP      Full           Yes      Yes                                 +---------+---------------+---------+-----------+----------+--------------+ PTV      Full                                                        +---------+---------------+---------+-----------+----------+--------------+  PERO     Full                                                        +---------+---------------+---------+-----------+----------+--------------+   +---------+---------------+---------+-----------+----------+--------------+ LEFT     CompressibilityPhasicitySpontaneityPropertiesThrombus Aging +---------+---------------+---------+-----------+----------+--------------+ CFV      Full           Yes      Yes                                 +---------+---------------+---------+-----------+----------+--------------+ SFJ      Full                                                         +---------+---------------+---------+-----------+----------+--------------+ FV Prox  Full           Yes      Yes                                 +---------+---------------+---------+-----------+----------+--------------+ FV Mid   Full           Yes      Yes                                 +---------+---------------+---------+-----------+----------+--------------+ FV DistalFull           Yes      Yes                                 +---------+---------------+---------+-----------+----------+--------------+ PFV      Full                                                        +---------+---------------+---------+-----------+----------+--------------+ POP      Full           Yes      Yes                                 +---------+---------------+---------+-----------+----------+--------------+ PTV      Full                                                        +---------+---------------+---------+-----------+----------+--------------+ PERO     Full                                                        +---------+---------------+---------+-----------+----------+--------------+  Summary: BILATERAL: - No evidence of deep vein thrombosis seen in the lower extremities, bilaterally. -No evidence of popliteal cyst, bilaterally. RIGHT: Subcutaneous edema of calf.   *See table(s) above for measurements and observations. Electronically signed by Norman Serve on 06/25/2024 at 7:15:05 AM.    Final    IR Paracentesis Result Date: 06/23/2024 INDICATION: Patient in the hospital with headache, left eye infection with discharge, acute brain infarct. CT chest abdomen pelvis obtained in general workup, patient found to have ascites. Request for diagnostic and therapeutic paracentesis. EXAM: ULTRASOUND GUIDED DIAGNOSTIC AND THERAPEUTIC PARACENTESIS MEDICATIONS: 8 mL 1% lidocaine  COMPLICATIONS: None immediate. PROCEDURE: Informed written consent was obtained from the patient after a  discussion of the risks, benefits and alternatives to treatment. A timeout was performed prior to the initiation of the procedure. Initial ultrasound scanning demonstrates a small amount of ascites within the right lower abdominal quadrant. The right lower abdomen was prepped and draped in the usual sterile fashion. 1% lidocaine  was used for local anesthesia. Following this, a 19 gauge, 7-cm, Yueh catheter was introduced. An ultrasound image was saved for documentation purposes. The paracentesis was performed. The catheter was removed and a dressing was applied. The patient tolerated the procedure well without immediate post procedural complication. FINDINGS: A total of approximately 3 liters of straw colored fluid was removed. Samples were sent to the laboratory as requested by the clinical team. IMPRESSION: Successful ultrasound-guided paracentesis yielding 3 liters of peritoneal fluid. Performed by: Wyatt Pommier, PA-C Electronically Signed   By: Juliene Balder M.D.   On: 06/23/2024 17:13   CT CHEST ABDOMEN PELVIS W CONTRAST Result Date: 06/22/2024 EXAM: CT CHEST, ABDOMEN AND PELVIS WITH CONTRAST 06/22/2024 08:22:00 PM TECHNIQUE: CT of the chest, abdomen and pelvis was performed with the administration of intravenous contrast (50 mL iohexol  (OMNIPAQUE ) 350 MG/ML injection). Multiplanar reformatted images are provided for review. Automated exposure control, iterative reconstruction, and/or weight based adjustment of the mA/kV was utilized to reduce the radiation dose to as low as reasonably achievable. COMPARISON: None available. CLINICAL HISTORY: r/o malignancy. FINDINGS: CHEST: MEDIASTINUM AND LYMPH NODES: Heart and pericardium are unremarkable. The central airways are clear. No mediastinal, hilar or axillary lymphadenopathy. Scattered coronary artery and aortic atherosclerosis. LUNGS AND PLEURA: Bilateral pleural effusions. Compressive atelectasis in the lower lobes bilaterally. No focal consolidation or  pulmonary edema. No pneumothorax. ABDOMEN AND PELVIS: LIVER: Scattered cystic areas in the liver, the largest in the posterior right hepatic lobe measuring 1.7 cm. These appear benign. GALLBLADDER AND BILE DUCTS: Gallbladder is unremarkable. No biliary ductal dilatation. SPLEEN: No acute abnormality. PANCREAS: No acute abnormality. ADRENAL GLANDS: No suspicious adrenal abnormality. KIDNEYS, URETERS AND BLADDER: Mild left hydronephrosis. The left ureter is difficult to follow, but no obstructing stones seen. 1.3 cm simple appearing cyst in the mid pole of the left kidney. No suspicious renal abnormality. No stones in the right kidney. No perinephric or periureteral stranding. Urinary bladder is unremarkable. GI AND BOWEL: Stomach demonstrates no acute abnormality. There is no bowel obstruction. REPRODUCTIVE ORGANS: Large calcification in the central pelvis presumably reflects calcified fibroid. PERITONEUM AND RETROPERITONEUM: Large volume ascites throughout the abdomen and pelvis. No free air. VASCULATURE: Aorta is normal in caliber. Aortic atherosclerosis. ABDOMINAL AND PELVIS LYMPH NODES: No lymphadenopathy. BONES AND SOFT TISSUES: No acute osseous abnormality. No focal soft tissue abnormality. IMPRESSION: 1. Large volume ascites throughout the abdomen and pelvis. 2. Bilateral pleural effusions with compressive lower lobe atelectasis. 3. Mild left hydronephrosis without visible obstructing calculus. Electronically signed by:  Franky Crease MD 06/22/2024 08:55 PM EST RP Workstation: [id number]   MR BRAIN WO CONTRAST Result Date: 06/22/2024 EXAM: MRI BRAIN WITHOUT CONTRAST 06/22/2024 03:56:29 PM TECHNIQUE: Multiplanar multisequence MRI of the head/brain was performed without the administration of intravenous contrast. COMPARISON: Same day CT head and MRI head 05/12/2024. CLINICAL HISTORY: Stroke, follow up. FINDINGS: BRAIN AND VENTRICLES: There is a focus of acute infarct within the posterior left cerebellum  involving a portion of the left PICA territory. There are numerous additional scattered areas of infarct involving the cortex and subcortical white matter within the bilateral frontal and parietal lobes. Additional areas of more ill-defined diffusion signal abnormality in the cortex primarily of the right middle frontal gyrus and in the left middle frontal gyrus without definite restricted diffusion which may reflect areas of subacute infarct. There are scattered and confluent areas of T2 and FLAIR hyperintensity in the periventricular and subcortical white matter with additional areas of signal abnormality in the pons compatible with extensive chronic microvascular ischemic changes. There are remote lacunar infarcts in the bilateral basal ganglia and thalami. Additional areas of remote cortical infarct in the lateral right temporal lobe and in the bilateral occipital lobes. Additional regions of remote infarct and prior petechial hemorrhage in the right frontal lobe. Redemonstrated now remote infarcts in the right cerebellum. No acute intracranial hemorrhage. No mass. No midline shift. No hydrocephalus. The sella is unremarkable. Normal flow voids. ORBITS: No acute abnormality. SINUSES AND MASTOIDS: No acute abnormality. BONES AND SOFT TISSUES: Normal marrow signal. No acute soft tissue abnormality. IMPRESSION: 1. Acute infarct in the posterior left cerebellum involving a portion of the left PICA territory. 2. Numerous additional scattered acute cortical and subcortical infarcts in the bilateral frontal and parietal lobes which are new from prior. 3. Additional bilateral subacute cortical infarcts corresponding to areas of infarct on the prior MRI. 4. Extensive chronic microvascular ischemic changes and remote infarcts as above. Electronically signed by: Donnice Mania MD 06/22/2024 04:20 PM EST RP Workstation: [id number]   CT Head Wo Contrast Result Date: 06/22/2024 EXAM: CT HEAD WITHOUT CONTRAST 06/22/2024  12:33:00 PM TECHNIQUE: CT of the head was performed without the administration of intravenous contrast. Automated exposure control, iterative reconstruction, and/or weight based adjustment of the mA/kV was utilized to reduce the radiation dose to as low as reasonably achievable. COMPARISON: CT head 05/12/2024 CLINICAL HISTORY: Headache, new onset (Age >= 51y) FINDINGS: BRAIN AND VENTRICLES: No acute hemorrhage. Since October 15, new/interval acute or subacute left cerebellar infarct. Additional remote bilateral cerebellar infarcts. Remote left thalamic lacunar infarct. Remote right posterior temporal infarct. Patchy white matter hypodensities, compatible with chronic microvascular ischemic changes. No hydrocephalus. No extra-axial collection. No mass effect or midline shift. ORBITS: No acute abnormality. SINUSES: No acute abnormality. SOFT TISSUES AND SKULL: No skull fracture. IMPRESSION: 1. Since October 15, new/interval acute or subacute left cerebellar infarct. Recommend MRI head. 2. Additional remote infarcts and chronic microvascular ischemic change. Electronically signed by: Gilmore Molt MD 06/22/2024 12:40 PM EST RP Workstation: [id number]   ECHO TEE Result Date: 06/16/2024    TRANSESOPHOGEAL ECHO REPORT   Patient Name:   MEGUMI TREASTER Date of Exam: 06/16/2024 Medical Rec #:  [id number]       Height:       64.0 in Accession #:    [id number]      Weight:       110.1 lb Date of Birth:  Jul 12, 1946      BSA:  1.518 m Patient Age:    77 years        BP:           142/83 mmHg Patient Gender: F               HR:           100 bpm. Exam Location:  Outpatient Procedure: Transesophageal Echo, Cardiac Doppler, Color Doppler and 3D Echo            (Both Spectral and Color Flow Doppler were utilized during            procedure). Indications:     stroke  History:         Patient has prior history of Echocardiogram examinations, most                  recent 05/13/2024. Risk Factors:Hypertension and  Dyslipidemia.  Sonographer:     Tinnie Barefoot RDCS Referring Phys:  [id number] MAUDE JAYSON EMMER Diagnosing Phys: Maude Emmer MD PROCEDURE: After discussion of the risks and benefits of a TEE, an informed consent was obtained from the patient. The transesophogeal probe was passed without difficulty through the esophogus of the patient. Imaged were obtained with the patient in a left lateral decubitus position. Sedation performed by different physician. The patient was monitored while under deep sedation. Anesthestetic sedation was provided intravenously by Anesthesiology: 97mg  of Propofol , 60mg  of Lidocaine . The patient's vital  signs; including heart rate, blood pressure, and oxygen saturation; remained stable throughout the procedure. The patient developed no complications during the procedure.  IMPRESSIONS  1. Left ventricular ejection fraction, by estimation, is 60 to 65%. The left ventricle has normal function. The left ventricle has no regional wall motion abnormalities.  2. Right ventricular systolic function is normal. The right ventricular size is normal.  3. No left atrial/left atrial appendage thrombus was detected.  4. The mitral valve is abnormal. Trivial mitral valve regurgitation. No evidence of mitral stenosis.  5. Nodular calcification of right coronay cusp adjacent to the non cusp commisure. I do not think this represents a papilloma . The aortic valve is normal in structure. There is moderate calcification of the aortic valve. Aortic valve regurgitation is not visualized. Aortic valve sclerosis is present, with no evidence of aortic valve stenosis.  6. The inferior vena cava is normal in size with greater than 50% respiratory variability, suggesting right atrial pressure of 3 mmHg.  7. Agitated saline contrast bubble study was negative, with no evidence of any interatrial shunt.  8. 3D performed of the aortic valve and demonstrates 3D of AV done. Conclusion(s)/Recommendation(s): Normal biventricular  function without evidence of hemodynamically significant valvular heart disease. FINDINGS  Left Ventricle: Left ventricular ejection fraction, by estimation, is 60 to 65%. The left ventricle has normal function. The left ventricle has no regional wall motion abnormalities. The left ventricular internal cavity size was normal in size. There is  no left ventricular hypertrophy. Right Ventricle: The right ventricular size is normal. No increase in right ventricular wall thickness. Right ventricular systolic function is normal. Left Atrium: Left atrial size was normal in size. No left atrial/left atrial appendage thrombus was detected. Right Atrium: Right atrial size was normal in size. Pericardium: There is no evidence of pericardial effusion. Mitral Valve: The mitral valve is abnormal. There is mild thickening of the mitral valve leaflet(s). Trivial mitral valve regurgitation. No evidence of mitral valve stenosis. Tricuspid Valve: The tricuspid valve is normal in structure. Tricuspid valve regurgitation  is not demonstrated. No evidence of tricuspid stenosis. Aortic Valve: Nodular calcification of right coronay cusp adjacent to the non cusp commisure. I do not think this represents a papilloma. The aortic valve is normal in structure. There is moderate calcification of the aortic valve. There is moderate aortic valve annular calcification. Aortic valve regurgitation is not visualized. Aortic valve sclerosis is present, with no evidence of aortic valve stenosis. Pulmonic Valve: The pulmonic valve was normal in structure. Pulmonic valve regurgitation is not visualized. No evidence of pulmonic stenosis. Aorta: The aortic root is normal in size and structure. Venous: The inferior vena cava is normal in size with greater than 50% respiratory variability, suggesting right atrial pressure of 3 mmHg. IAS/Shunts: No atrial level shunt detected by color flow Doppler. Agitated saline contrast was given intravenously to evaluate  for intracardiac shunting. Agitated saline contrast bubble study was negative, with no evidence of any interatrial shunt. Additional Comments: 3D was performed not requiring image post processing on an independent workstation and was normal. Maude Emmer MD Electronically signed by Maude Emmer MD Signature Date/Time: 06/16/2024/11:55:06 AM    Final    EP STUDY Result Date: 06/16/2024 See surgical note for result.     06/22/2024 Initial Diagnosis   The patient was mated to the hospital with recurrent stroke.  CT imaging showed large amount of ascites and she underwent paracentesis.   06/23/2024 Pathology Results   CYTOLOGY - NON PAP  CASE: [id number]  PATIENT: Michele SENTERS  Non-Gynecological Cytology Report   Clinical History: None provided  Specimen Submitted:  A. ASCITES, PARACENTESIS:    FINAL MICROSCOPIC DIAGNOSIS:  - Malignant cells present  - See comment   SPECIMEN ADEQUACY:  Satisfactory for evaluation   DIAGNOSTIC COMMENTS:  Immunohistochemical stains show that the tumor cells are positive for CK7, PAX8 and ER (patchy) while they are negative for TTF-1, GATA3, calretinin, Napsin A, CDX2 and CK20.  The findings are consistent with a gynecologic primary.  Immunohistochemical stains for p16 and p53 show overexpression but a definite clonal pattern is not present.  The  findings are suggestive but not diagnostic of a serous carcinoma. Clinical and radiologic correlation is suggested.    07/08/2024 Initial Diagnosis   Ovarian cancer (HCC)   07/08/2024 Cancer Staging   Staging form: Ovary, Fallopian Tube, and Primary Peritoneal Carcinoma, AJCC 8th Edition - Clinical stage from 07/08/2024: FIGO Stage IV (cT3, cN0, cM1) - Signed by Lonn Hicks, MD on 07/08/2024 Stage prefix: Initial diagnosis   07/19/2024 -  Chemotherapy   Patient is on Treatment Plan : OVARIAN Carboplatin (AUC 6) + Paclitaxel (175) q21d X 6 Cycles       MEDICAL HISTORY:  Past Medical History:   Diagnosis Date   Anxiety    Depression    Hypertension    Stroke (cerebrum) Scnetx)     SURGICAL HISTORY: Past Surgical History:  Procedure Laterality Date   DILATION AND CURETTAGE OF UTERUS  2002   FACELIFT  2005   IR PARACENTESIS  06/23/2024   TRANSESOPHAGEAL ECHOCARDIOGRAM (CATH LAB) N/A 06/16/2024   Procedure: TRANSESOPHAGEAL ECHOCARDIOGRAM;  Surgeon: Emmer Maude BROCKS, MD;  Location: [facility];  Service: Cardiovascular;  Laterality: N/A;    SOCIAL HISTORY: Social History   Socioeconomic History   Marital status: Divorced    Spouse name: Not on file   Number of children: Not on file   Years of education: Not on file   Highest education level: Master's degree (e.g., MA, MS, MEng, MEd, MSW,  MBA)  Occupational History   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  Tobacco Use   Smoking status: Former    Current packs/day: 0.00    Average packs/day: 1 pack/day for 10.0 years (10.0 ttl pk-yrs)    Types: Cigarettes    Start date: 07/29/1976    Quit date: 1988    Years since quitting: 37.9   Smokeless tobacco: Never  Vaping Use   Vaping status: Never Used  Substance and Sexual Activity   Alcohol  use: Yes    Alcohol /week: 1.0 - 2.0 standard drink of alcohol     Types: 1 - 2 Standard drinks or equivalent per week    Comment: social   Drug use: No   Sexual activity: Not Currently  Other Topics Concern   Not on file  Social History Narrative   Not on file   Social Drivers of Health   Tobacco Use: Medium Risk (07/09/2024)   Patient History    Smoking Tobacco Use: Former    Smokeless Tobacco Use: Never    Passive Exposure: Not on Actuary Strain: Low Risk (08/04/2023)   Overall Financial Resource Strain (CARDIA)    Difficulty of Paying Living Expenses: Not hard at all  Food Insecurity: No Food Insecurity (06/22/2024)   Epic    Worried About Radiation Protection Practitioner of Food in the Last Year: Never true    Ran Out of Food in the Last Year: Never true  Transportation  Needs: No Transportation Needs (06/22/2024)   Epic    Lack of Transportation (Medical): No    Lack of Transportation (Non-Medical): No  Physical Activity: Sufficiently Active (08/04/2023)   Exercise Vital Sign    Days of Exercise per Week: 6 days    Minutes of Exercise per Session: 90 min  Stress: Stress Concern Present (08/04/2023)   [employer] of Occupational Health - Occupational Stress Questionnaire    Feeling of Stress : Very much  Social Connections: Moderately Isolated (06/22/2024)   Social Connection and Isolation Panel    Frequency of Communication with Friends and Family: Three times a week    Frequency of Social Gatherings with Friends and Family: Once a week    Attends Religious Services: Never    Database Administrator or Organizations: Yes    Attends Banker Meetings: 1 to 4 times per year    Marital Status: Divorced  Intimate Partner Violence: Not At Risk (06/22/2024)   Epic    Fear of Current or Ex-Partner: No    Emotionally Abused: No    Physically Abused: No    Sexually Abused: No  Depression (PHQ2-9): High Risk (06/09/2024)   Depression (PHQ2-9)    PHQ-2 Score: 11  Alcohol  Screen: Low Risk (08/04/2023)   Alcohol  Screen    Last Alcohol  Screening Score (AUDIT): 2  Housing: Low Risk (06/22/2024)   Epic    Unable to Pay for Housing in the Last Year: No    Number of Times Moved in the Last Year: 0    Homeless in the Last Year: No  Utilities: Not At Risk (06/22/2024)   Epic    Threatened with loss of utilities: No  Health Literacy: Not on file    FAMILY HISTORY: Family History  Problem Relation Age of Onset   Hypertension Mother    Depression Father    Hypertension Father     ALLERGIES:  is allergic to codeine.  MEDICATIONS:  Current Outpatient Medications  Medication Sig Dispense Refill   dexamethasone (DECADRON)  4 MG tablet Take 2 tabs at the night before and 2 tab the morning of chemotherapy, every 3 weeks, by mouth x 6 cycles 24  tablet 6   artificial tears ophthalmic solution Place 1 drop into both eyes as needed for dry eyes. 15 mL 0   Ascorbic Acid (VITAMIN C CR) 1500 MG TBCR Take 1,500 mg by mouth daily.     aspirin  EC 81 MG tablet Take 1 tablet (81 mg total) by mouth daily for 21 days. Swallow whole.     atorvastatin  (LIPITOR) 10 MG tablet TAKE 1 TABLET BY MOUTH AT  BEDTIME 100 tablet 2   B Complex-Folic Acid  (BALANCED B-150 PO) Take 1 capsule by mouth daily.     busPIRone  (BUSPAR ) 7.5 MG tablet TAKE 1 TABLET BY MOUTH 3 TIMES  DAILY (Patient taking differently: Take 7.5 mg by mouth at bedtime.) 300 tablet 0   CALCIUM  MAGNESIUM ZINC PO Take 1 tablet by mouth in the morning.     Cholecalciferol  (VITAMIN D3) 5000 units CAPS Take 5,000 Units by mouth daily with breakfast.     clopidogrel  (PLAVIX ) 75 MG tablet Take 1 tablet (75 mg total) by mouth daily. 30 tablet 0   Cyanocobalamin  (B-12) 1000 MCG CAPS Take 1,000 mcg by mouth in the morning.     folic acid  (FOLVITE ) 800 MCG tablet Take 800 mcg by mouth daily.     lactulose  (CHRONULAC ) 10 GM/15ML solution Take 15 mLs (10 g total) by mouth 2 (two) times daily as needed for mild constipation. Titrate up as needed for goal of 1 BM per day 236 mL 2   lidocaine -prilocaine (EMLA) cream Apply to affected area once 30 g 3   mirtazapine  (REMERON ) 7.5 MG tablet Take 1 tablet (7.5 mg total) by mouth at bedtime. 30 tablet 2   Omega-3 Fatty Acids (OMEGA-3 FISH OIL PO) Take 4,000 mg by mouth daily.     ondansetron  (ZOFRAN ) 4 MG tablet Take 1 tablet (4 mg total) by mouth every 8 (eight) hours as needed for nausea or vomiting. 20 tablet 1   ondansetron  (ZOFRAN ) 8 MG tablet Take 1 tablet (8 mg total) by mouth every 8 (eight) hours as needed for nausea or vomiting. Start on the third day after carboplatin. 30 tablet 1   oxyCODONE  (OXY IR/ROXICODONE ) 5 MG immediate release tablet Take 1 tablet (5 mg total) by mouth every 4 (four) hours as needed for severe pain (pain score 7-10) or  breakthrough pain. 30 tablet 0   polyethylene glycol powder (GLYCOLAX /MIRALAX ) 17 GM/SCOOP powder Take 17 g by mouth daily as needed for mild constipation. Dissolve 1 capful (17g) in 4-8 ounces of liquid and take by mouth daily.     prochlorperazine (COMPAZINE) 10 MG tablet Take 1 tablet (10 mg total) by mouth every 6 (six) hours as needed for nausea or vomiting. 30 tablet 1   promethazine  (PHENERGAN ) 25 MG tablet Take 1 tablet (25 mg total) by mouth every 6 (six) hours as needed for nausea or vomiting. 30 tablet 2   spironolactone  (ALDACTONE ) 100 MG tablet Take 1 tablet (100 mg total) by mouth daily. 90 tablet 1   vitamin A 8000 UNIT capsule Take 8,000 Units by mouth daily.     VITAMIN E-1000 PO Take 1,000 Units by mouth daily.     Vitamins-Lipotropics (BALANCED B-150 COMPLEX TR PO) Take 1 tablet by mouth See admin instructions. Take 1 tablet by mouth once a day (contains b-1 @ 150 mg, b-2 150 mg, b-6 150  mg, b-12 @ 150 mg, and niacin 150 mg)     No current facility-administered medications for this visit.    REVIEW OF SYSTEMS:  All other systems were reviewed with the patient and are negative.  PHYSICAL EXAMINATION: ECOG PERFORMANCE STATUS: 2 - Symptomatic, <50% confined to bed  Vitals:   07/09/24 1428  BP: (!) 152/81  Pulse: (!) 104  Resp: 18  Temp: 98 F (36.7 C)   Filed Weights   07/09/24 1428  Weight: 104 lb 3.2 oz (47.3 kg)    GENERAL:alert, no distress and comfortable.  She looks thin SKIN: skin color, texture, turgor are normal, no rashes or significant lesions EYES: normal, conjunctiva are pink and non-injected, sclera clear OROPHARYNX:no exudate, no erythema and lips, buccal mucosa, and tongue normal  NECK: supple, thyroid  normal size, non-tender, without nodularity LYMPH:  no palpable lymphadenopathy in the cervical, axillary or inguinal LUNGS: Reduced breath sound bilateral bases with normal breathing effort HEART: regular rate & rhythm and no murmurs and no lower  extremity edema ABDOMEN:abdomen soft, distended with ascites Musculoskeletal:no cyanosis of digits and no clubbing  PSYCH: alert & oriented x 3 with fluent speech NEURO: no focal motor/sensory deficits  LABORATORY DATA:  I have reviewed the data as listed Lab Results  Component Value Date   WBC 7.3 06/30/2024   HGB 11.4 06/30/2024   HCT 35.6 06/30/2024   MCV 88 06/30/2024   PLT 735 (H) 06/30/2024   Recent Labs    06/22/24 1251 06/23/24 0128 06/24/24 0230 06/30/24 1140  NA 130* 132* 131* 133*  K 4.1 3.7 4.6 4.6  CL 95* 97* 96* 94*  CO2 27 26 26 24   GLUCOSE 100* 102* 101* 123*  BUN 8 8 11 12   CREATININE 0.59 0.63 0.84 0.70  CALCIUM  8.9 8.4* 8.2* 9.3  GFRNONAA >60 >60 >60  --   PROT 6.3*  --  5.1* 6.0  ALBUMIN  3.1*  --  2.6* 3.7*  AST 21  --  17 19  ALT 14  --  10 13  ALKPHOS 81  --  62 96  BILITOT 0.5  --  0.4 <0.2    RADIOGRAPHIC STUDIES: I have personally reviewed the radiological images as listed and agreed with the findings in the report. VAS US  LOWER EXTREMITY VENOUS (DVT) Result Date: 06/25/2024  Lower Venous DVT Study Patient Name:  ALYSSE RATHE  Date of Exam:   06/24/2024 Medical Rec #: [id number]        Accession #:    [id number] Date of Birth: April 26, 1946       Patient Gender: F Patient Age:   57 years Exam Location:  [facility] Procedure:      VAS US  LOWER EXTREMITY VENOUS (DVT) Referring Phys: ARY XU --------------------------------------------------------------------------------  Indications: Stroke.  Comparison Study: Previous exam on 05/13/2024 was negative for DVT Performing Technologist: Ezzie Potters RVT, RDMS  Examination Guidelines: A complete evaluation includes B-mode imaging, spectral Doppler, color Doppler, and power Doppler as needed of all accessible portions of each vessel. Bilateral testing is considered an integral part of a complete examination. Limited examinations for reoccurring indications may be performed as noted. The reflux  portion of the exam is performed with the patient in reverse Trendelenburg.  +---------+---------------+---------+-----------+----------+--------------+ RIGHT    CompressibilityPhasicitySpontaneityPropertiesThrombus Aging +---------+---------------+---------+-----------+----------+--------------+ CFV      Full           Yes      Yes                                 +---------+---------------+---------+-----------+----------+--------------+  SFJ      Full                                                        +---------+---------------+---------+-----------+----------+--------------+ FV Prox  Full           Yes      Yes                                 +---------+---------------+---------+-----------+----------+--------------+ FV Mid   Full           Yes      Yes                                 +---------+---------------+---------+-----------+----------+--------------+ FV DistalFull           Yes      Yes                                 +---------+---------------+---------+-----------+----------+--------------+ PFV      Full                                                        +---------+---------------+---------+-----------+----------+--------------+ POP      Full           Yes      Yes                                 +---------+---------------+---------+-----------+----------+--------------+ PTV      Full                                                        +---------+---------------+---------+-----------+----------+--------------+ PERO     Full                                                        +---------+---------------+---------+-----------+----------+--------------+   +---------+---------------+---------+-----------+----------+--------------+ LEFT     CompressibilityPhasicitySpontaneityPropertiesThrombus Aging +---------+---------------+---------+-----------+----------+--------------+ CFV      Full           Yes      Yes                                  +---------+---------------+---------+-----------+----------+--------------+ SFJ      Full                                                        +---------+---------------+---------+-----------+----------+--------------+  FV Prox  Full           Yes      Yes                                 +---------+---------------+---------+-----------+----------+--------------+ FV Mid   Full           Yes      Yes                                 +---------+---------------+---------+-----------+----------+--------------+ FV DistalFull           Yes      Yes                                 +---------+---------------+---------+-----------+----------+--------------+ PFV      Full                                                        +---------+---------------+---------+-----------+----------+--------------+ POP      Full           Yes      Yes                                 +---------+---------------+---------+-----------+----------+--------------+ PTV      Full                                                        +---------+---------------+---------+-----------+----------+--------------+ PERO     Full                                                        +---------+---------------+---------+-----------+----------+--------------+     Summary: BILATERAL: - No evidence of deep vein thrombosis seen in the lower extremities, bilaterally. -No evidence of popliteal cyst, bilaterally. RIGHT: Subcutaneous edema of calf.   *See table(s) above for measurements and observations. Electronically signed by Norman Serve on 06/25/2024 at 7:15:05 AM.    Final    IR Paracentesis Result Date: 06/23/2024 INDICATION: Patient in the hospital with headache, left eye infection with discharge, acute brain infarct. CT chest abdomen pelvis obtained in general workup, patient found to have ascites. Request for diagnostic and therapeutic paracentesis. EXAM: ULTRASOUND  GUIDED DIAGNOSTIC AND THERAPEUTIC PARACENTESIS MEDICATIONS: 8 mL 1% lidocaine  COMPLICATIONS: None immediate. PROCEDURE: Informed written consent was obtained from the patient after a discussion of the risks, benefits and alternatives to treatment. A timeout was performed prior to the initiation of the procedure. Initial ultrasound scanning demonstrates a small amount of ascites within the right lower abdominal quadrant. The right lower abdomen was prepped and draped in the usual sterile fashion. 1% lidocaine  was used for local anesthesia. Following this, a 19 gauge, 7-cm, Yueh catheter was introduced. An ultrasound image was saved for documentation  purposes. The paracentesis was performed. The catheter was removed and a dressing was applied. The patient tolerated the procedure well without immediate post procedural complication. FINDINGS: A total of approximately 3 liters of straw colored fluid was removed. Samples were sent to the laboratory as requested by the clinical team. IMPRESSION: Successful ultrasound-guided paracentesis yielding 3 liters of peritoneal fluid. Performed by: Wyatt Pommier, PA-C Electronically Signed   By: Juliene Balder M.D.   On: 06/23/2024 17:13   CT CHEST ABDOMEN PELVIS W CONTRAST Result Date: 06/22/2024 EXAM: CT CHEST, ABDOMEN AND PELVIS WITH CONTRAST 06/22/2024 08:22:00 PM TECHNIQUE: CT of the chest, abdomen and pelvis was performed with the administration of intravenous contrast (50 mL iohexol  (OMNIPAQUE ) 350 MG/ML injection). Multiplanar reformatted images are provided for review. Automated exposure control, iterative reconstruction, and/or weight based adjustment of the mA/kV was utilized to reduce the radiation dose to as low as reasonably achievable. COMPARISON: None available. CLINICAL HISTORY: r/o malignancy. FINDINGS: CHEST: MEDIASTINUM AND LYMPH NODES: Heart and pericardium are unremarkable. The central airways are clear. No mediastinal, hilar or axillary lymphadenopathy.  Scattered coronary artery and aortic atherosclerosis. LUNGS AND PLEURA: Bilateral pleural effusions. Compressive atelectasis in the lower lobes bilaterally. No focal consolidation or pulmonary edema. No pneumothorax. ABDOMEN AND PELVIS: LIVER: Scattered cystic areas in the liver, the largest in the posterior right hepatic lobe measuring 1.7 cm. These appear benign. GALLBLADDER AND BILE DUCTS: Gallbladder is unremarkable. No biliary ductal dilatation. SPLEEN: No acute abnormality. PANCREAS: No acute abnormality. ADRENAL GLANDS: No suspicious adrenal abnormality. KIDNEYS, URETERS AND BLADDER: Mild left hydronephrosis. The left ureter is difficult to follow, but no obstructing stones seen. 1.3 cm simple appearing cyst in the mid pole of the left kidney. No suspicious renal abnormality. No stones in the right kidney. No perinephric or periureteral stranding. Urinary bladder is unremarkable. GI AND BOWEL: Stomach demonstrates no acute abnormality. There is no bowel obstruction. REPRODUCTIVE ORGANS: Large calcification in the central pelvis presumably reflects calcified fibroid. PERITONEUM AND RETROPERITONEUM: Large volume ascites throughout the abdomen and pelvis. No free air. VASCULATURE: Aorta is normal in caliber. Aortic atherosclerosis. ABDOMINAL AND PELVIS LYMPH NODES: No lymphadenopathy. BONES AND SOFT TISSUES: No acute osseous abnormality. No focal soft tissue abnormality. IMPRESSION: 1. Large volume ascites throughout the abdomen and pelvis. 2. Bilateral pleural effusions with compressive lower lobe atelectasis. 3. Mild left hydronephrosis without visible obstructing calculus. Electronically signed by: Franky Crease MD 06/22/2024 08:55 PM EST RP Workstation: [id number]   MR BRAIN WO CONTRAST Result Date: 06/22/2024 EXAM: MRI BRAIN WITHOUT CONTRAST 06/22/2024 03:56:29 PM TECHNIQUE: Multiplanar multisequence MRI of the head/brain was performed without the administration of intravenous contrast. COMPARISON: Same  day CT head and MRI head 05/12/2024. CLINICAL HISTORY: Stroke, follow up. FINDINGS: BRAIN AND VENTRICLES: There is a focus of acute infarct within the posterior left cerebellum involving a portion of the left PICA territory. There are numerous additional scattered areas of infarct involving the cortex and subcortical white matter within the bilateral frontal and parietal lobes. Additional areas of more ill-defined diffusion signal abnormality in the cortex primarily of the right middle frontal gyrus and in the left middle frontal gyrus without definite restricted diffusion which may reflect areas of subacute infarct. There are scattered and confluent areas of T2 and FLAIR hyperintensity in the periventricular and subcortical white matter with additional areas of signal abnormality in the pons compatible with extensive chronic microvascular ischemic changes. There are remote lacunar infarcts in the bilateral basal ganglia and thalami. Additional areas of remote cortical  infarct in the lateral right temporal lobe and in the bilateral occipital lobes. Additional regions of remote infarct and prior petechial hemorrhage in the right frontal lobe. Redemonstrated now remote infarcts in the right cerebellum. No acute intracranial hemorrhage. No mass. No midline shift. No hydrocephalus. The sella is unremarkable. Normal flow voids. ORBITS: No acute abnormality. SINUSES AND MASTOIDS: No acute abnormality. BONES AND SOFT TISSUES: Normal marrow signal. No acute soft tissue abnormality. IMPRESSION: 1. Acute infarct in the posterior left cerebellum involving a portion of the left PICA territory. 2. Numerous additional scattered acute cortical and subcortical infarcts in the bilateral frontal and parietal lobes which are new from prior. 3. Additional bilateral subacute cortical infarcts corresponding to areas of infarct on the prior MRI. 4. Extensive chronic microvascular ischemic changes and remote infarcts as above.  Electronically signed by: Donnice Mania MD 06/22/2024 04:20 PM EST RP Workstation: [id number]   CT Head Wo Contrast Result Date: 06/22/2024 EXAM: CT HEAD WITHOUT CONTRAST 06/22/2024 12:33:00 PM TECHNIQUE: CT of the head was performed without the administration of intravenous contrast. Automated exposure control, iterative reconstruction, and/or weight based adjustment of the mA/kV was utilized to reduce the radiation dose to as low as reasonably achievable. COMPARISON: CT head 05/12/2024 CLINICAL HISTORY: Headache, new onset (Age >= 51y) FINDINGS: BRAIN AND VENTRICLES: No acute hemorrhage. Since October 15, new/interval acute or subacute left cerebellar infarct. Additional remote bilateral cerebellar infarcts. Remote left thalamic lacunar infarct. Remote right posterior temporal infarct. Patchy white matter hypodensities, compatible with chronic microvascular ischemic changes. No hydrocephalus. No extra-axial collection. No mass effect or midline shift. ORBITS: No acute abnormality. SINUSES: No acute abnormality. SOFT TISSUES AND SKULL: No skull fracture. IMPRESSION: 1. Since October 15, new/interval acute or subacute left cerebellar infarct. Recommend MRI head. 2. Additional remote infarcts and chronic microvascular ischemic change. Electronically signed by: Gilmore Molt MD 06/22/2024 12:40 PM EST RP Workstation: [id number]   ECHO TEE Result Date: 06/16/2024    TRANSESOPHOGEAL ECHO REPORT   Patient Name:   MAIZE BRITTINGHAM Date of Exam: 06/16/2024 Medical Rec #:  [id number]       Height:       64.0 in Accession #:    [id number]      Weight:       110.1 lb Date of Birth:  Feb 28, 1946      BSA:          1.518 m Patient Age:    77 years        BP:           142/83 mmHg Patient Gender: F               HR:           100 bpm. Exam Location:  Outpatient Procedure: Transesophageal Echo, Cardiac Doppler, Color Doppler and 3D Echo            (Both Spectral and Color Flow Doppler were utilized during             procedure). Indications:     stroke  History:         Patient has prior history of Echocardiogram examinations, most                  recent 05/13/2024. Risk Factors:Hypertension and Dyslipidemia.  Sonographer:     Tinnie Barefoot RDCS Referring Phys:  [id number] MAUDE JAYSON EMMER Diagnosing Phys: Maude Emmer MD PROCEDURE: After discussion of the risks and benefits of a TEE, an informed consent was obtained  from the patient. The transesophogeal probe was passed without difficulty through the esophogus of the patient. Imaged were obtained with the patient in a left lateral decubitus position. Sedation performed by different physician. The patient was monitored while under deep sedation. Anesthestetic sedation was provided intravenously by Anesthesiology: 97mg  of Propofol , 60mg  of Lidocaine . The patient's vital  signs; including heart rate, blood pressure, and oxygen saturation; remained stable throughout the procedure. The patient developed no complications during the procedure.  IMPRESSIONS  1. Left ventricular ejection fraction, by estimation, is 60 to 65%. The left ventricle has normal function. The left ventricle has no regional wall motion abnormalities.  2. Right ventricular systolic function is normal. The right ventricular size is normal.  3. No left atrial/left atrial appendage thrombus was detected.  4. The mitral valve is abnormal. Trivial mitral valve regurgitation. No evidence of mitral stenosis.  5. Nodular calcification of right coronay cusp adjacent to the non cusp commisure. I do not think this represents a papilloma . The aortic valve is normal in structure. There is moderate calcification of the aortic valve. Aortic valve regurgitation is not visualized. Aortic valve sclerosis is present, with no evidence of aortic valve stenosis.  6. The inferior vena cava is normal in size with greater than 50% respiratory variability, suggesting right atrial pressure of 3 mmHg.  7. Agitated saline contrast bubble  study was negative, with no evidence of any interatrial shunt.  8. 3D performed of the aortic valve and demonstrates 3D of AV done. Conclusion(s)/Recommendation(s): Normal biventricular function without evidence of hemodynamically significant valvular heart disease. FINDINGS  Left Ventricle: Left ventricular ejection fraction, by estimation, is 60 to 65%. The left ventricle has normal function. The left ventricle has no regional wall motion abnormalities. The left ventricular internal cavity size was normal in size. There is  no left ventricular hypertrophy. Right Ventricle: The right ventricular size is normal. No increase in right ventricular wall thickness. Right ventricular systolic function is normal. Left Atrium: Left atrial size was normal in size. No left atrial/left atrial appendage thrombus was detected. Right Atrium: Right atrial size was normal in size. Pericardium: There is no evidence of pericardial effusion. Mitral Valve: The mitral valve is abnormal. There is mild thickening of the mitral valve leaflet(s). Trivial mitral valve regurgitation. No evidence of mitral valve stenosis. Tricuspid Valve: The tricuspid valve is normal in structure. Tricuspid valve regurgitation is not demonstrated. No evidence of tricuspid stenosis. Aortic Valve: Nodular calcification of right coronay cusp adjacent to the non cusp commisure. I do not think this represents a papilloma. The aortic valve is normal in structure. There is moderate calcification of the aortic valve. There is moderate aortic valve annular calcification. Aortic valve regurgitation is not visualized. Aortic valve sclerosis is present, with no evidence of aortic valve stenosis. Pulmonic Valve: The pulmonic valve was normal in structure. Pulmonic valve regurgitation is not visualized. No evidence of pulmonic stenosis. Aorta: The aortic root is normal in size and structure. Venous: The inferior vena cava is normal in size with greater than 50% respiratory  variability, suggesting right atrial pressure of 3 mmHg. IAS/Shunts: No atrial level shunt detected by color flow Doppler. Agitated saline contrast was given intravenously to evaluate for intracardiac shunting. Agitated saline contrast bubble study was negative, with no evidence of any interatrial shunt. Additional Comments: 3D was performed not requiring image post processing on an independent workstation and was normal. Maude Emmer MD Electronically signed by Maude Emmer MD Signature Date/Time: 06/16/2024/11:55:06 AM  Final    EP STUDY Result Date: 06/16/2024 See surgical note for result.

## 2024-07-09 NOTE — Telephone Encounter (Signed)
 Spoke with the patient's caregiver regarding the referral to GYN oncology. Patient scheduled as new patient with Dr Viktoria on 12/19 at 10:30 am. Patient given an arrival time of 10:15 am   Explained to the patient the the doctor will perform a pelvic exam at this visit. Patient given the policy that only one visitor allowed and that visitor must be over 16 yrs are allowed in the [facility]. Patient given the address/phone number for the clinic and that the center offers free valet service. Patient aware that masks optional.

## 2024-07-11 ENCOUNTER — Other Ambulatory Visit: Payer: Self-pay | Admitting: Radiology

## 2024-07-11 ENCOUNTER — Other Ambulatory Visit: Payer: Self-pay

## 2024-07-12 ENCOUNTER — Encounter: Payer: Self-pay | Admitting: Hematology and Oncology

## 2024-07-12 ENCOUNTER — Ambulatory Visit (HOSPITAL_COMMUNITY): Admission: RE | Admit: 2024-07-12 | Discharge: 2024-07-12 | Attending: Hematology and Oncology

## 2024-07-12 DIAGNOSIS — C569 Malignant neoplasm of unspecified ovary: Secondary | ICD-10-CM

## 2024-07-12 DIAGNOSIS — C799 Secondary malignant neoplasm of unspecified site: Secondary | ICD-10-CM | POA: Diagnosis not present

## 2024-07-12 DIAGNOSIS — R180 Malignant ascites: Secondary | ICD-10-CM | POA: Diagnosis not present

## 2024-07-12 MED ORDER — LIDOCAINE-EPINEPHRINE (PF) 2 %-1:200000 IJ SOLN
INTRAMUSCULAR | Status: AC
  Filled 2024-07-12: qty 20

## 2024-07-12 NOTE — H&P (Signed)
 Chief Complaint: Ovarian cancer; referred for Port-A-Cath placement to assist with treatment  Referring Provider(s): Gorsuch,N  Supervising Physician: Jennefer Rover  Patient Status: [facility] - Out-pt  History of Present Illness: Michele Tyler is a 78 y.o. 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  She is known to IR team from paracentesis on 06/23/2024.  She presents today for Port-A-Cath placement to assist with treatment.  *** Patient is Full Code  Past Medical History:  Diagnosis Date   Anxiety    Depression    Hypertension    Stroke (cerebrum) [facility])     Past Surgical History:  Procedure Laterality Date   DILATION AND CURETTAGE OF UTERUS  2002   FACELIFT  2005   IR PARACENTESIS  06/23/2024   TRANSESOPHAGEAL ECHOCARDIOGRAM (CATH LAB) N/A 06/16/2024   Procedure: TRANSESOPHAGEAL ECHOCARDIOGRAM;  Surgeon: Delford Maude BROCKS, MD;  Location: [facility] INVASIVE CV LAB;  Service: Cardiovascular;  Laterality: N/A;    Allergies: Codeine  Medications: Prior to Admission medications  Medication Sig Start Date End Date Taking? Authorizing Provider  artificial tears ophthalmic solution Place 1 drop into both eyes as needed for dry eyes. 06/24/24   Cindy Garnette POUR, MD  Ascorbic Acid (VITAMIN C CR) 1500 MG TBCR Take 1,500 mg by mouth daily.    [provider]  aspirin  EC 81 MG tablet Take 1 tablet (81 mg total) by mouth daily for 21 days. Swallow whole. 06/24/24 07/15/24  Cindy Garnette POUR, MD  atorvastatin  (LIPITOR) 10 MG tablet TAKE 1 TABLET BY MOUTH AT  BEDTIME 05/19/24   [name], MD  B Complex-Folic Acid  (BALANCED B-150 PO) Take 1 capsule by mouth daily.    [provider]  busPIRone  (BUSPAR ) 7.5 MG tablet TAKE 1 TABLET BY MOUTH 3 TIMES  DAILY Patient taking differently: Take 7.5 mg by mouth at bedtime.  12/25/23   [name], MD  CALCIUM  MAGNESIUM ZINC PO Take 1 tablet by mouth in the morning.    [provider]  Cholecalciferol  (VITAMIN D3) 5000 units CAPS Take 5,000 Units by mouth daily with breakfast.    [provider]  clopidogrel  (PLAVIX ) 75 MG tablet Take 1 tablet (75 mg total) by mouth daily. 06/25/24   Cindy Garnette POUR, MD  Cyanocobalamin  (B-12) 1000 MCG CAPS Take 1,000 mcg by mouth in the morning.    [provider]  dexamethasone  (DECADRON ) 4 MG tablet Take 2 tabs at the night before and 2 tab the morning of chemotherapy, every 3 weeks, by mouth x 6 cycles 07/09/24   Lonn Hicks, MD  folic acid  (FOLVITE ) 800 MCG tablet Take 800 mcg by mouth daily.    [provider]  lactulose  (CHRONULAC ) 10 GM/15ML solution Take 15 mLs (10 g total) by mouth 2 (two) times daily as needed for mild constipation. Titrate up as needed for goal of 1 BM per day 07/06/24   [name], MD  lidocaine -prilocaine  (EMLA ) cream Apply to affected area once 07/09/24   Lonn Hicks, MD  mirtazapine  (REMERON ) 7.5 MG tablet Take 1 tablet (7.5 mg total) by mouth at bedtime. 06/30/24   [name], MD  Omega-3 Fatty Acids (OMEGA-3 FISH OIL PO) Take 4,000 mg by mouth daily.    [provider]  ondansetron  (ZOFRAN ) 4 MG tablet Take 1 tablet (4 mg total) by mouth every 8 (eight) hours as needed for nausea or  vomiting. 06/30/24   [name], MD  ondansetron  (ZOFRAN ) 8 MG tablet Take 1 tablet (8 mg total) by mouth every 8 (eight) hours as needed for nausea or vomiting. Start on the third day after carboplatin. 07/09/24   Lonn Hicks, MD  oxyCODONE  (OXY IR/ROXICODONE ) 5 MG immediate release tablet Take 1 tablet (5 mg total) by mouth every 4 (four) hours as needed for severe pain (pain score 7-10) or breakthrough pain. 07/06/24   [name], MD  polyethylene glycol powder (GLYCOLAX /MIRALAX ) 17 GM/SCOOP powder Take 17 g by mouth daily as needed for mild constipation.  Dissolve 1 capful (17g) in 4-8 ounces of liquid and take by mouth daily.    [provider]  prochlorperazine  (COMPAZINE ) 10 MG tablet Take 1 tablet (10 mg total) by mouth every 6 (six) hours as needed for nausea or vomiting. 07/09/24   Lonn Hicks, MD  promethazine  (PHENERGAN ) 25 MG tablet Take 1 tablet (25 mg total) by mouth every 6 (six) hours as needed for nausea or vomiting. 07/06/24   [name], MD  spironolactone  (ALDACTONE ) 100 MG tablet Take 1 tablet (100 mg total) by mouth daily. 06/30/24   [name], MD  vitamin A 8000 UNIT capsule Take 8,000 Units by mouth daily.    [provider]  VITAMIN E-1000 PO Take 1,000 Units by mouth daily.    [provider]  Vitamins-Lipotropics (BALANCED B-150 COMPLEX TR PO) Take 1 tablet by mouth See admin instructions. 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  Tobacco Use   Smoking status: Former    Current packs/day: 0.00    Average packs/day: 1 pack/day for 10.0 years (10.0 ttl pk-yrs)    Types: Cigarettes    Start date: 07/29/1976    Quit date: 1988    Years since quitting: 37.9   Smokeless tobacco: Never  Vaping Use   Vaping status: Never Used  Substance and Sexual Activity   Alcohol  use: Yes    Alcohol /week: 1.0 - 2.0 standard drink of alcohol     Types: 1 - 2 Standard drinks or equivalent per week    Comment: social   Drug use: No   Sexual activity: Not Currently  Other Topics  Concern   Not on file  Social History Narrative   Not on file   Social Drivers of Health   Tobacco Use: Medium Risk (07/09/2024)   Patient History    Smoking Tobacco Use: Former    Smokeless Tobacco Use: Never    Passive Exposure: Not on file  Financial Resource Strain: Low Risk (08/04/2023)   Overall Financial Resource Strain (CARDIA)    Difficulty of Paying Living Expenses: Not hard at all  Food Insecurity: No Food Insecurity (06/22/2024)   Epic    Worried About Radiation Protection Practitioner of Food in the Last Year:  Never true    Ran Out of Food in the Last Year: Never true  Transportation Needs: No Transportation Needs (06/22/2024)   Epic    Lack of Transportation (Medical): No    Lack of Transportation (Non-Medical): No  Physical Activity: Sufficiently Active (08/04/2023)   Exercise Vital Sign    Days of Exercise per Week: 6 days    Minutes of Exercise per Session: 90 min  Stress: Stress Concern Present (08/04/2023)   [employer] of Occupational Health - Occupational Stress Questionnaire    Feeling of Stress : Very much  Social Connections: Moderately Isolated (06/22/2024)   Social Connection and Isolation Panel    Frequency of Communication with Friends and Family: Three times a week    Frequency of Social Gatherings with Friends and Family: Once a week    Attends Religious Services: Never    Database Administrator or Organizations: Yes    Attends Banker Meetings: 1 to 4 times per year    Marital Status: Divorced  Depression (PHQ2-9): High Risk (06/09/2024)   Depression (PHQ2-9)    PHQ-2 Score: 11  Alcohol  Screen: Low Risk (08/04/2023)   Alcohol  Screen    Last Alcohol  Screening Score (AUDIT): 2  Housing: Low Risk (06/22/2024)   Epic    Unable to Pay for Housing in the Last Year: No    Number of Times Moved in the Last Year: 0    Homeless in the Last Year: No  Utilities: Not At Risk (06/22/2024)   Epic    Threatened with loss of utilities: No  Health Literacy: Not  on file       Review of Systems  Vital Signs:   Advance Care Plan: no documents on file  .  Physical Exam  Imaging: VAS US  LOWER EXTREMITY VENOUS (DVT) Result Date: 06/25/2024  Lower Venous DVT Study Patient Name:  Michele Tyler  Date of Exam:   06/24/2024 Medical Rec #: [id number]        Accession #:    [id number] Date of Birth: 01/30/1946       Patient Gender: F Patient Age:   52 years Exam Location:  [facility] Procedure:      VAS US  LOWER EXTREMITY VENOUS (DVT) Referring Phys: ARY XU --------------------------------------------------------------------------------  Indications: Stroke.  Comparison Study: Previous exam on 05/13/2024 was negative for DVT Performing Technologist: Ezzie Potters RVT, RDMS  Examination Guidelines: A complete evaluation includes B-mode imaging, spectral Doppler, color Doppler, and power Doppler as needed of all accessible portions of each vessel. Bilateral testing is considered an integral part of a complete examination. Limited examinations for reoccurring indications may be performed as noted. The reflux portion of the exam is performed with the patient in reverse Trendelenburg.  +---------+---------------+---------+-----------+----------+--------------+ RIGHT    CompressibilityPhasicitySpontaneityPropertiesThrombus Aging +---------+---------------+---------+-----------+----------+--------------+ CFV      Full           Yes      Yes                                 +---------+---------------+---------+-----------+----------+--------------+ SFJ      Full                                                        +---------+---------------+---------+-----------+----------+--------------+  FV Prox  Full           Yes      Yes                                 +---------+---------------+---------+-----------+----------+--------------+ FV Mid   Full           Yes      Yes                                  +---------+---------------+---------+-----------+----------+--------------+ FV DistalFull           Yes      Yes                                 +---------+---------------+---------+-----------+----------+--------------+ PFV      Full                                                        +---------+---------------+---------+-----------+----------+--------------+ POP      Full           Yes      Yes                                 +---------+---------------+---------+-----------+----------+--------------+ PTV      Full                                                        +---------+---------------+---------+-----------+----------+--------------+ PERO     Full                                                        +---------+---------------+---------+-----------+----------+--------------+   +---------+---------------+---------+-----------+----------+--------------+ LEFT     CompressibilityPhasicitySpontaneityPropertiesThrombus Aging +---------+---------------+---------+-----------+----------+--------------+ CFV      Full           Yes      Yes                                 +---------+---------------+---------+-----------+----------+--------------+ SFJ      Full                                                        +---------+---------------+---------+-----------+----------+--------------+ FV Prox  Full           Yes      Yes                                 +---------+---------------+---------+-----------+----------+--------------+ FV Mid   Full  Yes      Yes                                 +---------+---------------+---------+-----------+----------+--------------+ FV DistalFull           Yes      Yes                                 +---------+---------------+---------+-----------+----------+--------------+ PFV      Full                                                         +---------+---------------+---------+-----------+----------+--------------+ POP      Full           Yes      Yes                                 +---------+---------------+---------+-----------+----------+--------------+ PTV      Full                                                        +---------+---------------+---------+-----------+----------+--------------+ PERO     Full                                                        +---------+---------------+---------+-----------+----------+--------------+     Summary: BILATERAL: - No evidence of deep vein thrombosis seen in the lower extremities, bilaterally. -No evidence of popliteal cyst, bilaterally. RIGHT: Subcutaneous edema of calf.   *See table(s) above for measurements and observations. Electronically signed by Norman Serve on 06/25/2024 at 7:15:05 AM.    Final    IR Paracentesis Result Date: 06/23/2024 INDICATION: Patient in the hospital with headache, left eye infection with discharge, acute brain infarct. CT chest abdomen pelvis obtained in general workup, patient found to have ascites. Request for diagnostic and therapeutic paracentesis. EXAM: ULTRASOUND GUIDED DIAGNOSTIC AND THERAPEUTIC PARACENTESIS MEDICATIONS: 8 mL 1% lidocaine  COMPLICATIONS: None immediate. PROCEDURE: Informed written consent was obtained from the patient after a discussion of the risks, benefits and alternatives to treatment. A timeout was performed prior to the initiation of the procedure. Initial ultrasound scanning demonstrates a small amount of ascites within the right lower abdominal quadrant. The right lower abdomen was prepped and draped in the usual sterile fashion. 1% lidocaine  was used for local anesthesia. Following this, a 19 gauge, 7-cm, Yueh catheter was introduced. An ultrasound image was saved for documentation purposes. The paracentesis was performed. The catheter was removed and a dressing was applied. The patient tolerated the procedure  well without immediate post procedural complication. FINDINGS: A total of approximately 3 liters of straw colored fluid was removed. Samples were sent to the laboratory as requested by the clinical team. IMPRESSION: Successful ultrasound-guided paracentesis yielding 3 liters of peritoneal fluid. Performed by: Wyatt Pommier, PA-C Electronically Signed  By: Juliene Balder M.D.   On: 06/23/2024 17:13   CT CHEST ABDOMEN PELVIS W CONTRAST Result Date: 06/22/2024 EXAM: CT CHEST, ABDOMEN AND PELVIS WITH CONTRAST 06/22/2024 08:22:00 PM TECHNIQUE: CT of the chest, abdomen and pelvis was performed with the administration of intravenous contrast (50 mL iohexol  (OMNIPAQUE ) 350 MG/ML injection). Multiplanar reformatted images are provided for review. Automated exposure control, iterative reconstruction, and/or weight based adjustment of the mA/kV was utilized to reduce the radiation dose to as low as reasonably achievable. COMPARISON: None available. CLINICAL HISTORY: r/o malignancy. FINDINGS: CHEST: MEDIASTINUM AND LYMPH NODES: Heart and pericardium are unremarkable. The central airways are clear. No mediastinal, hilar or axillary lymphadenopathy. Scattered coronary artery and aortic atherosclerosis. LUNGS AND PLEURA: Bilateral pleural effusions. Compressive atelectasis in the lower lobes bilaterally. No focal consolidation or pulmonary edema. No pneumothorax. ABDOMEN AND PELVIS: LIVER: Scattered cystic areas in the liver, the largest in the posterior right hepatic lobe measuring 1.7 cm. These appear benign. GALLBLADDER AND BILE DUCTS: Gallbladder is unremarkable. No biliary ductal dilatation. SPLEEN: No acute abnormality. PANCREAS: No acute abnormality. ADRENAL GLANDS: No suspicious adrenal abnormality. KIDNEYS, URETERS AND BLADDER: Mild left hydronephrosis. The left ureter is difficult to follow, but no obstructing stones seen. 1.3 cm simple appearing cyst in the mid pole of the left kidney. No suspicious renal abnormality.  No stones in the right kidney. No perinephric or periureteral stranding. Urinary bladder is unremarkable. GI AND BOWEL: Stomach demonstrates no acute abnormality. There is no bowel obstruction. REPRODUCTIVE ORGANS: Large calcification in the central pelvis presumably reflects calcified fibroid. PERITONEUM AND RETROPERITONEUM: Large volume ascites throughout the abdomen and pelvis. No free air. VASCULATURE: Aorta is normal in caliber. Aortic atherosclerosis. ABDOMINAL AND PELVIS LYMPH NODES: No lymphadenopathy. BONES AND SOFT TISSUES: No acute osseous abnormality. No focal soft tissue abnormality. IMPRESSION: 1. Large volume ascites throughout the abdomen and pelvis. 2. Bilateral pleural effusions with compressive lower lobe atelectasis. 3. Mild left hydronephrosis without visible obstructing calculus. Electronically signed by: Franky Crease MD 06/22/2024 08:55 PM EST RP Workstation: [id number]   MR BRAIN WO CONTRAST Result Date: 06/22/2024 EXAM: MRI BRAIN WITHOUT CONTRAST 06/22/2024 03:56:29 PM TECHNIQUE: Multiplanar multisequence MRI of the head/brain was performed without the administration of intravenous contrast. COMPARISON: Same day CT head and MRI head 05/12/2024. CLINICAL HISTORY: Stroke, follow up. FINDINGS: BRAIN AND VENTRICLES: There is a focus of acute infarct within the posterior left cerebellum involving a portion of the left PICA territory. There are numerous additional scattered areas of infarct involving the cortex and subcortical white matter within the bilateral frontal and parietal lobes. Additional areas of more ill-defined diffusion signal abnormality in the cortex primarily of the right middle frontal gyrus and in the left middle frontal gyrus without definite restricted diffusion which may reflect areas of subacute infarct. There are scattered and confluent areas of T2 and FLAIR hyperintensity in the periventricular and subcortical white matter with additional areas of signal abnormality in  the pons compatible with extensive chronic microvascular ischemic changes. There are remote lacunar infarcts in the bilateral basal ganglia and thalami. Additional areas of remote cortical infarct in the lateral right temporal lobe and in the bilateral occipital lobes. Additional regions of remote infarct and prior petechial hemorrhage in the right frontal lobe. Redemonstrated now remote infarcts in the right cerebellum. No acute intracranial hemorrhage. No mass. No midline shift. No hydrocephalus. The sella is unremarkable. Normal flow voids. ORBITS: No acute abnormality. SINUSES AND MASTOIDS: No acute abnormality. BONES AND SOFT TISSUES: Normal marrow  signal. No acute soft tissue abnormality. IMPRESSION: 1. Acute infarct in the posterior left cerebellum involving a portion of the left PICA territory. 2. Numerous additional scattered acute cortical and subcortical infarcts in the bilateral frontal and parietal lobes which are new from prior. 3. Additional bilateral subacute cortical infarcts corresponding to areas of infarct on the prior MRI. 4. Extensive chronic microvascular ischemic changes and remote infarcts as above. Electronically signed by: Donnice Mania MD 06/22/2024 04:20 PM EST RP Workstation: [id number]   CT Head Wo Contrast Result Date: 06/22/2024 EXAM: CT HEAD WITHOUT CONTRAST 06/22/2024 12:33:00 PM TECHNIQUE: CT of the head was performed without the administration of intravenous contrast. Automated exposure control, iterative reconstruction, and/or weight based adjustment of the mA/kV was utilized to reduce the radiation dose to as low as reasonably achievable. COMPARISON: CT head 05/12/2024 CLINICAL HISTORY: Headache, new onset (Age >= 51y) FINDINGS: BRAIN AND VENTRICLES: No acute hemorrhage. Since October 15, new/interval acute or subacute left cerebellar infarct. Additional remote bilateral cerebellar infarcts. Remote left thalamic lacunar infarct. Remote right posterior temporal infarct.  Patchy white matter hypodensities, compatible with chronic microvascular ischemic changes. No hydrocephalus. No extra-axial collection. No mass effect or midline shift. ORBITS: No acute abnormality. SINUSES: No acute abnormality. SOFT TISSUES AND SKULL: No skull fracture. IMPRESSION: 1. Since October 15, new/interval acute or subacute left cerebellar infarct. Recommend MRI head. 2. Additional remote infarcts and chronic microvascular ischemic change. Electronically signed by: Gilmore Molt MD 06/22/2024 12:40 PM EST RP Workstation: [id number]   ECHO TEE Result Date: 06/16/2024    TRANSESOPHOGEAL ECHO REPORT   [name] Date of Exam: 06/16/2024 Medical Rec #:  [id number]       Height:       64.0 in Accession #:    [id number]      Weight:       110.1 lb Date of Birth:  [birth year]      BSA:          1.518 m Patient Age:    77 years        BP:           142/83 mmHg Patient Gender: F               HR:           100 bpm. Exam Location:  Outpatient Procedure: Transesophageal Echo, Cardiac Doppler, Color Doppler and 3D Echo            (Both Spectral and Color Flow Doppler were utilized during            procedure). Indications:     stroke  History:         Patient has prior history of Echocardiogram examinations, most                  recent 05/13/2024. Risk Factors:Hypertension and Dyslipidemia.  Sonographer:     Tinnie Barefoot RDCS Referring Phys:  [id number] MAUDE JAYSON EMMER Diagnosing Phys: Maude Emmer MD PROCEDURE: After discussion of the risks and benefits of a TEE, an informed consent was obtained from the patient. The transesophogeal probe was passed without difficulty through the esophogus of the patient. Imaged were obtained with the patient in a left lateral decubitus position. Sedation performed by different physician. The patient was monitored while under deep sedation. Anesthestetic sedation was provided intravenously by Anesthesiology: 97mg  of Propofol , 60mg  of Lidocaine . The patient's  vital  signs; including heart rate, blood pressure, and oxygen saturation; remained stable  throughout the procedure. The patient developed no complications during the procedure.  IMPRESSIONS  1. Left ventricular ejection fraction, by estimation, is 60 to 65%. The left ventricle has normal function. The left ventricle has no regional wall motion abnormalities.  2. Right ventricular systolic function is normal. The right ventricular size is normal.  3. No left atrial/left atrial appendage thrombus was detected.  4. The mitral valve is abnormal. Trivial mitral valve regurgitation. No evidence of mitral stenosis.  5. Nodular calcification of right coronay cusp adjacent to the non cusp commisure. I do not think this represents a papilloma . The aortic valve is normal in structure. There is moderate calcification of the aortic valve. Aortic valve regurgitation is not visualized. Aortic valve sclerosis is present, with no evidence of aortic valve stenosis.  6. The inferior vena cava is normal in size with greater than 50% respiratory variability, suggesting right atrial pressure of 3 mmHg.  7. Agitated saline contrast bubble study was negative, with no evidence of any interatrial shunt.  8. 3D performed of the aortic valve and demonstrates 3D of AV done. Conclusion(s)/Recommendation(s): Normal biventricular function without evidence of hemodynamically significant valvular heart disease. FINDINGS  Left Ventricle: Left ventricular ejection fraction, by estimation, is 60 to 65%. The left ventricle has normal function. The left ventricle has no regional wall motion abnormalities. The left ventricular internal cavity size was normal in size. There is  no left ventricular hypertrophy. Right Ventricle: The right ventricular size is normal. No increase in right ventricular wall thickness. Right ventricular systolic function is normal. Left Atrium: Left atrial size was normal in size. No left atrial/left atrial appendage thrombus  was detected. Right Atrium: Right atrial size was normal in size. Pericardium: There is no evidence of pericardial effusion. Mitral Valve: The mitral valve is abnormal. There is mild thickening of the mitral valve leaflet(s). Trivial mitral valve regurgitation. No evidence of mitral valve stenosis. Tricuspid Valve: The tricuspid valve is normal in structure. Tricuspid valve regurgitation is not demonstrated. No evidence of tricuspid stenosis. Aortic Valve: Nodular calcification of right coronay cusp adjacent to the non cusp commisure. I do not think this represents a papilloma. The aortic valve is normal in structure. There is moderate calcification of the aortic valve. There is moderate aortic valve annular calcification. Aortic valve regurgitation is not visualized. Aortic valve sclerosis is present, with no evidence of aortic valve stenosis. Pulmonic Valve: The pulmonic valve was normal in structure. Pulmonic valve regurgitation is not visualized. No evidence of pulmonic stenosis. Aorta: The aortic root is normal in size and structure. Venous: The inferior vena cava is normal in size with greater than 50% respiratory variability, suggesting right atrial pressure of 3 mmHg. IAS/Shunts: No atrial level shunt detected by color flow Doppler. Agitated saline contrast was given intravenously to evaluate for intracardiac shunting. Agitated saline contrast bubble study was negative, with no evidence of any interatrial shunt. Additional Comments: 3D was performed not requiring image post processing on an independent workstation and was normal. Maude Emmer MD Electronically signed by Maude Emmer MD Signature Date/Time: 06/16/2024/11:55:06 AM    Final    EP STUDY Result Date: 06/16/2024 See surgical note for result.   Labs:  CBC: Recent Labs    06/22/24 1251 06/23/24 0128 06/24/24 0230 06/30/24 1140  WBC 7.5 7.8 7.3 7.3  HGB 11.6* 10.4* 10.5* 11.4  HCT 35.4* 31.6* 31.3* 35.6  PLT 579* 538* 527* 735*     COAGS: Recent Labs    06/22/24 1813  INR 1.0    BMP: Recent Labs    05/16/24 0700 05/17/24 0546 06/22/24 1251 06/23/24 0128 06/24/24 0230 06/30/24 1140  NA 129*   < > 130* 132* 131* 133*  K 4.3   < > 4.1 3.7 4.6 4.6  CL 94*   < > 95* 97* 96* 94*  CO2 25   < > 27 26 26 24   GLUCOSE 98   < > 100* 102* 101* 123*  BUN 12   < > 8 8 11 12   CALCIUM  8.4*   < > 8.9 8.4* 8.2* 9.3  CREATININE 0.67   < > 0.59 0.63 0.84 0.70  GFRNONAA >60  --  >60 >60 >60  --    < > = values in this interval not displayed.    LIVER FUNCTION TESTS: Recent Labs    05/12/24 1420 06/22/24 1251 06/24/24 0230 06/30/24 1140  BILITOT 0.4 0.5 0.4 <0.2  AST 44* 21 17 19   ALT 18 14 10 13   ALKPHOS 94 81 62 96  PROT 7.2 6.3* 5.1* 6.0  ALBUMIN  4.5 3.1* 2.6* 3.7*    TUMOR MARKERS: No results for input(s): AFPTM, CEA, CA199, CHROMGRNA in the last 8760 hours.  Assessment and Plan: 78 y.o. 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  She is known to IR team from paracentesis on 06/23/2024.  She presents today for Port-A-Cath placement to assist with treatment.Risks and benefits of image guided port-a-catheter placement was discussed with the patient including, but not limited to bleeding, infection, pneumothorax, or fibrin sheath development and need for additional procedures.  All of the patient's questions were answered, patient is agreeable to proceed. Consent signed and in chart.    Thank you for allowing our service to participate in Michele Tyler 's care.  Electronically Signed: D. Franky Rakers, PA-C   07/12/2024, 12:00 PM      I spent a total of  20 minutes   in face to face in clinical consultation, greater than 50% of which was counseling/coordinating care for port a cath placement i

## 2024-07-12 NOTE — Telephone Encounter (Signed)
 Called and reviewed appts this week with caregiver. Moved education appt to Wednesday 12/17 per her request at 2 pm. Reviewed appt details for port tomorrow with arrival at 0730 to [facility]. She is aware of appts.

## 2024-07-12 NOTE — Procedures (Signed)
 Ultrasound-guided therapeutic paracentesis performed yielding 900 cc of yellow  fluid. No immediate complications. EBL none.

## 2024-07-13 ENCOUNTER — Inpatient Hospital Stay (HOSPITAL_COMMUNITY): Admission: RE | Admit: 2024-07-13 | Discharge: 2024-07-13 | Attending: Hematology and Oncology

## 2024-07-13 ENCOUNTER — Encounter (HOSPITAL_COMMUNITY): Payer: Self-pay

## 2024-07-13 DIAGNOSIS — C569 Malignant neoplasm of unspecified ovary: Secondary | ICD-10-CM | POA: Insufficient documentation

## 2024-07-13 DIAGNOSIS — R180 Malignant ascites: Secondary | ICD-10-CM | POA: Insufficient documentation

## 2024-07-13 DIAGNOSIS — Z7982 Long term (current) use of aspirin: Secondary | ICD-10-CM | POA: Diagnosis not present

## 2024-07-13 DIAGNOSIS — I10 Essential (primary) hypertension: Secondary | ICD-10-CM | POA: Diagnosis not present

## 2024-07-13 DIAGNOSIS — Z79899 Other long term (current) drug therapy: Secondary | ICD-10-CM | POA: Insufficient documentation

## 2024-07-13 DIAGNOSIS — Z87891 Personal history of nicotine dependence: Secondary | ICD-10-CM | POA: Insufficient documentation

## 2024-07-13 DIAGNOSIS — F419 Anxiety disorder, unspecified: Secondary | ICD-10-CM | POA: Diagnosis not present

## 2024-07-13 DIAGNOSIS — F32A Depression, unspecified: Secondary | ICD-10-CM | POA: Insufficient documentation

## 2024-07-13 DIAGNOSIS — Z7902 Long term (current) use of antithrombotics/antiplatelets: Secondary | ICD-10-CM | POA: Diagnosis not present

## 2024-07-13 DIAGNOSIS — Z8673 Personal history of transient ischemic attack (TIA), and cerebral infarction without residual deficits: Secondary | ICD-10-CM | POA: Diagnosis not present

## 2024-07-13 HISTORY — PX: IR IMAGING GUIDED PORT INSERTION: IMG5740

## 2024-07-13 MED ORDER — FENTANYL CITRATE (PF) 100 MCG/2ML IJ SOLN
INTRAMUSCULAR | Status: AC
  Filled 2024-07-13: qty 2

## 2024-07-13 MED ORDER — MIDAZOLAM HCL (PF) 2 MG/2ML IJ SOLN
INTRAMUSCULAR | Status: AC | PRN
  Administered 2024-07-13: 10:00:00 1 mg via INTRAVENOUS

## 2024-07-13 MED ORDER — LIDOCAINE-EPINEPHRINE 1 %-1:100000 IJ SOLN
INTRAMUSCULAR | Status: AC
  Filled 2024-07-13: qty 1

## 2024-07-13 MED ORDER — HEPARIN SOD (PORK) LOCK FLUSH 100 UNIT/ML IV SOLN
INTRAVENOUS | Status: AC
  Filled 2024-07-13: qty 5

## 2024-07-13 MED ORDER — FENTANYL CITRATE (PF) 100 MCG/2ML IJ SOLN
INTRAMUSCULAR | Status: AC | PRN
  Administered 2024-07-13: 10:00:00 50 ug via INTRAVENOUS

## 2024-07-13 MED ORDER — MIDAZOLAM HCL 2 MG/2ML IJ SOLN
INTRAMUSCULAR | Status: AC
  Filled 2024-07-13: qty 2

## 2024-07-13 MED ORDER — LIDOCAINE-EPINEPHRINE 1 %-1:100000 IJ SOLN
20.0000 mL | Freq: Once | INTRAMUSCULAR | Status: AC
  Administered 2024-07-13: 10:00:00 17 mL via INTRADERMAL

## 2024-07-13 MED ORDER — HEPARIN SOD (PORK) LOCK FLUSH 100 UNIT/ML IV SOLN
500.0000 [IU] | Freq: Once | INTRAVENOUS | Status: AC
  Administered 2024-07-13: 10:00:00 500 [IU] via INTRAVENOUS

## 2024-07-13 MED ORDER — SODIUM CHLORIDE 0.9 % IV SOLN: INTRAVENOUS | Status: DC

## 2024-07-13 NOTE — Progress Notes (Signed)
 Pharmacist Chemotherapy Monitoring - Initial Assessment    Anticipated start date: 07/19/24   The following has been reviewed per standard work regarding the patient's treatment regimen: The patient's diagnosis, treatment plan and drug doses, and organ/hematologic function Lab orders and baseline tests specific to treatment regimen  The treatment plan start date, drug sequencing, and pre-medications Prior authorization status  Patient's documented medication list, including drug-drug interaction screen and prescriptions for anti-emetics and supportive care specific to the treatment regimen The drug concentrations, fluid compatibility, administration routes, and timing of the medications to be used The patient's access for treatment and lifetime cumulative dose history, if applicable  The patient's medication allergies and previous infusion related reactions, if applicable   Changes made to treatment plan:  N/A  Follow up needed:  N/A  Harlene JONELLE Nasuti, RPH, 07/13/2024  12:15 PM

## 2024-07-13 NOTE — Discharge Instructions (Signed)
 Implanted Port Insertion, Care After The following information offers guidance on how to care for yourself after your procedure. Your health care provider may also give you more specific instructions. If you have problems or questions, contact your health care provider. What can I expect after the procedure? After the procedure, it is common to have: Discomfort at the port insertion site. Bruising on the skin over the port. This should improve over 3-4 days. Follow these instructions at home: [facility] care After your port is placed, you will get a manufacturer's information card. The card has information about your port. Keep this card with you at all times. Take care of the port as told by your health care provider. Ask your health care provider if you or a family member can get training for taking care of the port at home. A home health care nurse will be be available to help care for the port. Make sure to remember what type of port you have. Incision care  Follow instructions from your health care provider about how to take care of your port insertion site. Make sure you: Wash your hands with soap and water for at least 20 seconds before and after you change your bandage (dressing). If soap and water are not available, use hand sanitizer. Change your dressing as told by your health care provider. Leave stitches (sutures), skin glue, or adhesive strips in place. These skin closures may need to stay in place for 2 weeks or longer. If adhesive strip edges start to loosen and curl up, you may trim the loose edges. Do not remove adhesive strips completely unless your health care provider tells you to do that. Check your port insertion site every day for signs of infection. Check for: Redness, swelling, or pain. Fluid or blood. Warmth. Pus or a bad smell. Activity Return to your normal activities as told by your health care provider. Ask your health care provider what activities are safe for  you. You may have to avoid lifting. Ask your health care provider how much you can safely lift. General instructions Take over-the-counter and prescription medicines only as told by your health care provider. Do not take baths, swim, or use a hot tub until your health care provider approves. Ask your health care provider if you may take showers. You may only be allowed to take sponge baths. If you were given a sedative during the procedure, it can affect you for several hours. Do not drive or operate machinery until your health care provider says that it is safe. Wear a medical alert bracelet in case of an emergency. This will tell any health care providers that you have a port. Keep all follow-up visits. This is important. Contact a health care provider if: You cannot flush your port with saline as directed, or you cannot draw blood from the port. You have a fever or chills. You have redness, swelling, or pain around your port insertion site. You have fluid or blood coming from your port insertion site. Your port insertion site feels warm to the touch. You have pus or a bad smell coming from the port insertion site. Get help right away if: You have chest pain or shortness of breath. You have bleeding from your port that you cannot control. These symptoms may be an emergency. Get help right away. Call 911. Do not wait to see if the symptoms will go away. Do not drive yourself to the hospital. Summary Take care of the port as told  by your health care provider. Keep the manufacturer's information card with you at all times. Change your dressing as told by your health care provider. Contact a health care provider if you have a fever or chills or if you have redness, swelling, or pain around your port insertion site. Keep all follow-up visits. This information is not intended to replace advice given to you by your health care provider. Make sure you discuss any questions you have with your  health care provider. Document Revised: 01/16/2021 Document Reviewed: 01/16/2021 Elsevier Patient Education  2024 Elsevier Inc.Moderate Conscious Sedation-Care After  This sheet gives you information about how to care for yourself after your procedure. Your health care provider may also give you more specific instructions. If you have problems or questions, contact your health care provider.  After the procedure, it is common to have: Sleepiness for several hours. Impaired judgment for several hours. Difficulty with balance. Vomiting if you eat too soon.  Follow these instructions at home:  Rest. Do not participate in activities where you could fall or become injured. Do not drive or use machinery. Do not drink alcohol. Do not take sleeping pills or medicines that cause drowsiness. Do not make important decisions or sign legal documents. Do not take care of children on your own.  Eating and drinking Follow the diet recommended by your health care provider. Drink enough fluid to keep your urine pale yellow. If you vomit: Drink water, juice, or soup when you can drink without vomiting. Make sure you have little or no nausea before eating solid foods.  General instructions Take over-the-counter and prescription medicines only as told by your health care provider. Have a responsible adult stay with you for the time you are told. It is important to have someone help care for you until you are awake and alert. Do not smoke. Keep all follow-up visits as told by your health care provider. This is important.  Contact a health care provider if: You are still sleepy or having trouble with balance after 24 hours. You feel light-headed. You keep feeling nauseous or you keep vomiting. You develop a rash. You have a fever. You have redness or swelling around the IV site.  Get help right away if: You have trouble breathing. You have new-onset confusion at home.  This information is  not intended to replace advice given to you by your health care provider. Make sure you discuss any questions you have with your healthcare provider.

## 2024-07-13 NOTE — Procedures (Signed)
 Interventional Radiology Procedure Note  Procedure: Single Lumen Power Port Placement    Access:  Right internal jugular vein  Findings: Catheter tip positioned at cavoatrial junction. Port is ready for immediate use.   Complications: None  EBL: < 10 mL  Recommendations:  - Ok to shower in 24 hours - Do not submerge for 7 days - Routine line care    Ester Sides, MD

## 2024-07-14 ENCOUNTER — Inpatient Hospital Stay: Admitting: Pharmacist

## 2024-07-14 ENCOUNTER — Inpatient Hospital Stay

## 2024-07-14 ENCOUNTER — Other Ambulatory Visit: Payer: Self-pay | Admitting: *Deleted

## 2024-07-14 DIAGNOSIS — C569 Malignant neoplasm of unspecified ovary: Secondary | ICD-10-CM

## 2024-07-15 NOTE — Progress Notes (Unsigned)
 GYNECOLOGIC ONCOLOGY NEW PATIENT CONSULTATION   Patient Name: Michele Tyler  Patient Age: 78 y.o. Date of Service: 07/16/2024 Referring Provider: [name], MD [address] [place],  [place] [address]   Primary Care Provider: [name], MD Consulting Provider: Comer Dollar, MD   Assessment/Plan:  Postmenopausal patient with metastatic gynecologic cancer.  I discussed that I believe she likely has stage *** ovarian cancer. I discussed that the treatment approach for this disease is typically combination of cytoreductive surgery and chemotherapy. I discussed that sequencing of this can be either with upfront debulking followed by adjuvant chemotherapy sequentially or neoadjuvant chemotherapy followed by an interval cytoreductive attempt, then additional chemotherapy. This latter approach is associated with a reduced perioperative morbidity at the time of surgery.  I discussed that decisions regarding sequencing of therapy is individualized taking into account individual patient health, in addition to the apparent tumor distribution on imaging, and likelihood of complete surgical resection at the time of surgery. I discussed that the overall survival observed in patients is equivalent for both approaches (neoadjuvant chemotherapy versus primary debulking surgery) provided that there is an optimal cytoreductive effort at the time of surgery (regardless of the timing of that surgery). Given the observed decreased morbidity of neoadjuvant chemotherapy, with preserved survival outcomes, I favor this approach for her.   We spent some time discussing the typical treatment for gynecologic cancer.  On CT scan, there is not a clear source for her cancer.  We reviewed the limitations of CT scan and looking at the pelvic organs.  She has a calcified mass that is presumed to be a fibroid.  The patient endorses a history of a known uterine fibroid.  Discussed getting a pelvic ultrasound to see if  we can better characterize pelvic anatomy.  Unfortunately, based on her exam today, transvaginal ultrasound will not be possible.  We will move forward with an abdominal ultrasound.  If we do not see pelvic organs well enough, we can schedule her for a pelvic MRI.  We discussed getting CEA and CA 19-9 at the time of her next lab visit.  ***  The office will reach out to her neurologist to ask about when it would be safe for her to interrupt Plavix  in the future for possible debulking surgery.  A copy of this note was sent to the patient's referring provider.   65 minutes of total time was spent for this patient encounter, including preparation, face-to-face counseling with the patient and coordination of care, and documentation of the encounter.  Comer Dollar, MD  Division of Gynecologic Oncology  Department of Obstetrics and Gynecology  [facility]  ___________________________________________  Chief Complaint: No chief complaint on file.   History of Present Illness:  Michele Tyler is a 78 y.o. y.o. female who is seen in consultation at the request of [name], MD for an evaluation of malignant ascites.  The patient was recently admitted after developing headache and pain behind her left eye and found to have an acute cerebellar infarct on MRI.  She was started on baby aspirin  and Plavix .  More recent hospitalization with symptoms again and imaging showing acute stroke.  During this hospitalization, significant ascites was noted.  She underwent imaging as discussed below and ultimately a diagnostic and therapeutic paracentesis.  06/22/2024: CT of the chest, abdomen, pelvis shows bilateral pleural effusions with compressive atelectasis in lower lobes.  Scattered cystic areas in the liver, largest measuring 1.7 cm that appear  benign.  Mild left hydronephrosis.  1.3 cm simple appearing cyst in the midpole of the left kidney.  Large calcification in the  central pelvis presumably reflects calcified fibroid.  Large volume ascites throughout the abdomen and pelvis without free air.  No adenopathy. 06/23/2024: Paracentesis performed with removal of 3L.  Cytology from this showed malignant cells that are CK7, PAX8, and patchy ER positive.  Negative for TTF-1, GA TA 3, calretinin, Napsin A, CDX2, and CK20.  IHC for p16 and p53 shows overexpression but definitive clonal pattern not present.  Patient met with Dr. Lonn on 07/09/2024.  Repeat paracentesis was performed on 12/15 (900cc removed). Port was placed on 07/13/2024.  He is scheduled to have her first infusion on 12/22.  Patient comes in today with her friend and caretaker April.  She reports overall doing well.  She has occasional discomfort on her right side.  Only noticed distention during her most recent hospitalization.  Felt some relief after paracentesis.  She has been struggling with constipation, using senna.  Plan is to start lactulose  after her appointment today.  Constipation has been an issue since her first hospitalization.  She denies any urinary symptoms.  Endorses a good appetite, reports weight has been more or less stable.  Has started nutritional supplementation.  With regard to her recent strokes, she is still having some issues with movement including balance in the process of doing certain movements (like stepping on and off a scale).  Is not having any speech issues but still having some processing issues like at times working and I phone or using a TV remote.  She has had speech therapy once, approximately 5 visits with occupational surgery since the end of October.  PAST MEDICAL HISTORY:  Past Medical History:  Diagnosis Date   Anxiety    Depression    Hypertension    Stroke (cerebrum) [facility])      PAST SURGICAL HISTORY:  Past Surgical History:  Procedure Laterality Date   DILATION AND CURETTAGE OF UTERUS  2002   FACELIFT  2005   IR IMAGING GUIDED PORT INSERTION   07/13/2024   IR PARACENTESIS  06/23/2024   TRANSESOPHAGEAL ECHOCARDIOGRAM (CATH LAB) N/A 06/16/2024   Procedure: TRANSESOPHAGEAL ECHOCARDIOGRAM;  Surgeon: Delford Maude BROCKS, MD;  Location: [facility] INVASIVE CV LAB;  Service: Cardiovascular;  Laterality: N/A;    OB/GYN HISTORY:  OB History  Gravida Para Term Preterm AB Living  9 9    9   SAB IAB Ectopic Multiple Live Births          # Outcome Date GA Lbr Len/2nd Weight Sex Type Anes PTL Lv  9 Para           8 Para           7 Para           6 Para           5 Para           4 Para           3 Para           2 Para           1 Para             No LMP recorded. Patient is postmenopausal.  Age at menarche: unsure  Age at menopause: unsure, denies any postmenopausal bleeding Hx of HRT: denies Hx of STDs: denies Last pap: unsure History of abnormal pap smears: denies  SCREENING  STUDIES:  Last mammogram: 2024  Last colonoscopy: has never had  MEDICATIONS: Outpatient Encounter Medications as of 07/16/2024  Medication Sig   artificial tears ophthalmic solution Place 1 drop into both eyes as needed for dry eyes.   Ascorbic Acid (VITAMIN C CR) 1500 MG TBCR Take 1,500 mg by mouth daily.   [EXPIRED] aspirin  EC 81 MG tablet Take 1 tablet (81 mg total) by mouth daily for 21 days. Swallow whole.   atorvastatin  (LIPITOR) 10 MG tablet TAKE 1 TABLET BY MOUTH AT  BEDTIME   B Complex-Folic Acid  (BALANCED B-150 PO) Take 1 capsule by mouth daily.   busPIRone  (BUSPAR ) 7.5 MG tablet TAKE 1 TABLET BY MOUTH 3 TIMES  DAILY (Patient taking differently: Take 7.5 mg by mouth at bedtime.)   CALCIUM  MAGNESIUM ZINC PO Take 1 tablet by mouth in the morning.   Cholecalciferol  (VITAMIN D3) 5000 units CAPS Take 5,000 Units by mouth daily with breakfast.   clopidogrel  (PLAVIX ) 75 MG tablet Take 1 tablet (75 mg total) by mouth daily.   Cyanocobalamin  (B-12) 1000 MCG CAPS Take 1,000 mcg by mouth in the morning.   dexamethasone  (DECADRON ) 4 MG tablet Take 2 tabs at  the night before and 2 tab the morning of chemotherapy, every 3 weeks, by mouth x 6 cycles   folic acid  (FOLVITE ) 800 MCG tablet Take 800 mcg by mouth daily.   lactulose  (CHRONULAC ) 10 GM/15ML solution Take 15 mLs (10 g total) by mouth 2 (two) times daily as needed for mild constipation. Titrate up as needed for goal of 1 BM per day   lidocaine -prilocaine  (EMLA ) cream Apply to affected area once   mirtazapine  (REMERON ) 7.5 MG tablet Take 1 tablet (7.5 mg total) by mouth at bedtime.   Omega-3 Fatty Acids (OMEGA-3 FISH OIL PO) Take 4,000 mg by mouth daily.   ondansetron  (ZOFRAN ) 4 MG tablet Take 1 tablet (4 mg total) by mouth every 8 (eight) hours as needed for nausea or vomiting.   ondansetron  (ZOFRAN ) 8 MG tablet Take 1 tablet (8 mg total) by mouth every 8 (eight) hours as needed for nausea or vomiting. Start on the third day after carboplatin.   oxyCODONE  (OXY IR/ROXICODONE ) 5 MG immediate release tablet Take 1 tablet (5 mg total) by mouth every 4 (four) hours as needed for severe pain (pain score 7-10) or breakthrough pain.   polyethylene glycol powder (GLYCOLAX /MIRALAX ) 17 GM/SCOOP powder Take 17 g by mouth daily as needed for mild constipation. Dissolve 1 capful (17g) in 4-8 ounces of liquid and take by mouth daily.   prochlorperazine  (COMPAZINE ) 10 MG tablet Take 1 tablet (10 mg total) by mouth every 6 (six) hours as needed for nausea or vomiting.   promethazine  (PHENERGAN ) 25 MG tablet Take 1 tablet (25 mg total) by mouth every 6 (six) hours as needed for nausea or vomiting.   spironolactone  (ALDACTONE ) 100 MG tablet Take 1 tablet (100 mg total) by mouth daily.   vitamin A 8000 UNIT capsule Take 8,000 Units by mouth daily.   VITAMIN E-1000 PO Take 1,000 Units by mouth daily.   Vitamins-Lipotropics (BALANCED B-150 COMPLEX TR PO) Take 1 tablet by mouth See admin instructions. Take 1 tablet by mouth once a day (contains b-1 @ 150 mg, b-2 150 mg, b-6 150 mg, b-12 @ 150 mg, and niacin 150 mg)   No  facility-administered encounter medications on file as of 07/16/2024.    ALLERGIES:  Allergies[1]   FAMILY HISTORY:  Family History  Problem Relation Age  of Onset   Hypertension Mother    Depression Father    Hypertension Father      SOCIAL HISTORY:  Social Connections: Moderately Isolated (06/22/2024)   Social Connection and Isolation Panel    Frequency of Communication with Friends and Family: Three times a week    Frequency of Social Gatherings with Friends and Family: Once a week    Attends Religious Services: Never    Database Administrator or Organizations: Yes    Attends Engineer, Structural: 1 to 4 times per year    Marital Status: Divorced    REVIEW OF SYSTEMS:  + Leg swelling, abdominal pain, distention, constipation, anxiety, depression, confusion Denies appetite changes, fevers, chills, fatigue, unexplained weight changes. Denies hearing loss, neck lumps or masses, mouth sores, ringing in ears or voice changes. Denies cough or wheezing.  Denies shortness of breath. Denies chest pain or palpitations.  Denies blood in stools, diarrhea, nausea, vomiting, or early satiety. Denies pain with intercourse, dysuria, frequency, hematuria or incontinence. Denies hot flashes, pelvic pain, vaginal bleeding or vaginal discharge.   Denies joint pain, back pain or muscle pain/cramps. Denies itching, rash, or wounds. Denies dizziness, headaches, numbness or seizures. Denies swollen lymph nodes or glands, denies easy bruising or bleeding. Denies decreased concentration.  Physical Exam:  Vital Signs for this encounter:  Blood pressure 128/74, pulse 98, temperature 98.1 F (36.7 C), temperature source Oral, resp. rate 19, height 5' 4 (1.626 m), weight 109 lb 6.4 oz (49.6 kg), SpO2 97%. Body mass index is 18.78 kg/m. General: Alert, oriented, no acute distress.  HEENT: Normocephalic, atraumatic. Sclera anicteric.  Chest: Decreased breath sounds at lung bases,  otherwise clear to auscultation bilaterally. No wheezes, rhonchi, or rales. Cardiovascular: Rate in the 90s, regular rhythm, no murmurs, rubs, or gallops.  Abdomen: Normoactive bowel sounds. Soft, mildly distended, nontender to palpation. No masses or hepatosplenomegaly appreciated. + palpable fluid wave.  Extremities: Grossly normal range of motion. Warm, well perfused. 1-2+ edema bilaterally.  Skin: No rashes or lesions.  Lymphatics: No cervical, supraclavicular, or inguinal adenopathy.  GU: Atrophy of the external female genitalia with almost complete agglutination of the vagina.  There is a 1 cm opening at the superior aspect of the vagina.  Unable to pass either a small speculum or 1 finger into this.  No lesions. No discharge or bleeding.             Bladder/urethra:  unable to assess             Vagina: unable to assess             Cervix: unable to assess             Uterus: unable to assess             Adnexa: unable to assess  LABORATORY AND RADIOLOGIC DATA:  Outside medical records were reviewed to synthesize the above history, along with the history and physical obtained during the visit.   Lab Results  Component Value Date   WBC 6.5 07/16/2024   HGB 11.4 (L) 07/16/2024   HCT 33.5 (L) 07/16/2024   PLT 566 (H) 07/16/2024   GLUCOSE 97 07/16/2024   CHOL 128 06/23/2024   TRIG 55 06/23/2024   HDL 59 06/23/2024   LDLCALC 58 06/23/2024   ALT 12 07/16/2024   AST 21 07/16/2024   NA 137 07/16/2024   K 3.5 07/16/2024   CL 101 07/16/2024   CREATININE 0.62 07/16/2024  BUN 16 07/16/2024   CO2 27 07/16/2024   TSH 0.817 06/22/2024   INR 1.0 06/22/2024   HGBA1C 5.5 05/12/2024       [1]  Allergies Allergen Reactions   Codeine Nausea Only

## 2024-07-16 ENCOUNTER — Inpatient Hospital Stay

## 2024-07-16 ENCOUNTER — Other Ambulatory Visit: Payer: Self-pay | Admitting: Family Medicine

## 2024-07-16 ENCOUNTER — Inpatient Hospital Stay (HOSPITAL_BASED_OUTPATIENT_CLINIC_OR_DEPARTMENT_OTHER): Admitting: Gynecologic Oncology

## 2024-07-16 ENCOUNTER — Encounter: Payer: Self-pay | Admitting: Gynecologic Oncology

## 2024-07-16 VITALS — BP 128/74 | HR 98 | Temp 98.1°F | Resp 19 | Ht 64.0 in | Wt 109.4 lb

## 2024-07-16 DIAGNOSIS — C579 Malignant neoplasm of female genital organ, unspecified: Secondary | ICD-10-CM | POA: Diagnosis not present

## 2024-07-16 DIAGNOSIS — R140 Abdominal distension (gaseous): Secondary | ICD-10-CM

## 2024-07-16 DIAGNOSIS — Z5111 Encounter for antineoplastic chemotherapy: Secondary | ICD-10-CM | POA: Diagnosis not present

## 2024-07-16 DIAGNOSIS — C569 Malignant neoplasm of unspecified ovary: Secondary | ICD-10-CM

## 2024-07-16 DIAGNOSIS — R978 Other abnormal tumor markers: Secondary | ICD-10-CM

## 2024-07-16 DIAGNOSIS — I639 Cerebral infarction, unspecified: Secondary | ICD-10-CM | POA: Diagnosis not present

## 2024-07-16 LAB — CBC WITH DIFFERENTIAL (CANCER CENTER ONLY)
Abs Immature Granulocytes: 0.02 K/uL (ref 0.00–0.07)
Basophils Absolute: 0 K/uL (ref 0.0–0.1)
Basophils Relative: 1 %
Eosinophils Absolute: 0 K/uL (ref 0.0–0.5)
Eosinophils Relative: 0 %
HCT: 33.5 % — ABNORMAL LOW (ref 36.0–46.0)
Hemoglobin: 11.4 g/dL — ABNORMAL LOW (ref 12.0–15.0)
Immature Granulocytes: 0 %
Lymphocytes Relative: 6 %
Lymphs Abs: 0.4 K/uL — ABNORMAL LOW (ref 0.7–4.0)
MCH: 27.3 pg (ref 26.0–34.0)
MCHC: 34 g/dL (ref 30.0–36.0)
MCV: 80.1 fL (ref 80.0–100.0)
Monocytes Absolute: 0.5 K/uL (ref 0.1–1.0)
Monocytes Relative: 8 %
Neutro Abs: 5.6 K/uL (ref 1.7–7.7)
Neutrophils Relative %: 85 %
Platelet Count: 566 K/uL — ABNORMAL HIGH (ref 150–400)
RBC: 4.18 MIL/uL (ref 3.87–5.11)
RDW: 13.2 % (ref 11.5–15.5)
WBC Count: 6.5 K/uL (ref 4.0–10.5)
nRBC: 0 % (ref 0.0–0.2)

## 2024-07-16 LAB — CMP (CANCER CENTER ONLY)
ALT: 12 U/L (ref 0–44)
AST: 21 U/L (ref 15–41)
Albumin: 3.8 g/dL (ref 3.5–5.0)
Alkaline Phosphatase: 102 U/L (ref 38–126)
Anion gap: 10 (ref 5–15)
BUN: 16 mg/dL (ref 8–23)
CO2: 27 mmol/L (ref 22–32)
Calcium: 9 mg/dL (ref 8.9–10.3)
Chloride: 101 mmol/L (ref 98–111)
Creatinine: 0.62 mg/dL (ref 0.44–1.00)
GFR, Estimated: >60 mL/min
Glucose, Bld: 97 mg/dL (ref 70–99)
Potassium: 3.5 mmol/L (ref 3.5–5.1)
Sodium: 137 mmol/L (ref 135–145)
Total Bilirubin: 0.3 mg/dL (ref 0.0–1.2)
Total Protein: 6.4 g/dL — ABNORMAL LOW (ref 6.5–8.1)

## 2024-07-16 MED FILL — Fosaprepitant Dimeglumine For IV Infusion 150 MG (Base Eq): INTRAVENOUS | Qty: 5 | Status: AC

## 2024-07-16 NOTE — Patient Instructions (Signed)
 It was very nice to meet you today.  Given your pelvic exam, a transvaginal ultrasound will not be possible, so we will try a transabdominal ultrasound.  This will help us  get a little bit better view of your pelvic organs.  I have added several other blood test to be done that are tumor markers the next time you are here for lab work.  My office will reach out to your neurologist to inquire about when it would be safe for you to come off of Plavix  for surgery in the future.  Will plan to see you back after your repeat CT scan, which will be done once you have completed 3 cycles or rounds of chemotherapy.

## 2024-07-17 LAB — CA 125: Cancer Antigen (CA) 125: 5568 U/mL — ABNORMAL HIGH (ref 0.0–38.1)

## 2024-07-19 ENCOUNTER — Inpatient Hospital Stay: Admitting: Licensed Clinical Social Worker

## 2024-07-19 ENCOUNTER — Inpatient Hospital Stay

## 2024-07-19 ENCOUNTER — Telehealth: Payer: Self-pay

## 2024-07-19 ENCOUNTER — Encounter: Payer: Self-pay | Admitting: Hematology and Oncology

## 2024-07-19 VITALS — BP 149/83 | HR 115 | Temp 97.6°F | Resp 18 | Wt 108.0 lb

## 2024-07-19 DIAGNOSIS — C579 Malignant neoplasm of female genital organ, unspecified: Secondary | ICD-10-CM

## 2024-07-19 DIAGNOSIS — Z5111 Encounter for antineoplastic chemotherapy: Secondary | ICD-10-CM | POA: Diagnosis not present

## 2024-07-19 DIAGNOSIS — C569 Malignant neoplasm of unspecified ovary: Secondary | ICD-10-CM

## 2024-07-19 MED ORDER — SODIUM CHLORIDE 0.9% FLUSH: 10.0000 mL | INTRAVENOUS | Status: DC | PRN

## 2024-07-19 MED ORDER — SODIUM CHLORIDE 0.9 % IV SOLN
364.2000 mg | Freq: Once | INTRAVENOUS | Status: AC
  Administered 2024-07-19: 360 mg via INTRAVENOUS
  Filled 2024-07-19: qty 36

## 2024-07-19 MED ORDER — PALONOSETRON HCL INJECTION 0.25 MG/5ML
0.2500 mg | Freq: Once | INTRAVENOUS | Status: AC
  Administered 2024-07-19: 0.25 mg via INTRAVENOUS
  Filled 2024-07-19: qty 5

## 2024-07-19 MED ORDER — DIPHENHYDRAMINE HCL 50 MG/ML IJ SOLN
12.5000 mg | Freq: Once | INTRAMUSCULAR | Status: AC
  Administered 2024-07-19: 12.5 mg via INTRAVENOUS
  Filled 2024-07-19: qty 1

## 2024-07-19 MED ORDER — SODIUM CHLORIDE 0.9 % IV SOLN: INTRAVENOUS | Status: DC

## 2024-07-19 MED ORDER — FAMOTIDINE IN NACL 20-0.9 MG/50ML-% IV SOLN
20.0000 mg | Freq: Once | INTRAVENOUS | Status: AC
  Administered 2024-07-19: 20 mg via INTRAVENOUS
  Filled 2024-07-19: qty 50

## 2024-07-19 MED ORDER — SODIUM CHLORIDE 0.9 % IV SOLN
150.0000 mg | Freq: Once | INTRAVENOUS | Status: AC
  Administered 2024-07-19: 150 mg via INTRAVENOUS
  Filled 2024-07-19: qty 150

## 2024-07-19 MED ORDER — DEXAMETHASONE SOD PHOSPHATE PF 10 MG/ML IJ SOLN
10.0000 mg | Freq: Once | INTRAMUSCULAR | Status: AC
  Administered 2024-07-19: 10 mg via INTRAVENOUS

## 2024-07-19 MED ORDER — SODIUM CHLORIDE 0.9 % IV SOLN
175.0000 mg/m2 | Freq: Once | INTRAVENOUS | Status: AC
  Administered 2024-07-19: 258 mg via INTRAVENOUS
  Filled 2024-07-19: qty 43

## 2024-07-19 NOTE — Telephone Encounter (Signed)
 Copied from CRM #[id number]. Topic: Clinical - Order For Equipment >> Jul 16, 2024  3:36 PM Michele Tyler wrote: Reason for CRM: Is calling to req Medical alert devise in case of a fall. Pt was advised by social worker that this could be covered by insurance. Insurance will not cover it and pt is req to possibly send an order for this or other options to see if pt can qualify for this device. Please advise friend/caregiver April on #[phone]

## 2024-07-19 NOTE — Patient Instructions (Signed)
 [facility] - A DEPT OF [facility]  Discharge Instructions: Thank you for choosing [facility] to provide your oncology and hematology care.   If you have a lab appointment with the Cancer Center, please go directly to the Cancer Center and check in at the registration area.   Wear comfortable clothing and clothing appropriate for easy access to any Portacath or PICC line.   We strive to give you quality time with your provider. You may need to reschedule your appointment if you arrive late (15 or more minutes).  Arriving late affects you and other patients whose appointments are after yours.  Also, if you miss three or more appointments without notifying the office, you may be dismissed from the clinic at the provider's discretion.      For prescription refill requests, have your pharmacy contact our office and allow 72 hours for refills to be completed.    Today you received the following chemotherapy and/or immunotherapy agents: Paclitaxel, Carboplatin      To help prevent nausea and vomiting after your treatment, we encourage you to take your nausea medication as directed.  BELOW ARE SYMPTOMS THAT SHOULD BE REPORTED IMMEDIATELY: *FEVER GREATER THAN 100.4 F (38 C) OR HIGHER *CHILLS OR SWEATING *NAUSEA AND VOMITING THAT IS NOT CONTROLLED WITH YOUR NAUSEA MEDICATION *UNUSUAL SHORTNESS OF BREATH *UNUSUAL BRUISING OR BLEEDING *URINARY PROBLEMS (pain or burning when urinating, or frequent urination) *BOWEL PROBLEMS (unusual diarrhea, constipation, pain near the anus) TENDERNESS IN MOUTH AND THROAT WITH OR WITHOUT PRESENCE OF ULCERS (sore throat, sores in mouth, or a toothache) UNUSUAL RASH, SWELLING OR PAIN  UNUSUAL VAGINAL DISCHARGE OR ITCHING   Items with * indicate a potential emergency and should be followed up as soon as possible or go to the Emergency Department if any problems should occur.  Please show the CHEMOTHERAPY ALERT CARD or  IMMUNOTHERAPY ALERT CARD at check-in to the Emergency Department and triage nurse.  Should you have questions after your visit or need to cancel or reschedule your appointment, please contact [facility] - A DEPT OF [facility]  Dept: [phone]  and follow the prompts.  Office hours are 8:00 a.m. to 4:30 p.m. Monday - Friday. Please note that voicemails left after 4:00 p.m. may not be returned until the following business day.  We are closed weekends and major holidays. You have access to a nurse at all times for urgent questions. Please call the main number to the clinic Dept: [phone] and follow the prompts.   For any non-urgent questions, you may also contact your provider using MyChart. We now offer e-Visits for anyone 62 and older to request care online for non-urgent symptoms. For details visit [email or web address].   Also download the MyChart app! Go to the app store, search "MyChart", open the app, select , and log in with your MyChart username and password.  Paclitaxel Injection What is this medication? PACLITAXEL (PAK li TAX el) treats some types of cancer. It works by slowing down the growth of cancer cells. This medicine may be used for other purposes; ask your health care provider or pharmacist if you have questions. COMMON BRAND NAME(S): Onxol, Taxol What should I tell my care team before I take this medication? They need to know if you have any of these conditions: Heart disease Liver disease Low white blood cell levels An unusual or allergic reaction to paclitaxel, other medications,  foods, dyes, or preservatives If you or your partner are pregnant or trying to get pregnant Breast-feeding How should I use this medication? This medication is injected into a vein. It is given by your care team in a hospital or clinic setting. Talk to your care team about the use of this medication in children. While it may be given to children  for selected conditions, precautions do apply. Overdosage: If you think you have taken too much of this medicine contact a poison control center or emergency room at once. NOTE: This medicine is only for you. Do not share this medicine with others. What if I miss a dose? Keep appointments for follow-up doses. It is important not to miss your dose. Call your care team if you are unable to keep an appointment. What may interact with this medication? Do not take this medication with any of the following: Live virus vaccines Other medications may affect the way this medication works. Talk with your care team about all of the medications you take. They may suggest changes to your treatment plan to lower the risk of side effects and to make sure your medications work as intended. This list may not describe all possible interactions. Give your health care provider a list of all the medicines, herbs, non-prescription drugs, or dietary supplements you use. Also tell them if you smoke, drink alcohol, or use illegal drugs. Some items may interact with your medicine. What should I watch for while using this medication? Your condition will be monitored carefully while you are receiving this medication. You may need blood work while taking this medication. This medication may make you feel generally unwell. This is not uncommon as chemotherapy can affect healthy cells as well as cancer cells. Report any side effects. Continue your course of treatment even though you feel ill unless your care team tells you to stop. This medication can cause serious allergic reactions. To reduce the risk, your care team may give you other medications to take before receiving this one. Be sure to follow the directions from your care team. This medication may increase your risk of getting an infection. Call your care team for advice if you get a fever, chills, sore throat, or other symptoms of a cold or flu. Do not treat yourself. Try  to avoid being around people who are sick. This medication may increase your risk to bruise or bleed. Call your care team if you notice any unusual bleeding. Be careful brushing or flossing your teeth or using a toothpick because you may get an infection or bleed more easily. If you have any dental work done, tell your dentist you are receiving this medication. Talk to your care team if you may be pregnant. Serious birth defects can occur if you take this medication during pregnancy. Talk to your care team before breastfeeding. Changes to your treatment plan may be needed. What side effects may I notice from receiving this medication? Side effects that you should report to your care team as soon as possible: Allergic reactions--skin rash, itching, hives, swelling of the face, lips, tongue, or throat Heart rhythm changes--fast or irregular heartbeat, dizziness, feeling faint or lightheaded, chest pain, trouble breathing Increase in blood pressure Infection--fever, chills, cough, sore throat, wounds that don't heal, pain or trouble when passing urine, general feeling of discomfort or being unwell Low blood pressure--dizziness, feeling faint or lightheaded, blurry vision Low red blood cell level--unusual weakness or fatigue, dizziness, headache, trouble breathing Painful swelling, warmth, or redness  of the skin, blisters or sores at the infusion site Pain, tingling, or numbness in the hands or feet Slow heartbeat--dizziness, feeling faint or lightheaded, confusion, trouble breathing, unusual weakness or fatigue Unusual bruising or bleeding Side effects that usually do not require medical attention (report to your care team if they continue or are bothersome): Diarrhea Hair loss Joint pain Loss of appetite Muscle pain Nausea Vomiting This list may not describe all possible side effects. Call your doctor for medical advice about side effects. You may report side effects to FDA at  1-800-FDA-1088. Where should I keep my medication? This medication is given in a hospital or clinic. It will not be stored at home. NOTE: This sheet is a summary. It may not cover all possible information. If you have questions about this medicine, talk to your doctor, pharmacist, or health care provider.  2024 Elsevier/Gold Standard (2021-12-04 00:00:00)  Carboplatin Injection What is this medication? CARBOPLATIN (KAR boe pla tin) treats some types of cancer. It works by slowing down the growth of cancer cells. This medicine may be used for other purposes; ask your health care provider or pharmacist if you have questions. COMMON BRAND NAME(S): Paraplatin What should I tell my care team before I take this medication? They need to know if you have any of these conditions: Blood disorders Hearing problems Kidney disease Recent or ongoing radiation therapy An unusual or allergic reaction to carboplatin, cisplatin, other medications, foods, dyes, or preservatives Pregnant or trying to get pregnant Breast-feeding How should I use this medication? This medication is injected into a vein. It is given by your care team in a hospital or clinic setting. Talk to your care team about the use of this medication in children. Special care may be needed. Overdosage: If you think you have taken too much of this medicine contact a poison control center or emergency room at once. NOTE: This medicine is only for you. Do not share this medicine with others. What if I miss a dose? Keep appointments for follow-up doses. It is important not to miss your dose. Call your care team if you are unable to keep an appointment. What may interact with this medication? Medications for seizures Some antibiotics, such as amikacin, gentamicin, neomycin, streptomycin, tobramycin Vaccines This list may not describe all possible interactions. Give your health care provider a list of all the medicines, herbs,  non-prescription drugs, or dietary supplements you use. Also tell them if you smoke, drink alcohol, or use illegal drugs. Some items may interact with your medicine. What should I watch for while using this medication? Your condition will be monitored carefully while you are receiving this medication. You may need blood work while taking this medication. This medication may make you feel generally unwell. This is not uncommon, as chemotherapy can affect healthy cells as well as cancer cells. Report any side effects. Continue your course of treatment even though you feel ill unless your care team tells you to stop. In some cases, you may be given additional medications to help with side effects. Follow all directions for their use. This medication may increase your risk of getting an infection. Call your care team for advice if you get a fever, chills, sore throat, or other symptoms of a cold or flu. Do not treat yourself. Try to avoid being around people who are sick. Avoid taking medications that contain aspirin, acetaminophen, ibuprofen, naproxen, or ketoprofen unless instructed by your care team. These medications may hide a fever. Be careful  brushing or flossing your teeth or using a toothpick because you may get an infection or bleed more easily. If you have any dental work done, tell your dentist you are receiving this medication. Talk to your care team if you wish to become pregnant or think you might be pregnant. This medication can cause serious birth defects. Talk to your care team about effective forms of contraception. Do not breast-feed while taking this medication. What side effects may I notice from receiving this medication? Side effects that you should report to your care team as soon as possible: Allergic reactions--skin rash, itching, hives, swelling of the face, lips, tongue, or throat Infection--fever, chills, cough, sore throat, wounds that don't heal, pain or trouble when passing  urine, general feeling of discomfort or being unwell Low red blood cell level--unusual weakness or fatigue, dizziness, headache, trouble breathing Pain, tingling, or numbness in the hands or feet, muscle weakness, change in vision, confusion or trouble speaking, loss of balance or coordination, trouble walking, seizures Unusual bruising or bleeding Side effects that usually do not require medical attention (report to your care team if they continue or are bothersome): Hair loss Nausea Unusual weakness or fatigue Vomiting This list may not describe all possible side effects. Call your doctor for medical advice about side effects. You may report side effects to FDA at 1-800-FDA-1088. Where should I keep my medication? This medication is given in a hospital or clinic. It will not be stored at home. NOTE: This sheet is a summary. It may not cover all possible information. If you have questions about this medicine, talk to your doctor, pharmacist, or health care provider.  2024 Elsevier/Gold Standard (2021-11-06 00:00:00)

## 2024-07-19 NOTE — Telephone Encounter (Signed)
 Copied from CRM #[id number]. Topic: Clinical - Prescription Issue >> Jul 19, 2024 12:53 PM Darshell M wrote: Reason for CRM: Per Caretaker April, following up on status pf patient refill is receiving chemo and is severe pain. Advised medication refill is pending. April [phone]

## 2024-07-19 NOTE — Progress Notes (Signed)
 [facility] Clinical Social Work  Clinical Social Work was referred by new patient protocol for assessment of psychosocial needs.  Clinical Social Worker met with patient to offer support and assess for needs during 1st chemo appointment.  Pt reports good support from friends. She also has a paid caregiver for part of the week who helps her around the house. Pt had a stroke in October 2025 and now has cancer diagnosis. She feels she is coping well overall and her dogs (Boston terriers) are a big help. No SDOH needs at this time.     Follow Up Plan:  Patient will contact CSW with any support or resource needs    Lindsee Labarre E Loyde Orth, LCSW  Clinical Social Worker [facility]

## 2024-07-20 ENCOUNTER — Telehealth: Payer: Self-pay

## 2024-07-20 ENCOUNTER — Other Ambulatory Visit: Payer: Self-pay | Admitting: Gynecologic Oncology

## 2024-07-20 ENCOUNTER — Ambulatory Visit: Payer: Self-pay | Admitting: *Deleted

## 2024-07-20 DIAGNOSIS — C579 Malignant neoplasm of female genital organ, unspecified: Secondary | ICD-10-CM

## 2024-07-20 DIAGNOSIS — R978 Other abnormal tumor markers: Secondary | ICD-10-CM

## 2024-07-20 LAB — CANCER ANTIGEN 19-9: CA 19-9: 39 U/mL — ABNORMAL HIGH (ref 0–35)

## 2024-07-20 NOTE — Telephone Encounter (Signed)
 Harlene from [facility] lab, called stating Michele Tyler recently had labs drawn, there was not enough for the CEA. There will need to be a new order (make sure it is the order CEA for the cancer center) and recollect.   I will reach out to the patient for a recollect.

## 2024-07-20 NOTE — Telephone Encounter (Signed)
 Pt coming in on 12/29 @ 1:15 for redraw of CEA only.

## 2024-07-21 ENCOUNTER — Ambulatory Visit: Payer: Self-pay | Admitting: Gynecologic Oncology

## 2024-07-26 ENCOUNTER — Inpatient Hospital Stay

## 2024-07-26 ENCOUNTER — Other Ambulatory Visit: Payer: Self-pay | Admitting: Family Medicine

## 2024-07-26 ENCOUNTER — Other Ambulatory Visit: Payer: Self-pay

## 2024-07-26 ENCOUNTER — Telehealth: Payer: Self-pay

## 2024-07-26 ENCOUNTER — Inpatient Hospital Stay (HOSPITAL_COMMUNITY)
Admission: EM | Admit: 2024-07-26 | Discharge: 2024-08-06 | DRG: 689 | Disposition: A | Discharge status: Skilled Nursing Facility | Attending: Internal Medicine | Admitting: Internal Medicine

## 2024-07-26 ENCOUNTER — Encounter (HOSPITAL_COMMUNITY): Payer: Self-pay

## 2024-07-26 ENCOUNTER — Emergency Department (HOSPITAL_COMMUNITY)

## 2024-07-26 DIAGNOSIS — K7689 Other specified diseases of liver: Secondary | ICD-10-CM | POA: Diagnosis present

## 2024-07-26 DIAGNOSIS — Z9181 History of falling: Secondary | ICD-10-CM

## 2024-07-26 DIAGNOSIS — K521 Toxic gastroenteritis and colitis: Secondary | ICD-10-CM | POA: Diagnosis present

## 2024-07-26 DIAGNOSIS — R270 Ataxia, unspecified: Secondary | ICD-10-CM

## 2024-07-26 DIAGNOSIS — I10 Essential (primary) hypertension: Secondary | ICD-10-CM | POA: Diagnosis present

## 2024-07-26 DIAGNOSIS — R180 Malignant ascites: Secondary | ICD-10-CM | POA: Diagnosis present

## 2024-07-26 DIAGNOSIS — J90 Pleural effusion, not elsewhere classified: Secondary | ICD-10-CM | POA: Diagnosis present

## 2024-07-26 DIAGNOSIS — D701 Agranulocytosis secondary to cancer chemotherapy: Secondary | ICD-10-CM | POA: Diagnosis present

## 2024-07-26 DIAGNOSIS — Z515 Encounter for palliative care: Principal | ICD-10-CM

## 2024-07-26 DIAGNOSIS — Z8543 Personal history of malignant neoplasm of ovary: Secondary | ICD-10-CM

## 2024-07-26 DIAGNOSIS — Z7189 Other specified counseling: Secondary | ICD-10-CM

## 2024-07-26 DIAGNOSIS — E785 Hyperlipidemia, unspecified: Secondary | ICD-10-CM | POA: Diagnosis present

## 2024-07-26 DIAGNOSIS — R64 Cachexia: Secondary | ICD-10-CM | POA: Diagnosis present

## 2024-07-26 DIAGNOSIS — Z79899 Other long term (current) drug therapy: Secondary | ICD-10-CM

## 2024-07-26 DIAGNOSIS — R5381 Other malaise: Secondary | ICD-10-CM | POA: Diagnosis not present

## 2024-07-26 DIAGNOSIS — N3080 Other cystitis without hematuria: Secondary | ICD-10-CM | POA: Diagnosis present

## 2024-07-26 DIAGNOSIS — D61810 Antineoplastic chemotherapy induced pancytopenia: Secondary | ICD-10-CM | POA: Diagnosis present

## 2024-07-26 DIAGNOSIS — R4589 Other symptoms and signs involving emotional state: Secondary | ICD-10-CM | POA: Diagnosis not present

## 2024-07-26 DIAGNOSIS — C786 Secondary malignant neoplasm of retroperitoneum and peritoneum: Secondary | ICD-10-CM | POA: Diagnosis present

## 2024-07-26 DIAGNOSIS — Z7409 Other reduced mobility: Secondary | ICD-10-CM | POA: Diagnosis present

## 2024-07-26 DIAGNOSIS — Z818 Family history of other mental and behavioral disorders: Secondary | ICD-10-CM

## 2024-07-26 DIAGNOSIS — Z8249 Family history of ischemic heart disease and other diseases of the circulatory system: Secondary | ICD-10-CM

## 2024-07-26 DIAGNOSIS — E876 Hypokalemia: Secondary | ICD-10-CM | POA: Diagnosis present

## 2024-07-26 DIAGNOSIS — Z681 Body mass index (BMI) 19 or less, adult: Secondary | ICD-10-CM

## 2024-07-26 DIAGNOSIS — K529 Noninfective gastroenteritis and colitis, unspecified: Secondary | ICD-10-CM | POA: Diagnosis not present

## 2024-07-26 DIAGNOSIS — C569 Malignant neoplasm of unspecified ovary: Secondary | ICD-10-CM | POA: Diagnosis present

## 2024-07-26 DIAGNOSIS — N281 Cyst of kidney, acquired: Secondary | ICD-10-CM | POA: Diagnosis present

## 2024-07-26 DIAGNOSIS — L8990 Pressure ulcer of unspecified site, unspecified stage: Secondary | ICD-10-CM | POA: Insufficient documentation

## 2024-07-26 DIAGNOSIS — F32A Depression, unspecified: Secondary | ICD-10-CM | POA: Diagnosis present

## 2024-07-26 DIAGNOSIS — N1330 Unspecified hydronephrosis: Secondary | ICD-10-CM | POA: Insufficient documentation

## 2024-07-26 DIAGNOSIS — K5909 Other constipation: Secondary | ICD-10-CM | POA: Diagnosis present

## 2024-07-26 DIAGNOSIS — R54 Age-related physical debility: Secondary | ICD-10-CM | POA: Diagnosis present

## 2024-07-26 DIAGNOSIS — Z7902 Long term (current) use of antithrombotics/antiplatelets: Secondary | ICD-10-CM | POA: Diagnosis not present

## 2024-07-26 DIAGNOSIS — J9811 Atelectasis: Secondary | ICD-10-CM | POA: Diagnosis present

## 2024-07-26 DIAGNOSIS — G9341 Metabolic encephalopathy: Secondary | ICD-10-CM | POA: Diagnosis not present

## 2024-07-26 DIAGNOSIS — T451X5A Adverse effect of antineoplastic and immunosuppressive drugs, initial encounter: Secondary | ICD-10-CM | POA: Diagnosis present

## 2024-07-26 DIAGNOSIS — N136 Pyonephrosis: Principal | ICD-10-CM | POA: Diagnosis present

## 2024-07-26 DIAGNOSIS — Z885 Allergy status to narcotic agent status: Secondary | ICD-10-CM

## 2024-07-26 DIAGNOSIS — Z87891 Personal history of nicotine dependence: Secondary | ICD-10-CM

## 2024-07-26 DIAGNOSIS — F419 Anxiety disorder, unspecified: Secondary | ICD-10-CM | POA: Diagnosis present

## 2024-07-26 DIAGNOSIS — N131 Hydronephrosis with ureteral stricture, not elsewhere classified: Secondary | ICD-10-CM | POA: Diagnosis present

## 2024-07-26 DIAGNOSIS — Z8673 Personal history of transient ischemic attack (TIA), and cerebral infarction without residual deficits: Secondary | ICD-10-CM

## 2024-07-26 DIAGNOSIS — I6340 Cerebral infarction due to embolism of unspecified cerebral artery: Secondary | ICD-10-CM

## 2024-07-26 LAB — COMPREHENSIVE METABOLIC PANEL WITH GFR
ALT: 19 U/L (ref 0–44)
AST: 35 U/L (ref 15–41)
Albumin: 3.4 g/dL — ABNORMAL LOW (ref 3.5–5.0)
Alkaline Phosphatase: 85 U/L (ref 38–126)
Anion gap: 8 (ref 5–15)
BUN: 19 mg/dL (ref 8–23)
CO2: 27 mmol/L (ref 22–32)
Calcium: 8.4 mg/dL — ABNORMAL LOW (ref 8.9–10.3)
Chloride: 102 mmol/L (ref 98–111)
Creatinine, Ser: 0.54 mg/dL (ref 0.44–1.00)
GFR, Estimated: >60 mL/min
Glucose, Bld: 86 mg/dL (ref 70–99)
Potassium: 3.7 mmol/L (ref 3.5–5.1)
Sodium: 137 mmol/L (ref 135–145)
Total Bilirubin: 0.3 mg/dL (ref 0.0–1.2)
Total Protein: 5.7 g/dL — ABNORMAL LOW (ref 6.5–8.1)

## 2024-07-26 LAB — URINALYSIS, ROUTINE W REFLEX MICROSCOPIC
Bilirubin Urine: NEGATIVE
Glucose, UA: NEGATIVE mg/dL
Ketones, ur: NEGATIVE mg/dL
Nitrite: NEGATIVE
Protein, ur: 30 mg/dL — AB
RBC / HPF: >50 RBC/hpf (ref 0–5)
Specific Gravity, Urine: 1.016 (ref 1.005–1.030)
pH: 7 (ref 5.0–8.0)

## 2024-07-26 LAB — CBC WITH DIFFERENTIAL/PLATELET
Abs Immature Granulocytes: 0.02 K/uL (ref 0.00–0.07)
Basophils Absolute: 0 K/uL (ref 0.0–0.1)
Basophils Relative: 1 %
Eosinophils Absolute: 0.1 K/uL (ref 0.0–0.5)
Eosinophils Relative: 4 %
HCT: 33 % — ABNORMAL LOW (ref 36.0–46.0)
Hemoglobin: 10.7 g/dL — ABNORMAL LOW (ref 12.0–15.0)
Immature Granulocytes: 1 %
Lymphocytes Relative: 10 %
Lymphs Abs: 0.3 K/uL — ABNORMAL LOW (ref 0.7–4.0)
MCH: 27 pg (ref 26.0–34.0)
MCHC: 32.4 g/dL (ref 30.0–36.0)
MCV: 83.1 fL (ref 80.0–100.0)
Monocytes Absolute: 0.1 K/uL (ref 0.1–1.0)
Monocytes Relative: 3 %
Neutro Abs: 2.7 K/uL (ref 1.7–7.7)
Neutrophils Relative %: 81 %
Platelets: 364 K/uL (ref 150–400)
RBC: 3.97 MIL/uL (ref 3.87–5.11)
RDW: 13.2 % (ref 11.5–15.5)
WBC: 3.4 K/uL — ABNORMAL LOW (ref 4.0–10.5)
nRBC: 0 % (ref 0.0–0.2)

## 2024-07-26 LAB — I-STAT CG4 LACTIC ACID, ED: Lactic Acid, Venous: 0.9 mmol/L (ref 0.5–1.9)

## 2024-07-26 LAB — MAGNESIUM: Magnesium: 1.7 mg/dL (ref 1.7–2.4)

## 2024-07-26 LAB — LIPASE, BLOOD: Lipase: 37 U/L (ref 11–51)

## 2024-07-26 MED ORDER — PIPERACILLIN-TAZOBACTAM 3.375 G IVPB 30 MIN
3.3750 g | Freq: Once | INTRAVENOUS | Status: AC
  Administered 2024-07-26: 3.375 g via INTRAVENOUS
  Filled 2024-07-26: qty 50

## 2024-07-26 MED ORDER — VANCOMYCIN HCL IN DEXTROSE 1-5 GM/200ML-% IV SOLN
1000.0000 mg | Freq: Once | INTRAVENOUS | Status: AC
  Administered 2024-07-27: 1000 mg via INTRAVENOUS
  Filled 2024-07-26: qty 200

## 2024-07-26 MED ORDER — MORPHINE SULFATE (PF) 4 MG/ML IV SOLN
4.0000 mg | Freq: Once | INTRAVENOUS | Status: AC
  Administered 2024-07-26: 4 mg via INTRAVENOUS
  Filled 2024-07-26: qty 1

## 2024-07-26 MED ORDER — LACTATED RINGERS IV SOLN: INTRAVENOUS | Status: DC

## 2024-07-26 MED ORDER — ONDANSETRON HCL 4 MG/2ML IJ SOLN
4.0000 mg | Freq: Once | INTRAMUSCULAR | Status: AC
  Administered 2024-07-26: 4 mg via INTRAVENOUS
  Filled 2024-07-26: qty 2

## 2024-07-26 MED ORDER — HYDROMORPHONE HCL 1 MG/ML IJ SOLN
0.5000 mg | INTRAMUSCULAR | Status: DC | PRN
  Administered 2024-07-30: 0.5 mg via INTRAVENOUS
  Filled 2024-07-26: qty 0.5

## 2024-07-26 MED ORDER — IOHEXOL 300 MG/ML  SOLN
100.0000 mL | Freq: Once | INTRAMUSCULAR | Status: AC | PRN
  Administered 2024-07-26: 100 mL via INTRAVENOUS

## 2024-07-26 MED ORDER — OXYCODONE HCL 5 MG PO TABS
5.0000 mg | ORAL_TABLET | ORAL | Status: AC | PRN
  Administered 2024-07-29: 10 mg via ORAL
  Administered 2024-07-29: 5 mg via ORAL
  Administered 2024-07-31 – 2024-08-05 (×4): 10 mg via ORAL
  Filled 2024-07-26 (×5): qty 2
  Filled 2024-07-26: qty 1

## 2024-07-26 MED ORDER — POLYETHYLENE GLYCOL 3350 17 G PO PACK: 17.0000 g | PACK | Freq: Every day | ORAL | Status: DC | PRN

## 2024-07-26 MED ORDER — SODIUM CHLORIDE 0.9 % IV SOLN: 2.0000 g | Freq: Once | INTRAVENOUS | Status: DC

## 2024-07-26 MED ORDER — ACETAMINOPHEN 500 MG PO TABS
500.0000 mg | ORAL_TABLET | Freq: Four times a day (QID) | ORAL | Status: DC | PRN
  Administered 2024-08-05: 500 mg via ORAL
  Filled 2024-07-26: qty 1

## 2024-07-26 MED ORDER — MELATONIN 5 MG PO TABS
5.0000 mg | ORAL_TABLET | Freq: Every evening | ORAL | Status: DC | PRN
  Administered 2024-07-30 – 2024-08-05 (×4): 5 mg via ORAL
  Filled 2024-07-26 (×4): qty 1

## 2024-07-26 MED ORDER — HEPARIN SODIUM (PORCINE) 5000 UNIT/ML IJ SOLN
5000.0000 [IU] | Freq: Three times a day (TID) | INTRAMUSCULAR | Status: DC
  Administered 2024-07-27 – 2024-08-06 (×32): 5000 [IU] via SUBCUTANEOUS
  Filled 2024-07-26 (×32): qty 1

## 2024-07-26 MED ORDER — PROCHLORPERAZINE EDISYLATE 10 MG/2ML IJ SOLN
5.0000 mg | Freq: Four times a day (QID) | INTRAMUSCULAR | Status: DC | PRN
  Administered 2024-08-02 – 2024-08-05 (×3): 5 mg via INTRAVENOUS
  Filled 2024-07-26 (×5): qty 2

## 2024-07-26 NOTE — ED Provider Notes (Signed)
 " [place] EMERGENCY DEPARTMENT AT [facility] Provider Note   CSN: [id number] Arrival date & time: 07/26/24  1759     Patient presents with: Weakness, Diarrhea, and Abdominal Pain   Michele Tyler is a 78 y.o. female.   Patient with history of hypertension, stroke, newly diagnosed metastatic gynecologic malignancy s/p 1 round of chemotherapy on 12/22 presents today with complaints of weakness, diarrhea, abdominal pain. Reports that she has been feeling unwell since she had her chemo infusion on 12/22. She has a port placed in her right chest that was placed recently. She called her oncologist but was told that they were out of town. She reports that she has been having worsening abdominal pain with around 3-4 episodes of diarrhea daily. Denies hematochezia or melena, no nausea or vomiting. No fevers or chills, no urinary symptoms.    The history is provided by the patient. No language interpreter was used.  Weakness Associated symptoms: abdominal pain and diarrhea   Diarrhea Associated symptoms: abdominal pain   Abdominal Pain Associated symptoms: diarrhea        Prior to Admission medications  Medication Sig Start Date End Date Taking? Authorizing Provider  artificial tears ophthalmic solution Place 1 drop into both eyes as needed for dry eyes. 06/24/24   Cindy Garnette POUR, MD  Ascorbic Acid (VITAMIN C CR) 1500 MG TBCR Take 1,500 mg by mouth daily.    [provider]  atorvastatin  (LIPITOR) 10 MG tablet TAKE 1 TABLET BY MOUTH AT  BEDTIME 05/19/24   [name], MD  B Complex-Folic Acid  (BALANCED B-150 PO) Take 1 capsule by mouth daily.    [provider]  busPIRone  (BUSPAR ) 7.5 MG tablet TAKE 1 TABLET BY MOUTH 3 TIMES  DAILY Patient taking differently: Take 7.5 mg by mouth at bedtime. 12/25/23   [name], MD  CALCIUM  MAGNESIUM ZINC PO Take 1 tablet by mouth in the morning.    [provider]  Cholecalciferol  (VITAMIN D3) 5000 units  CAPS Take 5,000 Units by mouth daily with breakfast.    [provider]  clopidogrel  (PLAVIX ) 75 MG tablet Take 1 tablet (75 mg total) by mouth daily. 06/25/24   Cindy Garnette POUR, MD  Cyanocobalamin  (B-12) 1000 MCG CAPS Take 1,000 mcg by mouth in the morning.    [provider]  dexamethasone  (DECADRON ) 4 MG tablet Take 2 tabs at the night before and 2 tab the morning of chemotherapy, every 3 weeks, by mouth x 6 cycles 07/09/24   Lonn Hicks, MD  folic acid  (FOLVITE ) 800 MCG tablet Take 800 mcg by mouth daily.    [provider]  lactulose  (CHRONULAC ) 10 GM/15ML solution Take 15 mLs (10 g total) by mouth 2 (two) times daily as needed for mild constipation. Titrate up as needed for goal of 1 BM per day 07/06/24   [name], MD  lidocaine -prilocaine  (EMLA ) cream Apply to affected area once 07/09/24   Lonn Hicks, MD  mirtazapine  (REMERON ) 7.5 MG tablet Take 1 tablet (7.5 mg total) by mouth at bedtime. 06/30/24   [name], MD  Omega-3 Fatty Acids (OMEGA-3 FISH OIL PO) Take 4,000 mg by mouth daily.    [provider]  ondansetron  (ZOFRAN ) 4 MG tablet Take 1 tablet (4 mg total) by mouth every 8 (eight) hours as needed for nausea or vomiting. 06/30/24   [name], MD  ondansetron  (ZOFRAN ) 8 MG tablet Take 1 tablet (8 mg total) by mouth every  8 (eight) hours as needed for nausea or vomiting. Start on the third day after carboplatin . 07/09/24   Lonn Hicks, MD  oxyCODONE  (OXY IR/ROXICODONE ) 5 MG immediate release tablet Take 1 tablet (5 mg total) by mouth every 4 (four) hours as needed for severe pain (pain score 7-10) or breakthrough pain. 07/26/24   [name], MD  polyethylene glycol powder (GLYCOLAX /MIRALAX ) 17 GM/SCOOP powder Take 17 g by mouth daily as needed for mild constipation. Dissolve 1 capful (17g) in 4-8 ounces of liquid and take by mouth daily.    [provider]  prochlorperazine  (COMPAZINE ) 10 MG tablet Take 1 tablet (10 mg total)  by mouth every 6 (six) hours as needed for nausea or vomiting. 07/09/24   Lonn Hicks, MD  promethazine  (PHENERGAN ) 25 MG tablet Take 1 tablet (25 mg total) by mouth every 6 (six) hours as needed for nausea or vomiting. 07/06/24   [name], MD  spironolactone  (ALDACTONE ) 100 MG tablet Take 1 tablet (100 mg total) by mouth daily. 06/30/24   [name], MD  vitamin A 8000 UNIT capsule Take 8,000 Units by mouth daily.    [provider]  VITAMIN E-1000 PO Take 1,000 Units by mouth daily.    [provider]  Vitamins-Lipotropics (BALANCED B-150 COMPLEX TR PO) Take 1 tablet by mouth See admin instructions. Take 1 tablet by mouth once a day (contains b-1 @ 150 mg, b-2 150 mg, b-6 150 mg, b-12 @ 150 mg, and niacin 150 mg)    [provider]    Allergies: Codeine    Review of Systems  Gastrointestinal:  Positive for abdominal pain and diarrhea.  Neurological:  Positive for weakness.  All other systems reviewed and are negative.   Updated Vital Signs BP (!) 156/101 (BP Location: Right Arm)   Pulse 95   Temp 97.9 F (36.6 C) (Oral)   Resp (!) 22   SpO2 91%   Physical Exam Vitals and nursing note reviewed.  Constitutional:      General: She is not in acute distress.    Appearance: Normal appearance. She is normal weight. She is not ill-appearing, toxic-appearing or diaphoretic.  HENT:     Head: Normocephalic and atraumatic.  Cardiovascular:     Rate and Rhythm: Normal rate and regular rhythm.     Heart sounds: Normal heart sounds.  Pulmonary:     Effort: Pulmonary effort is normal. No respiratory distress.  Chest:     Comments: Port present right anterior chest with some bruising present. Abdominal:     Tenderness: There is abdominal tenderness in the suprapubic area. There is no guarding or rebound.  Musculoskeletal:        General: Normal range of motion.     Cervical back: Normal range of motion.  Skin:    General: Skin is warm and dry.   Neurological:     General: No focal deficit present.     Mental Status: She is alert.  Psychiatric:        Mood and Affect: Mood normal.        Behavior: Behavior normal.     (all labs ordered are listed, but only abnormal results are displayed) Labs Reviewed  COMPREHENSIVE METABOLIC PANEL WITH GFR - Abnormal; Notable for the following components:      Result Value   Calcium  8.4 (*)    Total Protein 5.7 (*)    Albumin  3.4 (*)    All other components within normal limits  URINALYSIS, ROUTINE W REFLEX MICROSCOPIC - Abnormal; Notable for the following components:   Color, Urine AMBER (*)    APPearance CLOUDY (*)    Hgb urine dipstick MODERATE (*)    Protein, ur 30 (*)    Leukocytes,Ua TRACE (*)    Bacteria, UA MANY (*)    All other components within normal limits  CBC WITH DIFFERENTIAL/PLATELET - Abnormal; Notable for the following components:   WBC 3.4 (*)    Hemoglobin 10.7 (*)    HCT 33.0 (*)    Lymphs Abs 0.3 (*)    All other components within normal limits  URINE CULTURE  LIPASE, BLOOD  MAGNESIUM    EKG: None  Radiology: No results found.   .Critical Care  Performed by: Nora Lauraine LABOR, PA-C Authorized by: Sherriann Szuch A, PA-C   Critical care provider statement:    Critical care time (minutes):  35   Critical care was necessary to treat or prevent imminent or life-threatening deterioration of the following conditions: Severe, diffuse emphysematous cystitis.   Critical care was time spent personally by me on the following activities:  Development of treatment plan with patient or surrogate, discussions with consultants, discussions with primary provider, evaluation of patient's response to treatment, examination of patient, obtaining history from patient or surrogate, ordering and review of laboratory studies, ordering and review of radiographic studies, pulse oximetry, re-evaluation of patient's condition and review of old charts   Care discussed with: admitting  provider      Medications Ordered in the ED  morphine  (PF) 4 MG/ML injection 4 mg (4 mg Intravenous Given 07/26/24 1936)  ondansetron  (ZOFRAN ) injection 4 mg (4 mg Intravenous Given 07/26/24 1936)  iohexol  (OMNIPAQUE ) 300 MG/ML solution 100 mL (100 mLs Intravenous Contrast Given 07/26/24 2034)                                    Medical Decision Making Amount and/or Complexity of Data Reviewed Labs: ordered. Radiology: ordered.  Risk Prescription drug management. Decision regarding hospitalization.   This patient is a 78 y.o. female who presents to the ED for concern of abdominal pain, weakness, diarrhea, this involves an extensive number of treatment options, and is a complaint that carries with it a high risk of complications and morbidity. The emergent differential diagnosis prior to evaluation includes, but is not limited to,  adverse reaction to chemotherapy, Cdiff, colitis, diverticulitis, sepsis, electrolyte derangement, AAA, gastroenteritis, appendicitis, Bowel obstruction, Bowel perforation. Gastroparesis, DKA, Hernia, Inflammatory bowel disease, mesenteric ischemia, pancreatitis, peritonitis SBP, volvulus.  This is not an exhaustive differential.   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 Pertinent results include: has seen Dr. Lonn with oncology and Dr. Viktoria with gyn/onc, had port placed on 12/16, first round of chemo on 12/22  Patient is full code  Physical Exam: Physical exam performed. The pertinent findings include: chronically unwell appearing, generalized lower abdominal TTP without rebound or guarding  Lab Tests: I ordered, and personally interpreted labs.  The pertinent results include:  WBC 3.4, neutrophils WNL, UA with protein, trace leukocytes, >50 RBCs, 21-50 WBCs, many bacteria  concerning for infection, culture collected and is pending   Imaging Studies: I ordered imaging studies including CT abdomen pelvis. I independently visualized and interpreted imaging which showed   Severe, diffuse emphysematous  cystitis with extensive intramural gas and moderate intraluminal gas, consistent with severe infection and high risk for sepsis, typically seen in immunosuppressed or diabetic patients.   Progressive omental caking with peritoneal thickening and nodularity, consistent with peritoneal carcinomatosis. Superimposed circumferential bowel wall thickening and edema involving the sigmoid colon, suggesting infectious or inflammatory colitis; no bowel obstruction. Large volume ascites, improved since prior examination. Moderate bilateral pleural effusions with compressive atelectasis at the lung bases. Left hydronephrosis with distention of the left renal pelvis and decompressed ureter consistent with chronic UPJ obstruction; left renal cortical thickness preserved. Multiple simple hepatic cysts and bilateral simple cortical renal cysts, unchanged; no follow-up recommended for simple cysts.  I agree with the radiologist interpretation.   Cardiac Monitoring:  The patient was maintained on a cardiac monitor.  My attending physician viewed and interpreted the cardiac monitored which showed an underlying rhythm of: sinus rhythm, no STEMI. I agree with this interpretation.   Medications: I ordered medication including morphine , zofran , Vancomycin , Zosyn ,   for pain, nausea,  emphysematous cystitis. Reevaluation of the patient after these medicines showed that the patient improved. I have reviewed the patients home medicines and have made adjustments as needed.  Consultations Obtained: I requested consultation with the urology on call Maurilio Agar,  and discussed lab and imaging findings as well as pertinent plan - they recommend: foley catheter placement for decompression, IV  antibiotics, hospitalist admission   Disposition: After consideration of the diagnostic results and the patients response to treatment, I feel that patient will require admission for emphysematous cystitis seen on CT imaging. Discussed same with patient who is understanding and in agreement with this plan.    Discussed patient with ICU on call Dr. Layman at the request of urology given high risk for decompensation, they feel like given she is currently stable that no indication for ICU admission at this time  Discussed patient with hospitalist Dr. Shona who will admit the patient, but requests critical care come see the patient a put in a consult note.   Reached back out to Dr. Layman, they will see the patient in consultation.  This is a shared visit with supervising physician Dr. 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

## 2024-07-26 NOTE — Telephone Encounter (Signed)
 Reason for Call:   Friend contacted the office requesting advice on how to support the patients nutrition. She reports the patient has been taking Boost and soup, but continues to have poor intake.  Additional Information:  Pain medication is being managed and ordered by the PCP team.  Lab appointment was canceled and rescheduled for January 5 at 3:30 PM due to the patient being fairly fatigued.  Action Taken:   Provided general guidance on encouraging improved nutrition and forwarded concerns to the care team for further review as needed.

## 2024-07-26 NOTE — Telephone Encounter (Signed)
 I have submitted a misc. DME order.

## 2024-07-26 NOTE — Progress Notes (Signed)
 ED Pharmacy Antibiotic Sign Off An antibiotic consult was received from an ED provider for Vancomycin  and Zosyn  per pharmacy dosing for intra-abdominal infection/bacteremia. A chart review was completed to assess appropriateness.   The following one time order(s) were placed:  Vancomycin  1g IV Zosyn  3.375g IV  Further antibiotic and/or antibiotic pharmacy consults should be ordered by the admitting provider if indicated.   Thank you for allowing pharmacy to be a part of this patient's care.   Arvin Gauss, PharmD Clinical Pharmacist 07/26/2024 10:34 PM

## 2024-07-26 NOTE — ED Triage Notes (Addendum)
 Pt BIB EMS from home for diarrhea and weakness that started two days ago. Pt was able to ambulate before but now is bed bound. Pt's family also wants to talk about palliative care for her. Pt has not been able to eat for the last four days. Pt's last infusion for her cancer was on 07/19/24. Lastly, pt c/o right sided abdominal pain.  Hx Ovarian Cancer and Strokes

## 2024-07-26 NOTE — Telephone Encounter (Signed)
 LVM for them to call back about the below message, need to ask her about where insurance said to send the order.

## 2024-07-26 NOTE — H&P (Signed)
 " History and Physical  Michele Tyler FMW:[id number] DOB: 1946/04/06 DOA: 07/26/2024  Referring physician: Candi Domino, PA-EDP  PCP: [name], MD  Outpatient Specialists: Gynecology oncology Patient coming from: Home.  Chief Complaint: Generalized weakness, diarrhea, abdominal pain.  HPI: Michele Tyler is a 78 y.o. female with medical history significant for newly diagnosed metastatic gynecological malignancy status post 1 round of chemotherapy on 07/19/2024, hyperlipidemia, hypertension, chronic anxiety/depression, who presents to the ER from home via EMS with complaint of diarrhea, right-sided abdominal pain and generalized weakness for the past 2 days.  Symptoms have affected her mobility.  Endorses not been able to eat for the past 4 days.  Latest chemotherapy infusion was on 07/19/2024.  In the ER, tachypneic.  CT abdomen pelvis with contrast revealed severe, diffuse emphysematous cystitis with extensive intramural gas and moderate intraluminal gas, consistent with severe infection and high risk for sepsis, typically seen in immunosuppressed or diabetic patients.  Progressive omental caking with peritoneal thickening and nodularity, consistent with peritoneal carcinomatosis.  Superimposed circumferential bowel wall thickening and edema involving the sigmoid colon, suggesting infectious or inflammatory colitis, no bowel obstruction.  Moderate bilateral pleural effusions with compressive atelectasis at the lung bases.  Left hydronephrosis with distention of the left renal pelvis and decompressed ureter consistent with chronic UPJ obstruction, left renal cortical thickness preserved.  Multiple simple hepatic cysts and bilateral simple cortical renal cysts.  EDP discussed the case with urology.  Recommended Foley catheter placement.  Urology will see in consultation.  EDP also discussed the case with critical care medicine.  Recommended to continue IV Zosyn  initiated in the ER, and to  reconsult if needed.  Admitted by [facility], hospitalist service.  ED Course: Temperature 97.9.  BP 150/93, pulse 85, respiration rate 23, O2 saturation 100% on room air.  Review of Systems: Review of systems as noted in the HPI. All other systems reviewed and are negative.   Past Medical History:  Diagnosis Date   Anxiety    Depression    Hypertension    Stroke (cerebrum) [facility])    Past Surgical History:  Procedure Laterality Date   DILATION AND CURETTAGE OF UTERUS  2002   FACELIFT  2005   IR IMAGING GUIDED PORT INSERTION  07/13/2024   IR PARACENTESIS  06/23/2024   TRANSESOPHAGEAL ECHOCARDIOGRAM (CATH LAB) N/A 06/16/2024   Procedure: TRANSESOPHAGEAL ECHOCARDIOGRAM;  Surgeon: Delford Maude BROCKS, MD;  Location: [facility] INVASIVE [facility] LAB;  Service: Cardiovascular;  Laterality: N/A;    Social History:  reports that she quit smoking about 38 years ago. Her smoking use included cigarettes. She started smoking about 48 years ago. She has a 10 pack-year smoking history. She has never used smokeless tobacco. She reports current alcohol  use of about 1.0 - 2.0 standard drink of alcohol  per week. She reports that she does not use drugs.   Allergies[1]  Family History  Problem Relation Age of Onset   Hypertension Mother    Depression Father    Hypertension Father       Prior to Admission medications  Medication Sig Start Date End Date Taking? Authorizing Provider  artificial tears ophthalmic solution Place 1 drop into both eyes as needed for dry eyes. 06/24/24   Cindy Garnette POUR, MD  Ascorbic Acid (VITAMIN C CR) 1500 MG TBCR Take 1,500 mg by mouth daily.    [provider]  atorvastatin  (LIPITOR) 10 MG tablet TAKE 1 TABLET BY MOUTH AT  BEDTIME 05/19/24   [name], MD  B  Complex-Folic Acid  (BALANCED B-150 PO) Take 1 capsule by mouth daily.    [provider]  busPIRone  (BUSPAR ) 7.5 MG tablet TAKE 1 TABLET BY MOUTH 3 TIMES  DAILY Patient taking differently: Take 7.5 mg by  mouth at bedtime. 12/25/23   [name], MD  CALCIUM  MAGNESIUM ZINC PO Take 1 tablet by mouth in the morning.    [provider]  Cholecalciferol  (VITAMIN D3) 5000 units CAPS Take 5,000 Units by mouth daily with breakfast.    [provider]  clopidogrel  (PLAVIX ) 75 MG tablet Take 1 tablet (75 mg total) by mouth daily. 06/25/24   Cindy Garnette POUR, MD  Cyanocobalamin  (B-12) 1000 MCG CAPS Take 1,000 mcg by mouth in the morning.    [provider]  dexamethasone  (DECADRON ) 4 MG tablet Take 2 tabs at the night before and 2 tab the morning of chemotherapy, every 3 weeks, by mouth x 6 cycles 07/09/24   Lonn Hicks, MD  folic acid  (FOLVITE ) 800 MCG tablet Take 800 mcg by mouth daily.    [provider]  lactulose  (CHRONULAC ) 10 GM/15ML solution Take 15 mLs (10 g total) by mouth 2 (two) times daily as needed for mild constipation. Titrate up as needed for goal of 1 BM per day 07/06/24   [name], MD  lidocaine -prilocaine  (EMLA ) cream Apply to affected area once 07/09/24   Lonn Hicks, MD  mirtazapine  (REMERON ) 7.5 MG tablet Take 1 tablet (7.5 mg total) by mouth at bedtime. 06/30/24   [name], MD  Omega-3 Fatty Acids (OMEGA-3 FISH OIL PO) Take 4,000 mg by mouth daily.    [provider]  ondansetron  (ZOFRAN ) 4 MG tablet Take 1 tablet (4 mg total) by mouth every 8 (eight) hours as needed for nausea or vomiting. 06/30/24   [name], MD  ondansetron  (ZOFRAN ) 8 MG tablet Take 1 tablet (8 mg total) by mouth every 8 (eight) hours as needed for nausea or vomiting. Start on the third day after carboplatin . 07/09/24   Lonn Hicks, MD  oxyCODONE  (OXY IR/ROXICODONE ) 5 MG immediate release tablet Take 1 tablet (5 mg total) by mouth every 4 (four) hours as needed for severe pain (pain score 7-10) or breakthrough pain. 07/26/24   [name], MD  polyethylene glycol powder (GLYCOLAX /MIRALAX ) 17 GM/SCOOP powder Take 17 g by mouth daily as needed for mild  constipation. Dissolve 1 capful (17g) in 4-8 ounces of liquid and take by mouth daily.    [provider]  prochlorperazine  (COMPAZINE ) 10 MG tablet Take 1 tablet (10 mg total) by mouth every 6 (six) hours as needed for nausea or vomiting. 07/09/24   Lonn Hicks, MD  promethazine  (PHENERGAN ) 25 MG tablet Take 1 tablet (25 mg total) by mouth every 6 (six) hours as needed for nausea or vomiting. 07/06/24   [name], MD  spironolactone  (ALDACTONE ) 100 MG tablet Take 1 tablet (100 mg total) by mouth daily. 06/30/24   [name], MD  vitamin A 8000 UNIT capsule Take 8,000 Units by mouth daily.    [provider]  VITAMIN E-1000 PO Take 1,000 Units by mouth daily.    [provider]  Vitamins-Lipotropics (BALANCED B-150 COMPLEX TR PO) Take 1 tablet by mouth See admin instructions. Take 1 tablet by mouth once a day (contains b-1 @ 150 mg, b-2 150 mg, b-6 150 mg, b-12 @ 150 mg, and niacin 150 mg)    [provider]    Physical  Exam: BP (!) 154/89 (BP Location: Right Arm)   Pulse 95   Temp 97.9 F (36.6 C) (Oral)   Resp 16   SpO2 97%   General: 78 y.o. year-old female well developed well nourished in no acute distress.  Alert and oriented x3. Cardiovascular: Regular rate and rhythm with no rubs or gallops.  No thyromegaly or JVD noted.  No lower extremity edema. 2/4 pulses in all 4 extremities. Respiratory: Clear to auscultation with no wheezes or rales. Good inspiratory effort. Abdomen: Mildly distended with normal bowel sounds x4 quadrants. Muskuloskeletal: No cyanosis, clubbing or edema noted bilaterally Neuro: CN II-XII intact, strength, sensation, reflexes Skin: No ulcerative lesions noted or rashes Psychiatry: Judgement and insight appear normal. Mood is appropriate for condition and setting          Labs on Admission:  Basic Metabolic Panel: Recent Labs  Lab 07/26/24 1933  NA 137  K 3.7  CL 102  CO2 27  GLUCOSE 86  BUN 19  CREATININE  0.54  CALCIUM  8.4*  MG 1.7   Liver Function Tests: Recent Labs  Lab 07/26/24 1933  AST 35  ALT 19  ALKPHOS 85  BILITOT 0.3  PROT 5.7*  ALBUMIN  3.4*   Recent Labs  Lab 07/26/24 1933  LIPASE 37   No results for input(s): AMMONIA in the last 168 hours. CBC: Recent Labs  Lab 07/26/24 1933  WBC 3.4*  NEUTROABS 2.7  HGB 10.7*  HCT 33.0*  MCV 83.1  PLT 364   Cardiac Enzymes: No results for input(s): CKTOTAL, CKMB, CKMBINDEX, TROPONINI in the last 168 hours.  BNP (last 3 results) No results for input(s): BNP in the last 8760 hours.  ProBNP (last 3 results) No results for input(s): PROBNP in the last 8760 hours.  CBG: No results for input(s): GLUCAP in the last 168 hours.  Radiological Exams on Admission:   EKG: I independently viewed the EKG done and my findings are as followed: Sinus rhythm rate of 94.  QTc 423.  Assessment/Plan Present on Admission:  Emphysematous cystitis  Principal Problem:   Emphysematous cystitis  Severe emphysematous cystitis, POA Continue IV Zosyn  Continue IV fluid maintenance Urology and PCCM have been consulted to assist with the management. Monitor urine culture and peripheral blood cultures x 2 Monitor fever curve and WBCs.  Newly diagnosed metastatic gynecological malignancy status post 1 round of chemotherapy on 07/19/2024 Recommend close follow-up with medical oncology. Follows with Dr. Viktoria, Gyn-oncology, outpatient.  Hypertension BP is not at goal, elevated Norvasc  5 mg daily Closely monitor vital signs  Mild leukopenia WBC 3.4 Closely monitor  Chronic normocytic anemia Hemoglobin 10.7 with no overt bleeding reported. Continue to monitor H&H  Generalized weakness PT OT evaluation Fall precautions.   Critical care time: 55 minutes.   DVT prophylaxis: Subcu heparin  3 times daily.  Code Status: Full code, per the patient.  Family Communication: None at bedside.  Disposition Plan:  Admitted to stepdown unit.  Consults called: Urology, PCCM.  Admission status: Inpatient status.   Status is: Inpatient Patient requires at least 2 midnights for further evaluation and treatment of present condition.   Terry LOISE Hurst MD [facility] Pager [phone]  If 7PM-7AM, please contact night-coverage [email or web address] Password [email or web address]  07/26/2024, 11:10 PM      [1]  Allergies Allergen Reactions   Codeine Nausea Only   "

## 2024-07-26 NOTE — ED Notes (Signed)
 Called lab to request add-on urine culture as ordered.

## 2024-07-26 NOTE — Consult Note (Signed)
 "  NAME:  Michele Tyler, MRN:  [id number], DOB:  10-Aug-1945, LOS: 0 ADMISSION DATE:  07/26/2024, CONSULTATION DATE:  07/26/2024  REFERRING MD:  GERALDYNE Hurst , CHIEF COMPLAINT: Abdominal pain, weakness  History of Present Illness:  Asked by [facility] /ED  physician to consult on this 78 year old with advanced ovarian cancer with malignant ascites due to radiologic finding of emphysematous cystitis.  She was brought in by EMS due to generalized weakness, diarrhea and right-sided abdominal pain ED vitals significant for hypertension, afebrile, heart rate 90s Labs showed WBC 3.4, slight drop in hemoglobin to 10.7, lactate 0.9 CT abdomen/pelvis showed severe diffuse emphysematous cystitis with findings of peritoneal carcinomatosis and thickening and edema of sigmoid colon large volume ascites moderate bilateral pleural effusions, left hydronephrosis consistent with chronic UPJ obstruction UA was cloudy with many WBCs and bacteria  Pertinent  Medical History  Ovarian cancer, recently diagnosed, malignant ascites, status post Port-A-Cath,?  Chemotherapy 07/2018 Last paracentesis 12/15  Significant Hospital Events: Including procedures, antibiotic start and stop dates in addition to other pertinent events     Interim History / Subjective:  Complains of right lower quadrant abdominal pain No fevers, dyspnea  Objective    Blood pressure (!) 154/89, pulse 95, temperature 97.9 F (36.6 C), temperature source Oral, resp. rate 16, SpO2 97%.       No intake or output data in the 24 hours ending 07/26/24 2343 There were no vitals filed for this visit.  Examination: Gen. Pleasant, well-nourished, in no distress, in ED stretcher ENT - no thrush, no pallor/icterus,no post nasal drip Neck: No JVD, no thyromegaly, no carotid bruits Lungs: no use of accessory muscles, no dullness to percussion, clear without rales or rhonchi  Cardiovascular: Rhythm regular, heart sounds  normal, no murmurs or gallops, no  peripheral edema Abdomen -right lower quadrant tenderness, no guarding Musculoskeletal: No deformities, no cyanosis or clubbing    Resolved problem list   Assessment and Plan   Severe emphysematous cystitis -no evidence of sepsis currently or organ failure , but at a high risk for same. 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, on neoadjuvant chemotherapy with plans for debulking after 3 cycles  -Agree with Zosyn , no history of prior resistant organism - Can admit to stepdown unit for monitoring -Urology consultation for chronic UPJ obstruction likely related to malignancy vs need for percutaneous drainage  PCCM will be available as needed, please re-consult if she worsens  Labs   CBC: Recent Labs  Lab 07/26/24 1933  WBC 3.4*  NEUTROABS 2.7  HGB 10.7*  HCT 33.0*  MCV 83.1  PLT 364    Basic Metabolic Panel: Recent Labs  Lab 07/26/24 1933  NA 137  K 3.7  CL 102  CO2 27  GLUCOSE 86  BUN 19  CREATININE 0.54  CALCIUM  8.4*  MG 1.7   GFR: Estimated Creatinine Clearance: 46.3 mL/min (by C-G formula based on SCr of 0.54 mg/dL). Recent Labs  Lab 07/26/24 1933 07/26/24 2240  WBC 3.4*  --   LATICACIDVEN  --  0.9    Liver Function Tests: Recent Labs  Lab 07/26/24 1933  AST 35  ALT 19  ALKPHOS 85  BILITOT 0.3  PROT 5.7*  ALBUMIN  3.4*   Recent Labs  Lab 07/26/24 1933  LIPASE 37   No results for input(s): AMMONIA in the last 168 hours.  ABG    Component Value Date/Time  TCO2 27 06/16/2024 1103     Coagulation Profile: No results for input(s): INR, PROTIME in the last 168 hours.  Cardiac Enzymes: No results for input(s): CKTOTAL, CKMB, CKMBINDEX, TROPONINI in the last 168 hours.  HbA1C: Hgb A1c MFr Bld  Date/Time Value Ref Range Status  05/12/2024 02:20 PM 5.5 4.8 - 5.6 % Final    Comment:    (NOTE) Diagnosis of  Diabetes The following HbA1c ranges recommended by the American Diabetes Association (ADA) may be used as an aid in the diagnosis of diabetes mellitus.  Hemoglobin             Suggested A1C NGSP%              Diagnosis  <5.7                   Non Diabetic  5.7-6.4                Pre-Diabetic  >6.4                   Diabetic  <7.0                   Glycemic control for                       adults with diabetes.    12/24/2023 09:49 AM 5.5 4.8 - 5.6 % Final    Comment:             Prediabetes: 5.7 - 6.4          Diabetes: >6.4          Glycemic control for adults with diabetes: <7.0     CBG: No results for input(s): GLUCAP in the last 168 hours.  Review of Systems:   As per HPI  Past Medical History:  She,  has a past medical history of Anxiety, Depression, Hypertension, and Stroke (cerebrum) (HCC).   Surgical History:   Past Surgical History:  Procedure Laterality Date   DILATION AND CURETTAGE OF UTERUS  2002   FACELIFT  2005   IR IMAGING GUIDED PORT INSERTION  07/13/2024   IR PARACENTESIS  06/23/2024   TRANSESOPHAGEAL ECHOCARDIOGRAM (CATH LAB) N/A 06/16/2024   Procedure: TRANSESOPHAGEAL ECHOCARDIOGRAM;  Surgeon: Delford Maude BROCKS, MD;  Location: [facility] INVASIVE CV LAB;  Service: Cardiovascular;  Laterality: N/A;     Social History:   reports that she quit smoking about 38 years ago. Her smoking use included cigarettes. She started smoking about 48 years ago. She has a 10 pack-year smoking history. She has never used smokeless tobacco. She reports current alcohol  use of about 1.0 - 2.0 standard drink of alcohol  per week. She reports that she does not use drugs.   Family History:  Her family history includes Depression in her father; Hypertension in her father and mother.   Allergies Allergies[1]   Home Medications  Prior to Admission medications  Medication Sig Start Date End Date Taking? Authorizing Provider  artificial tears ophthalmic solution Place 1 drop  into both eyes as needed for dry eyes. 06/24/24  Yes Cindy Garnette POUR, MD  atorvastatin  (LIPITOR) 10 MG tablet TAKE 1 TABLET BY MOUTH AT  BEDTIME 05/19/24  Yes [name], MD  busPIRone  (BUSPAR ) 7.5 MG tablet TAKE 1 TABLET BY MOUTH 3 TIMES  DAILY Patient taking differently: Take 7.5 mg by mouth at bedtime. 12/25/23  Yes [name], MD  clopidogrel  (PLAVIX ) 75 MG  tablet Take 1 tablet (75 mg total) by mouth daily. 06/25/24  Yes Cindy Garnette POUR, MD  dexamethasone  (DECADRON ) 4 MG tablet Take 2 tabs at the night before and 2 tab the morning of chemotherapy, every 3 weeks, by mouth x 6 cycles 07/09/24  Yes Lonn Hicks, MD  lidocaine -prilocaine  (EMLA ) cream Apply to affected area once 07/09/24  Yes Gorsuch, Ni, MD  ondansetron  (ZOFRAN ) 8 MG tablet Take 1 tablet (8 mg total) by mouth every 8 (eight) hours as needed for nausea or vomiting. Start on the third day after carboplatin . 07/09/24  Yes Gorsuch, Ni, MD  oxyCODONE  (OXY IR/ROXICODONE ) 5 MG immediate release tablet Take 1 tablet (5 mg total) by mouth every 4 (four) hours as needed for severe pain (pain score 7-10) or breakthrough pain. 07/26/24  Yes [name], MD  prochlorperazine  (COMPAZINE ) 10 MG tablet Take 1 tablet (10 mg total) by mouth every 6 (six) hours as needed for nausea or vomiting. 07/09/24  Yes Lonn, Ni, MD  promethazine  (PHENERGAN ) 25 MG tablet Take 1 tablet (25 mg total) by mouth every 6 (six) hours as needed for nausea or vomiting. 07/06/24  Yes [name], MD  Ascorbic Acid (VITAMIN C CR) 1500 MG TBCR Take 1,500 mg by mouth daily. Patient not taking: Reported on 07/26/2024    [provider]  B Complex-Folic Acid  (BALANCED B-150 PO) Take 1 capsule by mouth daily. Patient not taking: Reported on 07/26/2024    [provider]  CALCIUM  MAGNESIUM ZINC PO Take 1 tablet by mouth in the morning. Patient not taking: Reported on 07/26/2024    [provider]  Cholecalciferol  (VITAMIN D3) 5000  units CAPS Take 5,000 Units by mouth daily with breakfast. Patient not taking: Reported on 07/26/2024    [provider]  Cyanocobalamin  (B-12) 1000 MCG CAPS Take 1,000 mcg by mouth in the morning. Patient not taking: Reported on 07/26/2024    [provider]  folic acid  (FOLVITE ) 800 MCG tablet Take 800 mcg by mouth daily. Patient not taking: Reported on 07/26/2024    [provider]  lactulose  (CHRONULAC ) 10 GM/15ML solution Take 15 mLs (10 g total) by mouth 2 (two) times daily as needed for mild constipation. Titrate up as needed for goal of 1 BM per day Patient not taking: Reported on 07/26/2024 07/06/24   [name], MD  mirtazapine  (REMERON ) 7.5 MG tablet Take 1 tablet (7.5 mg total) by mouth at bedtime. Patient not taking: Reported on 07/26/2024 06/30/24   [name], MD  Omega-3 Fatty Acids (OMEGA-3 FISH OIL PO) Take 4,000 mg by mouth daily. Patient not taking: Reported on 07/26/2024    [provider]  ondansetron  (ZOFRAN ) 4 MG tablet Take 1 tablet (4 mg total) by mouth every 8 (eight) hours as needed for nausea or vomiting. Patient not taking: Reported on 07/26/2024 06/30/24   [name], MD  polyethylene glycol powder (GLYCOLAX /MIRALAX ) 17 GM/SCOOP powder Take 17 g by mouth daily as needed for mild constipation. Dissolve 1 capful (17g) in 4-8 ounces of liquid and take by mouth daily. Patient not taking: Reported on 07/26/2024    [provider]  spironolactone  (ALDACTONE ) 100 MG tablet Take 1 tablet (100 mg total) by mouth daily. Patient not taking: Reported on 07/26/2024 06/30/24   [name], MD  vitamin A 8000 UNIT capsule Take 8,000 Units by mouth daily. Patient not taking: Reported on 07/26/2024    [provider]  VITAMIN E-1000 PO Take  1,000 Units by mouth daily. Patient not taking: Reported on 07/26/2024    [provider]  Vitamins-Lipotropics (BALANCED B-150 COMPLEX TR PO) Take 1 tablet by mouth  See admin instructions. Take 1 tablet by mouth once a day (contains b-1 @ 150 mg, b-2 150 mg, b-6 150 mg, b-12 @ 150 mg, and niacin 150 mg) Patient not taking: Reported on 07/26/2024    [provider]       Harden Staff MD. FCCP. [facility] Pager : [phone]  If no response to pager , please call [phone] until 7 pm After 7:00 pm call Elink  [phone]   07/26/2024             [1]  Allergies Allergen Reactions   Codeine Nausea Only   "

## 2024-07-27 ENCOUNTER — Encounter (HOSPITAL_COMMUNITY): Payer: Self-pay | Admitting: Internal Medicine

## 2024-07-27 DIAGNOSIS — N3080 Other cystitis without hematuria: Secondary | ICD-10-CM | POA: Diagnosis not present

## 2024-07-27 LAB — BASIC METABOLIC PANEL WITH GFR
Anion gap: 11 (ref 5–15)
BUN: 16 mg/dL (ref 8–23)
CO2: 26 mmol/L (ref 22–32)
Calcium: 8.9 mg/dL (ref 8.9–10.3)
Chloride: 98 mmol/L (ref 98–111)
Creatinine, Ser: 0.63 mg/dL (ref 0.44–1.00)
GFR, Estimated: >60 mL/min
Glucose, Bld: 86 mg/dL (ref 70–99)
Potassium: 4.1 mmol/L (ref 3.5–5.1)
Sodium: 135 mmol/L (ref 135–145)

## 2024-07-27 LAB — CBC WITH DIFFERENTIAL/PLATELET
Abs Immature Granulocytes: 0.02 K/uL (ref 0.00–0.07)
Basophils Absolute: 0 K/uL (ref 0.0–0.1)
Basophils Relative: 1 %
Eosinophils Absolute: 0.2 K/uL (ref 0.0–0.5)
Eosinophils Relative: 6 %
HCT: 32.6 % — ABNORMAL LOW (ref 36.0–46.0)
Hemoglobin: 10.3 g/dL — ABNORMAL LOW (ref 12.0–15.0)
Immature Granulocytes: 1 %
Lymphocytes Relative: 12 %
Lymphs Abs: 0.4 K/uL — ABNORMAL LOW (ref 0.7–4.0)
MCH: 26.8 pg (ref 26.0–34.0)
MCHC: 31.6 g/dL (ref 30.0–36.0)
MCV: 84.7 fL (ref 80.0–100.0)
Monocytes Absolute: 0.2 K/uL (ref 0.1–1.0)
Monocytes Relative: 6 %
Neutro Abs: 2.1 K/uL (ref 1.7–7.7)
Neutrophils Relative %: 74 %
Platelets: 334 K/uL (ref 150–400)
RBC: 3.85 MIL/uL — ABNORMAL LOW (ref 3.87–5.11)
RDW: 13.2 % (ref 11.5–15.5)
WBC: 2.8 K/uL — ABNORMAL LOW (ref 4.0–10.5)
nRBC: 0 % (ref 0.0–0.2)

## 2024-07-27 LAB — PHOSPHORUS: Phosphorus: 3.3 mg/dL (ref 2.5–4.6)

## 2024-07-27 LAB — CREATININE, SERUM
Creatinine, Ser: 0.6 mg/dL (ref 0.44–1.00)
GFR, Estimated: >60 mL/min

## 2024-07-27 LAB — MAGNESIUM: Magnesium: 2 mg/dL (ref 1.7–2.4)

## 2024-07-27 LAB — MRSA NEXT GEN BY PCR, NASAL: MRSA by PCR Next Gen: NOT DETECTED

## 2024-07-27 LAB — I-STAT CG4 LACTIC ACID, ED: Lactic Acid, Venous: 1 mmol/L (ref 0.5–1.9)

## 2024-07-27 MED ORDER — POLYVINYL ALCOHOL 1.4 % OP SOLN: 1.0000 [drp] | OPHTHALMIC | Status: DC | PRN

## 2024-07-27 MED ORDER — PIPERACILLIN-TAZOBACTAM 3.375 G IVPB
3.3750 g | Freq: Three times a day (TID) | INTRAVENOUS | Status: DC
  Administered 2024-07-27 – 2024-07-28 (×4): 3.375 g via INTRAVENOUS
  Filled 2024-07-27 (×4): qty 50

## 2024-07-27 MED ORDER — CHLORHEXIDINE GLUCONATE CLOTH 2 % EX PADS
6.0000 | MEDICATED_PAD | Freq: Every day | CUTANEOUS | Status: DC
  Administered 2024-07-28 – 2024-08-06 (×10): 6 via TOPICAL

## 2024-07-27 MED ORDER — ATORVASTATIN CALCIUM 10 MG PO TABS
10.0000 mg | ORAL_TABLET | Freq: Every day | ORAL | Status: DC
  Administered 2024-07-27 – 2024-08-05 (×11): 10 mg via ORAL
  Filled 2024-07-27 (×11): qty 1

## 2024-07-27 MED ORDER — BUSPIRONE HCL 5 MG PO TABS
7.5000 mg | ORAL_TABLET | Freq: Every day | ORAL | Status: DC
  Administered 2024-07-27 – 2024-08-05 (×11): 7.5 mg via ORAL
  Filled 2024-07-27 (×4): qty 2
  Filled 2024-07-27: qty 1.5
  Filled 2024-07-27 (×6): qty 2

## 2024-07-27 MED ORDER — AMLODIPINE BESYLATE 5 MG PO TABS
5.0000 mg | ORAL_TABLET | Freq: Every day | ORAL | Status: DC
  Administered 2024-07-27: 5 mg via ORAL
  Filled 2024-07-27: qty 1

## 2024-07-27 MED ORDER — METOPROLOL TARTRATE 25 MG PO TABS
25.0000 mg | ORAL_TABLET | Freq: Two times a day (BID) | ORAL | Status: DC
  Administered 2024-07-27 – 2024-08-06 (×21): 25 mg via ORAL
  Filled 2024-07-27 (×21): qty 1

## 2024-07-27 MED ORDER — HYDRALAZINE HCL 20 MG/ML IJ SOLN
10.0000 mg | INTRAMUSCULAR | Status: DC | PRN
  Administered 2024-07-28: 10 mg via INTRAVENOUS
  Filled 2024-07-27: qty 1

## 2024-07-27 MED ORDER — AMLODIPINE BESYLATE 10 MG PO TABS
10.0000 mg | ORAL_TABLET | Freq: Every day | ORAL | Status: DC
  Administered 2024-07-28 – 2024-08-06 (×10): 10 mg via ORAL
  Filled 2024-07-27 (×10): qty 1

## 2024-07-27 NOTE — Telephone Encounter (Signed)
 Contacted Snigdha Howser to see if by chance the insurance told her where we would send the request for this and she said they did not, I have submitted the request to our internal DME group to see if this is something they do.

## 2024-07-27 NOTE — Progress Notes (Signed)
 Michele Tyler   DOB:[birth year]   FM#:[id number]    ASSESSMENT & PLAN:  Ovarian cancer She had received cycle 1 of treatment, complicated by admission, poor performance status, malnutrition and possible infection Continue supportive care  Acquired pancytopenia Due to recent treatment She does not need transfusion or G-CSF support right now  Bilateral pleural effusion She is not symptomatic She does not need thoracentesis right now  Malignant ascites She is not symptomatic She does not need paracentesis right now  Severe chronic constipation She has fecal impaction, status post disimpaction by nursing staff Continue laxatives  Possible early sepsis Continue broad-spectrum IV antibiotics until cultures are negative  Discharge planning She will likely be here for the next 48 hours I am not here tomorrow or new year but will be back on January 2  Almarie Bedford, MD 07/27/2024 5:38 PM  Subjective:  This is a patient well-known to me.  Caregiver by the bedside.  She had received cycle 1 of chemotherapy with carboplatin  and paclitaxel  approximately a week ago.  The patient have also background history of stroke and significant comorbidities She is being admitted for possible early infection/sepsis, fecal impaction, failure to thrive and malnutrition  The patient appears alert but intermittently sleeping Caregiver was able to give additional history and collaborate that the patient has very poor oral intake since recent treatment  I reviewed multiple imaging studies with patient and family  Objective:  Vitals:   07/27/24 1503 07/27/24 1612  BP: (!) 173/91 (!) 165/102  Pulse: 97 75  Resp: (!) 25 18  Temp: (!) 97.4 F (36.3 C) 97.9 F (36.6 C)  SpO2: 96% 100%    No intake or output data in the 24 hours ending 07/27/24 1738

## 2024-07-27 NOTE — Progress Notes (Signed)
 Consultation Progress Note   Patient: Michele Tyler FMW:[id number] DOB: 1946-03-02 DOA: 07/26/2024 DOS: the patient was seen and examined on 07/27/2024 Primary service: DibiaLandon BRAVO, MD  Brief hospital course:  78 y.o. female with medical history significant for newly diagnosed metastatic ovarian cancer status post 1 round of chemotherapy on 07/19/2024,  who presents to the ER from home via EMS with complaint of diarrhea, right-sided abdominal pain and generalized weakness for the past 2 days. Urinalysis showed  many WBCs and bacteria.  CT a/p showed -Severe, diffuse emphysematous cystitis with extensive intramural gas and moderate intraluminal gas, consistent with severe infection and high risk for sepsis, typically seen in immunosuppressed or diabetic patients. -Progressive omental caking with peritoneal thickening and nodularity, consistent with peritoneal carcinomatosis. -Moderate bilateral pleural effusions with compressive atelectasis at the lung bases.  -Left hydronephrosis with distention of the left renal pelvis and decompressed ureter consistent with chronic UPJ obstruction   Urology recommended Foley placement, she was started on IV antibiotics Zosyn    Assessment and Plan: Severe emphysematous cystitis, POA Patient with ovarian caner presents with right sided abd pain and weakness CT a/p concerning for severe emphysematous cystitis. Urology consulted, continue Zosyn , IVF  Follow up Urine and Blood cx Appreciate PCCM input-available PRN.    Newly diagnosed metastatic gynecological malignancy  status post 1 round of chemotherapy on 07/19/2024 Peritoneal Carcinomatosis Recommend close follow-up with medical oncology. Follows with Dr. Viktoria, Gyn-oncology, outpatient.   Hypertension Suboptimally controlled BP PRN Hydralazine 10mg  IV for SBP>170 Norvasc  10 mg daily, Metoprolol  25mg  BID Closely monitor vital signs   Mild leukopenia WBC 2.8, in setting of recent  chemotherapy Closely monitor   Chronic normocytic anemia Hemoglobin 10.3 with no overt bleeding reported. Continue to monitor H&H   Generalized weakness PT OT evaluation Fall precautions.       TR[facility] will continue to follow the patient.  Subjective: Patient is somewhat confused during my encounter, alert to person, time but not place. She reports right sided abdominal pain, RN to give pain meds.  Physical Exam: Vitals:   07/27/24 0400 07/27/24 0513 07/27/24 0600 07/27/24 0958  BP: (!) 174/95  (!) 153/97 (!) 174/95  Pulse: 85  80 98  Resp: 20  15 18   Temp:  (!) 97.5 F (36.4 C)  97.9 F (36.6 C)  TempSrc:  Oral  Oral  SpO2: 100%  100% 96%   General  Cachectic, Ill appearing Eyes: PERRL, lids and conjunctivae normal ENMT: Mucous membranes are moist.   Neck: normal, supple, no masses, no thyromegaly Respiratory: clear to auscultation bilaterally, no wheezing, no crackles. Normal respiratory effort. No accessory muscle use.  Cardiovascular: Regular rate and rhythm, no murmurs / rubs / gallops Abdomen: Soft, Right sided abdominal tenderness. Musculoskeletal: no clubbing / cyanosis. No joint deformity upper and lower extremities.  Neurologic: Facial asymmetry, moving extremity spontaneously, speech fluent. Psychiatric: Normal judgment and insight. Alert and oriented x 2-3. Normal mood.    Diet Orders (From admission, onward)     Start     Ordered   07/26/24 2317  Diet regular Room service appropriate? Yes; Fluid consistency: Thin  Diet effective now       Question Answer Comment  Room service appropriate? Yes   Fluid consistency: Thin      07/26/24 2316            Data Reviewed:    CBC    Component Value Date/Time   WBC 2.8 (L) 07/27/2024 0626   RBC 3.85 (L)  07/27/2024 0626   HGB 10.3 (L) 07/27/2024 0626   HGB 11.4 (L) 07/16/2024 1000   HGB 11.4 06/30/2024 1140   HCT 32.6 (L) 07/27/2024 0626   HCT 35.6 06/30/2024 1140   PLT 334 07/27/2024 0626   PLT 566  (H) 07/16/2024 1000   PLT 735 (H) 06/30/2024 1140   MCV 84.7 07/27/2024 0626   MCV 88 06/30/2024 1140   MCH 26.8 07/27/2024 0626   MCHC 31.6 07/27/2024 0626   RDW 13.2 07/27/2024 0626   RDW 12.6 06/30/2024 1140   LYMPHSABS 0.4 (L) 07/27/2024 0626   LYMPHSABS 0.5 (L) 06/30/2024 1140   MONOABS 0.2 07/27/2024 0626   EOSABS 0.2 07/27/2024 0626   EOSABS 0.0 06/30/2024 1140   BASOSABS 0.0 07/27/2024 0626   BASOSABS 0.0 06/30/2024 1140   CMP     Component Value Date/Time   NA 135 07/27/2024 0626   NA 133 (L) 06/30/2024 1140   K 4.1 07/27/2024 0626   CL 98 07/27/2024 0626   CO2 26 07/27/2024 0626   GLUCOSE 86 07/27/2024 0626   BUN 16 07/27/2024 0626   BUN 12 06/30/2024 1140   CREATININE 0.63 07/27/2024 0626   CREATININE 0.62 07/16/2024 1000   CALCIUM  8.9 07/27/2024 0626   PROT 5.7 (L) 07/26/2024 1933   PROT 6.0 06/30/2024 1140   ALBUMIN  3.4 (L) 07/26/2024 1933   ALBUMIN  3.7 (L) 06/30/2024 1140   AST 35 07/26/2024 1933   AST 21 07/16/2024 1000   ALT 19 07/26/2024 1933   ALT 12 07/16/2024 1000   ALKPHOS 85 07/26/2024 1933   BILITOT 0.3 07/26/2024 1933   BILITOT 0.3 07/16/2024 1000   EGFR 90 06/30/2024 1140   GFRNONAA >60 07/27/2024 0626   GFRNONAA >60 07/16/2024 1000    Family Communication: Friend at bedside states that patient has no family,   Time spent: 36 minutes.  Author: Landon FORBES Baller, MD 07/27/2024 2:18 PM  For on call review [email or web address].

## 2024-07-28 ENCOUNTER — Encounter (HOSPITAL_COMMUNITY): Payer: Self-pay | Admitting: Internal Medicine

## 2024-07-28 DIAGNOSIS — L8990 Pressure ulcer of unspecified site, unspecified stage: Secondary | ICD-10-CM | POA: Insufficient documentation

## 2024-07-28 DIAGNOSIS — C786 Secondary malignant neoplasm of retroperitoneum and peritoneum: Secondary | ICD-10-CM | POA: Diagnosis not present

## 2024-07-28 DIAGNOSIS — N3080 Other cystitis without hematuria: Secondary | ICD-10-CM | POA: Diagnosis not present

## 2024-07-28 DIAGNOSIS — Z7189 Other specified counseling: Secondary | ICD-10-CM | POA: Diagnosis not present

## 2024-07-28 DIAGNOSIS — C569 Malignant neoplasm of unspecified ovary: Secondary | ICD-10-CM | POA: Diagnosis not present

## 2024-07-28 DIAGNOSIS — R180 Malignant ascites: Secondary | ICD-10-CM

## 2024-07-28 DIAGNOSIS — K529 Noninfective gastroenteritis and colitis, unspecified: Secondary | ICD-10-CM

## 2024-07-28 DIAGNOSIS — N1330 Unspecified hydronephrosis: Secondary | ICD-10-CM

## 2024-07-28 DIAGNOSIS — Z515 Encounter for palliative care: Secondary | ICD-10-CM | POA: Diagnosis not present

## 2024-07-28 DIAGNOSIS — R5381 Other malaise: Secondary | ICD-10-CM | POA: Diagnosis not present

## 2024-07-28 DIAGNOSIS — R4589 Other symptoms and signs involving emotional state: Secondary | ICD-10-CM | POA: Diagnosis not present

## 2024-07-28 LAB — URINE CULTURE

## 2024-07-28 MED ORDER — SODIUM CHLORIDE 0.9 % IV SOLN
2.0000 g | INTRAVENOUS | Status: AC
  Administered 2024-07-28 – 2024-07-31 (×4): 2 g via INTRAVENOUS
  Filled 2024-07-28 (×4): qty 20

## 2024-07-28 MED ORDER — ENSURE PLUS HIGH PROTEIN PO LIQD
237.0000 mL | Freq: Two times a day (BID) | ORAL | Status: DC
  Administered 2024-07-28 – 2024-08-06 (×17): 237 mL via ORAL

## 2024-07-28 MED ORDER — POLYETHYLENE GLYCOL 3350 17 G PO PACK
17.0000 g | PACK | Freq: Two times a day (BID) | ORAL | Status: DC
  Administered 2024-07-28 – 2024-08-06 (×8): 17 g via ORAL
  Filled 2024-07-28 (×10): qty 1

## 2024-07-28 MED ORDER — CLOPIDOGREL BISULFATE 75 MG PO TABS
75.0000 mg | ORAL_TABLET | Freq: Every evening | ORAL | Status: DC
  Administered 2024-07-28 – 2024-08-05 (×9): 75 mg via ORAL
  Filled 2024-07-28 (×9): qty 1

## 2024-07-28 MED ORDER — SENNA 8.6 MG PO TABS
1.0000 | ORAL_TABLET | Freq: Every day | ORAL | Status: DC
  Administered 2024-07-28 – 2024-08-04 (×4): 8.6 mg via ORAL
  Filled 2024-07-28 (×6): qty 1

## 2024-07-28 NOTE — Evaluation (Signed)
 Physical Therapy Evaluation Patient Details Name: Michele Tyler MRN: [id number] DOB: 27-Feb-1946 Today's Date: 07/28/2024  History of Present Illness  78 yo female admitted with severe emphysematous cystitis. 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 She was able to stand and step over to [facility] with RW. She was impulsive and demonstrated poor safety awareness and high risk for falls. She was alert and oriented x 1 during session. Caregiver was in room when therapist arrived-provided some background info-reports pt does not currently have 24 hour supervision/assist and that cognitive level varies/fluctuates good days/bad days Unable to assess gait due to patient having bowel incontinence during session. Will plan to follow patient during this hospital stay. Patient will benefit from continued inpatient follow up therapy, <3 hours/day         If plan is discharge home, recommend the following: A lot of help with walking and/or transfers;A lot of help with bathing/dressing/bathroom;Assistance with cooking/housework;Assist for transportation;Help with stairs or ramp for entrance   Can travel by private vehicle        Equipment Recommendations None recommended by PT  Recommendations for Other Services       Functional Status Assessment Patient has had a recent decline in their functional status and demonstrates the ability to make significant improvements in function in a reasonable and predictable amount of time.     Precautions / Restrictions Precautions Precautions: Fall Restrictions Weight Bearing Restrictions Per Provider Order: No      Mobility  Bed Mobility Overal bed mobility: Needs Assistance Bed Mobility: Supine to Sit     Supine to sit: Min assist, +2 for safety/equipment, +2 for physical assistance, HOB elevated     General bed mobility comments: Increased  time. Multiodal cueing required. Assist for trunk and LEs.    Transfers Overall transfer level: Needs assistance Equipment used: Rolling walker (2 wheels) Transfers: Sit to/from Stand, Bed to chair/wheelchair/BSC Sit to Stand: Min assist, +2 physical assistance, +2 safety/equipment   Step pivot transfers: Min assist, +2 physical assistance, +2 safety/equipment       General transfer comment: +2 for safety. Pt impulsive and unsafe. Cues for hand placement, direction, safety, proper operation of RW. Assist to rise, steady, control descent. High fall risk. STS multiple times for toileting hygiene.    Ambulation/Gait               General Gait Details: Unable to attempt due to bowel incontinence.  Stairs            Wheelchair Mobility     Tilt Bed    Modified Rankin (Stroke Patients Only)       Balance Overall balance assessment: Needs assistance Sitting-balance support: Bilateral upper extremity supported, Feet supported Sitting balance-Leahy Scale: Fair     Standing balance support: Bilateral upper extremity supported, During functional activity, Reliant on assistive device for balance Standing balance-Leahy Scale: Poor                               Pertinent Vitals/Pain Pain Assessment Pain Assessment: No/denies pain    Home Living Family/patient expects to be discharged to:: Private residence Living Arrangements: Alone Available Help at Discharge: Personal care attendant Type of Home: House Home Access: Stairs to enter Entrance Stairs-Rails: Left Entrance Stairs-Number of Steps: 1   Home Layout: Two level;1/2 bath on main level;Able to  live on main level with bedroom/bathroom Home Equipment: Grab bars - tub/shower;Rolling Walker (2 wheels);Cane - single point;Rollator (4 wheels)      Prior Function Prior Level of Function : Needs assist             Mobility Comments: using RW and requiring Min A as of 05/2024 (per last PT  session). Prior to that in 04/2024, pt was ambulating without an AD ADLs Comments: prior to previous admission, indep with ADLs/IADLs, was working as a agricultural consultant for the [employer] ; since prior admission and d/c from SNF, pt has had assist from paid caregiver with functional cognition, IADLs, and showering     Extremity/Trunk Assessment   Upper Extremity Assessment Upper Extremity Assessment: Defer to OT evaluation    Lower Extremity Assessment Lower Extremity Assessment: Generalized weakness    Cervical / Trunk Assessment Cervical / Trunk Assessment: Normal  Communication   Communication Communication: No apparent difficulties    Cognition Arousal: Alert Behavior During Therapy: Flat affect   PT - Cognitive impairments: History of cognitive impairments, Safety/Judgement, Problem solving, Initiation, Sequencing, Orientation   Orientation impairments: Time, Place, Situation                     Following commands: Impaired Following commands impaired: Follows one step commands inconsistently, Follows one step commands with increased time     Cueing Cueing Techniques: Verbal cues, Gestural cues, Tactile cues, Visual cues     General Comments      Exercises     Assessment/Plan    PT Assessment Patient needs continued PT services  PT Problem List Decreased strength;Decreased activity tolerance;Decreased balance;Decreased mobility;Decreased knowledge of use of DME;Decreased cognition;Decreased safety awareness       PT Treatment Interventions DME instruction;Gait training;Functional mobility training;Therapeutic activities;Therapeutic exercise;Balance training;Patient/family education    PT Goals (Current goals can be found in the Care Plan section)  Acute Rehab PT Goals Patient Stated Goal: none stated PT Goal Formulation: Patient unable to participate in goal setting Time For Goal Achievement: 08/11/24 Potential to Achieve Goals: Good     Frequency Min 3X/week     Co-evaluation               AM-PAC PT 6 Clicks Mobility  Outcome Measure Help needed turning from your back to your side while in a flat bed without using bedrails?: A Little Help needed moving from lying on your back to sitting on the side of a flat bed without using bedrails?: A Lot Help needed moving to and from a bed to a chair (including a wheelchair)?: A Lot Help needed standing up from a chair using your arms (e.g., wheelchair or bedside chair)?: A Lot Help needed to walk in hospital room?: A Lot Help needed climbing 3-5 steps with a railing? : A Lot 6 Click Score: 13    End of Session Equipment Utilized During Treatment: Gait belt Activity Tolerance: Patient tolerated treatment well Patient left: with call bell/phone within reach;with nursing/sitter in room (on BSC with NT)   PT Visit Diagnosis: Muscle weakness (generalized) (M62.81);Difficulty in walking, not elsewhere classified (R26.2);Unsteadiness on feet (R26.81)    Time: 8459-8384 PT Time Calculation (min) (ACUTE ONLY): 35 min   Charges:   PT Evaluation $PT Eval Low Complexity: 1 Low PT Treatments $Therapeutic Activity: 8-22 mins PT General Charges $$ ACUTE PT VISIT: 1 Visit           Dannial SQUIBB, PT Acute Rehabilitation  Office: [phone]

## 2024-07-28 NOTE — Hospital Course (Addendum)
 78 year old woman PMH newly diagnosed ovarian cancer with metastatic disease, status post 1 round chemotherapy 12/22, presented with weakness, diarrhea, abdominal pain.  CT showed severe diffuse emphysematous cystitis consistent with severe infection high risk for sepsis.  Treated for cystitis.  Seen by oncology.  Outpatient treatment plans are unclear at this time.  Plan for SNF and follow-up with oncology.  Consultants PCCM Palliative Medicine Urology  Procedures/Events 12/29 admission for severe emphysematous cystitis

## 2024-07-28 NOTE — Progress Notes (Signed)
" °   07/28/24 1000  Spiritual Encounters  Type of Visit Initial  Care provided to: Patient  Referral source Other (comment) (Spiritual Consult)  Reason for visit Advance directives  OnCall Visit No   Chaplain responded to a request for advanced directive education. I met with the patient Michele Tyler and went over the documents. Michele Tyler stated that she would work on them at a later time.  I advised her that if she wanted to talk with a chaplain later in her stay or any questions came up with her advanced directive to let her nurse know. A chaplain would respond.   Carley [place] [facility]  [phone]  "

## 2024-07-28 NOTE — Plan of Care (Signed)

## 2024-07-28 NOTE — Consult Note (Signed)
 " Consultation Note Date: 07/28/2024   Patient Name: Michele Tyler  DOB: 1946/06/14  MRN: [id number]  Age / Sex: 78 y.o., female   PCP: [name], MD Referring Physician: Jadine Toribio SQUIBB, MD  Reason for Consultation: Establishing goals of care     Chief Complaint/History of Present Illness:   Patient is a 78 year old woman with a past medical history of newly diagnosed ovarian cancer that is at minimum stage III with involvement of peritoneum was admitted on 07/26/2024 status post round 1 of chemotherapy on 07/19/2024 for management of weakness, diarrhea, and abdominal pain.  Imaging upon admission showed severe emphysematous cystitis consistent with severe infection and high risk for sepsis.  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  Oncology and PCCM consulted for recommendations.  Palliative medicine team consulted to assist with complex medical decision making.  Extensive review of EMR including recent documentation from PCCM, oncology, and outpatient GYN.  Patient had been seen by Gyn Onc, Dr. Viktoria, on 07/16/2024.  Reviewed recent CBC noting WBC low at 2.8.  Personally reviewed recent CT AP noting severe diffuse emphysematous cystitis; progressive omental caking with peritoneal thickening and nodularity consistent with peritoneal carcinomatosis; concerns for infectious or inflammatory colitis without bowel obstruction; large volume ascites; moderate bilateral pleural effusions with compressive atelectasis; left hydronephrosis. Discussed care with hospitalist for medical updates.  Patient having episodes of confusion.  No ACP documentation listed on file.  Patient has multiple friends and caregiver listed as emergency contacts in EMR.  Hospitalist had updated friend at bedside today.  Discussed would place consult for TOC to background check  to make sure patient does not have any family who would technically be next of kin without patient having ACP documentation.  Presented to bedside to see patient.  Patient sleeping soundly.  Did not awaken as did not want to worsen reported confusion.  No visitors were present at bedside to discuss patient's care.  Reached out to Gyn Onc provider, Dr. Viktoria, to discuss care.  Initial plan for this diagnosis had been 3 cycles of chemotherapy followed by repeat imaging in assessing if patient will be a candidate for surgery at that time.  Noted Dr. Lonn saw patient on 07/27/2024 and noted concerns about complications from chemotherapy including admission, poor performance status, malnutrition, and possible infection.  Currently continuing supportive care to allow time for outcomes.  Dr. Viktoria noted plan to follow-up with patient and Dr. Lonn to discuss care potentially on 07/31/23. Dr. Lonn had noted she would be returning on that date as well.  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  Tobacco Use   Smoking status: Former    Current packs/day: 0.00    Average packs/day: 1 pack/day for 10.0 years (10.0 ttl pk-yrs)    Types: Cigarettes    Start date: 07/29/1976  Quit date: 79    Years since quitting: 38.0   Smokeless tobacco: Never  Vaping Use   Vaping status: Never Used  Substance and Sexual Activity   Alcohol  use: Yes    Alcohol /week: 1.0 - 2.0 standard drink of alcohol      Types: 1 - 2 Standard drinks or equivalent per week    Comment: social   Drug use: No   Sexual activity: Not Currently  Other Topics Concern   Not on file  Social History Narrative   Not on file   Social Drivers of Health   Tobacco Use: Medium Risk (07/16/2024)   Patient History    Smoking Tobacco Use: Former    Smokeless Tobacco Use: Never    Passive Exposure: Not on Actuary Strain: Low Risk (08/04/2023)   Overall Financial Resource Strain (CARDIA)    Difficulty of Paying Living Expenses: Not hard at all  Food Insecurity: No Food Insecurity (07/27/2024)   Epic    Worried About Programme Researcher, Broadcasting/film/video in the Last Year: Never true    Ran Out of Food in the Last Year: Never true  Transportation Needs: No Transportation Needs (07/27/2024)   Epic    Lack of Transportation (Medical): No    Lack of Transportation (Non-Medical): No  Physical Activity: Sufficiently Active (08/04/2023)   Exercise Vital Sign    Days of Exercise per Week: 6 days    Minutes of Exercise per Session: 90 min  Stress: Stress Concern Present (08/04/2023)   [employer] of Occupational Health - Occupational Stress Questionnaire    Feeling of Stress : Very much  Social Connections: Unknown (07/27/2024)   Social Connection and Isolation Panel    Frequency of Communication with Friends and Family: Three times a week    Frequency of Social Gatherings with Friends and Family: Three times a week    Attends Religious Services: Never    Active Member of Clubs or Organizations: Patient declined    Attends Banker Meetings: Patient declined    Marital Status: Divorced  Recent Concern: Social Connections - Moderately Isolated (06/22/2024)   Social Connection and Isolation Panel    Frequency of Communication with Friends and Family: Three times a week    Frequency of Social Gatherings with Friends and Family: Once a week    Attends Religious Services: Never    Database Administrator or  Organizations: Yes    Attends Banker Meetings: 1 to 4 times per year    Marital Status: Divorced  Depression (PHQ2-9): High Risk (06/09/2024)   Depression (PHQ2-9)    PHQ-2 Score: 11  Alcohol  Screen: Low Risk (08/04/2023)   Alcohol  Screen    Last Alcohol  Screening Score (AUDIT): 2  Housing: Low Risk (07/27/2024)   Epic    Unable to Pay for Housing in the Last Year: No    Number of Times Moved in the Last Year: 0    Homeless in the Last Year: No  Utilities: Not At Risk (07/27/2024)   Epic    Threatened with loss of utilities: No  Health Literacy: Not on file   Family History  Problem Relation Age of Onset   Hypertension Mother    Depression Father    Hypertension Father    Scheduled Meds:  amLODipine   10 mg Oral Daily   atorvastatin   10 mg Oral QHS   busPIRone   7.5 mg Oral QHS   Chlorhexidine Gluconate Cloth  6 each Topical Daily  feeding supplement  237 mL Oral BID BM   heparin   5,000 Units Subcutaneous Q8H   metoprolol  tartrate  25 mg Oral BID   polyethylene glycol  17 g Oral BID   senna  1 tablet Oral QHS   Continuous Infusions:  piperacillin -tazobactam 3.375 g (07/28/24 0836)   PRN Meds:.acetaminophen , artificial tears, hydrALAZINE, HYDROmorphone (DILAUDID) injection, melatonin, oxyCODONE , prochlorperazine  Allergies[1] CBC:    Component Value Date/Time   WBC 2.8 (L) 07/27/2024 0626   HGB 10.3 (L) 07/27/2024 0626   HGB 11.4 (L) 07/16/2024 1000   HGB 11.4 06/30/2024 1140   HCT 32.6 (L) 07/27/2024 0626   HCT 35.6 06/30/2024 1140   PLT 334 07/27/2024 0626   PLT 566 (H) 07/16/2024 1000   PLT 735 (H) 06/30/2024 1140   MCV 84.7 07/27/2024 0626   MCV 88 06/30/2024 1140   NEUTROABS 2.1 07/27/2024 0626   NEUTROABS 6.1 06/30/2024 1140   LYMPHSABS 0.4 (L) 07/27/2024 0626   LYMPHSABS 0.5 (L) 06/30/2024 1140   MONOABS 0.2 07/27/2024 0626   EOSABS 0.2 07/27/2024 0626   EOSABS 0.0 06/30/2024 1140   BASOSABS 0.0 07/27/2024 0626   BASOSABS 0.0 06/30/2024  1140   Comprehensive Metabolic Panel:    Component Value Date/Time   NA 135 07/27/2024 0626   NA 133 (L) 06/30/2024 1140   K 4.1 07/27/2024 0626   CL 98 07/27/2024 0626   CO2 26 07/27/2024 0626   BUN 16 07/27/2024 0626   BUN 12 06/30/2024 1140   CREATININE 0.63 07/27/2024 0626   CREATININE 0.62 07/16/2024 1000   GLUCOSE 86 07/27/2024 0626   CALCIUM  8.9 07/27/2024 0626   AST 35 07/26/2024 1933   AST 21 07/16/2024 1000   ALT 19 07/26/2024 1933   ALT 12 07/16/2024 1000   ALKPHOS 85 07/26/2024 1933   BILITOT 0.3 07/26/2024 1933   BILITOT 0.3 07/16/2024 1000   PROT 5.7 (L) 07/26/2024 1933   PROT 6.0 06/30/2024 1140   ALBUMIN  3.4 (L) 07/26/2024 1933   ALBUMIN  3.7 (L) 06/30/2024 1140    Physical Exam: Vital Signs: BP 138/81   Pulse (!) 110   Temp 97.6 F (36.4 C) (Oral)   Resp 20   Ht 5' 4 (1.626 m)   Wt 49 kg   SpO2 92%   BMI 18.54 kg/m  SpO2: SpO2: 92 % O2 Device: O2 Device: Room Air O2 Flow Rate: O2 Flow Rate (L/min): 2 L/min Intake/output summary:  Intake/Output Summary (Last 24 hours) at 07/28/2024 1422 Last data filed at 07/28/2024 1100 Gross per 24 hour  Intake 360 ml  Output 1150 ml  Net -790 ml   LBM: Last BM Date : 07/28/24 Baseline Weight: Weight: 49 kg Most recent weight: Weight: 49 kg  General: NAD, sleeping comfortably, chronically ill-appearing Cardiovascular: Tachycardia noted Respiratory: no increased work of breathing noted, not in respiratory distress Abdomen: distended         Palliative Performance Scale: 30%              Additional Data Reviewed: Recent Labs    07/26/24 1933 07/26/24 2330 07/27/24 0626  WBC 3.4*  --  2.8*  HGB 10.7*  --  10.3*  PLT 364  --  334  NA 137  --  135  BUN 19  --  16  CREATININE 0.54 0.60 0.63    Imaging: CT ABDOMEN PELVIS W CONTRAST Addendum: ** ADDENDUM #1 **  ADDENDUM:  I discussed these findings with Dr. patsey in the emergency room at  10:13 pm   Electronically signed by: Dorethia Molt  MD 07/26/2024 10:14 PM EST RP  Workstation: [id number] Narrative: ** ORIGINAL REPORT * EXAM: CT ABDOMEN AND PELVIS WITH CONTRAST 07/26/2024 08:49:59 PM  TECHNIQUE: CT of the abdomen and pelvis was performed with the administration of intravenous contrast. Multiplanar reformatted images are provided for review. Automated exposure control, iterative reconstruction, and/or weight-based adjustment of the mA/kV was utilized to reduce the radiation dose to as low as reasonably achievable.  COMPARISON: 06/22/2024  CLINICAL HISTORY: Abdominal pain, acute, nonlocalized. Diarrhea and generalized weakness.  FINDINGS:  LOWER CHEST: Moderate bilateral pleural effusions were again identified with compressive atelectasis of the lung bases bilaterally. Moderate hiatal hernia.  LIVER: Stable periportal edema. Simple cysts are seen scattered throughout the liver, unchanged.  GALLBLADDER AND BILE DUCTS: Gallbladder is unremarkable. No biliary ductal dilatation.  SPLEEN: No acute abnormality.  PANCREAS: No acute abnormality.  ADRENAL GLANDS: No acute abnormality.  KIDNEYS, URETERS AND BLADDER: Simple cortical cysts are again identified within the kidneys bilaterally for which follow-up imaging is recommended. There is mild left hydronephrosis and distention of the left renal pelvis with decompression of the ureter in keeping with an underlying UPJ obstruction, stable since prior examination. Preserved left renal cortical thickness. The kidneys are otherwise unremarkable. There is extensive intramural gas circumferentially within the bladder with extension of gas into the space of Retzius in keeping with severe, diffuse emphysematous cystitis, a finding seen with severe infection in typically immunosuppressed or diabetic patients. Moderate gas is seen in the bladder lumen.  GI AND BOWEL: Stomach demonstrates no acute abnormality. Impacted stool is seen within the rectal vault  reflecting changes of fecal impaction. There is superimposed circumferential bowel wall thickening and edema involving the sigmoid colon suggesting an infectious or inflammatory colitis. No obstruction.  PERITONEUM AND RETROPERITONEUM: The large volume ascites is again identified, improved since prior examination. There is, however, progressive omental caking and peritoneal thickening and nodularity identified in keeping with peritoneal carcinomatosis. No free intraperitoneal gas.  VASCULATURE: Aorta is normal in caliber.  LYMPH NODES: No lymphadenopathy. Delay more iterations of the cough REPRODUCTIVE ORGANS: Large calcified intramural fibroid within the uterus.  BONES AND SOFT TISSUES: No acute osseous abnormality. No focal soft tissue abnormality.  IMPRESSION: 1. Severe, diffuse emphysematous cystitis with extensive intramural gas and moderate intraluminal gas, consistent with severe infection and high risk for sepsis, typically seen in immunosuppressed or diabetic patients.  Progressive omental caking with peritoneal thickening and nodularity, consistent with peritoneal carcinomatosis. Superimposed circumferential bowel wall thickening and edema involving the sigmoid colon, suggesting infectious or inflammatory colitis; no bowel obstruction. Large volume ascites, improved since prior examination. Moderate bilateral pleural effusions with compressive atelectasis at the lung bases. Left hydronephrosis with distention of the left renal pelvis and decompressed ureter consistent with chronic UPJ obstruction; left renal cortical thickness preserved. Multiple simple hepatic cysts and bilateral simple cortical renal cysts, unchanged; no follow-up recommended for simple cysts.  Electronically signed by: Dorethia Molt MD 07/26/2024 10:05 PM EST RP Workstation: [id number]    I personally reviewed recent imaging.   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  Imaging upon admission showed severe emphysematous cystitis  consistent with severe infection and high risk for sepsis.  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  Oncology and PCCM consulted for recommendations.  Palliative medicine team consulted to assist with complex medical decision making.  # Complex medical decision making/goals of care  - Discussed care with team to coordinate care.  Patient has been confused.  Did not engage patient in complex medical decision making discussions today.  Placed consult for TOC to make sure patient does not have any NOK who by [place]  state law would be the appropriate decision makers.  Patient does have multiple friends/caregiver listed as emergency contacts.  Based on TOC findings, may need to engage them in assisting with medical decision making for the patient.  Palliative medicine team continuing to follow along with patient's medical journey.   - Discussed care with Gyn Onc provider, Dr. Viktoria, who plans to follow-up potentially 07/30/24. Dr. Lonn also planning on follow-up at that time.  Continuing appropriate supportive medical care at this time.  -  Code Status: Full Code  See: [place]  General Statutes Chapter 90. Medicine and Allied Occupations  90-21.13. Informed consent to health care treatment or procedure. In brief summary, this statute outlines that the following persons, in order indicated, are authorized to consent to medical treatment on behalf of the patient  who does not demonstrate capacity to do so: Legally assigned guardian appointed by the court > healthcare agent appointed by legal healthcare power of attorney > healthcare agent appointed by the patient > legal spouse > majority of the patient's reasonably available parents and children who are at least 42 years of age > majority of reasonably available siblings over 46 ears of age > individual who has an established relationship with the patient, who is acting in good faith on behalf of the patient, and who can reliably convey the patient's wishes > attending physician with confirmation by another physician of the patient's condition and the necessity for treatment (unless in urgent/emergent situation).    # Discharge Planning:  To Be Determined  Thank you for allowing the palliative care team to participate in the care [name].  Tinnie Radar, DO Palliative Care Provider PMT # [phone]  If patient remains symptomatic despite maximum doses, please call PMT at [phone] between 0700 and 1900. Outside of these hours, please call attending, as PMT does not have night coverage.  Billing based on MDM: High  Problems Addressed: One or more chronic illnesses with severe exacerbation, progression, or side effects of treatment.  Amount and/or Complexity of Data: Category 2:Independent interpretation of a test performed by another physician/other qualified health care professional (not separately reported) and Category 3:Discussion of management or test interpretation with external physician/other qualified health care professional/appropriate source (not separately reported)     [1]  Allergies Allergen Reactions   Codeine Nausea Only   "

## 2024-07-28 NOTE — Plan of Care (Signed)
  Problem: Education: Goal: Knowledge of General Education information will improve Description: Including pain rating scale, medication(s)/side effects and non-pharmacologic comfort measures Outcome: Progressing   Problem: Clinical Measurements: Goal: Ability to maintain clinical measurements within normal limits will improve Outcome: Progressing   Problem: Coping: Goal: Level of anxiety will decrease Outcome: Progressing   

## 2024-07-28 NOTE — Progress Notes (Addendum)
 " Progress Note   Patient: Michele Tyler FMW:[id number] DOB: 11-Mar-1946 DOA: 07/26/2024     2 DOS: the patient was seen and examined on 07/28/2024   Brief hospital course: 78 year old woman PMH newly diagnosed ovarian cancer with metastatic disease, status post 1 round chemotherapy 12/22, presented with weakness, diarrhea, abdominal pain.  CT showed severe diffuse emphysematous cystitis consistent with severe infection high risk for sepsis.  Consultants PCCM  Procedures/Events 12/29 admission for severe emphysematous cystitis  Assessment and Plan: Severe emphysematous cystitis present on admission No signs or symptoms of sepsis Hemodynamic stable.  Continue empiric antibiotics. 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 Lonn, Dr. Viktoria.  Status post 1 cycle of treatment complicated by poor performance status, malnutrition. Urology consulted by EDP recommended Foley catheter Effusion and ascites asymptomatic.  No need for centesis now. Urine culture unrevealing.  Blood cultures no growth today. Continue empiric antibiotics Will reach out to urology for any further recommendations  Acquired pancytopenia Secondary to chemotherapy.  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  Not presently able to make decisions about her own healthcare.  Essential hypertension Continue Norvasc , metoprolol   Possible colitis Chronic constipation Fecal impaction Bowel regimen Disimpact as needed  Goals of care Complex situation in this patient with confusion since October with diagnosis of stroke, reportedly no family or healthcare power of attorney.  Patient had initiated some paperwork to designate a POA, but  this is not complete, and at least at the present date she would not be able to sign it legally.  Long discussion with friend Michele Tyler at bedside, there is a good network of very close friends for over 50 years who are involved.  Patient had desired a former employer of hers to be POA.  Will consult palliative care  Therapy evaluation     Subjective:  Feels poorly, vague complaints, somewhat confused and not able to offer clear history.  Physical Exam: Vitals:   07/28/24 0022 07/28/24 0419 07/28/24 0554 07/28/24 0648  BP: (!) 169/89 (!) 164/100 (!) 166/92 (!) 152/78  Pulse: 90 97 87 (!) 109  Resp:      Temp: 97.9 F (36.6 C) 97.7 F (36.5 C)    TempSrc:      SpO2: 92% 92%  93%  Weight:      Height:       Physical Exam Vitals reviewed.  Constitutional:      General: She is not in acute distress.    Appearance: She is ill-appearing (Chronically). She is not toxic-appearing.  Cardiovascular:     Rate and Rhythm: Normal rate and regular rhythm.     Heart sounds: No murmur heard. Pulmonary:     Effort: Pulmonary effort is normal. No respiratory distress.     Breath sounds: No wheezing, rhonchi or rales.  Abdominal:     General: There is distension.     Palpations: Abdomen is soft.     Tenderness: There is no abdominal tenderness.  Musculoskeletal:     Right lower leg: No edema.     Left lower leg: No edema.  Neurological:     Mental Status: She is alert.     Comments: Disoriented, when asked why she is here because I have the flu, disoriented to location, month and year.  Psychiatric:  Mood and Affect: Mood normal.        Behavior: Behavior normal.     Data Reviewed: No new data  Family Communication: Patient has no family, no POA; spoke with Morton Tyler (friend) at beside  Disposition: Status is: Inpatient Remains inpatient appropriate because: cystitis     Time spent: 35 minutes  Author: Toribio Door, MD 07/28/2024 1:53 PM  For on  call review [email or web address].    "

## 2024-07-28 NOTE — Progress Notes (Signed)
 Report called and given to Zebedee, RN on 3 [address]. Patient to be transferred momentarily.

## 2024-07-29 DIAGNOSIS — Z7189 Other specified counseling: Secondary | ICD-10-CM | POA: Diagnosis not present

## 2024-07-29 DIAGNOSIS — C786 Secondary malignant neoplasm of retroperitoneum and peritoneum: Secondary | ICD-10-CM

## 2024-07-29 DIAGNOSIS — N3080 Other cystitis without hematuria: Secondary | ICD-10-CM | POA: Diagnosis not present

## 2024-07-29 DIAGNOSIS — R4589 Other symptoms and signs involving emotional state: Secondary | ICD-10-CM

## 2024-07-29 DIAGNOSIS — Z515 Encounter for palliative care: Secondary | ICD-10-CM | POA: Diagnosis not present

## 2024-07-29 DIAGNOSIS — C569 Malignant neoplasm of unspecified ovary: Secondary | ICD-10-CM | POA: Diagnosis not present

## 2024-07-29 DIAGNOSIS — N1330 Unspecified hydronephrosis: Secondary | ICD-10-CM | POA: Diagnosis not present

## 2024-07-29 LAB — BASIC METABOLIC PANEL WITH GFR
Anion gap: 11 (ref 5–15)
BUN: 20 mg/dL (ref 8–23)
CO2: 25 mmol/L (ref 22–32)
Calcium: 8.7 mg/dL — ABNORMAL LOW (ref 8.9–10.3)
Chloride: 96 mmol/L — ABNORMAL LOW (ref 98–111)
Creatinine, Ser: 0.62 mg/dL (ref 0.44–1.00)
GFR, Estimated: >60 mL/min
Glucose, Bld: 96 mg/dL (ref 70–99)
Potassium: 2.9 mmol/L — ABNORMAL LOW (ref 3.5–5.1)
Sodium: 133 mmol/L — ABNORMAL LOW (ref 135–145)

## 2024-07-29 LAB — CBC
HCT: 31.2 % — ABNORMAL LOW (ref 36.0–46.0)
Hemoglobin: 10.5 g/dL — ABNORMAL LOW (ref 12.0–15.0)
MCH: 26.7 pg (ref 26.0–34.0)
MCHC: 33.7 g/dL (ref 30.0–36.0)
MCV: 79.4 fL — ABNORMAL LOW (ref 80.0–100.0)
Platelets: 389 K/uL (ref 150–400)
RBC: 3.93 MIL/uL (ref 3.87–5.11)
RDW: 13.2 % (ref 11.5–15.5)
WBC: 1.9 K/uL — ABNORMAL LOW (ref 4.0–10.5)
nRBC: 0 % (ref 0.0–0.2)

## 2024-07-29 MED ORDER — POTASSIUM CHLORIDE CRYS ER 20 MEQ PO TBCR
40.0000 meq | EXTENDED_RELEASE_TABLET | ORAL | Status: AC
  Administered 2024-07-29 (×2): 40 meq via ORAL
  Filled 2024-07-29 (×2): qty 2

## 2024-07-29 NOTE — Progress Notes (Signed)
" °  Progress Note   Patient: Michele Tyler FMW:[id number] DOB: [birth year] DOA: 07/26/2024     3 DOS: the patient was seen and examined on 07/29/2024   Brief hospital course: 79 year old woman PMH newly diagnosed ovarian cancer with metastatic disease, status post 1 round chemotherapy 12/22, presented with weakness, diarrhea, abdominal pain.  CT showed severe diffuse emphysematous cystitis consistent with severe infection high risk for sepsis.  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  Continue empiric antibiotics.  Urine culture unrevealing.  Blood cultures no growth today. 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 Lonn, Dr. Viktoria.  Status post 1 cycle of treatment complicated by poor performance status, malnutrition. Urology consulted by EDP recommended Foley catheter, discussed with Dr. Devere 12/31 who advised no acute intervention required given normal creatinine and ongoing urine output. Effusion and ascites asymptomatic.  No need for centesis now.   Acquired pancytopenia Secondary to chemotherapy.  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  Not presently able to make decisions about her own healthcare.   Essential hypertension Continue Norvasc , metoprolol    Possible colitis Chronic constipation Fecal impaction Continue bowel regimen Disimpact as needed  Complex goals of care, appreciate palliative Dr. Clayton.  Will reconvene tomorrow, discussed with Dr. Viktoria, Dr. Lonn    Subjective:  Feels  okay today  Physical Exam: Vitals:   07/28/24 0648 07/28/24 1357 07/28/24 2046 07/29/24 0526  BP: (!) 152/78 138/81 126/68 (!) 143/88  Pulse: (!) 109 (!) 110 (!) 107 92  Resp:  20 17 17   Temp:  97.6 F (36.4 C) 97.8 F (36.6 C) 97.9 F (36.6 C)  TempSrc:  Oral Oral Oral  SpO2: 93% 92% 93% 93%  Weight:      Height:       Physical Exam Vitals reviewed.  Constitutional:      General: She is not in acute distress.    Appearance: She is not ill-appearing (Chronically) or toxic-appearing.  Cardiovascular:     Rate and Rhythm: Normal rate and regular rhythm.     Heart sounds: No murmur heard. Pulmonary:     Effort: Pulmonary effort is normal. No respiratory distress.     Breath sounds: No wheezing, rhonchi or rales.  Neurological:     Mental Status: She is alert.  Psychiatric:        Mood and Affect: Mood normal.        Behavior: Behavior normal.     Data Reviewed: Sodium 133  Potassium 2.9, will replete WBC down, 1.9 Hemoglobin stable 10.5  Family Communication: friend [name] at bedside  Disposition: Status is: Inpatient Remains inpatient appropriate because: cystits     Time spent: 20 minutes  Author: Toribio Door, MD 07/29/2024 1:10 PM  For on call review [email or web address].    "

## 2024-07-29 NOTE — Progress Notes (Signed)
 " Daily Progress Note   Patient Name: Michele Tyler       Date: 07/29/2024 DOB: 05-30-1946  Age: 79 y.o. MRN#: [id number] Attending Physician: Jadine Toribio SQUIBB, MD Primary Care Physician: [name], MD Admit Date: 07/26/2024 Length of Stay: 3 days  Reason for Consultation/Follow-up: Establishing goals of care  Subjective:   Reviewed EMR including recent documentation from hospitalist and PT/OT.  During PT evaluation on 07/28/2024, patient was impulsive and demonstrated poor safety awareness and was high risk of falls; noted to be alert and oriented x 1 during the session.  Reviewed recent CBC noting patient's WBCs noted to be lower than yesterday at 1.9.  Presented to bedside to meet with patient.  Patient lying in bed at times engaging with conversation and at other times easily falling asleep.  Patient's friend, [name], present at bedside.  Introduced myself as a member of the palliative medicine team and my role in patient's medical journey.  Attempted to engage patient in conversation as able.  Patient not able to initially discuss reason for being here in the hospital being related to infection and underlying cancer.  Patient was able to state she remembered discussing care previously and would Dr. Viktoria and Dr. Lonn and knew they were involved in cancer care though unsure what medical plan had previously been regarding her cancer. Discussed care with [name] as well throughout conversation to obtain verification and further information about what patient was stating.  [name] noted that she, [name], and [name] are friends of the patient.  [name] has noted patient for over 50 years.  [name] stated that neither she, [name], nor [name] are paid caregivers for the patient, just friends.  Asked to determine about April Chance who is listed in patient's chart, and was informed she is paid caregiver of the patient. April has been supporting patient's care from October 2025  approximately. [name] noted patient is divorced, did not have any children, and does not have any siblings and her parents are now deceased.  Based on the [place]  decision making guidelines, decision makers for the patient would appropriately follow into the category of  individual with established relationship with patient who is acting in good faith and can reliably convey patient's wishes.  This would apply to patient's friends [name], and [name]. Does not normally apply to paid caregivers as that can create conflict of interest if someone is being paid and would benefit from a person having a life prolonged even if that was not patient's wishes.  Did ask patient herself who she would want to make medical decisions on her behalf and she noted, [name]. [name] confirmed patient had previously stated this as well though had never completed documentation for it.  Discussed if patient's mentation continues to improve and she is deemed to have capacity regarding this decision, could complete HCPOA documentation naming [name] to avoid further confusion about appropriate medical decision maker. Deane and [name] acknowledged this.  Spent time learning about patient's medical care up and at this point.  Learned about longterm friendship between Leslie and [place].  This included lots of ups and downs in their life's that they have supported each other through.  Spent time providing emotional support via active listening.  Empathized with difficult situation.  Began to discuss care planning moving forward.  Noted had already spoken with patient's Gyn Onc provider, Dr. Viktoria, about what prior care planning had been.  Discussed that Dr. Viktoria and Dr. Lonn were planning to  hopefully follow-up with the patient on 07/30/24.  Expressed that based on their recommendations, could further direct reasonably available medical pathways moving forward.  Noted if patient is a candidate for further cancer  directed therapies and wanted to continue with aggressive medical management, would likely need PT/OT to be continued in the rehab setting so could regain enough strength to follow-up for outpatient chemotherapy.  Also discussed that if patient is not an appropriate candidate for cancer directed therapies due to concerns related to adverse effects from chemo or poor functional status, would then need to discuss how to focus on patient's symptom management and comfort and potentially involve hospice support at that time.  Spent time discussing basics of home hospice versus home palliative.  Noted should patient be deemed not a candidate for further cancer directed therapies, home hospice support would be the most supportive and inclusive care patient could get at home to focus on symptom management at end-of-life. [name] acknowledged this.    Answered questions as able at that time.  Noted palliative medicine team would continue to follow along with patient's medical journey to assist with care coordination and planning moving forward.  Discussed care with hospitalist, PT/OT, and bedside RN to coordinate care and medical updates.   Objective:   Vital Signs:  BP (!) 143/88 (BP Location: Right Arm)   Pulse 92   Temp 97.9 F (36.6 C) (Oral)   Resp 17   Ht 5' 4 (1.626 m)   Wt 49 kg   SpO2 93%   BMI 18.54 kg/m   Physical Exam: General: NAD, awake, confused at times, chronically ill-appearing, frail, cachectic Cardiovascular: RRR Respiratory: no increased work of breathing noted, not in respiratory distress Neuro: A&Ox 2, confused at times during interaction  Assessment & Plan:   Assessment: Patient is a 79 year old woman with a past medical history of newly diagnosed ovarian cancer that is at minimum stage III with involvement of peritoneum was admitted on 07/26/2024 status post round 1 of chemotherapy on 07/19/2024 for management of weakness, diarrhea, and abdominal pain.  Imaging upon  admission showed severe emphysematous cystitis consistent with severe infection and high risk for sepsis.  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  Oncology and PCCM consulted for recommendations.  Palliative medicine team consulted to assist with complex medical decision making.   Recommendations/Plan: # Complex medical decision making/goals of care:       - Discussed care at patient's bedside with patient's friend, [name], present as detailed above in HPI.  Patient does not have prior HCPOA documentation though has discussed naming friend, [name], as HCPOA.  If patient's mentation continues to improve to the point where it is deemed she has capacity to make this decision, would recommend completion of ACP documentation naming HCPOA.  Currently based on [place] guidelines, patient falls into the category of having individual with established relationship with patient who is acting in good faith and can reliably convey patient's wishes which would include [name], and [name].  Patient does have a paid caregiver, April, no expressed with patient paying April for her care, could create conflict of interest since a paid caregiver would benefit from patient living as long as possible even if that would not align with patient's wishes for medical care.  -Did also consult TOC to confirm patient has no further NOK. [name] stating patient is divorced, does not have any children, does  not have any siblings, and her parents are no longer alive. At this time continuing appropriate medical interventions.  Dr. Lonn and Dr. Viktoria planning to follow-up on 07/30/24 to discuss and help in determining if patient is appropriate for further cancer directed therapies as this would influence pathways for medical care moving forward such as aggressive medical pathway  continuing PT/OT and considering rehab for outpatient cancer directed therapy versus needing to focus on patient's comfort with hospice support.  Palliative medicine team continuing to follow with patient's medical journey.                -  Code Status: Full Code  See: [place]  General Statutes Chapter 90. Medicine and Allied Occupations  90-21.13. Informed consent to health care treatment or procedure. In brief summary, this statute outlines that the following persons, in order indicated, are authorized to consent to medical treatment on behalf of the patient who does not demonstrate capacity to do so: Legally assigned guardian appointed by the court > healthcare agent appointed by legal healthcare power of attorney > healthcare agent appointed by the patient > legal spouse > majority of the patient's reasonably available parents and children who are at least 33 years of age > majority of reasonably available siblings over 77 ears of age > individual who has an established relationship with the patient, who is acting in good faith on behalf of the patient, and who can reliably convey the patient's wishes > attending physician with confirmation by another physician of the patient's condition and the necessity for treatment (unless in urgent/emergent situation).     # Discharge Planning: To Be Determined  Discussed with: Patient, patient's friend [name] at bedside, hospitalist, bedside RN, PT/OT  Thank you for allowing the palliative care team to participate in the care [name].  Tinnie Radar, DO Palliative Care Provider PMT # [phone]  If patient remains symptomatic despite maximum doses, please call PMT at [phone] between 0700 and 1900. Outside of these hours, please call attending, as PMT does not have night coverage.  Personally spent 55 minutes in patient care including extensive chart review (labs, imaging, progress/consult notes, vital signs), medically appropraite exam,  discussed with treatment team, education to patient, family, and staff, documenting clinical information, medication review and management, coordination of care, and available advanced directive documents.   "

## 2024-07-29 NOTE — Evaluation (Signed)
 Occupational Therapy Evaluation Patient Details Name: Michele Tyler MRN: [id number] DOB: [birth year] Today's Date: 07/29/2024   History of Present Illness   79 yo female admitted with severe emphysematous cystitis. Hx of CVA 04/2024, ovarian cancer with mets, depression, anxiety, constipation     Clinical Impressions Pt admitted with the above concerns. Pt currently with functional limitations due to the deficits listed below (see OT Problem List). Just prior to admit, pt was living at home alone with caregiver assistance for daily tasks as needed.  Pt will benefit from acute skilled OT to increase their safety and independence with ADL and functional mobility for ADL to facilitate discharge. Patient will benefit from continued inpatient follow up therapy, <3 hours/day.       If plan is discharge home, recommend the following:   A lot of help with walking and/or transfers;A lot of help with bathing/dressing/bathroom     Functional Status Assessment   Patient has had a recent decline in their functional status and demonstrates the ability to make significant improvements in function in a reasonable and predictable amount of time.     Equipment Recommendations   Other (comment) (defer to next venue of care)      Precautions/Restrictions   Precautions Precautions: Fall Recall of Precautions/Restrictions: Impaired Restrictions Weight Bearing Restrictions Per Provider Order: No     Mobility Bed Mobility Overal bed mobility: Needs Assistance Bed Mobility: Supine to Sit, Sit to Supine     Supine to sit: Mod assist, Used rails, HOB elevated Sit to supine: Min assist, Used rails   General bed mobility comments: Increased time to complete. Multimodel cueing provided to initiate and sequence.    Transfers Overall transfer level: Needs assistance Equipment used: 1 person hand held assist Transfers: Sit to/from Stand Sit to Stand: Mod assist, From elevated surface     General transfer comment: Bed pad used to lateral scoot towards HOB while seated EOB.      Balance Overall balance assessment: Needs assistance Sitting-balance support: Bilateral upper extremity supported, Feet supported Sitting balance-Leahy Scale: Fair       ADL either performed or assessed with clinical judgement   ADL       Grooming: Wash/dry face;Wash/dry hands;Set up;Bed level   Upper Body Bathing: Set up;Bed level   Lower Body Bathing: Maximal assistance;Sitting/lateral leans   Upper Body Dressing : Sitting;Minimal assistance   Lower Body Dressing: Maximal assistance;Sitting/lateral leans   Toilet Transfer: Maximal assistance;Stand-pivot;BSC/3in1   Toileting- Clothing Manipulation and Hygiene: Maximal assistance;Sitting/lateral lean               Vision Baseline Vision/History: 0 No visual deficits Ability to See in Adequate Light: 0 Adequate Patient Visual Report: No change from baseline Vision Assessment?: No apparent visual deficits     Perception Perception: Not tested       Praxis Praxis: Not tested       Pertinent Vitals/Pain Pain Assessment Pain Assessment: 0-10 Pain Score: 7  Pain Location: my right side Unable to pinpoint where pt was having pain. Pain Descriptors / Indicators: Grimacing, Discomfort Pain Intervention(s): Limited activity within patient's tolerance, Monitored during session, Repositioned, Patient requesting pain meds-RN notified, RN gave pain meds during session     Extremity/Trunk Assessment Upper Extremity Assessment Upper Extremity Assessment: Generalized weakness   Lower Extremity Assessment Lower Extremity Assessment: Defer to PT evaluation   Cervical / Trunk Assessment Cervical / Trunk Assessment: Normal   Communication Communication Communication: No apparent difficulties   Cognition Arousal: Alert Behavior During  Therapy: Flat affect Cognition: History of cognitive impairments    Following commands:  Impaired Following commands impaired: Follows one step commands inconsistently, Follows one step commands with increased time     Cueing  General Comments   Cueing Techniques: Verbal cues;Gestural cues;Tactile cues      Exercises General Exercises - Lower Extremity Ankle Circles/Pumps: AROM, Both, 10 reps, Supine Heel Slides: AROM, Both, 10 reps, Supine Hip ABduction/ADduction: AROM, Both, 10 reps, Supine        Home Living Family/patient expects to be discharged to:: Private residence Living Arrangements: Alone Available Help at Discharge: Personal care attendant Type of Home: House Home Access: Stairs to enter [employer] of Steps: 1 Entrance Stairs-Rails: Left Home Layout: Two level;1/2 bath on main level;Able to live on main level with bedroom/bathroom     Bathroom Shower/Tub: Tub/shower unit   Bathroom Toilet: Standard     Home Equipment: Grab bars - tub/shower;Rolling Walker (2 wheels);Cane - single point;Rollator (4 wheels)          Prior Functioning/Environment Prior Level of Function : Needs assist  Cognitive Assist : ADLs (cognitive)           Mobility Comments: using RW and requiring Min A as of 05/2024 (per last PT session). Prior to that in 04/2024, pt was ambulating without an AD ADLs Comments: prior to previous admission, indep with ADLs/IADLs, was working as a agricultural consultant for the [employer]; since prior admission and d/c from SNF, pt has had assist from paid caregiver with functional cognition, IADLs, and showering    OT Problem List: Decreased strength;Decreased activity tolerance;Impaired balance (sitting and/or standing)   OT Treatment/Interventions: Self-care/ADL training;Therapeutic exercise;Therapeutic activities;Energy conservation;DME and/or AE instruction;Patient/family education;Balance training;Manual therapy      OT Goals(Current goals can be found in the care plan section)   Acute Rehab OT  Goals Patient Stated Goal: to talk to her caregiver, April OT Goal Formulation: With patient Time For Goal Achievement: 08/12/24 Potential to Achieve Goals: Fair   OT Frequency:  Min 2X/week       AM-PAC OT 6 Clicks Daily Activity     Outcome Measure Help from another person eating meals?: A Little Help from another person taking care of personal grooming?: A Little Help from another person toileting, which includes using toliet, bedpan, or urinal?: Total Help from another person bathing (including washing, rinsing, drying)?: A Lot Help from another person to put on and taking off regular upper body clothing?: A Little Help from another person to put on and taking off regular lower body clothing?: Total 6 Click Score: 13   End of Session Nurse Communication: Patient requests pain meds  Activity Tolerance: Patient tolerated treatment well Patient left: in bed;with call bell/phone within reach;with bed alarm set;with family/visitor present  OT Visit Diagnosis: Muscle weakness (generalized) (M62.81)                Time: 8991-8966 OT Time Calculation (min): 25 min Charges:  OT General Charges $OT Visit: 1 Visit OT Evaluation $OT Eval Low Complexity: 1 Low OT Treatments $Self Care/Home Management : 8-22 mins  Leita Howell, OTR/L,CBIS  Supplemental OT - [facility] and [facility] Secure Chat Preferred    Lunden Mcleish, Leita BIRCH 07/29/2024, 1:39 PM

## 2024-07-30 DIAGNOSIS — Z515 Encounter for palliative care: Secondary | ICD-10-CM | POA: Diagnosis not present

## 2024-07-30 DIAGNOSIS — N3080 Other cystitis without hematuria: Secondary | ICD-10-CM | POA: Diagnosis not present

## 2024-07-30 DIAGNOSIS — C786 Secondary malignant neoplasm of retroperitoneum and peritoneum: Secondary | ICD-10-CM | POA: Diagnosis not present

## 2024-07-30 DIAGNOSIS — C569 Malignant neoplasm of unspecified ovary: Secondary | ICD-10-CM | POA: Diagnosis not present

## 2024-07-30 DIAGNOSIS — R180 Malignant ascites: Secondary | ICD-10-CM | POA: Diagnosis not present

## 2024-07-30 LAB — BASIC METABOLIC PANEL WITH GFR
Anion gap: 9 (ref 5–15)
BUN: 20 mg/dL (ref 8–23)
CO2: 26 mmol/L (ref 22–32)
Calcium: 8.8 mg/dL — ABNORMAL LOW (ref 8.9–10.3)
Chloride: 100 mmol/L (ref 98–111)
Creatinine, Ser: 0.58 mg/dL (ref 0.44–1.00)
GFR, Estimated: >60 mL/min
Glucose, Bld: 95 mg/dL (ref 70–99)
Potassium: 4.1 mmol/L (ref 3.5–5.1)
Sodium: 135 mmol/L (ref 135–145)

## 2024-07-30 LAB — PHOSPHORUS: Phosphorus: 2 mg/dL — ABNORMAL LOW (ref 2.5–4.6)

## 2024-07-30 LAB — MAGNESIUM: Magnesium: 2.1 mg/dL (ref 1.7–2.4)

## 2024-07-30 MED ORDER — POTASSIUM PHOSPHATES 15 MMOLE/5ML IV SOLN
30.0000 mmol | Freq: Once | INTRAVENOUS | Status: AC
  Administered 2024-07-30: 30 mmol via INTRAVENOUS
  Filled 2024-07-30: qty 10

## 2024-07-30 MED ORDER — ALBUTEROL SULFATE (2.5 MG/3ML) 0.083% IN NEBU
2.5000 mg | INHALATION_SOLUTION | RESPIRATORY_TRACT | Status: DC | PRN
  Administered 2024-07-30 – 2024-08-06 (×3): 2.5 mg via RESPIRATORY_TRACT
  Filled 2024-07-30 (×3): qty 3

## 2024-07-30 NOTE — Progress Notes (Signed)
 Michele Tyler   DOB:11-Aug-1945   FM#:[id number]    ASSESSMENT & PLAN:  Ovarian cancer She had received cycle 1 of treatment, complicated by admission, poor performance status, malnutrition and possible infection Overall improving since admission Continue supportive care She desire more chemotherapy She has appointment scheduled in the outpatient clinic and we will resume if her performance status improves  Acquired pancytopenia Due to recent treatment She does not need transfusion or G-CSF support right now Continue to monitor closely  Bilateral pleural effusion She is not symptomatic She does not need thoracentesis right now  Malignant ascites She is not symptomatic She does not need paracentesis right now  Severe chronic constipation She has fecal impaction, status post disimpaction by nursing staff Continue laxatives  Possible early sepsis Cultures negative to date  Continue IV antibiotics for total 7 days  Discharge planning We have extensive discussions about plan of care after discharge from the hospital She is currently requiring 24-hour nursing care and caregivers are not able to provide that Recommend consideration for skilled nursing facility placement  Almarie Bedford, MD 07/30/2024 6:59 PM  Subjective:  The patient appears alert and oriented and appears comfortable.  Caregivers by the bedside.  She is eating better since her last visit several days ago She denies constipation She has been afebrile despite worsening pancytopenia  Objective:  Vitals:   07/30/24 1335 07/30/24 1413  BP:  125/67  Pulse:  (!) 107  Resp:  18  Temp:  98.6 F (37 C)  SpO2: 95% 95%     Intake/Output Summary (Last 24 hours) at 07/30/2024 1859 Last data filed at 07/30/2024 1800 Gross per 24 hour  Intake 1142.24 ml  Output 1100 ml  Net 42.24 ml

## 2024-07-30 NOTE — Progress Notes (Signed)
 RT called for PRN breathing treatment. C/L bilateral diffused exp wheezing

## 2024-07-30 NOTE — Progress Notes (Signed)
 " Daily Progress Note   Patient Name: Michele Tyler       Date: 07/30/2024 DOB: 05-21-1946  Age: 79 y.o. MRN#: [id number] Attending Physician: Jadine Toribio SQUIBB, MD Primary Care Physician: [name], MD Admit Date: 07/26/2024 Length of Stay: 4 days  Reason for Consultation/Follow-up: Establishing goals of care  Subjective:   Reviewed EMR including recent documentation from hospitalist and SLP.  Discussed care with hospitalist, oncologist, and Gyn Onc today to coordinate care.  Oncologist planning to follow-up with patient today to discuss possible pathways for care moving forward. Discussed with RN for medical updates.  Patient was noted to have episode of wheezing and coughing this morning so SLP was ordered for aspiration evaluation.  SLP did not note any aspiration limitations and patient able to continue with regular diet and thin liquid.  RN did note patient had difficulty taking pills today so now requiring applesauce to swallow.  Presented to bedside in afternoon to see patient.  Patient laying comfortably in bed.  Patient awake and interactive though still confused at times.  Patient's friend, [name], present at bedside.  [name] noted that Dr. Lonn has not yet been by.  Noted awaiting her recommendations to determine if patient is appropriate for further cancer directed therapies or not.  Noted based on what she recommends, can further discuss pathways for medical care.  Did note palliative medicine team will continue to follow along with patient's journey and be present over the weekend to continue conversations.  All questions answered at that time.  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  Imaging upon admission showed severe emphysematous cystitis consistent with severe infection and high risk for sepsis.  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  Oncology and PCCM consulted for recommendations.  Palliative medicine team consulted to assist with complex medical decision making.   Recommendations/Plan: # Complex medical decision making/goals of care:       - Discussed care at patient's bedside with patient's friend, [name], present as detailed above in HPI.  Patient does not have prior HCPOA documentation though has discussed naming friend, [name], as HCPOA.  If patient's mentation improves to the point where it is deemed she has capacity to make this decision, would recommend completion of ACP documentation naming HCPOA.  Currently based on [place] guidelines, patient falls into the category of having individual with established relationship with patient who is acting in good faith and can reliably convey patient's wishes which would include [name], and [name].  Patient does have a paid caregiver, April, though expressed with patient paying April for her care, could create conflict of interest since a  paid caregiver would benefit from patient living as long as possible even if that would not align with patient's wishes for medical care.  - Already consulted TOC to confirm patient has no further NOK.  [name] stating patient is divorced, does not have any children, does not have any siblings, and her parents are no longer alive. At this time continuing appropriate medical interventions.  Dr. Lonn planning to follow-up today to discuss and help in determining if patient is appropriate for further cancer directed therapies as this would influence pathways for medical care moving forward such as aggressive medical pathway continuing PT/OT and considering rehab for outpatient cancer directed therapy versus needing to focus on patient's comfort with hospice support.  Palliative medicine team continuing to follow with patient's medical journey.                -  Code Status: Full Code  See: [place]  General Statutes Chapter 90. Medicine and Allied Occupations  90-21.13. Informed consent to health care treatment or procedure. In brief summary, this statute outlines that the following persons, in order indicated, are authorized to consent to medical treatment on behalf of the patient who does not demonstrate capacity to do so: Legally assigned guardian appointed by the court > healthcare agent appointed by legal healthcare power of attorney > healthcare agent appointed by the patient > legal spouse > majority of the patient's reasonably available parents and children who are at least 14 years of age > majority of reasonably available siblings over 63 ears of age > individual who has an established relationship with the patient, who is acting in good faith on behalf of the patient, and who can reliably convey the patient's wishes > attending physician with confirmation by another physician of the patient's condition and the necessity for treatment (unless in urgent/emergent situation).     # Discharge Planning: To Be Determined  Discussed with: Patient, patient's friend [name] at bedside, hospitalist, bedside RN, onc, gyn onc  Thank you for allowing the palliative care team to participate in the care  [name].  Tinnie Radar, DO Palliative Care Provider PMT # [phone]  If patient remains symptomatic despite maximum doses, please call PMT at [phone] between 0700 and 1900. Outside of these hours, please call attending, as PMT does not have night coverage. "

## 2024-07-30 NOTE — Evaluation (Signed)
 Clinical/Bedside Swallow Evaluation Patient Details  Name: Michele Tyler MRN: [id number] Date of Birth: 1945-10-12  Today's Date: 07/30/2024 Time: SLP Start Time (ACUTE ONLY): 1319 SLP Stop Time (ACUTE ONLY): 1328 SLP Time Calculation (min) (ACUTE ONLY): 9 min  Past Medical History:  Past Medical History:  Diagnosis Date   Anxiety    Depression    Hypertension    Malignant ascites (HCC) 07/28/2024   Stroke (cerebrum) [facility]    Past Surgical History:  Past Surgical History:  Procedure Laterality Date   DILATION AND CURETTAGE OF UTERUS  2002   FACELIFT  2005   IR IMAGING GUIDED PORT INSERTION  07/13/2024   IR PARACENTESIS  06/23/2024   TRANSESOPHAGEAL ECHOCARDIOGRAM (CATH LAB) N/A 06/16/2024   Procedure: TRANSESOPHAGEAL ECHOCARDIOGRAM;  Surgeon: Delford Maude BROCKS, MD;  Location: [facility] INVASIVE CV LAB;  Service: Cardiovascular;  Laterality: N/A;   HPI:  Michele Tyler is a 79 year old woman who presented 12/29 with weakness, diarrhea, abdominal pain. CT abdomen 12/29: Moderate bilateral pleural effusions were again identified with compressive atelectasis of the lung bases bilaterally. Moderate hiatal hernia. CT showed severe diffuse emphysematous cystitis consistent with severe infection high risk for sepsis. Pt with PMH newly diagnosed ovarian cancer with metastatic disease, status post 1 round chemotherapy 12/22.    Assessment / Plan / Recommendation  Clinical Impression  Pt presents with what appears to be a primary esophageal dysphagia.  Pt tolerated all consistencies trialed with no clinical s/s of aspiration, including straw sips of thin liquid.  Pt exhibited adequate oral clearance of solids.  She complains of stasis with food and pills.  Liquid wash does help to clear.  She denies hx GERD/heartburn.  She is having some wheezing.  She reports this is consistent and has not changed with PO intake. She has not been regurgitating.  Reviewed esophageal swallow precautions.  Consider  esophagram if symptoms persist. Pt has no further ST needs.  SLP will sign off.  Please reconsult if there is a change in swallow function.  Recommend continuing regular texture diet with thin liquids.   SLP Visit Diagnosis: Dysphagia, pharyngoesophageal phase (R13.14)    Aspiration Risk  No limitations    Diet Recommendation Regular;Thin liquid    Liquid Administration via: Straw;Cup Medication Administration: Whole meds with puree Compensations: Slow rate;Small sips/bites Postural Changes: Seated upright at 90 degrees;Remain upright for at least 30 minutes after po intake    Other Recommendations Recommended Consults: Consider esophageal assessment Oral Care Recommendations: Oral care BID     Swallow Evaluation Recommendations  N/A   Assistance Recommended at Discharge  N/A  Functional Status Assessment Patient has not had a recent decline in their functional status  Frequency and Duration  (N/A)          Prognosis Prognosis for improved oropharyngeal function:  (N/A)      Swallow Study   General Date of Onset: 07/26/25 HPI: Michele Tyler is a 79 year old woman who presented 12/29 with weakness, diarrhea, abdominal pain. CT abdomen 12/29: Moderate bilateral pleural effusions were again identified with compressive atelectasis of the lung bases bilaterally. Moderate hiatal hernia. CT showed severe diffuse emphysematous cystitis consistent with severe infection high risk for sepsis. Pt with PMH newly diagnosed ovarian cancer with metastatic disease, status post 1 round chemotherapy 12/22. Type of Study: Bedside Swallow Evaluation Previous Swallow Assessment: none, but has been seen for cognitive assessment during prior admissions in Oct and Nov with mild impairments Diet Prior to this Study: Regular;Thin liquids (Level 0) Temperature Spikes  Noted: No History of Recent Intubation: No Behavior/Cognition: Alert;Cooperative;Pleasant mood Oral Cavity Assessment: Within Functional  Limits Oral Care Completed by SLP: No Oral Cavity - Dentition: Adequate natural dentition Vision: Functional for self-feeding Self-Feeding Abilities: Able to feed self Patient Positioning: Upright in bed Baseline Vocal Quality: Normal (wheezing noted) Volitional Cough: Weak Volitional Swallow: Able to elicit    Oral/Motor/Sensory Function Overall Oral Motor/Sensory Function: Within functional limits Facial ROM: Within Functional Limits Facial Symmetry: Within Functional Limits Lingual ROM: Within Functional Limits Lingual Symmetry: Within Functional Limits Lingual Strength: Within Functional Limits Velum: Within Functional Limits Mandible: Within Functional Limits   Ice Chips Ice chips: Not tested   Thin Liquid Thin Liquid: Within functional limits Presentation: Straw    Nectar Thick Nectar Thick Liquid: Not tested   Honey Thick Honey Thick Liquid: Not tested   Puree Puree: Within functional limits Presentation: Spoon   Solid     Solid: Within functional limits Presentation:  (SLP fed)      Anette FORBES Grippe, MA, CCC-SLP Acute Rehabilitation Services Office: [phone] 07/30/2024,1:44 PM

## 2024-07-30 NOTE — Progress Notes (Signed)
" °  Progress Note   Patient: Michele Tyler FMW:[id number] DOB: 1946/05/28 DOA: 07/26/2024     4 DOS: the patient was seen and examined on 07/30/2024   Brief hospital course: 79 year old woman PMH newly diagnosed ovarian cancer with metastatic disease, status post 1 round chemotherapy 12/22, presented with weakness, diarrhea, abdominal pain.  CT showed severe diffuse emphysematous cystitis consistent with severe infection high risk for sepsis.  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  Continue empiric antibiotics.  Urine culture unrevealing.  Blood cultures remain no growth today. 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 Lonn, Dr. Viktoria.  Status post 1 cycle of treatment complicated by poor performance status, malnutrition. Urology consulted by EDP recommended Foley catheter, discussed with Dr. Devere 12/31 who advised no acute intervention required given normal creatinine and ongoing urine output. Effusion and ascites asymptomatic.  No need for centesis now. Unclear what current treatment plan is. Oncology plans to have further discussions with patient as an outpatient.     Acquired pancytopenia Secondary to chemotherapy.  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  Not presently able to make decisions about her own healthcare.   Essential hypertension Continue Norvasc , metoprolol    Possible colitis Chronic constipation Fecal impaction Continue bowel  regimen Disimpact as needed      Subjective:  Feels okay today  Physical Exam: Vitals:   07/29/24 0526 07/29/24 1331 07/29/24 2116 07/30/24 0550  BP: (!) 143/88 139/75 (!) 164/91 (!) 157/97  Pulse: 92 92 (!) 106 100  Resp: 17 18 15 16   Temp: 97.9 F (36.6 C) 97.8 F (36.6 C) 98.1 F (36.7 C) 97.8 F (36.6 C)  TempSrc: Oral Oral Oral Oral  SpO2: 93% 94% 94% 93%  Weight:      Height:       Physical Exam Vitals and nursing note reviewed.  Constitutional:      General: She is not in acute distress.    Appearance: She is ill-appearing (Chronically). She is not toxic-appearing.  Cardiovascular:     Rate and Rhythm: Normal rate and regular rhythm.     Heart sounds: No murmur heard. Pulmonary:     Effort: Pulmonary effort is normal. No respiratory distress.     Breath sounds: No wheezing, rhonchi or rales.  Abdominal:     Palpations: Abdomen is soft.  Neurological:     Mental Status: She is alert.  Psychiatric:        Mood and Affect: Mood normal.        Behavior: Behavior normal.     Data Reviewed: Potassium 4.1 Phosphorus low 2.0 Magnesium within normal limits Urine output 1150  Family Communication: Alyse Kay at bedside  Disposition: Status is: Inpatient Remains inpatient appropriate because: cystitsi     Time spent: 20 minutes  Author: Toribio Door, MD 07/30/2024 8:03 AM  For on call review [email or web address].    "

## 2024-07-30 NOTE — Plan of Care (Signed)
  Problem: Pain Managment: Goal: General experience of comfort will improve and/or be controlled Outcome: Progressing

## 2024-07-30 NOTE — Progress Notes (Signed)
 Physical Therapy Treatment Patient Details Name: Michele Tyler MRN: [id number] DOB: 1945/10/06 Today's Date: 07/30/2024   History of Present Illness 79 yo female admitted with severe emphysematous cystitis. Hx of CVA 04/2024, ovarian cancer with mets, depression, anxiety, constipation    PT Comments   Pt admitted with above diagnosis.  Pt currently with functional limitations due to the deficits listed below (see PT Problem List). Pt in bed when PT returned later in the day. Visitors present and pt agreeable to getting OOB. Pt requires increased time for all motor processing and planing with attention to task and multimodal cues. Pt required mod A for supine to sit with use of hospital bed features, min A for sit to stand  from EOB and CGA for subsequent sit to stand from recliner, static standing B UE support at RW and no overt LOB, pt  able to progress with gait tasks however limited to 12 feet in room with RW and CGA to min A due to bowel incontinent episode. Pt required A for hygiene tasks with nurse and PT. Pt left seated in recliner, all needs in place and visitor present. Patient will benefit from continued inpatient follow up therapy, <3 hours/day. No reports of SOB, no apparent wheezing, dizziness, with pt endorsing R flank pain.   Pt will benefit from acute skilled PT to increase their independence and safety with mobility to allow discharge.      If plan is discharge home, recommend the following: A lot of help with walking and/or transfers;A lot of help with bathing/dressing/bathroom;Assistance with cooking/housework;Assist for transportation;Help with stairs or ramp for entrance   Can travel by private vehicle        Equipment Recommendations  None recommended by PT    Recommendations for Other Services       Precautions / Restrictions Precautions Precautions: Fall Recall of Precautions/Restrictions: Impaired Restrictions Weight Bearing Restrictions Per Provider Order: No      Mobility  Bed Mobility Overal bed mobility: Needs Assistance Bed Mobility: Supine to Sit     Supine to sit: Mod assist, Used rails, HOB elevated     General bed mobility comments: Increased time to complete. Multimodel cueing provided to initiate, attend to task and sequence.    Transfers Overall transfer level: Needs assistance Equipment used: Rolling walker (2 wheels) Transfers: Sit to/from Stand Sit to Stand: From elevated surface, Min assist           General transfer comment: min cues, increased time    Ambulation/Gait Ambulation/Gait assistance: Contact guard assist, Min assist Gait Distance (Feet): 12 Feet Assistive device: Rolling walker (2 wheels) Gait Pattern/deviations: Step-to pattern, Trunk flexed Gait velocity: decreased     General Gait Details: limited foot clearance, narrow BOS and flexed posture, cues and min A for Rw management for turn, bowel incontince episode during gait tasks   Stairs             Wheelchair Mobility     Tilt Bed    Modified Rankin (Stroke Patients Only)       Balance Overall balance assessment: Needs assistance Sitting-balance support: Bilateral upper extremity supported, Feet supported Sitting balance-Leahy Scale: Fair     Standing balance support: Bilateral upper extremity supported, During functional activity, Reliant on assistive device for balance Standing balance-Leahy Scale: Poor                              Communication Communication Communication: No apparent  difficulties  Cognition Arousal: Alert Behavior During Therapy: Flat affect   PT - Cognitive impairments: History of cognitive impairments, Safety/Judgement, Problem solving, Initiation, Sequencing, Orientation                         Following commands: Impaired Following commands impaired: Follows one step commands inconsistently, Follows one step commands with increased time (difficulty with motor processing  and planning)    Cueing Cueing Techniques: Verbal cues, Gestural cues, Tactile cues, Visual cues  Exercises      General Comments        Pertinent Vitals/Pain Pain Assessment Pain Assessment: Faces Faces Pain Scale: Hurts little more Pain Location: R flank Pain Descriptors / Indicators: Grimacing, Discomfort, Restless Pain Intervention(s): Limited activity within patient's tolerance, Monitored during session, Repositioned    Home Living                          Prior Function            PT Goals (current goals can now be found in the care plan section) Acute Rehab PT Goals Patient Stated Goal: none stated PT Goal Formulation: Patient unable to participate in goal setting Time For Goal Achievement: 08/11/24 Potential to Achieve Goals: Good Progress towards PT goals: Progressing toward goals    Frequency    Min 3X/week      PT Plan      Co-evaluation              AM-PAC PT 6 Clicks Mobility   Outcome Measure  Help needed turning from your back to your side while in a flat bed without using bedrails?: A Little Help needed moving from lying on your back to sitting on the side of a flat bed without using bedrails?: A Lot Help needed moving to and from a bed to a chair (including a wheelchair)?: A Lot Help needed standing up from a chair using your arms (e.g., wheelchair or bedside chair)?: A Little Help needed to walk in hospital room?: A Little Help needed climbing 3-5 steps with a railing? : A Lot 6 Click Score: 15    End of Session Equipment Utilized During Treatment: Gait belt Activity Tolerance: Patient tolerated treatment well Patient left: with call bell/phone within reach;in chair;with chair alarm set;with family/visitor present (on BSC with NT) Nurse Communication: Mobility status PT Visit Diagnosis: Muscle weakness (generalized) (M62.81);Difficulty in walking, not elsewhere classified (R26.2);Unsteadiness on feet (R26.81)      Time: 8361-8288 PT Time Calculation (min) (ACUTE ONLY): 33 min  Charges:    $Gait Training: 8-22 mins $Therapeutic Activity: 8-22 mins PT General Charges $$ ACUTE PT VISIT: 1 Visit                     [place], PT Acute Rehab    [place] VEAR Drone 07/30/2024, 6:27 PM

## 2024-07-30 NOTE — Plan of Care (Signed)

## 2024-07-30 NOTE — Progress Notes (Signed)
 PT Note  Patient Details Name: Michele Tyler MRN: [id number] DOB: Dec 12, 1945   Cancelled Treatment:    Reason Eval/Treat Not Completed: Other (comment). Another care provider present at 1322. Nurse reports confusion, wheezing with CXR ordered. PT to return as schedule allows.   [place], PT Acute Rehab   [place] VEAR Drone 07/30/2024, 2:52 PM

## 2024-07-31 DIAGNOSIS — R4589 Other symptoms and signs involving emotional state: Secondary | ICD-10-CM | POA: Diagnosis not present

## 2024-07-31 DIAGNOSIS — N3080 Other cystitis without hematuria: Secondary | ICD-10-CM | POA: Diagnosis not present

## 2024-07-31 DIAGNOSIS — Z515 Encounter for palliative care: Secondary | ICD-10-CM | POA: Diagnosis not present

## 2024-07-31 DIAGNOSIS — C569 Malignant neoplasm of unspecified ovary: Secondary | ICD-10-CM | POA: Diagnosis not present

## 2024-07-31 DIAGNOSIS — N1330 Unspecified hydronephrosis: Secondary | ICD-10-CM | POA: Diagnosis not present

## 2024-07-31 LAB — BASIC METABOLIC PANEL WITH GFR
Anion gap: 10 (ref 5–15)
BUN: 18 mg/dL (ref 8–23)
CO2: 26 mmol/L (ref 22–32)
Calcium: 9.1 mg/dL (ref 8.9–10.3)
Chloride: 100 mmol/L (ref 98–111)
Creatinine, Ser: 0.52 mg/dL (ref 0.44–1.00)
GFR, Estimated: >60 mL/min
Glucose, Bld: 102 mg/dL — ABNORMAL HIGH (ref 70–99)
Potassium: 4.5 mmol/L (ref 3.5–5.1)
Sodium: 136 mmol/L (ref 135–145)

## 2024-07-31 LAB — CBC
HCT: 32.3 % — ABNORMAL LOW (ref 36.0–46.0)
Hemoglobin: 10.6 g/dL — ABNORMAL LOW (ref 12.0–15.0)
MCH: 26.9 pg (ref 26.0–34.0)
MCHC: 32.8 g/dL (ref 30.0–36.0)
MCV: 82 fL (ref 80.0–100.0)
Platelets: 398 K/uL (ref 150–400)
RBC: 3.94 MIL/uL (ref 3.87–5.11)
RDW: 13.9 % (ref 11.5–15.5)
WBC: 1.3 K/uL — CL (ref 4.0–10.5)
nRBC: 0 % (ref 0.0–0.2)

## 2024-07-31 LAB — MAGNESIUM: Magnesium: 2.1 mg/dL (ref 1.7–2.4)

## 2024-07-31 LAB — PHOSPHORUS: Phosphorus: 2.9 mg/dL (ref 2.5–4.6)

## 2024-07-31 NOTE — Progress Notes (Signed)
" °  Progress Note   Patient: Michele Tyler FMW:[id number] DOB: 1945-10-15 DOA: 07/26/2024     5 DOS: the patient was seen and examined on 07/31/2024   Brief hospital course: 79 year old woman PMH newly diagnosed ovarian cancer with metastatic disease, status post 1 round chemotherapy 12/22, presented with weakness, diarrhea, abdominal pain.  CT showed severe diffuse emphysematous cystitis consistent with severe infection high risk for sepsis.  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  Blood cultures remain no growth today. Hemodynamics stable. Completes empiric antibiotics today.   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 Lonn, Dr. Viktoria.  Status post 1 cycle of treatment complicated by poor performance status, malnutrition. Urology consulted by EDP recommended Foley catheter, discussed with Dr. Devere 12/31 who advised no acute intervention required given normal creatinine and ongoing urine output. Effusion and ascites asymptomatic.  No need for centesis now. Unclear what current treatment plan is. Oncology plans to have further discussions with patient as an outpatient.     Acquired pancytopenia Secondary to chemotherapy.  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  Not presently able to make decisions about her own healthcare.   Essential hypertension Stable.  Continue Norvasc , metoprolol    Possible  colitis Chronic constipation Fecal impaction Continue bowel regimen Disimpact as needed    Subjective:  Some abdominal pain  Physical Exam: Vitals:   07/30/24 1335 07/30/24 1413 07/30/24 2018 07/31/24 0603  BP:  125/67 134/82 (!) 147/76  Pulse:  (!) 107 97 81  Resp:  18 15 15   Temp:  98.6 F (37 C) (!) 97.5 F (36.4 C) 97.9 F (36.6 C)  TempSrc:   Oral Oral  SpO2: 95% 95% 93% 95%  Weight:      Height:       Physical Exam Vitals and nursing note reviewed.  Constitutional:      General: She is not in acute distress.    Appearance: She is not ill-appearing or toxic-appearing.  Cardiovascular:     Rate and Rhythm: Normal rate and regular rhythm.     Heart sounds: No murmur heard. Pulmonary:     Effort: Pulmonary effort is normal. No respiratory distress.     Breath sounds: No wheezing, rhonchi or rales.  Abdominal:     Palpations: Abdomen is soft.  Neurological:     Mental Status: She is alert.  Psychiatric:        Mood and Affect: Mood normal.        Behavior: Behavior normal.     Data Reviewed: Basic metabolic panel, phosphorus and magnesium unremarkable WBC down to 1.3 Hemoglobin stable 10.6  Family Communication: friend [name] by telephone  Disposition: Status is: Inpatient Remains inpatient appropriate because: Plan SNF     Time spent: 20 minutes  Author: Toribio Door, MD 07/31/2024 8:30 AM  For on call review [email or web address].    "

## 2024-07-31 NOTE — Progress Notes (Signed)
 " Daily Progress Note   Patient Name: Michele Tyler       Date: 07/31/2024 DOB: May 23, 1946  Age: 79 y.o. MRN#: [id number] Attending Physician: Michele Toribio SQUIBB, MD Primary Care Physician: Michele Toribio POUR, MD Admit Date: 07/26/2024 Length of Stay: 5 days  Reason for Consultation/Follow-up: Establishing goals of care  Subjective:   Reviewed EMR including recent documentation from hospitalist and oncologist.  Oncologist discussed pathways for care moving forward and that palliation is a candidate for further cancer directed therapies which patient stated she would want to pursue.  Reviewed recent CBC noting WBC low at 1.3. Discussed care with bedside RN for medical updates.  Presented to bedside to see patient.  No visitors present at bedside.  Patient laying comfortably in bed.  Patient noting that she is having some pain in her right side today though otherwise feels she is doing well.  She is asking for as needed pain medications and noted would inform RN. Inquired about discussion with oncologist on 07/30/2024.  Patient did not remember conversation with oncologist or plans for cancer directed therapy moving forward.  Did discuss pathways since patient is being offered cancer directed therapies.  Patient noted that she would want to pursue cancer directed therapies if offered.  Patient noted that she is not yet ready to transition to comfort and hospice.  Patient would be willing to work with PT/OT and go to rehab to regain her strength for cancer directed therapies.  Acknowledged this. Also inquired again about ACP documentation and who patient would want to be her HCPOA.  Patient stated again she would want her friend, Michele Tyler, to be her HCPOA.  Patient has remained consistent in answering this and is able to answer questions regarding it.  Have engaged chaplain regarding ACP documentation since patient has had consistency regarding this decision. All questions answered at that time.   Noted palliative medicine team will continue to follow with patient's medical journey.  Requested to call patient's friend, Michele Tyler.  Spoke with Michele who noted she was present for conversation with oncologist.  She is agreeing with supporting patient wanting to get further cancer directed therapies.  Noted TOC would assist in coordination of rehab/discharge planning. Michele Tyler requested call from Michele Tyler regarding information for this. Michele also requested that patient never have home health through [facility] as she feels this group did not provide good services for the patient.  Encouraged continued discussions with TOC regarding planning. Michele also noted that Michele is working with a facilities manager.  Patient and Michele had gone to [employer] in outpatient setting so however planning to follow-up on this next week as able.  All questions answered at that time.  Noted palliative medicine team to continue to follow with patient's medical journey.  Discussed care with hospitalist, oncologist, TOC, and bedside RN after visit to coordinate care.  Objective:   Vital Signs:  BP (!) 147/76 (BP Location: Right Arm)   Pulse 81   Temp 97.9 F (36.6 C) (Oral)   Resp 15   Ht 5' 4 (1.626 m)   Wt 49 kg   SpO2 95%   BMI 18.54 kg/m   Physical Exam: General: NAD, awake, chronically ill-appearing, frail, cachectic Cardiovascular: RRR Respiratory: no increased work of breathing noted, not in respiratory distress Neuro: Awake, alert, consistently stating would want Michele Tyler up charts to be HCPOA (has done so in prior visits as well)  Assessment & Plan:   Assessment: Patient is a 79 year old woman  with a past medical history of newly diagnosed ovarian cancer that is at minimum stage III with involvement of peritoneum was admitted on 07/26/2024 status post round 1 of chemotherapy on 07/19/2024 for management of weakness, diarrhea, and abdominal pain.  Imaging upon admission  showed severe emphysematous cystitis consistent with severe infection and high risk for sepsis.  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  Oncology and PCCM consulted for recommendations.  Palliative medicine team consulted to assist with complex medical decision making.   Recommendations/Plan: # Complex medical decision making/goals of care:       - Discussed care at patient at bedside and patient's friend, Michele Tyler, over the phone as detailed above in HPI.  Continuing appropriate medical interventions at this time.  Oncology has discussed providing patient with further cancer directed therapies.  Patient noting she would want to pursue cancer directed therapies and would be willing to go to rehab to regain strength to get them.  TOC to assist with discharge coordination moving forward.  Palliative medicine team continuing to follow with patient's medical journey.  - Patient has continuously stated during multiple visits that she would want her friend, Michele Tyler, to be HCPOA.  Have involved chaplain to assist with completion of documentation for this.                -  Code Status: Full Code    # Discharge Planning: To Be Determined  Discussed with: Patient, patient's friend Michele over the phone, hospitalist, bedside RN, onc, spiritual care  Thank you for allowing the palliative care team to participate in the care Michele Tyler.  Michele Radar, DO Palliative Care Provider PMT # [phone]  If patient remains symptomatic despite maximum doses, please call PMT at [phone] between 0700 and 1900. Outside of these hours, please call attending, as PMT does not have night coverage.  Personally spent 35 minutes in patient care including extensive chart review (labs, imaging, progress/consult notes, vital signs), medically appropraite exam,  discussed with treatment team, education to patient, family, and staff, documenting clinical information, medication review and management, coordination of care, and available advanced directive documents.   "

## 2024-08-01 DIAGNOSIS — R180 Malignant ascites: Secondary | ICD-10-CM | POA: Diagnosis not present

## 2024-08-01 DIAGNOSIS — N1330 Unspecified hydronephrosis: Secondary | ICD-10-CM | POA: Diagnosis not present

## 2024-08-01 DIAGNOSIS — N3080 Other cystitis without hematuria: Secondary | ICD-10-CM | POA: Diagnosis not present

## 2024-08-01 DIAGNOSIS — R4589 Other symptoms and signs involving emotional state: Secondary | ICD-10-CM | POA: Diagnosis not present

## 2024-08-01 DIAGNOSIS — Z515 Encounter for palliative care: Secondary | ICD-10-CM | POA: Diagnosis not present

## 2024-08-01 DIAGNOSIS — C569 Malignant neoplasm of unspecified ovary: Secondary | ICD-10-CM | POA: Diagnosis not present

## 2024-08-01 LAB — CBC WITH DIFFERENTIAL/PLATELET
Abs Immature Granulocytes: 0.02 K/uL (ref 0.00–0.07)
Basophils Absolute: 0 K/uL (ref 0.0–0.1)
Basophils Relative: 3 %
Eosinophils Absolute: 0.1 K/uL (ref 0.0–0.5)
Eosinophils Relative: 5 %
HCT: 33 % — ABNORMAL LOW (ref 36.0–46.0)
Hemoglobin: 10.5 g/dL — ABNORMAL LOW (ref 12.0–15.0)
Immature Granulocytes: 1 %
Lymphocytes Relative: 25 %
Lymphs Abs: 0.4 K/uL — ABNORMAL LOW (ref 0.7–4.0)
MCH: 26.7 pg (ref 26.0–34.0)
MCHC: 31.8 g/dL (ref 30.0–36.0)
MCV: 84 fL (ref 80.0–100.0)
Monocytes Absolute: 0.6 K/uL (ref 0.1–1.0)
Monocytes Relative: 39 %
Neutro Abs: 0.4 K/uL — CL (ref 1.7–7.7)
Neutrophils Relative %: 27 %
Platelets: 402 K/uL — ABNORMAL HIGH (ref 150–400)
RBC: 3.93 MIL/uL (ref 3.87–5.11)
RDW: 14.2 % (ref 11.5–15.5)
WBC: 1.6 K/uL — ABNORMAL LOW (ref 4.0–10.5)
nRBC: 0 % (ref 0.0–0.2)

## 2024-08-01 LAB — CULTURE, BLOOD (ROUTINE X 2)
Culture: NO GROWTH
Culture: NO GROWTH
Special Requests: ADEQUATE
Special Requests: ADEQUATE

## 2024-08-01 LAB — BASIC METABOLIC PANEL WITH GFR
Anion gap: 9 (ref 5–15)
BUN: 20 mg/dL (ref 8–23)
CO2: 25 mmol/L (ref 22–32)
Calcium: 9.2 mg/dL (ref 8.9–10.3)
Chloride: 103 mmol/L (ref 98–111)
Creatinine, Ser: 0.53 mg/dL (ref 0.44–1.00)
GFR, Estimated: >60 mL/min
Glucose, Bld: 92 mg/dL (ref 70–99)
Potassium: 4.5 mmol/L (ref 3.5–5.1)
Sodium: 136 mmol/L (ref 135–145)

## 2024-08-01 LAB — PHOSPHORUS: Phosphorus: 3.1 mg/dL (ref 2.5–4.6)

## 2024-08-01 LAB — MAGNESIUM: Magnesium: 2.1 mg/dL (ref 1.7–2.4)

## 2024-08-01 NOTE — Progress Notes (Signed)
 Mobility Specialist - Progress Note   08/01/24 0924  Mobility  Activity Ambulated with assistance  Level of Assistance Minimal assist, patient does 75% or more  Assistive Device Front wheel walker  Distance Ambulated (ft) 10 ft  Range of Motion/Exercises Active  Activity Response Tolerated fair  Mobility visit 1 Mobility  Mobility Specialist Start Time (ACUTE ONLY) 0910  Mobility Specialist Stop Time (ACUTE ONLY) [id number]  Mobility Specialist Time Calculation (min) (ACUTE ONLY) 14 min   Pt was found in bed and agreeable to mobilize. Was a little unsteady. At EOS returned to recliner chair with all needs met. Call bell in reach and chair alarm on. RN notified.   Erminio Leos,  Mobility Specialist Can be reached via Secure Chat

## 2024-08-01 NOTE — Progress Notes (Signed)
" °  Progress Note   Patient: Michele Tyler FMW:[id number] DOB: 04/03/1946 DOA: 07/26/2024     6 DOS: the patient was seen and examined on 08/01/2024   Brief hospital course: 79 year old woman PMH newly diagnosed ovarian cancer with metastatic disease, status post 1 round chemotherapy 12/22, presented with weakness, diarrhea, abdominal pain.  CT showed severe diffuse emphysematous cystitis consistent with severe infection high risk for sepsis.  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  Blood cultures remain no growth/final. Hemodynamics stable. Completes empiric antibiotics today.   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 Lonn, Dr. Viktoria.  Status post 1 cycle of treatment complicated by poor performance status, malnutrition. Urology consulted by EDP recommended Foley catheter, discussed with Dr. Devere 12/31 who advised no acute intervention required given normal creatinine and ongoing urine output. Effusion and ascites asymptomatic.  No need for centesis now. Unclear what current treatment plan is. Oncology plans to have further discussions with patient as an outpatient.     Acquired pancytopenia Chemotherapy-induced neutropenia Secondary to chemotherapy.  Per oncology does not need transfusion or G-CSF  CBC with differential in a.m.   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     Essential hypertension Stable.  Continue Norvasc , metoprolol    Possible  colitis Chronic constipation Fecal impaction Continue bowel regimen Disimpact as needed      Subjective:  Feels ok today  Physical Exam: Vitals:   07/31/24 0603 07/31/24 1355 07/31/24 2134 08/01/24 0554  BP: (!) 147/76 127/72 132/74 (!) 153/82  Pulse: 81 95 87 97  Resp: 15 18 15 15   Temp: 97.9 F (36.6 C) 98.2 F (36.8 C) 98.5 F (36.9 C) 97.8 F (36.6 C)  TempSrc: Oral   Oral  SpO2: 95% 99% 93% 93%  Weight:      Height:       Physical Exam Vitals and nursing note reviewed.  Constitutional:      General: She is not in acute distress.    Appearance: She is not ill-appearing or toxic-appearing.  Cardiovascular:     Rate and Rhythm: Normal rate and regular rhythm.     Heart sounds: No murmur heard. Pulmonary:     Effort: Pulmonary effort is normal. No respiratory distress.     Breath sounds: No wheezing, rhonchi or rales.  Neurological:     Mental Status: She is alert.  Psychiatric:        Mood and Affect: Mood normal.        Behavior: Behavior normal.     Data Reviewed: Basic metabolic panel, phosphorus and magnesium within normal limits WBC up to 1.6, hemoglobin stable 10.5, ANC 400  Family Communication:   Disposition: Status is: Inpatient Remains inpatient appropriate because: Plan SNF     Time spent: 20 minutes  Author: Toribio Door, MD 08/01/2024 7:51 AM  For on call review [email or web address].    "

## 2024-08-01 NOTE — Plan of Care (Signed)
  Problem: Activity: Goal: Risk for activity intolerance will decrease Outcome: Progressing   Problem: Nutrition: Goal: Adequate nutrition will be maintained Outcome: Progressing   Problem: Coping: Goal: Level of anxiety will decrease Outcome: Progressing   Problem: Pain Managment: Goal: General experience of comfort will improve and/or be controlled Outcome: Progressing   Problem: Safety: Goal: Ability to remain free from injury will improve Outcome: Progressing

## 2024-08-01 NOTE — Plan of Care (Signed)
 ?  Problem: Clinical Measurements: ?Goal: Ability to maintain clinical measurements within normal limits will improve ?Outcome: Progressing ?Goal: Will remain free from infection ?Outcome: Progressing ?Goal: Diagnostic test results will improve ?Outcome: Progressing ?  ?

## 2024-08-01 NOTE — Progress Notes (Signed)
 Date and time results received: 08/01/2024  0636  Test: NEUT# Critical Value: 0.4  Name of Provider Notified: Lynwood Kipper, NP  Orders Received? Or Actions Taken?:  Send the message to NP. Waiting for Response .

## 2024-08-01 NOTE — Progress Notes (Signed)
 " Daily Progress Note   Patient Name: Michele Tyler       Date: 08/01/2024 DOB: 1946-03-25  Age: 79 y.o. MRN#: [id number] Attending Physician: Jadine Toribio SQUIBB, MD Primary Care Physician: [name], MD Admit Date: 07/26/2024 Length of Stay: 6 days  Reason for Consultation/Follow-up: Establishing goals of care  Subjective:   Reviewed EMR including recent documentation from hospitalist and mobility specialist.  Patient worked with mobility specialist today and was able to ambulate 10 feet though noted to be unsteady at times.  Reviewed recent CBC noting WBCs trending up to 1.6.  At time of EMR review in past 24 hours patient has received as needed oxycodone  10 mg x 2 doses. Discussed care with bedside RN for medical updates.  Presented to bedside to see patient.  Patient sitting comfortably in chair at bedside.  Patient's friend, Michele, present at bedside.  Able to follow-up on patient's symptom burden today.  Patient feels that her pain is currently managed with the as needed oxycodone .  Patient enthusiastic to regain strength so she can follow-up with oncology in the outpatient setting regarding further cancer directed therapies.  Michele Tyler noted TOC had been by and is looking further into rehab.  Patient was inquiring if her caregiver would be going to rehab with her.  Encouraged further discussions with TOC regarding this as unsure if that would be available. Michele also requesting pet therapy come by and visit with the patient.  Noted would inform RN as usually this is a dance movement psychotherapist and unsure if can be requested at specific times.  Did inquire again about ACP documentation.  Patient has consistently stated that she would want her friend, Michele Tyler, to be her HCPOA.  Noted would asked chaplain to come by tomorrow to assist with completion of this documentation.  All questions answered at that time.  Noted palliative medicine team to continue to follow patient's medical  journey.  Discussed care with hospitalist, TOC, and bedside RN to coordinate care.  Objective:   Vital Signs:  BP (!) 153/82 (BP Location: Left Arm)   Pulse 97   Temp 97.8 F (36.6 C) (Oral)   Resp 15   Ht 5' 4 (1.626 m)   Wt 49 kg   SpO2 93%   BMI 18.54 kg/m   Physical Exam: General: NAD, awake, chronically ill-appearing, frail, cachectic, sitting in chair at bedside Cardiovascular: RRR Respiratory: no increased work of breathing noted, not in respiratory distress Neuro: Awake, alert, consistently stating would want Michele up charts to be HCPOA (has done so in prior visits as well)  Assessment & Plan:   Assessment: Patient is a 79 year old woman with a past medical history of newly diagnosed ovarian cancer that is at minimum stage III with involvement of peritoneum was admitted on 07/26/2024 status post round 1 of chemotherapy on 07/19/2024 for management of weakness, diarrhea, and abdominal pain.  Imaging upon admission showed severe emphysematous cystitis consistent with severe infection and high risk for sepsis.  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  Oncology and PCCM consulted for recommendations.  Palliative medicine team consulted to assist with complex medical decision making.   Recommendations/Plan: # Complex medical decision making/goals of care:       - Discussed care at patient and patient's friend, Michele Tyler, as detailed above in HPI.  Continuing appropriate medical interventions at this time.  Oncology has discussed providing patient  with further cancer directed therapies.  Patient stating she would want to pursue cancer directed therapies and would be willing to go to rehab to regain strength to get them.  TOC to assist with discharge coordination moving forward.  Palliative medicine team continuing to follow with  patient's medical journey.  - Patient has continuously stated during multiple visits that she would want her friend, Michele Tyler, to be HCPOA.  Have involved chaplain to assist with completion of documentation for this.                -  Code Status: Full Code    # Discharge Planning: To Be Determined  Discussed with: Patient, patient's friend Michele, hospitalist, bedside RN, TOC  Thank you for allowing the palliative care team to participate in the care [name].  Tinnie Radar, DO Palliative Care Provider PMT # [phone]  If patient remains symptomatic despite maximum doses, please call PMT at [phone] between 0700 and 1900. Outside of these hours, please call attending, as PMT does not have night coverage.  Personally spent 35 minutes in patient care including extensive chart review (labs, imaging, progress/consult notes, vital signs), medically appropraite exam, discussed with treatment team, education to patient, family, and staff, documenting clinical information, medication review and management, coordination of care, and available advanced directive documents.    "

## 2024-08-02 ENCOUNTER — Inpatient Hospital Stay

## 2024-08-02 ENCOUNTER — Inpatient Hospital Stay (HOSPITAL_COMMUNITY)

## 2024-08-02 DIAGNOSIS — Z7189 Other specified counseling: Secondary | ICD-10-CM | POA: Diagnosis not present

## 2024-08-02 DIAGNOSIS — C569 Malignant neoplasm of unspecified ovary: Secondary | ICD-10-CM | POA: Diagnosis not present

## 2024-08-02 DIAGNOSIS — R5381 Other malaise: Secondary | ICD-10-CM

## 2024-08-02 DIAGNOSIS — N3080 Other cystitis without hematuria: Secondary | ICD-10-CM | POA: Diagnosis not present

## 2024-08-02 DIAGNOSIS — Z515 Encounter for palliative care: Secondary | ICD-10-CM | POA: Diagnosis not present

## 2024-08-02 LAB — CBC WITH DIFFERENTIAL/PLATELET
Abs Immature Granulocytes: 0.03 K/uL (ref 0.00–0.07)
Basophils Absolute: 0 K/uL (ref 0.0–0.1)
Basophils Relative: 2 %
Eosinophils Absolute: 0.1 K/uL (ref 0.0–0.5)
Eosinophils Relative: 4 %
HCT: 32.5 % — ABNORMAL LOW (ref 36.0–46.0)
Hemoglobin: 10.5 g/dL — ABNORMAL LOW (ref 12.0–15.0)
Immature Granulocytes: 2 %
Lymphocytes Relative: 24 %
Lymphs Abs: 0.4 K/uL — ABNORMAL LOW (ref 0.7–4.0)
MCH: 27 pg (ref 26.0–34.0)
MCHC: 32.3 g/dL (ref 30.0–36.0)
MCV: 83.5 fL (ref 80.0–100.0)
Monocytes Absolute: 0.7 K/uL (ref 0.1–1.0)
Monocytes Relative: 35 %
Neutro Abs: 0.6 K/uL — ABNORMAL LOW (ref 1.7–7.7)
Neutrophils Relative %: 33 %
Platelets: 440 K/uL — ABNORMAL HIGH (ref 150–400)
RBC: 3.89 MIL/uL (ref 3.87–5.11)
RDW: 14.6 % (ref 11.5–15.5)
WBC: 1.9 K/uL — ABNORMAL LOW (ref 4.0–10.5)
nRBC: 0 % (ref 0.0–0.2)

## 2024-08-02 MED ORDER — IOHEXOL 9 MG/ML PO SOLN
500.0000 mL | ORAL | Status: AC
  Administered 2024-08-02 (×2): 500 mL via ORAL

## 2024-08-02 MED ORDER — IOHEXOL 350 MG/ML SOLN
80.0000 mL | Freq: Once | INTRAVENOUS | Status: AC | PRN
  Administered 2024-08-02: 80 mL via INTRAVENOUS

## 2024-08-02 MED ORDER — SODIUM CHLORIDE 0.9% FLUSH: 10.0000 mL | INTRAVENOUS | Status: DC | PRN

## 2024-08-02 MED ORDER — SODIUM CHLORIDE 0.9 % IV SOLN
2.0000 g | INTRAVENOUS | Status: DC
  Administered 2024-08-02 – 2024-08-03 (×2): 2 g via INTRAVENOUS
  Filled 2024-08-02 (×2): qty 20

## 2024-08-02 MED ORDER — SODIUM CHLORIDE 0.9% FLUSH
10.0000 mL | Freq: Two times a day (BID) | INTRAVENOUS | Status: DC
  Administered 2024-08-02 – 2024-08-06 (×7): 10 mL

## 2024-08-02 NOTE — [place] FL2 (Signed)
 " [place]  MEDICAID FL2 LEVEL OF CARE FORM     IDENTIFICATION  Patient Name: Michele Tyler Birthdate: 06-10-1946 Sex: female Admission Date (Current Location): 07/26/2024  [facility] and [place] Number:  Producer, Television/film/video and Address:  [facility],  [address], [place] [address]      Provider Number: [id number]  Attending Physician Name and Address:  Jadine Toribio SQUIBB, MD  Relative Name and Phone Number:  friend, [name] @ [phone]    Current Level of Care: Hospital Recommended Level of Care: Skilled Nursing Facility Prior Approval Number:    Date Approved/Denied:   PASRR Number: [id number]  Discharge Plan: SNF    Current Diagnoses: Patient Active Problem List   Diagnosis Date Noted   Debility 08/02/2024   Need for emotional support 07/29/2024   Goals of care, counseling/discussion 07/29/2024   Pressure injury of skin 07/28/2024   Malignant ascites (HCC) 07/28/2024   Hydronephrosis, left 07/28/2024   Palliative care encounter 07/28/2024   Counseling and coordination of care 07/28/2024   Colitis 07/28/2024   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   Bilateral pleural effusion 07/09/2024   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   Ascites 07/08/2024   Confusion 07/08/2024   Ovarian cancer (HCC) 07/08/2024   CVA (cerebral vascular accident) (HCC) 06/22/2024   Constipation 06/09/2024   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   Dizziness 05/11/2024   Nausea 04/12/2024   Hyponatremia 01/06/2024   Urinary and fecal incontinence 11/27/2023   Itchy scalp 09/05/2023   Iron deficiency 07/04/2019   B12 deficiency 07/04/2019   Lipoma 07/04/2019   Fear of other medical care- signficant fears of drug S-E inhibiting pt's ability to take medications 07/02/2019   Insomnia 03/25/2018   Hyperlipidemia 03/25/2018   Hypertension, goal below 150/90 02/18/2018   MDD (major depressive  disorder), recurrent episode, moderate (HCC) 02/18/2018   Generalized anxiety disorder 03/19/2013    Orientation RESPIRATION BLADDER Height & Weight     Self, Place  Normal Indwelling catheter Weight: 108 lb 0.4 oz (49 kg) Height:  5' 4 (162.6 cm)  BEHAVIORAL SYMPTOMS/MOOD NEUROLOGICAL BOWEL NUTRITION STATUS      Incontinent Diet (regular)  AMBULATORY STATUS COMMUNICATION OF NEEDS Skin   Extensive Assist Verbally PU Stage and Appropriate Care PU Stage 1 Dressing: Daily                     Personal Care Assistance Level of Assistance  Bathing, Feeding, Dressing Bathing Assistance: Limited assistance Feeding assistance: Limited assistance Dressing Assistance: Limited assistance     Functional Limitations Info  Sight, Hearing, Speech Sight Info: Adequate Hearing Info: Adequate Speech Info: Adequate    SPECIAL CARE FACTORS FREQUENCY  PT (By licensed PT), OT (By licensed OT)     PT Frequency: 5x/wk OT Frequency: 5x/wk            Contractures Contractures Info: Not present    Additional Factors Info  Code Status, Allergies Code Status Info: Full Allergies Info: Codeine           Current Medications (08/02/2024):  This is the current hospital active medication list Current Facility-Administered Medications  Medication Dose Route Frequency Provider Last Rate Last Admin   acetaminophen  (TYLENOL ) tablet 500 mg  500 mg Oral Q6H PRN Hall, Carole N, DO       albuterol  (PROVENTIL ) (2.5 MG/3ML) 0.083% nebulizer solution 2.5 mg  2.5 mg Nebulization Q2H PRN Jadine Toribio SQUIBB, MD   2.5  mg at 07/30/24 2203   amLODipine  (NORVASC ) tablet 10 mg  10 mg Oral Daily Dibia, Pauline E, MD   10 mg at 08/02/24 0816   artificial tears ophthalmic solution 1 drop  1 drop Both Eyes PRN Shona Terry SAILOR, DO       atorvastatin  (LIPITOR) tablet 10 mg  10 mg Oral QHS Shona Terry N, DO   10 mg at 08/01/24 2143   busPIRone  (BUSPAR ) tablet 7.5 mg  7.5 mg Oral QHS Shona Terry N, DO   7.5 mg at  08/01/24 2143   cefTRIAXone  (ROCEPHIN ) 2 g in sodium chloride  0.9 % 100 mL IVPB  2 g Intravenous Q24H Jadine Toribio SQUIBB, MD 200 mL/hr at 08/02/24 1101 2 g at 08/02/24 1101   Chlorhexidine  Gluconate Cloth 2 % PADS 6 each  6 each Topical Daily Shona Terry SAILOR, DO   6 each at 08/02/24 9183   clopidogrel  (PLAVIX ) tablet 75 mg  75 mg Oral QPM Jadine Toribio SQUIBB, MD   75 mg at 08/01/24 2144   feeding supplement (ENSURE PLUS HIGH PROTEIN) liquid 237 mL  237 mL Oral BID BM Jadine Toribio SQUIBB, MD   237 mL at 08/02/24 0816   heparin  injection 5,000 Units  5,000 Units Subcutaneous Q8H Shona Terry N, DO   5,000 Units at 08/02/24 1317   hydrALAZINE  (APRESOLINE ) injection 10 mg  10 mg Intravenous Q4H PRN Dibia, Pauline E, MD   10 mg at 07/28/24 0556   HYDROmorphone  (DILAUDID ) injection 0.5 mg  0.5 mg Intravenous Q4H PRN Shona Terry N, DO   0.5 mg at 07/30/24 2023   melatonin tablet 5 mg  5 mg Oral QHS PRN Shona Terry N, DO   5 mg at 07/31/24 2241   metoprolol  tartrate (LOPRESSOR ) tablet 25 mg  25 mg Oral BID Dibia, Pauline E, MD   25 mg at 08/02/24 0816   oxyCODONE  (Oxy IR/ROXICODONE ) immediate release tablet 5-10 mg  5-10 mg Oral Q4H PRN Hall, Carole N, DO   10 mg at 07/31/24 2241   polyethylene glycol (MIRALAX  / GLYCOLAX ) packet 17 g  17 g Oral BID Jadine Toribio SQUIBB, MD   17 g at 08/01/24 0944   prochlorperazine  (COMPAZINE ) injection 5 mg  5 mg Intravenous Q6H PRN Shona Terry N, DO       senna (SENOKOT) tablet 8.6 mg  1 tablet Oral QHS Jadine Toribio SQUIBB, MD   8.6 mg at 07/31/24 2203   sodium chloride  flush (NS) 0.9 % injection 10-40 mL  10-40 mL Intracatheter Q12H Jadine Toribio SQUIBB, MD   10 mL at 08/02/24 1101   sodium chloride  flush (NS) 0.9 % injection 10-40 mL  10-40 mL Intracatheter PRN Jadine Toribio SQUIBB, MD         Discharge Medications: Please see discharge summary for a list of discharge medications.  Relevant Imaging Results:  Relevant Lab Results:   Additional Information SS#:  [id number]; chemo infusion q 3 weeks - next is 08/09/24  Kenner Lewan, LCSW     "

## 2024-08-02 NOTE — Progress Notes (Signed)
" °   08/02/24 1135  Spiritual Encounters  Type of Visit Initial  Care provided to: Patient;Friend  Referral source Physician  Reason for visit Advance directives  OnCall Visit No  Interventions  Spiritual Care Interventions Made Established relationship of care and support;Decision-making support/facilitation;Supported grief process;Reflective listening   I responded to a referral from Tinnie Radar, DO to support Ms Hingle in [place] completion.  At time of visit Ms Arens friend, [name], was at bedside. I asked to speak one on one with Ms Ambrosino and  we discussed her intentions for advanced care planning. She articulated desire to name friend and former colleague, [name] of [place] to be her primary management consultant. Ms Scalia indicated a current process with her attorney to complete durable POA and HCPOA. I suggested going ahead to complete HCPOA while an inpatient.  I discussed details with [name] who was able to supply contact info for Mr Liming. She debriefed with me their 54-yrs of friendship since college and shared beautiful photo of their friend group in the room. They are like sisters and since decline in Betsy's health in Oct this has created an anticipatory grief process.  I offered normalization of emotions and compassionate presence, inquiring about what self-care [name] was engaging. I offered some suggestions for next steps and made plan for follow up later this afternoon pending CT scheduling. Will continue to follow.  Latima Hamza L. Delores HERO.Div "

## 2024-08-02 NOTE — Progress Notes (Signed)
" °  Progress Note   Patient: Michele Tyler FMW:[id number] DOB: 1946/04/04 DOA: 07/26/2024     7 DOS: the patient was seen and examined on 08/02/2024   Brief hospital course: 79 year old woman PMH newly diagnosed ovarian cancer with metastatic disease, status post 1 round chemotherapy 12/22, presented with weakness, diarrhea, abdominal pain.  CT showed severe diffuse emphysematous cystitis consistent with severe infection high risk for sepsis.  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  Blood cultures remain no growth/final. Hemodynamics stable. Appreciate urology consultation, repeat imaging and continue antibiotics for now, follow-up recommendations from urology after imaging.  Continue Foley catheter, ultimate recommendations as per urology.   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 Lonn, Dr. Viktoria.  Status post 1 cycle of treatment complicated by poor performance status, malnutrition. Continue Foley catheter for now   Acquired pancytopenia Chemotherapy-induced neutropenia Secondary to chemotherapy.  Per oncology does not need transfusion or G-CSF  ANC trending up   Hypophosphatemia Left   PMH cerebellar stroke October 2025 Memory impairment Currently disoriented.     Essential hypertension Remains stable.  Continue Norvasc , metoprolol    Possible colitis Chronic constipation Fecal impaction Continue bowel regimen Disimpact as needed     Subjective:  Feels okay today  Physical Exam: Vitals:   08/01/24 2113 08/02/24 0656 08/02/24 1014 08/02/24 1347  BP: 121/70 (!) 145/80 129/86 129/74  Pulse:  (!) 110 86 87 91  Resp: 17 16 18 16   Temp: 97.9 F (36.6 C) (!) 97.4 F (36.3 C)  (!) 97.4 F (36.3 C)  TempSrc: Oral Oral  Oral  SpO2: 95% 95% 97% 96%  Weight:      Height:       Physical Exam Vitals reviewed.  Constitutional:      General: She is not in acute distress.    Appearance: She is not ill-appearing or toxic-appearing.  Cardiovascular:     Rate and Rhythm: Normal rate and regular rhythm.     Heart sounds: No murmur heard. Pulmonary:     Effort: Pulmonary effort is normal. No respiratory distress.     Breath sounds: No wheezing, rhonchi or rales.  Neurological:     Mental Status: She is alert.  Psychiatric:        Mood and Affect: Mood normal.        Behavior: Behavior normal.     Data Reviewed: WBC up to 1.9 ANC up to 600 Hemoglobin stable at 10.5  Family Communication: Alyse Kay at bedside  Disposition: Status is: Inpatient Remains inpatient appropriate because: Emphysematous cystitis     Time spent: 65 urology minutes  Author: Toribio Door, MD 08/02/2024 3:42 PM  For on call review [email or web address].    "

## 2024-08-02 NOTE — Consult Note (Signed)
 "  Urology Consult Note   Requesting Attending Physician:  Jadine Toribio SQUIBB, MD Service Providing Consult: Urology  Consulting Attending: Dr. Lovie   Reason for Consult: Emphysematous cystitis with probable urinary obstruction 2/2 constipation  HPI: Michele Tyler is seen in consultation for reasons noted above at the request of Jadine Toribio SQUIBB, MD. Patient is a 79 y.o. female presenting to [facility] emergency department via EMS with complaint of diarrhea, right side abdominal pain, and generalized weakness x 2d.  Patient has newly diagnosed metastatic ovarian cancer, s/p first cycle of chemotherapy.  CT imaging revealed bilateral pleural effusions, malignant ascites, left hydronephrosis, severe emphysematous cystitis, and high pelvic stool burden, for which she was admitted to the hospitalist service.  She has had a Foley catheter since 07/26/2024.  She has received 3 days of Zosyn  and additional 4 days of ceftriaxone .  She remains afebrile with an expectedly suppressed WBC.  On my arrival patient was finishing breakfast up in chair.  She was alert, oriented, in no distress.  She was accompanied by a lifelong friend who has been assisting to the best of her ability, with her caretaking.  No family was noted.  We reviewed her case and plan and all questions were answered.  ------------------  Assessment:   79 y.o. female with emphysematous cystitis with high pelvic stool burden   Recommendations: # Emphysematous cystitis # Ovarian cancer with metastatic disease # Left hydronephrosis # Large pelvic stool burden  CT A/P notes severe case of emphysematous cystitis.  S/p 7 days of broad IV ABX.  We will reevaluate with repeat imaging today and make a decision about further antibiotic treatment.  An extended course would not be uncommon with an advanced case such as this.  Foley catheter has been in place to assist with bladder decompression.  She has benefited from maximizing  drainage as well as ensuring complete emptying in the context of a high level of pelvic stool burden.  She was reported to have undergone manual disimpaction several days ago.  We will make a decision on the voiding trial after repeat imaging has been collected.  Mild UPJ obstruction.  Parenchyma comparable to contralateral side, so would not be completely obstructed if congenital.  This could be somewhat exacerbated by the malignant ascites, though they are not at a volume necessitating paracentesis at this time.  Appropriate UOP and renal function is preserved.  Recommend monitoring conservatively at this time.  Should her renal function become impaired acutely +/- ascending infection develop into an obstructed pyelonephrosis, percutaneous nephrostomy drainage may be indicated in the future.  Urology will follow  Case and plan discussed with Dr. Lovie  Past Medical 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   Stroke (cerebrum) [facility])     Past Surgical History:  Past Surgical History:  Procedure Laterality Date   DILATION AND CURETTAGE OF UTERUS  2002   FACELIFT  2005   IR IMAGING GUIDED PORT INSERTION  07/13/2024   IR PARACENTESIS  06/23/2024   TRANSESOPHAGEAL ECHOCARDIOGRAM (CATH LAB) N/A 06/16/2024   Procedure: TRANSESOPHAGEAL ECHOCARDIOGRAM;  Surgeon: Delford Maude BROCKS, MD;  Location: [facility] INVASIVE CV LAB;  Service: Cardiovascular;  Laterality: N/A;    Medication: Current Facility-Administered Medications  Medication Dose Route Frequency Provider Last Rate Last Admin   acetaminophen  (TYLENOL ) tablet 500 mg  500 mg Oral Q6H PRN Shona Terry SAILOR, DO  albuterol  (PROVENTIL ) (2.5 MG/3ML) 0.083% nebulizer solution 2.5 mg  2.5 mg Nebulization Q2H PRN Jadine Toribio SQUIBB, MD   2.5 mg at 07/30/24 2203   amLODipine  (NORVASC ) tablet 10 mg  10 mg Oral Daily Dibia, Pauline E, MD   10 mg at 08/02/24 0816    artificial tears ophthalmic solution 1 drop  1 drop Both Eyes PRN Shona Laurence N, DO       atorvastatin  (LIPITOR) tablet 10 mg  10 mg Oral QHS Shona Laurence N, DO   10 mg at 08/01/24 2143   busPIRone  (BUSPAR ) tablet 7.5 mg  7.5 mg Oral QHS Shona Laurence N, DO   7.5 mg at 08/01/24 2143   cefTRIAXone  (ROCEPHIN ) 2 g in sodium chloride  0.9 % 100 mL IVPB  2 g Intravenous Q24H Jadine Toribio SQUIBB, MD       Chlorhexidine  Gluconate Cloth 2 % PADS 6 each  6 each Topical Daily Shona Laurence SAILOR, DO   6 each at 08/02/24 9183   clopidogrel  (PLAVIX ) tablet 75 mg  75 mg Oral QPM Jadine Toribio SQUIBB, MD   75 mg at 08/01/24 2144   feeding supplement (ENSURE PLUS HIGH PROTEIN) liquid 237 mL  237 mL Oral BID BM Jadine Toribio SQUIBB, MD   237 mL at 08/02/24 0816   heparin  injection 5,000 Units  5,000 Units Subcutaneous Q8H Shona Laurence N, DO   5,000 Units at 08/02/24 9396   hydrALAZINE  (APRESOLINE ) injection 10 mg  10 mg Intravenous Q4H PRN Dibia, Pauline E, MD   10 mg at 07/28/24 0556   HYDROmorphone  (DILAUDID ) injection 0.5 mg  0.5 mg Intravenous Q4H PRN Shona Laurence N, DO   0.5 mg at 07/30/24 2023   iohexol  (OMNIPAQUE ) 9 MG/ML oral solution 500 mL  500 mL Oral Q1H Jadine Toribio SQUIBB, MD   500 mL at 08/02/24 9078   melatonin tablet 5 mg  5 mg Oral QHS PRN Shona Laurence N, DO   5 mg at 07/31/24 2241   metoprolol  tartrate (LOPRESSOR ) tablet 25 mg  25 mg Oral BID Dibia, Pauline E, MD   25 mg at 08/02/24 0816   oxyCODONE  (Oxy IR/ROXICODONE ) immediate release tablet 5-10 mg  5-10 mg Oral Q4H PRN Hall, Carole N, DO   10 mg at 07/31/24 2241   polyethylene glycol (MIRALAX  / GLYCOLAX ) packet 17 g  17 g Oral BID Jadine Toribio SQUIBB, MD   17 g at 08/01/24 0944   prochlorperazine  (COMPAZINE ) injection 5 mg  5 mg Intravenous Q6H PRN Shona Laurence N, DO       senna (SENOKOT) tablet 8.6 mg  1 tablet Oral QHS Jadine Toribio SQUIBB, MD   8.6 mg at 07/31/24 2203    Allergies: Allergies[1]  Social History: Social History[2]  Family  History Family History  Problem Relation Age of Onset   Hypertension Mother    Depression Father    Hypertension Father     Review of Systems  Genitourinary:  Negative for dysuria, flank pain, frequency, hematuria and urgency.     Objective   Vital signs in last 24 hours: BP (!) 145/80 (BP Location: Right Arm)   Pulse 86   Temp (!) 97.4 F (36.3 C) (Oral)   Resp 16   Ht 5' 4 (1.626 m)   Wt 49 kg   SpO2 95%   BMI 18.54 kg/m   Physical Exam General: A&O, resting, appropriate HEENT: [place]/AT Pulmonary: Normal work of breathing Cardiovascular: no cyanosis GU: Foley catheter in place draining clear  yellow urine   Most Recent Labs: Lab Results  Component Value Date   WBC 1.9 (L) 08/02/2024   HGB 10.5 (L) 08/02/2024   HCT 32.5 (L) 08/02/2024   PLT 440 (H) 08/02/2024    Lab Results  Component Value Date   NA 136 08/01/2024   K 4.5 08/01/2024   CL 103 08/01/2024   CO2 25 08/01/2024   BUN 20 08/01/2024   CREATININE 0.53 08/01/2024   CALCIUM  9.2 08/01/2024   MG 2.1 08/01/2024   PHOS 3.1 08/01/2024    Lab Results  Component Value Date   INR 1.0 06/22/2024     Urine Culture: @[id number] (laburin,org,r9620,r9621)@   IMAGING: No results found.  ------  Ole Bourdon, NP Pager: [phone]   Please contact the urology consult pager with any further questions/concerns.     [1]  Allergies Allergen Reactions   Codeine Nausea Only  [2]  Social History Tobacco Use   Smoking status: Former    Current packs/day: 0.00    Average packs/day: 1 pack/day for 10.0 years (10.0 ttl pk-yrs)    Types: Cigarettes    Start date: 07/29/1976    Quit date: 1988    Years since quitting: 38.0   Smokeless tobacco: Never  Vaping Use   Vaping status: Never Used  Substance Use Topics   Alcohol  use: Yes    Alcohol /week: 1.0 - 2.0 standard drink of alcohol     Types: 1 - 2 Standard drinks or equivalent per week    Comment: social   Drug use: No   "

## 2024-08-02 NOTE — Progress Notes (Signed)
 Physical Therapy Treatment Patient Details Name: Michele Tyler MRN: [id number] DOB: 1945/08/24 Today's Date: 08/02/2024   History of Present Illness 79 yo female admitted with severe emphysematous cystitis. Hx of CVA 04/2024, ovarian cancer with mets, depression, anxiety, constipation    PT Comments   Pt admitted with above diagnosis.  Pt currently with functional limitations due to the deficits listed below (see PT Problem List). Pt in bed when PT arrived. Pt agreeable to therapy intervention. Pt required min A for supine to sit, min A for sit to stand and RW for transfers to a variety of surfaces, cues for RW management and safety, obstacle navigation 15 and 18 feet with gait in personal room RW and cues, pt tolerated static standing 1:53 however posterior lean min A and 1 UE support at sink. Pt left in recliner and all needs in place.  Patient will benefit from continued inpatient follow up therapy, <3 hours/day. Pt will benefit from acute skilled PT to increase their independence and safety with mobility to allow discharge.      If plan is discharge home, recommend the following: A lot of help with walking and/or transfers;A lot of help with bathing/dressing/bathroom;Assistance with cooking/housework;Assist for transportation;Help with stairs or ramp for entrance   Can travel by private vehicle        Equipment Recommendations  None recommended by PT    Recommendations for Other Services       Precautions / Restrictions Precautions Precautions: Fall Recall of Precautions/Restrictions: Impaired Restrictions Weight Bearing Restrictions Per Provider Order: No     Mobility  Bed Mobility Overal bed mobility: Needs Assistance Bed Mobility: Supine to Sit     Supine to sit: Min assist, HOB elevated     General bed mobility comments: Increased time to complete. Multimodel cueing provided to initiate, attend to task and sequence with use of hospital bed features     Transfers Overall transfer level: Needs assistance Equipment used: Rolling walker (2 wheels) Transfers: Sit to/from Stand Sit to Stand: Min assist           General transfer comment: min cues, increased time from a variety of surfaces, pt exhibits difficulty with RW management    Ambulation/Gait Ambulation/Gait assistance: Min assist Gait Distance (Feet): 18 Feet Assistive device: Rolling walker (2 wheels) Gait Pattern/deviations: Step-to pattern, Trunk flexed Gait velocity: decreased     General Gait Details: limited foot clearance, narrow BOS and flexed posture, cues and min A for Rw management for turns, approach to sitting surfaces and obstacle navigation bed to commode and commode to recliner   Stairs             Wheelchair Mobility     Tilt Bed    Modified Rankin (Stroke Patients Only)       Balance Overall balance assessment: Needs assistance Sitting-balance support: Feet supported Sitting balance-Leahy Scale: Fair     Standing balance support: Bilateral upper extremity supported, During functional activity, Reliant on assistive device for balance Standing balance-Leahy Scale: Poor Standing balance comment: pt limited by fatigue following ambulation to bathroom and unable to maintain static standing > 2 min and required min A cues and 1 UE support at sink                            Communication Communication Communication: No apparent difficulties  Cognition Arousal: Alert Behavior During Therapy: Flat affect   PT - Cognitive impairments: History of cognitive impairments, Safety/Judgement,  Problem solving, Initiation, Sequencing, Orientation                       PT - Cognition Comments: cues and increased time for motor processing and planing Following commands: Impaired Following commands impaired: Follows one step commands with increased time    Cueing Cueing Techniques: Verbal cues, Gestural cues, Tactile cues,  Visual cues  Exercises      General Comments        Pertinent Vitals/Pain      Home Living                          Prior Function            PT Goals (current goals can now be found in the care plan section) Acute Rehab PT Goals Patient Stated Goal: none stated Progress towards PT goals: Progressing toward goals    Frequency    Min 3X/week      PT Plan      Co-evaluation              AM-PAC PT 6 Clicks Mobility   Outcome Measure  Help needed turning from your back to your side while in a flat bed without using bedrails?: A Little Help needed moving from lying on your back to sitting on the side of a flat bed without using bedrails?: A Little Help needed moving to and from a bed to a chair (including a wheelchair)?: A Little Help needed standing up from a chair using your arms (e.g., wheelchair or bedside chair)?: A Little Help needed to walk in hospital room?: A Little Help needed climbing 3-5 steps with a railing? : Total 6 Click Score: 16    End of Session Equipment Utilized During Treatment: Gait belt Activity Tolerance: Patient tolerated treatment well Patient left: with call bell/phone within reach;in chair;with chair alarm set Nurse Communication: Mobility status PT Visit Diagnosis: Muscle weakness (generalized) (M62.81);Difficulty in walking, not elsewhere classified (R26.2);Unsteadiness on feet (R26.81)     Time: 8942-8881 PT Time Calculation (min) (ACUTE ONLY): 21 min  Charges:    $Therapeutic Activity: 8-22 mins PT General Charges $$ ACUTE PT VISIT: 1 Visit                     [place], PT Acute Rehab    [place] VEAR Drone 08/02/2024, 6:01 PM

## 2024-08-02 NOTE — Plan of Care (Signed)
 ?  Problem: Clinical Measurements: ?Goal: Ability to maintain clinical measurements within normal limits will improve ?Outcome: Progressing ?Goal: Will remain free from infection ?Outcome: Progressing ?Goal: Diagnostic test results will improve ?Outcome: Progressing ?  ?

## 2024-08-02 NOTE — Progress Notes (Signed)
 " Daily Progress Note   Patient Name: Michele Tyler       Date: 08/02/2024 DOB: [birth year]  Age: 79 y.o. MRN#: [id number] Attending Physician: Jadine Toribio SQUIBB, MD Primary Care Physician: [name], MD Admit Date: 07/26/2024 Length of Stay: 7 days  Reason for Consultation/Follow-up: Establishing goals of care  Subjective:   Reviewed EMR including recent documentation from hospitalist and neurology.  Urology planning to repeat imaging to determine need for further antibiotic treatment with patient's immunocompromise state.  They will make decision about voiding trial for Foley after repeat imaging has been obtained.  At time of EMR review of past 24 hours patient has not required any as needed oxycodone  for pain management.  Discussed care with chaplain to assist in coordinating completion of ACP documentation today.  Patient has continued to state she would want her friend, [name], as her HCPOA. Alyse Kay,  at bedside receiving emotional support from spiritual care.  At this time continuing current medical management with plan for patient to hopefully go to rehab to regain her strength as plans to pursue outpatient cancer directed therapies.  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  Imaging upon admission showed severe emphysematous cystitis consistent with severe infection and high risk for sepsis.  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  Oncology and PCCM consulted for recommendations.  Palliative medicine team consulted to assist with complex medical decision making.   Recommendations/Plan: # Complex medical decision making/goals of care:       - Have continued discussions with patient and friends during hospitalization.  Continuing appropriate medical interventions at this time.  Oncology has discussed providing patient with further cancer directed therapies.  Patient stating she would want to pursue cancer directed therapies and would be willing to go to rehab to regain strength to get them.  TOC to assist with discharge coordination moving forward.  Palliative medicine team continuing to follow with patient's medical journey.  - Patient has continuously stated during multiple visits that she would want her friend, Herminio Liming, to be HCPOA.  Have involved chaplain to assist with completion of documentation for this today hopefully.                -  Code Status: Full Code    # Discharge Planning: To Be Determined - Placed referral for outpatient PMT follow-up at [facility] once discharged.   Thank you for allowing the palliative care team to participate in the care [name].  Tinnie Radar, DO Palliative Care Provider PMT # [phone]  If patient remains symptomatic  despite maximum doses, please call PMT at [phone] between 0700 and 1900. Outside of these hours, please call attending, as PMT does not have night coverage. "

## 2024-08-02 NOTE — TOC Initial Note (Signed)
 Transition of Care [facility]) - Initial/Assessment Note    Patient Details  Name: Michele Tyler MRN: [id number] Date of Birth: [birth year]  [facility] CM/SW Contact:    NORMAN ASPEN, LCSW Phone Number: 08/02/2024, 3:17 PM  Clinical Narrative:                  Met with pt and friend, [name], today to introduce IP CM role with dc planning.  Both aware that therapies have recommended SNF for short term rehab and pt is agreeable with plan.  Notes she does live alone and has good support from friends.  Currently working with [employer] service to get necessary healthcare documents completed.  Will begin SNF bed search.  Per pt and friend, it appears she has chemo infusions q 3 weeks with next one on 08/09/24 - hopefully this will not be a barrier for SNF.    Expected Discharge Plan: Skilled Nursing Facility Barriers to Discharge: Continued Medical Work up, English As A Second Language Teacher, SNF Pending bed offer   Patient Goals and CMS Choice Patient states their goals for this hospitalization and ongoing recovery are:: return home following rehab          Expected Discharge Plan and Services In-house Referral: Clinical Social Work   Post Acute Care Choice: Skilled Nursing Facility Living arrangements for the past 2 months: Single Family Home                 DME Arranged: N/A DME Agency: NA                  Prior Living Arrangements/Services Living arrangements for the past 2 months: Single Family Home Lives with:: Self Patient language and need for interpreter reviewed:: Yes Do you feel safe going back to the place where you live?: Yes      Need for Family Participation in Patient Care: Yes (Comment) Care giver support system in place?: No (comment)      Activities of Daily Living   ADL Screening (condition at time of admission) Independently performs ADLs?: No Does the patient have a NEW difficulty with bathing/dressing/toileting/self-feeding that is expected to  last >3 days?: No Does the patient have a NEW difficulty with getting in/out of bed, walking, or climbing stairs that is expected to last >3 days?: Yes (Initiates electronic notice to provider for possible PT consult) Does the patient have a NEW difficulty with communication that is expected to last >3 days?: No Is the patient deaf or have difficulty hearing?: No Does the patient have difficulty seeing, even when wearing glasses/contacts?: No Does the patient have difficulty concentrating, remembering, or making decisions?: No  Permission Sought/Granted Permission sought to share information with : Other (comment), Facility Medical Sales Representative Permission granted to share information with : Yes, Verbal Permission Granted  Share Information with NAME: friend, [name] @ [phone] or friend, [name] @ [phone]  Permission granted to share info w AGENCY: SNFs        Emotional Assessment Appearance:: Appears stated age Attitude/Demeanor/Rapport: Gracious Affect (typically observed): Accepting Orientation: : Oriented to Self, Oriented to Place Alcohol  / Substance Use: Not Applicable Psych Involvement: No (comment)  Admission diagnosis:  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] Colitis [K52.9] Peritoneal carcinomatosis [facility]) [C78.6] Patient Active Problem List   Diagnosis Date Noted   Debility 08/02/2024   Need for emotional support 07/29/2024   Goals of care, counseling/discussion 07/29/2024   Pressure injury of skin 07/28/2024   Malignant ascites (HCC) 07/28/2024   Hydronephrosis, left 07/28/2024  Palliative care encounter 07/28/2024   Counseling and coordination of care 07/28/2024   Colitis 07/28/2024   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   Bilateral pleural effusion 07/09/2024   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   Ascites 07/08/2024   Confusion 07/08/2024   Ovarian cancer (HCC) 07/08/2024   CVA  (cerebral vascular accident) (HCC) 06/22/2024   Constipation 06/09/2024   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   Dizziness 05/11/2024   Nausea 04/12/2024   Hyponatremia 01/06/2024   Urinary and fecal incontinence 11/27/2023   Itchy scalp 09/05/2023   Iron deficiency 07/04/2019   B12 deficiency 07/04/2019   Lipoma 07/04/2019   Fear of other medical care- signficant fears of drug S-E inhibiting pt's ability to take medications 07/02/2019   Insomnia 03/25/2018   Hyperlipidemia 03/25/2018   Hypertension, goal below 150/90 02/18/2018   MDD (major depressive disorder), recurrent episode, moderate (HCC) 02/18/2018   Generalized anxiety disorder 03/19/2013   PCP:  [name], MD Pharmacy:   [facility] - [place], [place] - [address] [place]  [address] Phone: [phone] Fax: [phone]  [facility] - [place], [place] - [address] [place] [address] Phone: [phone] Fax: [phone]  [facility] [address] [place] [address] Phone: [phone] Fax: [phone]     Social Drivers of Health (SDOH) Social History: SDOH Screenings   Food Insecurity: No Food Insecurity (07/27/2024)  Housing: Low Risk (07/27/2024)  Transportation Needs: No Transportation Needs (07/27/2024)  Utilities: Not At Risk (07/27/2024)  Alcohol  Screen: Low Risk (08/04/2023)  Depression (PHQ2-9): High Risk (06/09/2024)  Financial Resource Strain: Low Risk (08/04/2023)  Physical Activity: Sufficiently Active (08/04/2023)  Social Connections: Unknown (07/27/2024)  Recent Concern: Social Connections - Moderately Isolated (06/22/2024)  Stress: Stress Concern Present (08/04/2023)  Tobacco Use: Medium Risk (07/28/2024)   SDOH Interventions:     Readmission Risk Interventions    08/02/2024    3:14 PM  Readmission Risk Prevention Plan  Transportation Screening Complete   PCP or Specialist Appt within 3-5 Days Complete  HRI or Home Care Consult Complete  Social Work Consult for Recovery Care Planning/Counseling Complete  Palliative Care Screening Complete  Medication Review Oceanographer) Complete

## 2024-08-02 NOTE — Progress Notes (Signed)
" °   08/02/24 1645  Spiritual Encounters  Type of Visit Follow up  Care provided to: Patient;Friend  Referral source Chaplain assessment  Reason for visit Advance directives  OnCall Visit No   I provided follow up support to Ms Elika Godar and also to her friend, [name].  Prior to this visit I did consult with the [facility] on-call, Marshall Favre, RN, who was standing by to notarized ACP docs.  While Ms Scheffler was still off the floor at CT, [name] shared that the [facility] document was completed. She engaged with me to debrief further around multiple experiences of grief and loss, all heightened by concern for [facility]. I encouraged her on-going ways to engage her self-care, boundary her time and to trust the care team.  Ms Nofziger returned from CT around 1625 and stated she felt some nausea. After assessing her needs and well-being, I determined it would be better to try to notarize HCPOA in the morning of 08/04/2023. I sent a secure chat to communicate this intention to [facility], Dr Clayton, and to [employer].  Thiago Ragsdale L. Delores HERO.Div "

## 2024-08-03 LAB — CBC WITH DIFFERENTIAL/PLATELET
Basophils Absolute: 0 K/uL (ref 0.0–0.1)
Basophils Relative: 0 %
Eosinophils Absolute: 0 K/uL (ref 0.0–0.5)
Eosinophils Relative: 2 %
HCT: 30.7 % — ABNORMAL LOW (ref 36.0–46.0)
Hemoglobin: 9.7 g/dL — ABNORMAL LOW (ref 12.0–15.0)
Lymphocytes Relative: 33 %
Lymphs Abs: 0.6 K/uL — ABNORMAL LOW (ref 0.7–4.0)
MCH: 27.1 pg (ref 26.0–34.0)
MCHC: 31.6 g/dL (ref 30.0–36.0)
MCV: 85.8 fL (ref 80.0–100.0)
Monocytes Absolute: 0.4 K/uL (ref 0.1–1.0)
Monocytes Relative: 21 %
Neutro Abs: 0.8 K/uL — ABNORMAL LOW (ref 1.7–7.7)
Neutrophils Relative %: 44 %
Platelets: 451 K/uL — ABNORMAL HIGH (ref 150–400)
RBC: 3.58 MIL/uL — ABNORMAL LOW (ref 3.87–5.11)
RDW: 14.6 % (ref 11.5–15.5)
Smear Review: NORMAL
WBC: 1.9 K/uL — ABNORMAL LOW (ref 4.0–10.5)
nRBC: 0 % (ref 0.0–0.2)

## 2024-08-03 MED ORDER — CEFADROXIL 500 MG PO CAPS
1000.0000 mg | ORAL_CAPSULE | Freq: Two times a day (BID) | ORAL | Status: DC
  Administered 2024-08-03 – 2024-08-06 (×6): 1000 mg via ORAL
  Filled 2024-08-03 (×6): qty 2

## 2024-08-03 NOTE — Plan of Care (Signed)
 ?  Problem: Clinical Measurements: ?Goal: Ability to maintain clinical measurements within normal limits will improve ?Outcome: Progressing ?Goal: Will remain free from infection ?Outcome: Progressing ?Goal: Diagnostic test results will improve ?Outcome: Progressing ?  ?

## 2024-08-03 NOTE — Progress Notes (Signed)
 Mobility Specialist Progress Note:   08/03/24 1011  Mobility  Activity Pivoted/transferred to/from [facility]  Level of Assistance Minimal assist, patient does 75% or more  Assistive Device Front wheel walker  Distance Ambulated (ft) 2 ft  Activity Response Tolerated well  Mobility Referral Yes  Mobility visit 1 Mobility  Mobility Specialist Start Time (ACUTE ONLY) [id number]  Mobility Specialist Stop Time (ACUTE ONLY) 1005  Mobility Specialist Time Calculation (min) (ACUTE ONLY) 36 min   Pt was received in bed and agreeable to mobilize. Min A sit to stand. Pt had BM in bed, xfer to [facility] for patient care. Returned to recliner after patient care needs were met. Call bell in reach and chair alarm on. Left in room with family.   [employer] - Mobility Specialist

## 2024-08-03 NOTE — Progress Notes (Signed)
" °  Progress Note   Patient: Michele Tyler FMW:[id number] DOB: 1946/06/28 DOA: 07/26/2024     8 DOS: the patient was seen and examined on 08/03/2024   Brief hospital course: 79 year old woman PMH newly diagnosed ovarian cancer with metastatic disease, status post 1 round chemotherapy 12/22, presented with weakness, diarrhea, abdominal pain.  CT showed severe diffuse emphysematous cystitis consistent with severe infection high risk for sepsis.  Treated for cystitis.  Seen by oncology.  Outpatient treatment plans are unclear at this time.  Plan for SNF and follow-up with oncology.  Consultants PCCM Palliative Medicine Urology  Procedures/Events 12/29 admission for severe emphysematous cystitis  Assessment and Plan: Severe emphysematous cystitis present on admission No signs or symptoms of sepsis. Urine culture unrevealing.  Blood cultures remain no growth/final. Hemodynamics stable. Appreciate urology consultation, repeat imaging showed Foley in bladder, residual moderate air, decrease intramural air within bladder wall. Per urology can discontinue Foley catheter and change to oral antibiotics for another week.   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 Lonn, Dr. Viktoria.  Status post 1 cycle of treatment complicated by poor performance status, malnutrition. Follow-up with urology as an outpatient Large volume malignant ascites, moderate to large bilateral pleural effusions, asymptomatic   Acquired pancytopenia Chemotherapy-induced neutropenia Secondary to chemotherapy.  Per oncology does not need transfusion or G-CSF  ANC trending up   Hypophosphatemia    PMH cerebellar stroke October 2025 Memory impairment Currently disoriented.     Essential hypertension Remains stable.  Continue Norvasc , metoprolol    Possible  colitis Chronic constipation Fecal impaction Continue bowel regimen Disimpact as needed      Subjective:  Family on the phone, confused  Physical Exam: Vitals:   08/02/24 1843 08/02/24 2022 08/03/24 0621 08/03/24 1348  BP: 132/75 129/82 136/86 120/65  Pulse: 85 97 88 94  Resp: 16 18 16 18   Temp: 98.1 F (36.7 C) 98 F (36.7 C) (!) 97.5 F (36.4 C) 98 F (36.7 C)  TempSrc: Oral Oral Oral Oral  SpO2: 96% 95% 95% 95%  Weight:      Height:       Physical Exam Vitals reviewed.  Constitutional:      General: She is not in acute distress.    Appearance: She is not ill-appearing or toxic-appearing.  Cardiovascular:     Rate and Rhythm: Normal rate and regular rhythm.     Heart sounds: No murmur heard. Pulmonary:     Effort: Pulmonary effort is normal. No respiratory distress.     Breath sounds: No wheezing, rhonchi or rales.  Neurological:     Mental Status: She is alert.  Psychiatric:     Comments: Confused     Data Reviewed: WBC 1.9, ANC up to 800, hemoglobin stable 9.7  Family Communication: Friend [name] at bedside  Disposition: Status is: Inpatient Remains inpatient appropriate because: Await SNF     Time spent: 20 treated for cystitis with rapid clinical improvement, seen by minutes  Author: Toribio Door, MD 08/03/2024 8:33 PM  For on call review [email or web address].    "

## 2024-08-03 NOTE — Progress Notes (Signed)
 "    Subjective: Michele Tyler was alert, oriented, and in good spirits this morning.  She was accompanied by her best friend and caretaker.  We reviewed the CT findings and all questions were answered to their satisfaction.  No acute events overnight.  Objective: Vital signs in last 24 hours: Temp:  [97.4 F (36.3 C)-98.1 F (36.7 C)] 97.5 F (36.4 C) (01/06 0621) Pulse Rate:  [85-97] 88 (01/06 0621) Resp:  [16-18] 16 (01/06 0621) BP: (129-139)/(74-86) 136/86 (01/06 0621) SpO2:  [95 %-97 %] 95 % (01/06 9378)  Assessment/Plan: # Emphysematous cystitis # Ovarian cancer with metastatic disease # Left hydronephrosis # Large pelvic stool burden  Pelvic stool burden largely cleared.  Continue regular bowel regimen.  Emphysematous cystitis is largely resolved.  There are a few locules of air remaining in the bladder wall.  Reasonable to transition her to oral treatment if she is nearing discharge.  Would continue with another week of comparable cephalosporin or Bactrim.  Foley catheter can be removed at the discretion of primary team  Hydronephrosis unchanged compared to previous imaging from this hospitalization or November.  Follow with [facility] to monitor.  Contact information and instructions have been placed in discharge instructions.  Urology will sign off at this time.  Please feel free to contact us  with any questions, concerns, or acute urologic changes.  Intake/Output from previous day: 01/05 0701 - 01/06 0700 In: 890 [P.O.:780; I.V.:10; IV Piggyback:100] Out: 800 [Urine:800]  Intake/Output this shift: No intake/output data recorded.  Physical Exam:  General: Alert and oriented CV: No cyanosis Lungs: equal chest rise Gu: Foley catheter in place draining clear yellow urine  Lab Results: Recent Labs    08/01/24 0526 08/02/24 0436 08/03/24 0455  HGB 10.5* 10.5* 9.7*  HCT 33.0* 32.5* 30.7*   BMET Recent Labs    08/01/24 0526 08/02/24 0436 08/03/24 0455  NA  136  --   --   K 4.5  --   --   CL 103  --   --   CO2 25  --   --   GLUCOSE 92  --   --   BUN 20  --   --   CREATININE 0.53  --   --   CALCIUM  9.2  --   --   HGB 10.5* 10.5* 9.7*  WBC 1.6* 1.9* 1.9*     Studies/Results: CT ABDOMEN PELVIS W CONTRAST Result Date: 08/02/2024 CLINICAL DATA:  Follow-up emphysematous cystitis EXAM: CT ABDOMEN AND PELVIS WITH CONTRAST TECHNIQUE: Multidetector CT imaging of the abdomen and pelvis was performed using the standard protocol following bolus administration of intravenous contrast. RADIATION DOSE REDUCTION: This exam was performed according to the departmental dose-optimization program which includes automated exposure control, adjustment of the mA and/or kV according to patient size and/or use of iterative reconstruction technique. CONTRAST:  80mL OMNIPAQUE  IOHEXOL  350 MG/ML SOLN COMPARISON:  CT 07/26/2024, 06/22/2024 FINDINGS: Lower chest: Lung bases demonstrate moderate to large bilateral pleural effusions with probable compressive atelectasis in the lower lobes. Moderate hiatal hernia. Hepatobiliary: Grossly stable liver hypodensities. Hyperdensity now present in the gallbladder, could reflect excreted contrast, no calcified stones present on the prior exam. Mild diffuse gallbladder wall thickening is suspected. Focal wall thickening at the fundus, possible adenomyomatosis. No biliary dilatation. Mild periportal edema as before Pancreas: No inflammation or ductal dilatation Spleen: Normal in size without focal abnormality. Adrenals/Urinary Tract: Adrenal glands are normal. Cysts and subcentimeter hypodensities too small to further characterize, no specific imaging follow-up is  recommended. No right hydronephrosis. Stable mild hydronephrosis on the left without hydroureter. Foley catheter within the bladder. Residual moderate air within the bladder. Decreased intramural air within the bladder wall with a few residual foci of air along the roof of the bladder.  There is mild diffuse bladder wall thickening. Stomach/Bowel: Stomach is decompressed. No dilated small bowel. Redemonstrated prominent wall thickening of the sigmoid colon. Upstream mild fluid distension of colon with enteral contrast. Vascular/Lymphatic: Aortic atherosclerosis. No enlarged abdominal or pelvic lymph nodes. Reproductive: Calcified uterine fibroid in the pelvis. Heterogenous appearing left greater than right ovary without dominant mass. Other: No free air. Large volume ascites. Mild diffuse peritoneal enhancement. Extensive peritoneal metastatic disease with omental caking and peritoneal nodularity and thickening. Musculoskeletal: No acute or suspicious osseous abnormality IMPRESSION: 1. Foley catheter within the bladder. Residual moderate air within the bladder. Decreased intramural air within the bladder wall with a few residual foci of air along the roof of the bladder. Mild diffuse bladder wall thickening. 2. Redemonstrated prominent wall thickening of the sigmoid colon, question colitis. Upstream mild fluid distension of colon with enteral contrast. 3. Large volume ascites with mild diffuse peritoneal enhancement. Extensive peritoneal metastatic disease with omental caking and peritoneal nodularity and thickening. 4. Moderate to large bilateral pleural effusions with probable compressive atelectasis in the lower lobes. 5. Stable mild left hydronephrosis without hydroureter, possible UPJ obstruction. 6. Aortic atherosclerosis. Aortic Atherosclerosis (ICD10-I70.0). Electronically Signed   By: Luke Bun M.D.   On: 08/02/2024 22:28      LOS: 8 days   Ole Bourdon, NP [facility] Pager: [phone]  08/03/2024, 10:04 AM  "

## 2024-08-03 NOTE — Discharge Instructions (Signed)
 Urology follow-up:  Please follow-up with [facility] to establish care within a couple weeks of discharging home to establish care and monitor hydronephrosis of left kidney.  [facility] [address] floor [place], [place]  [address] [phone]

## 2024-08-03 NOTE — Progress Notes (Addendum)
 I met with Michele Tyler to assist with getting HCPOA paperwork notarized.  She was able to articulate verbally that she wants Michele Tyler to be her HCPOA.  This is consistent with what was documented by provider, Michele Tyler, and chaplain Michele Tyler. Michele is also listed as her primary contact in her chart. I explained the process to notarize the document and she was agreeable and ready to proceed. When notary and witnesses were present, she was unable to sign the document.  Document was returned to the shadow chart.  It is complete except for the signature.  After this, I provided support through listening and prayer. She was able to articulate that her primary concern was that her pets be taken care of. They are currently being cared for by a friend, Michele Tyler. Her friend, Michele Tyler, asked if her dog would be able to visit. Michele Tyler became very excited by this prospect and I spoke with her nurse and that request has been approved.  Her friend, Michele Tyler, will bring her dog this afternoon/evening and will assume care for the dog throughout that time.

## 2024-08-04 DIAGNOSIS — N3080 Other cystitis without hematuria: Secondary | ICD-10-CM | POA: Diagnosis not present

## 2024-08-04 LAB — CBC WITH DIFFERENTIAL/PLATELET
Abs Immature Granulocytes: 0.06 K/uL (ref 0.00–0.07)
Basophils Absolute: 0 K/uL (ref 0.0–0.1)
Basophils Relative: 1 %
Eosinophils Absolute: 0.1 K/uL (ref 0.0–0.5)
Eosinophils Relative: 3 %
HCT: 31.4 % — ABNORMAL LOW (ref 36.0–46.0)
Hemoglobin: 10.1 g/dL — ABNORMAL LOW (ref 12.0–15.0)
Immature Granulocytes: 2 %
Lymphocytes Relative: 15 %
Lymphs Abs: 0.4 K/uL — ABNORMAL LOW (ref 0.7–4.0)
MCH: 27.2 pg (ref 26.0–34.0)
MCHC: 32.2 g/dL (ref 30.0–36.0)
MCV: 84.6 fL (ref 80.0–100.0)
Monocytes Absolute: 0.7 K/uL (ref 0.1–1.0)
Monocytes Relative: 24 %
Neutro Abs: 1.5 K/uL — ABNORMAL LOW (ref 1.7–7.7)
Neutrophils Relative %: 55 %
Platelets: 485 K/uL — ABNORMAL HIGH (ref 150–400)
RBC: 3.71 MIL/uL — ABNORMAL LOW (ref 3.87–5.11)
RDW: 14.6 % (ref 11.5–15.5)
WBC: 2.9 K/uL — ABNORMAL LOW (ref 4.0–10.5)
nRBC: 0 % (ref 0.0–0.2)

## 2024-08-04 NOTE — TOC Progression Note (Signed)
 Transition of Care [facility]) - Progression Note    Patient Details  Name: Michele Tyler MRN: [id number] Date of Birth: April 27, 1946  Transition of Care [facility]) CM/SW Contact  NORMAN ASPEN, LCSW Phone Number: 08/04/2024, 2:11 PM  Clinical Narrative:     Have reviewed SNF bed offers with pt and friend and patient has accepted bed at [facility].  Have confirmed with facility that they will be able to provide transportation for pt to her chemo infusion appointments and have provided them with schedule.  Insurance authorization begun and hopeful to have pt ready for dc to facility tomorrow.  Expected Discharge Plan: Skilled Nursing Facility Barriers to Discharge: Continued Medical Work up, English As A Second Language Teacher, SNF Pending bed offer               Expected Discharge Plan and Services In-house Referral: Clinical Social Work   Post Acute Care Choice: Skilled Nursing Facility Living arrangements for the past 2 months: Single Family Home                 DME Arranged: N/A DME Agency: NA                   Social Drivers of Health (SDOH) Interventions SDOH Screenings   Food Insecurity: No Food Insecurity (07/27/2024)  Housing: Low Risk (07/27/2024)  Transportation Needs: No Transportation Needs (07/27/2024)  Utilities: Not At Risk (07/27/2024)  Alcohol  Screen: Low Risk (08/04/2023)  Depression (PHQ2-9): High Risk (06/09/2024)  Financial Resource Strain: Low Risk (08/04/2023)  Physical Activity: Sufficiently Active (08/04/2023)  Social Connections: Unknown (07/27/2024)  Recent Concern: Social Connections - Moderately Isolated (06/22/2024)  Stress: Stress Concern Present (08/04/2023)  Tobacco Use: Medium Risk (07/28/2024)    Readmission Risk Interventions    08/02/2024    3:14 PM  Readmission Risk Prevention Plan  Transportation Screening Complete  PCP or Specialist Appt within 3-5 Days Complete  HRI or Home Care Consult Complete  Social Work Consult for Recovery  Care Planning/Counseling Complete  Palliative Care Screening Complete  Medication Review Oceanographer) Complete

## 2024-08-04 NOTE — Progress Notes (Signed)
 " PROGRESS NOTE  Michele Tyler  DOB: [birth year]  PCP: [name], MD FMW:[id number]  DOA: 07/26/2024  LOS: 9 days  Hospital Day: 10  Subjective: Patient was seen and examined this afternoon. Pleasant elderly Caucasian female.  Sitting up in recliner.  Not in distress.  Not on supplemental oxygen.  Friend at bedside. Afebrile, hemodynamically stable, breathing room air Labs from this morning with WC count better at 2.9, hemoglobin better at 10.1  Brief narrative: Michele Tyler is a 79 y.o. female with PMH significant for newly diagnosed ovarian cancer with metastasis, malignant ascites recently started on chemotherapy, h/o hypertension, stroke, anxiety, depression. She received first round of chemotherapy on 12/22.  Started to have weakness, diarrhea and abdominal pain which progressively worsened and is presented to ED on 12/29.  CT abdomen pelvis showed -Severe, diffuse emphysematous cystitis with extensive intramural gas and moderate intraluminal gas, consistent with severe infection  -Left hydronephrosis with distention of the left renal pelvis and decompressed ureter consistent with chronic UPJ obstruction -Progressive omental caking with peritoneal thickening and nodularity, consistent with peritoneal carcinomatosis. -Superimposed circumferential bowel wall thickening and edema involving the sigmoid colon, suggesting infectious or inflammatory colitis. -Large volume ascites, moderate bilateral pleural effusions   Foley catheter was inserted in the ED Urinalysis showed cloudy amber-colored urine with trace leukocytes, many bacteria Urine culture, blood culture sent Started on IV antibiotics Admitted to [facility] Hospital course as below Seen by oncology, PCCM, urology, palliative care PT recommended SNF  Assessment and plan: Severe emphysematous cystitis POA Presented with weakness after chemotherapy  Urinalysis was positive on admission.   CT scan on admission showed  severe diffuse emphysematous cystitis consistent with severe infection. Urine culture and blood culture did not show significant growth. Initially treated with IV antibiotics.   Repeat CT scan showed largely resolved emphysematous cystitis.  Subsequently switched to oral cefadroxil , EOT 1/13 Recent Labs  Lab 07/31/24 0519 08/01/24 0526 08/02/24 0436 08/03/24 0455 08/04/24 0420  WBC 1.3* 1.6* 1.9* 1.9* 2.9*   Acute urinary obstruction Chronic left UPJ obstruction Foley catheter was inserted in the ED. CT scan on admission also showed left hydronephrosis with distention of the renal pelvis and decompressed ureter consistent with chronic UPJ obstruction Seen by urology.  Acute retention likely suspected to be due to high level of pelvic stool burden. Repeat CT abdomen/pelvis on 1/5 showed stable left hydronephrosis without hydroureter.   Foley catheter was removed.  Able to void urine after that.  Sigmoid colitis Chronic constipation Fecal impaction CT scan X2 continue to show prominent wall thickening of sigmoid colon.  Likely chemotherapy induced colitis No diarrhea.  Patient actually had chronic constipation and large stool burden. Needed manual disimpaction.  Has been having good bowel movement since then. Pelvic stool burden has largely cleared.  Continue bowel regimen with Senokot nightly, MiraLAX  twice daily  Acute metabolic encephalopathy Slow to respond but oriented to place and person Currently on buspirone  7.5 mg nightly  Ovarian cancer with metastatic disease, peritoneal carcinomatosis Recently diagnosed.   Followed by Dr. Lonn, Dr. Viktoria.  Status post 1 cycle of chemotherapy 12/22.  Complicated by poor performance status, malnutrition. Continue to follow-up with oncology as an outpatient Pain regimen --- PRN: Tylenol , oxycodone  5 mg/10 mg every 4 hours  Bilateral moderate to large pleural effusion Large volume malignant ascites Respiratory status stable.   Thoracentesis would not be needed I have requested ultrasound-guided paracentesis.   Acquired pancytopenia Chemotherapy-induced neutropenia Secondary to chemotherapy.  Per oncology does not  need transfusion or G-CSF  ANC trending up Recent Labs  Lab 07/31/24 0519 08/01/24 0526 08/02/24 0436 08/03/24 0455 08/04/24 0420  WBC 1.3* 1.6* 1.9* 1.9* 2.9*  NEUTROABS  --  0.4* 0.6* 0.8* 1.5*  HGB 10.6* 10.5* 10.5* 9.7* 10.1*  HCT 32.3* 33.0* 32.5* 30.7* 31.4*  MCV 82.0 84.0 83.5 85.8 84.6  PLT 398 402* 440* 451* 485*   Hypokalemia/hypophosphatemia Potassium and phosphorus level improved with replacement.  Renal function has been normal and stable. 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 Continue Plavix , statin   Essential hypertension Currently blood pressure is controlled with Lopressor  25 mg daily   Mobility: Encourage ambulation  PT Orders: Active   PT Follow up Rec: Recommend Snf, Barriers To Snf Placement - Toc To F/U With Patient/Family For D/C Plans1/01/2025 1359    Goals of care   Code Status: Full Code     DVT prophylaxis:  heparin  injection 5,000 Units Start: 07/27/24 0600   Antimicrobials: Cefadroxil , EOT 1/13 Fluid: None Consultants: Oncology Family Communication: Friend at bedside  Status: Inpatient Level of care:  Med-Surg   Patient is from: Home Needs to continue in-hospital care: Pending paracentesis Anticipated d/c to: Hopefully rehab tomorrow      Diet:  Diet Order             Diet regular Room service appropriate? Yes; Fluid consistency: Thin  Diet effective now                   Scheduled Meds:  amLODipine   10 mg Oral Daily    atorvastatin   10 mg Oral QHS   busPIRone   7.5 mg Oral QHS   cefadroxil   1,000 mg Oral BID   Chlorhexidine  Gluconate Cloth  6 each Topical Daily   clopidogrel   75 mg Oral QPM   feeding supplement  237 mL Oral BID BM   heparin   5,000 Units Subcutaneous Q8H   metoprolol  tartrate  25 mg Oral BID   polyethylene glycol  17 g Oral BID   senna  1 tablet Oral QHS   sodium chloride  flush  10-40 mL Intracatheter Q12H    PRN meds: acetaminophen , albuterol , artificial tears, hydrALAZINE , HYDROmorphone  (DILAUDID ) injection, melatonin, oxyCODONE , prochlorperazine , sodium chloride  flush   Infusions:    Antimicrobials: Anti-infectives (From admission, onward)    Start     Dose/Rate Route Frequency Ordered Stop   08/03/24 2200  cefadroxil  (DURICEF) capsule 1,000 mg        1,000 mg Oral 2 times daily 08/03/24 1033 08/10/24 2159   08/02/24 1000  cefTRIAXone  (ROCEPHIN ) 2 g in sodium chloride  0.9 % 100 mL IVPB  Status:  Discontinued        2 g 200 mL/hr over 30 Minutes Intravenous Every 24 hours 08/02/24 0825 08/03/24 1033   07/28/24 1600  cefTRIAXone  (ROCEPHIN ) 2 g in sodium chloride  0.9 % 100 mL IVPB        2 g 200 mL/hr over 30 Minutes Intravenous Every 24 hours 07/28/24 1425 07/31/24 1612   07/27/24 0800  piperacillin -tazobactam (  ZOSYN ) IVPB 3.375 g  Status:  Discontinued        3.375 g 12.5 mL/hr over 240 Minutes Intravenous Every 8 hours 07/27/24 0157 07/28/24 1424   07/26/24 2245  vancomycin  (VANCOCIN ) IVPB 1000 mg/200 mL premix        1,000 mg 200 mL/hr over 60 Minutes Intravenous  Once 07/26/24 2233 07/27/24 0146   07/26/24 2245  piperacillin -tazobactam (ZOSYN ) IVPB 3.375 g        3.375 g 100 mL/hr over 30 Minutes Intravenous  Once 07/26/24 2233 07/27/24 0019   07/26/24 2145  cefTRIAXone  (ROCEPHIN ) 2 g in sodium chloride  0.9 % 100 mL IVPB  Status:  Discontinued        2 g 200 mL/hr over 30 Minutes Intravenous  Once 07/26/24 2144 07/26/24 2148       Objective: Vitals:   08/04/24  0952 08/04/24 1334  BP: (!) 141/82 122/76  Pulse: 92 91  Resp:  18  Temp:  97.6 F (36.4 C)  SpO2:  97%    Intake/Output Summary (Last 24 hours) at 08/04/2024 1508 Last data filed at 08/04/2024 1000 Gross per 24 hour  Intake 270 ml  Output 150 ml  Net 120 ml   Filed Weights   07/27/24 1855  Weight: 49 kg   Weight change:  Body mass index is 18.54 kg/m.   Physical Exam: General exam: Pleasant, elderly Caucasian female.  Not in distress Skin: No rashes, lesions or ulcers. HEENT: Atraumatic, normocephalic, no obvious bleeding Lungs: Clear to auscultation bilaterally, diminished air entry in both bases CVS: S1, S2, no murmur,   GI/Abd: Soft, nontender, mild to moderate distention due to ascites, bowel sound present,   CNS: Alert, awake, oriented to place and person.  Slow to respond Psychiatry: Mood appropriate Extremities: No pedal edema, no calf tenderness,   Data Review: I have personally reviewed the laboratory data and studies available.  F/u labs ordered Unresulted Labs (From admission, onward)    None       Signed, Chapman Rota, MD [facility] 08/04/2024  "

## 2024-08-04 NOTE — Progress Notes (Signed)
 Physical Therapy Treatment Patient Details Name: Michele Tyler MRN: [id number] DOB: 16-Jul-1946 Today's Date: 08/04/2024   History of Present Illness 79 yo female admitted with severe emphysematous cystitis. Hx of CVA 04/2024, ovarian cancer with mets, depression, anxiety, constipation    PT Comments  Pt eager to walk.  Able to increase gait distance but needs rest breaks and fatigues easily.  Pt needing mod/max cues for initiation, sequencing, and RW use.  Cont POC. Patient will benefit from continued inpatient follow up therapy, <3 hours/day at d/c    If plan is discharge home, recommend the following: A lot of help with walking and/or transfers;A lot of help with bathing/dressing/bathroom;Assistance with cooking/housework;Assist for transportation;Help with stairs or ramp for entrance   Can travel by private vehicle        Equipment Recommendations  None recommended by PT    Recommendations for Other Services       Precautions / Restrictions Precautions Precautions: Fall     Mobility  Bed Mobility               General bed mobility comments: in chair    Transfers Overall transfer level: Needs assistance Equipment used: Rolling walker (2 wheels) Transfers: Sit to/from Stand Sit to Stand: Min assist           General transfer comment: Cues for hand placement; max cues to initate, STS x 5 during session;  Pt had feminen hygiene pad in place that was soiled.  Walked to bathroom, max A for ADLs    Ambulation/Gait Ambulation/Gait assistance: Min assist Gait Distance (Feet): 40 Feet (10'x2, 40'x2) Assistive device: Rolling walker (2 wheels) Gait Pattern/deviations: Step-to pattern, Decreased stride length, Trunk flexed Gait velocity: decreased     General Gait Details: Walked to from bathroom with seated rest breaks, then hallway ambulation 40'x2 with standing rest break; min A balance and RW management; max cues for RW   Stairs              Wheelchair Mobility     Tilt Bed    Modified Rankin (Stroke Patients Only)       Balance Overall balance assessment: Needs assistance Sitting-balance support: Feet supported Sitting balance-Leahy Scale: Fair Sitting balance - Comments: Fatigues easily and will lean back in chair - pulled up to sit on handrest 5 x   Standing balance support: Bilateral upper extremity supported, During functional activity, Reliant on assistive device for balance Standing balance-Leahy Scale: Poor                              Communication Communication Communication: No apparent difficulties  Cognition Arousal: Alert Behavior During Therapy: Flat affect   PT - Cognitive impairments: History of cognitive impairments, Safety/Judgement, Problem solving, Initiation, Sequencing, Orientation                       PT - Cognition Comments: Max cues for sequencing, safety, initiation Following commands: Impaired Following commands impaired: Follows one step commands with increased time, Follows one step commands inconsistently    Cueing Cueing Techniques: Verbal cues, Gestural cues, Tactile cues, Visual cues  Exercises General Exercises - Lower Extremity Ankle Circles/Pumps: AROM, Both, 10 reps, Seated Long Arc Quad: AROM, Both, 10 reps, Seated    General Comments        Pertinent Vitals/Pain Pain Assessment Pain Assessment: No/denies pain    Home Living  Prior Function            PT Goals (current goals can now be found in the care plan section) Progress towards PT goals: Progressing toward goals    Frequency    Min 2X/week      PT Plan      Co-evaluation              AM-PAC PT 6 Clicks Mobility   Outcome Measure  Help needed turning from your back to your side while in a flat bed without using bedrails?: A Lot (min A but mod/max cues) Help needed moving from lying on your back to sitting on the side of  a flat bed without using bedrails?: A Lot Help needed moving to and from a bed to a chair (including a wheelchair)?: A Lot Help needed standing up from a chair using your arms (e.g., wheelchair or bedside chair)?: A Lot Help needed to walk in hospital room?: A Lot Help needed climbing 3-5 steps with a railing? : Total 6 Click Score: 11    End of Session Equipment Utilized During Treatment: Gait belt Activity Tolerance: Patient tolerated treatment well Patient left: with call bell/phone within reach;in chair;with chair alarm set;with family/visitor present Nurse Communication: Mobility status PT Visit Diagnosis: Muscle weakness (generalized) (M62.81);Difficulty in walking, not elsewhere classified (R26.2);Unsteadiness on feet (R26.81)     Time: 8768-8744 PT Time Calculation (min) (ACUTE ONLY): 24 min  Charges:    $Gait Training: 8-22 mins $Therapeutic Exercise: 8-22 mins PT General Charges $$ ACUTE PT VISIT: 1 Visit                     Benjiman, PT Acute Rehab Services  Rehab [phone]    Benjiman VEAR Mulberry 08/04/2024, 2:00 PM

## 2024-08-04 NOTE — Progress Notes (Signed)
 Occupational Therapy Treatment Patient Details Name: Michele Tyler MRN: [id number] DOB: October 13, 1945 Today's Date: 08/04/2024   History of present illness 79 yo female admitted with severe emphysematous cystitis. Hx of CVA 04/2024, ovarian cancer with mets, depression, anxiety, constipation   OT comments  Patient seen in order to increase overall activity tolerance and functional mobility. Patient wanting to walk, however solied upon arrival and requiring total A for toileting. Patient requiring step by step multi-modal cues to complete all tasks, often requiring significant time and OT intervention. Patient with posterior lean each time in standing, but able to correct with cues and increased time. Patient with no ability to manage RW independently, with OT having to assist to be able to ambulate in hallway. OT recommendation remains approrpiate at this time, will continue to follow acutely.       If plan is discharge home, recommend the following:  A little help with walking and/or transfers;A lot of help with bathing/dressing/bathroom;Assistance with cooking/housework;Direct supervision/assist for medications management;Direct supervision/assist for financial management;Assist for transportation;Help with stairs or ramp for entrance;Supervision due to cognitive status   Equipment Recommendations  Other (comment) (defer to next venue)    Recommendations for Other Services      Precautions / Restrictions Precautions Precautions: Fall Recall of Precautions/Restrictions: Impaired Restrictions Weight Bearing Restrictions Per Provider Order: No       Mobility Bed Mobility Overal bed mobility: Needs Assistance Bed Mobility: Supine to Sit     Supine to sit: Min assist, HOB elevated     General bed mobility comments: Increased time to complete. Multimodel cueing provided to initiate and maintain attention to task    Transfers Overall transfer level: Needs assistance Equipment  used: Rolling walker (2 wheels) Transfers: Sit to/from Stand Sit to Stand: Min assist           General transfer comment: min cues, posterior lean each time in standing, unable to carryover cues from each sit<>stand, required OT to place hands on RW and steer RW in hallway     Balance Overall balance assessment: Needs assistance Sitting-balance support: Feet supported Sitting balance-Leahy Scale: Fair     Standing balance support: Bilateral upper extremity supported, During functional activity, Reliant on assistive device for balance Standing balance-Leahy Scale: Poor Standing balance comment: reliant on RW and OT                           ADL either performed or assessed with clinical judgement   ADL Overall ADL's : Needs assistance/impaired     Grooming: Wash/dry face;Wash/dry hands;Set up;Sitting       Lower Body Bathing: Total assistance;Sitting/lateral leans;Sit to/from stand       Lower Body Dressing: Total assistance;Sitting/lateral leans;Sit to/from stand   Toilet Transfer: Minimal assistance;Stand-pivot;BSC/3in1   Toileting- Clothing Manipulation and Hygiene: Total assistance;Sit to/from stand;Sitting/lateral lean         General ADL Comments: Patient seen in order to increase overall activity tolerance and functional mobility. Patient wanting to walk, however solied upon arrival and requiring total A for toileting. Patient requiring step by step multi-modal cues to complete all tasks, often requiring significant time and OT intervention. Patient with posterior lean each time in standing, but able to correct with cues and increased time. Patient with no ability to manage RW independently, with OT having to assist to be able to ambulate in hallway. OT recommendation remains approrpiate at this time, will continue to follow acutely.    Extremity/Trunk  Assessment              Vision       Perception     Praxis     Communication  Communication Communication: No apparent difficulties   Cognition Arousal: Alert Behavior During Therapy: Flat affect Cognition: Cognition impaired, History of cognitive impairments   Orientation impairments: Place, Time, Situation Awareness: Intellectual awareness impaired, Online awareness impaired Memory impairment (select all impairments): Short-term memory, Working memory, Non-declarative long-term memory, Geneticist, Molecular long-term memory Attention impairment (select first level of impairment): Focused attention Executive functioning impairment (select all impairments): Initiation, Organization, Sequencing, Reasoning, Problem solving OT - Cognition Comments: Soiled upon arrival, no awareness, requires step by step cues and increased time, patient with significant cognitive decline since this OT saw patient on 10/25                 Following commands: Impaired Following commands impaired: Follows one step commands with increased time      Cueing   Cueing Techniques: Verbal cues, Gestural cues, Tactile cues, Visual cues  Exercises      Shoulder Instructions       General Comments      Pertinent Vitals/ Pain       Pain Assessment Pain Assessment: No/denies pain  Home Living                                          Prior Functioning/Environment              Frequency  Min 2X/week        Progress Toward Goals  OT Goals(current goals can now be found in the care plan section)  Progress towards OT goals: Progressing toward goals  Acute Rehab OT Goals Patient Stated Goal: to walk OT Goal Formulation: With patient Time For Goal Achievement: 08/12/24 Potential to Achieve Goals: Fair  Plan      Co-evaluation                 AM-PAC OT 6 Clicks Daily Activity     Outcome Measure   Help from another person eating meals?: A Little Help from another person taking care of personal grooming?: A Little Help from another person  toileting, which includes using toliet, bedpan, or urinal?: Total Help from another person bathing (including washing, rinsing, drying)?: A Lot Help from another person to put on and taking off regular upper body clothing?: A Little Help from another person to put on and taking off regular lower body clothing?: Total 6 Click Score: 13    End of Session Equipment Utilized During Treatment: Gait belt;Rolling walker (2 wheels)  OT Visit Diagnosis: Muscle weakness (generalized) (M62.81)   Activity Tolerance Patient tolerated treatment well   Patient Left in chair;with call bell/phone within reach;with chair alarm set;with family/visitor present   Nurse Communication Mobility status        Time: 8953-8883 OT Time Calculation (min): 30 min  Charges: OT General Charges $OT Visit: 1 Visit OT Treatments $Self Care/Home Management : 23-37 mins  Ronal Gift E. Reba Hulett, OTR/L Acute Rehabilitation Services [phone]   Ronal Gift Salt 08/04/2024, 11:49 AM

## 2024-08-05 ENCOUNTER — Inpatient Hospital Stay (HOSPITAL_COMMUNITY)

## 2024-08-05 ENCOUNTER — Other Ambulatory Visit: Payer: Self-pay | Admitting: Hematology and Oncology

## 2024-08-05 DIAGNOSIS — N3080 Other cystitis without hematuria: Secondary | ICD-10-CM | POA: Diagnosis not present

## 2024-08-05 MED ORDER — AMLODIPINE BESYLATE 10 MG PO TABS: 10.0000 mg | ORAL_TABLET | Freq: Every day | ORAL | Status: AC

## 2024-08-05 MED ORDER — METOPROLOL TARTRATE 25 MG PO TABS: 25.0000 mg | ORAL_TABLET | Freq: Two times a day (BID) | ORAL | Status: AC

## 2024-08-05 MED ORDER — OXYCODONE HCL 5 MG PO TABS: 5.0000 mg | ORAL_TABLET | ORAL | 0 refills | Status: AC | PRN

## 2024-08-05 MED ORDER — ACETAMINOPHEN 500 MG PO TABS: 500.0000 mg | ORAL_TABLET | Freq: Four times a day (QID) | ORAL | Status: AC | PRN

## 2024-08-05 MED ORDER — ALBUTEROL SULFATE (2.5 MG/3ML) 0.083% IN NEBU: 2.5000 mg | INHALATION_SOLUTION | RESPIRATORY_TRACT | Status: AC | PRN

## 2024-08-05 MED ORDER — LIDOCAINE-EPINEPHRINE 1 %-1:100000 IJ SOLN
INTRAMUSCULAR | Status: AC
  Filled 2024-08-05: qty 20

## 2024-08-05 MED ORDER — CEFADROXIL 500 MG PO CAPS: 1000.0000 mg | ORAL_CAPSULE | Freq: Two times a day (BID) | ORAL | Status: AC

## 2024-08-05 MED ORDER — MELATONIN 5 MG PO TABS: 5.0000 mg | ORAL_TABLET | Freq: Every evening | ORAL | Status: AC | PRN

## 2024-08-05 MED ORDER — SENNA 8.6 MG PO TABS: 1.0000 | ORAL_TABLET | Freq: Every day | ORAL | Status: AC

## 2024-08-05 MED ORDER — ENSURE PLUS HIGH PROTEIN PO LIQD: 237.0000 mL | Freq: Two times a day (BID) | ORAL | Status: AC

## 2024-08-05 MED ORDER — POLYETHYLENE GLYCOL 3350 17 G PO PACK: 17.0000 g | PACK | Freq: Two times a day (BID) | ORAL | Status: AC

## 2024-08-05 NOTE — TOC Progression Note (Signed)
 Transition of Care [facility]) - Progression Note    Patient Details  Name: Michele Tyler MRN: [id number] Date of Birth: 15-Dec-1945  Transition of Care [facility]) CM/SW Contact  NORMAN ASPEN, LCSW Phone Number: 08/05/2024, 2:31 PM  Clinical Narrative:     Alerted by [facility] that bed will now not be ready until tomorrow for patient's admission.  MD/patient/ friends/ RN aware.  Expected Discharge Plan: Skilled Nursing Facility Barriers to Discharge: Continued Medical Work up, English As A Second Language Teacher, SNF Pending bed offer               Expected Discharge Plan and Services In-house Referral: Clinical Social Work   Post Acute Care Choice: Skilled Nursing Facility Living arrangements for the past 2 months: Single Family Home                 DME Arranged: N/A DME Agency: NA                   Social Drivers of Health (SDOH) Interventions SDOH Screenings   Food Insecurity: No Food Insecurity (07/27/2024)  Housing: Low Risk (07/27/2024)  Transportation Needs: No Transportation Needs (07/27/2024)  Utilities: Not At Risk (07/27/2024)  Alcohol  Screen: Low Risk (08/04/2023)  Depression (PHQ2-9): High Risk (06/09/2024)  Financial Resource Strain: Low Risk (08/04/2023)  Physical Activity: Sufficiently Active (08/04/2023)  Social Connections: Unknown (07/27/2024)  Recent Concern: Social Connections - Moderately Isolated (06/22/2024)  Stress: Stress Concern Present (08/04/2023)  Tobacco Use: Medium Risk (07/28/2024)    Readmission Risk Interventions    08/02/2024    3:14 PM  Readmission Risk Prevention Plan  Transportation Screening Complete  PCP or Specialist Appt within 3-5 Days Complete  HRI or Home Care Consult Complete  Social Work Consult for Recovery Care Planning/Counseling Complete  Palliative Care Screening Complete  Medication Review Oceanographer) Complete

## 2024-08-05 NOTE — Progress Notes (Signed)
 Michele Tyler   DOB:1946/01/06   FM#:[id number]    ASSESSMENT & PLAN:  Ovarian cancer She had received cycle 1 of treatment, complicated by admission, poor performance status, malnutrition and possible infection Overall improving since admission However, due to her poor performance status, she is not ready to resume chemotherapy Her outpatient treatment is canceled I recommend Michele Tyler to contact me for future update, she can resume chemotherapy after discharge from skilled nursing facility  Acquired pancytopenia Due to recent treatment Overall improving slowly Observe closely  Bilateral pleural effusion She is not symptomatic She does not need thoracentesis right now  Malignant ascites She is not symptomatic  Severe chronic constipation She has fecal impaction, status post disimpaction by nursing staff Continue laxatives  Discharge planning We have extensive discussions about plan of care after discharge from the hospital She is currently requiring 24-hour nursing care and caregivers are not able to provide that Recommend consideration for skilled nursing facility placement Her caregiver, Michele Tyler will update me once she is discharged from skilled nursing facility, we can resume outpatient treatment  Almarie Bedford, MD 08/05/2024 12:46 PM  Subjective:  The patient was eating breakfast, sitting up.  She does not recognize me.  She does not appears to be confused.  I discussed plan of care with her caregiver, Michele Tyler over the phone  Objective:  Vitals:   08/04/24 2134 08/05/24 0456  BP: 134/74 134/86  Pulse: 89 81  Resp: 18 18  Temp: 97.9 F (36.6 C) (!) 97.4 F (36.3 C)  SpO2: 96% 95%     Intake/Output Summary (Last 24 hours) at 08/05/2024 1246 Last data filed at 08/05/2024 1000 Gross per 24 hour  Intake 720 ml  Output --  Net 720 ml

## 2024-08-05 NOTE — Progress Notes (Signed)
 Mobility Specialist Progress Note:   08/05/24 1340  Mobility  Activity Ambulated with assistance  Level of Assistance Minimal assist, patient does 75% or more  Assistive Device Front wheel walker  Distance Ambulated (ft) 160 ft  Activity Response Tolerated well  Mobility Referral Yes  Mobility visit 1 Mobility  Mobility Specialist Start Time (ACUTE ONLY) 1218  Mobility Specialist Stop Time (ACUTE ONLY) 1233  Mobility Specialist Time Calculation (min) (ACUTE ONLY) 15 min   Pt was received in bed and agreed to mobility. Min A sit to stand. Instructed Pt on safety cues with RW during ambulation. Returned to bed with all needs met. Call bell in reach. Left in room with family.   [employer] - Mobility Specialist

## 2024-08-05 NOTE — Progress Notes (Addendum)
 " PROGRESS NOTE  Michele Tyler  DOB: [birth year]  PCP: [name], MD FMW:[id number]  DOA: 07/26/2024  LOS: 10 days  Hospital Day: 11  Subjective: Patient was seen and examined this morning. Lying on bed.  No acute distress. Partner at bedside Afebrile, hemodynamically stable, breathing on room air.  Brief narrative: Michele Tyler is a 79 y.o. female with PMH significant for newly diagnosed ovarian cancer with metastasis, malignant ascites recently started on chemotherapy, h/o hypertension, stroke, anxiety, depression. She received first round of chemotherapy on 12/22.  Started to have weakness, diarrhea and abdominal pain which progressively worsened and is presented to ED on 12/29.  CT abdomen pelvis showed -Severe, diffuse emphysematous cystitis with extensive intramural gas and moderate intraluminal gas, consistent with severe infection  -Left hydronephrosis with distention of the left renal pelvis and decompressed ureter consistent with chronic UPJ obstruction -Progressive omental caking with peritoneal thickening and nodularity, consistent with peritoneal carcinomatosis. -Superimposed circumferential bowel wall thickening and edema involving the sigmoid colon, suggesting infectious or inflammatory colitis. -Large volume ascites, moderate bilateral pleural effusions   Foley catheter was inserted in the ED Urinalysis showed cloudy amber-colored urine with trace leukocytes, many bacteria Urine culture, blood culture sent Started on IV antibiotics Admitted to [facility] Hospital course as below Seen by oncology, PCCM, urology, palliative care PT recommended SNF  Assessment and plan: Severe emphysematous cystitis POA Presented with weakness after chemotherapy  Urinalysis was positive on admission.   CT scan on admission showed severe diffuse emphysematous cystitis consistent with severe infection. Urine culture and blood culture did not show significant growth. Initially  treated with IV antibiotics.   Repeat CT scan showed largely resolved emphysematous cystitis.  Subsequently switched to oral cefadroxil , EOT 1/13 Recent Labs  Lab 07/31/24 0519 08/01/24 0526 08/02/24 0436 08/03/24 0455 08/04/24 0420  WBC 1.3* 1.6* 1.9* 1.9* 2.9*   Acute urinary obstruction Chronic left UPJ obstruction Foley catheter was inserted in the ED. CT scan on admission also showed left hydronephrosis with distention of the renal pelvis and decompressed ureter consistent with chronic UPJ obstruction Seen by urology.  Acute retention likely suspected to be due to high level of pelvic stool burden. Repeat CT abdomen/pelvis on 1/5 showed stable left hydronephrosis without hydroureter.   Foley catheter was removed.  Able to void urine after that.  Sigmoid colitis Chronic constipation Fecal impaction CT scan X2 continue to show prominent wall thickening of sigmoid colon.  Likely chemotherapy induced colitis No diarrhea.  Patient actually had chronic constipation and large stool burden. Needed manual disimpaction.  Has been having good bowel movement since then. Pelvic stool burden has largely cleared.  Continue bowel regimen with Senokot nightly, MiraLAX  twice daily  Acute metabolic encephalopathy Slow to respond but oriented to place and person Currently on buspirone  7.5 mg nightly  Ovarian cancer with metastatic disease, peritoneal carcinomatosis Recently diagnosed.   Followed by Dr. Lonn, Dr. Viktoria.  Status post 1 cycle of chemotherapy 12/22.  Complicated by poor performance status, malnutrition. Continue to follow-up with oncology as an outpatient Pain regimen --- PRN: Tylenol , oxycodone  5 mg/10 mg every 4 hours  Bilateral moderate to large pleural effusion Large volume malignant ascites Respiratory status stable.  Thoracentesis would not be needed 1/8, ultrasound did not show any large pocket of fluid to be tapped.   Acquired pancytopenia Chemotherapy-induced  neutropenia Secondary to chemotherapy.  Per oncology does not need transfusion or G-CSF  ANC trending up Recent Labs  Lab 07/31/24 0519 08/01/24 0526 08/02/24  [id number] 08/03/24 0455 08/04/24 0420  WBC 1.3* 1.6* 1.9* 1.9* 2.9*  NEUTROABS  --  0.4* 0.6* 0.8* 1.5*  HGB 10.6* 10.5* 10.5* 9.7* 10.1*  HCT 32.3* 33.0* 32.5* 30.7* 31.4*  MCV 82.0 84.0 83.5 85.8 84.6  PLT 398 402* 440* 451* 485*   Hypokalemia/hypophosphatemia Potassium and phosphorus level improved with replacement.  Renal function has been normal and stable. 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 Continue Plavix , statin   Essential hypertension Currently blood pressure is controlled with Lopressor  25 mg daily, amlodipine  10 mg daily  Mobility: Encourage ambulation  PT Orders: Active   PT Follow up Rec: Recommend Snf, Barriers To Snf Placement - Toc To F/U With Patient/Family For D/C Plans1/01/2025 1359    Goals of care   Code Status: Full Code     DVT prophylaxis:  heparin  injection 5,000 Units Start: 07/27/24 0600   Antimicrobials: Cefadroxil , EOT 1/13 Fluid: None Consultants: Oncology Family Communication: Friend at bedside  Status: Inpatient Level of care:  Med-Surg   Patient is from: Home Needs to continue in-hospital care: Stable for discharge to rehab.  Per child psychotherapist, rehab bed to be available tomorrow     Diet:  Diet Order             Diet regular Room service appropriate? Yes; Fluid consistency: Thin  Diet effective now                   Scheduled Meds:  amLODipine   10 mg Oral Daily   atorvastatin   10 mg Oral QHS   busPIRone   7.5 mg Oral QHS   cefadroxil   1,000 mg Oral BID   Chlorhexidine  Gluconate Cloth  6 each Topical Daily    clopidogrel   75 mg Oral QPM   feeding supplement  237 mL Oral BID BM   heparin   5,000 Units Subcutaneous Q8H   metoprolol  tartrate  25 mg Oral BID   polyethylene glycol  17 g Oral BID   senna  1 tablet Oral QHS   sodium chloride  flush  10-40 mL Intracatheter Q12H    PRN meds: acetaminophen , albuterol , artificial tears, hydrALAZINE , HYDROmorphone  (DILAUDID ) injection, melatonin, oxyCODONE , prochlorperazine , sodium chloride  flush   Infusions:    Antimicrobials: Anti-infectives (From admission, onward)    Start     Dose/Rate Route Frequency Ordered Stop   08/05/24 0000  cefadroxil  (DURICEF) 500 MG capsule        1,000 mg Oral 2 times daily 08/05/24 0846 08/10/24 2359   08/03/24 2200  cefadroxil  (DURICEF) capsule 1,000 mg        1,000 mg Oral 2 times daily 08/03/24 1033 08/10/24 2159   08/02/24 1000  cefTRIAXone  (ROCEPHIN ) 2 g in sodium chloride  0.9 % 100 mL IVPB  Status:  Discontinued        2 g 200 mL/hr over 30 Minutes Intravenous Every 24 hours 08/02/24 0825 08/03/24 1033   07/28/24 1600  cefTRIAXone  (ROCEPHIN ) 2 g in sodium chloride  0.9 % 100 mL IVPB        2 g 200 mL/hr over 30 Minutes Intravenous Every 24 hours 07/28/24 1425 07/31/24 1612   07/27/24  0800  piperacillin -tazobactam (ZOSYN ) IVPB 3.375 g  Status:  Discontinued        3.375 g 12.5 mL/hr over 240 Minutes Intravenous Every 8 hours 07/27/24 0157 07/28/24 1424   07/26/24 2245  vancomycin  (VANCOCIN ) IVPB 1000 mg/200 mL premix        1,000 mg 200 mL/hr over 60 Minutes Intravenous  Once 07/26/24 2233 07/27/24 0146   07/26/24 2245  piperacillin -tazobactam (ZOSYN ) IVPB 3.375 g        3.375 g 100 mL/hr over 30 Minutes Intravenous  Once 07/26/24 2233 07/27/24 0019   07/26/24 2145  cefTRIAXone  (ROCEPHIN ) 2 g in sodium chloride  0.9 % 100 mL IVPB  Status:  Discontinued        2 g 200 mL/hr over 30 Minutes Intravenous  Once 07/26/24 2144 07/26/24 2148       Objective: Vitals:   08/05/24 0456 08/05/24 1342  BP: 134/86  126/76  Pulse: 81 93  Resp: 18 18  Temp: (!) 97.4 F (36.3 C) 98.2 F (36.8 C)  SpO2: 95% 94%    Intake/Output Summary (Last 24 hours) at 08/05/2024 1350 Last data filed at 08/05/2024 1000 Gross per 24 hour  Intake 720 ml  Output --  Net 720 ml   Filed Weights   07/27/24 1855  Weight: 49 kg   Weight change:  Body mass index is 18.54 kg/m.   Physical Exam: General exam: Pleasant, elderly Caucasian female.  Not in distress Skin: No rashes, lesions or ulcers. HEENT: Atraumatic, normocephalic, no obvious bleeding Lungs: Clear to auscultation bilaterally, diminished air entry in both bases CVS: S1, S2, no murmur,   GI/Abd: Soft, nontender, bowel sound present,   CNS: Alert, awake, oriented to place and person.  Slow to respond Psychiatry: Mood appropriate Extremities: No pedal edema, no calf tenderness,   Data Review: I have personally reviewed the laboratory data and studies available.  F/u labs ordered Unresulted Labs (From admission, onward)    None       Signed, Chapman Rota, MD [facility] 08/05/2024  "

## 2024-08-05 NOTE — Progress Notes (Signed)
 Mobility Specialist Progress Note:   08/05/24 1554  Mobility  Activity  (Bed Exercises)  Level of Assistance Independent  Range of Motion/Exercises Active  Activity Response Tolerated well  Mobility Referral Yes  Mobility visit 1 Mobility  Mobility Specialist Start Time (ACUTE ONLY) 1513  Mobility Specialist Stop Time (ACUTE ONLY) 1528  Mobility Specialist Time Calculation (min) (ACUTE ONLY) 15 min   Pt was received in bed and agreed to mobility. Pt stated fatigue, opted for bed exercises: Seated BLE Exercises: 10 reps each  1) Toe Raise/ Heel Raise  2) Ankle Pumps  3) Marches  4) Hip Adduction (pillow squeezes)   Pt had no complaints/issues during session. Returned to bed with all needs met. Call bell in reach.  [employer] - Mobility Specialist

## 2024-08-05 NOTE — Progress Notes (Signed)
 PROCEDURE SUMMARY:  Patient transported to IR for therapeutic only paracentesis. Last paracentesis 07/12/24 with 900 mL output.   Pre-procedure limited abd US  performed. No significant fluid seen right abd. Small volume of ascites noted left abdomen. Rolled patient in effort to increase fluid window. Repeat exam showed no safe window for puncture due to bowel presence. No paracentesis performed.   Sadeen Wiegel NP 08/05/2024 10:12 AM

## 2024-08-06 DIAGNOSIS — N3080 Other cystitis without hematuria: Secondary | ICD-10-CM | POA: Diagnosis not present

## 2024-08-06 LAB — CBC
HCT: 32.3 % — ABNORMAL LOW (ref 36.0–46.0)
Hemoglobin: 10.3 g/dL — ABNORMAL LOW (ref 12.0–15.0)
MCH: 27.2 pg (ref 26.0–34.0)
MCHC: 31.9 g/dL (ref 30.0–36.0)
MCV: 85.4 fL (ref 80.0–100.0)
Platelets: 585 K/uL — ABNORMAL HIGH (ref 150–400)
RBC: 3.78 MIL/uL — ABNORMAL LOW (ref 3.87–5.11)
RDW: 15.1 % (ref 11.5–15.5)
WBC: 4.5 K/uL (ref 4.0–10.5)
nRBC: 0 % (ref 0.0–0.2)

## 2024-08-06 LAB — BASIC METABOLIC PANEL WITH GFR
Anion gap: 8 (ref 5–15)
BUN: 17 mg/dL (ref 8–23)
CO2: 26 mmol/L (ref 22–32)
Calcium: 8.7 mg/dL — ABNORMAL LOW (ref 8.9–10.3)
Chloride: 105 mmol/L (ref 98–111)
Creatinine, Ser: 0.47 mg/dL (ref 0.44–1.00)
GFR, Estimated: >60 mL/min
Glucose, Bld: 89 mg/dL (ref 70–99)
Potassium: 4 mmol/L (ref 3.5–5.1)
Sodium: 139 mmol/L (ref 135–145)

## 2024-08-06 MED ORDER — ONDANSETRON HCL 4 MG PO TABS
4.0000 mg | ORAL_TABLET | Freq: Once | ORAL | Status: AC
  Administered 2024-08-06: 4 mg via ORAL
  Filled 2024-08-06: qty 1

## 2024-08-06 MED ORDER — HEPARIN SOD (PORK) LOCK FLUSH 100 UNIT/ML IV SOLN
500.0000 [IU] | INTRAVENOUS | Status: AC | PRN
  Administered 2024-08-06: 500 [IU]
  Filled 2024-08-06: qty 5

## 2024-08-06 NOTE — Discharge Summary (Signed)
 "  Physician Discharge Summary  Michele Tyler FMW:[id number] DOB: Dec 10, 1945 DOA: 07/26/2024  PCP: [name], MD  Admit date: 07/26/2024 Discharge date: 08/06/2024  Admitted from: Home Discharge disposition: SNF  Recommendations at discharge:  Complete the course of antibiotics with cefadroxil , EOT 1/13 Follow-up with urology as an outpatient Continue to follow-up with oncology as an outpatient for chemotherapy plan   Subjective: Patient was seen and examined this morning. Propped up in bed.  Not in distress.  Family at bedside. Afebrile, hemodynamically stable, breathing room air Ready for discharge home  Brief narrative: Michele Tyler is a 79 y.o. female with PMH significant for newly diagnosed ovarian cancer with metastasis, malignant ascites recently started on chemotherapy, h/o hypertension, stroke, anxiety, depression. She received first round of chemotherapy on 12/22.  Started to have weakness, diarrhea and abdominal pain which progressively worsened and is presented to ED on 12/29.  CT abdomen pelvis showed -Severe, diffuse emphysematous cystitis with extensive intramural gas and moderate intraluminal gas, consistent with severe infection  -Left hydronephrosis with distention of the left renal pelvis and decompressed ureter consistent with chronic UPJ obstruction -Progressive omental caking with peritoneal thickening and nodularity, consistent with peritoneal carcinomatosis. -Superimposed circumferential bowel wall thickening and edema involving the sigmoid colon, suggesting infectious or inflammatory colitis. -Large volume ascites, moderate bilateral pleural effusions   Foley catheter was inserted in the ED Urinalysis showed cloudy amber-colored urine with trace leukocytes, many bacteria Urine culture, blood culture sent Started on IV antibiotics Admitted to [facility] Hospital course as below Seen by oncology, PCCM, urology, palliative care PT recommended  SNF  Hospital course: Severe emphysematous cystitis POA Presented with weakness after chemotherapy  Urinalysis was positive on admission.   CT scan on admission showed severe diffuse emphysematous cystitis consistent with severe infection. Urine culture and blood culture did not show significant growth. Initially treated with IV antibiotics.   Repeat CT scan showed largely resolved emphysematous cystitis.  Subsequently switched to oral cefadroxil , EOT 1/13 Recent Labs  Lab 07/31/24 0519 08/01/24 0526 08/02/24 0436 08/03/24 0455 08/04/24 0420  WBC 1.3* 1.6* 1.9* 1.9* 2.9*   Acute urinary obstruction Chronic left UPJ obstruction Foley catheter was inserted in the ED. CT scan on admission also showed left hydronephrosis with distention of the renal pelvis and decompressed ureter consistent with chronic UPJ obstruction Seen by urology.  Acute retention likely suspected to be due to high level of pelvic stool burden. Repeat CT abdomen/pelvis on 1/5 showed stable left hydronephrosis without hydroureter.   Foley catheter was removed.  Patient has been able to void urine after that.  Sigmoid colitis Chronic constipation Fecal impaction CT scan X2 continue to show prominent wall thickening of sigmoid colon.  Likely chemotherapy induced colitis No diarrhea.  Patient actually had chronic constipation and large stool burden. Needed manual disimpaction.  Has been having good bowel movement since then. Pelvic stool burden has largely cleared.  Continue bowel regimen with Senokot nightly, MiraLAX  twice daily  Acute metabolic encephalopathy Slow to respond but oriented to place and person Currently on buspirone  7.5 mg nightly  Ovarian cancer with metastatic disease, peritoneal carcinomatosis Recently diagnosed.   Followed by Dr. Lonn, Dr. Viktoria.  Status post 1 cycle of chemotherapy 12/22.  Complicated by poor performance status, malnutrition. Continue to follow-up with oncology as an  outpatient Pain regimen --- PRN: Tylenol , oxycodone  5 mg/10 mg every 4 hours  Bilateral moderate to large pleural effusion Large volume malignant ascites Respiratory status stable. Thoracentesis would not be needed.  1/8, ultrasound did not show any large pocket of fluid to be tapped.   Acquired pancytopenia Chemotherapy-induced neutropenia Secondary to chemotherapy.  Per oncology does not need transfusion or G-CSF  ANC trending up. Recent Labs  Lab 07/31/24 0519 08/01/24 0526 08/02/24 0436 08/03/24 0455 08/04/24 0420  WBC 1.3* 1.6* 1.9* 1.9* 2.9*  NEUTROABS  --  0.4* 0.6* 0.8* 1.5*  HGB 10.6* 10.5* 10.5* 9.7* 10.1*  HCT 32.3* 33.0* 32.5* 30.7* 31.4*  MCV 82.0 84.0 83.5 85.8 84.6  PLT 398 402* 440* 451* 485*   Hypokalemia/hypophosphatemia Potassium and phosphorus level improved with replacement.  Renal function has been normal and stable. 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 Yes; Fluid consistency: Thin  Diet effective now                   Nutritional status:  Body mass index is 18.54 kg/m.       Wounds:  - Wound 07/27/24 1837 Pressure Injury Sacrum Mid Stage 1 -  Intact skin with non-blanchable redness of a localized area usually over a bony prominence. (Active)  Date First Assessed/Time  First Assessed: 07/27/24 1837   Present on Original Admission: Yes  Primary Wound Type: Pressure Injury  Location: Sacrum  Location Orientation: Mid  Staging: Stage 1 -  Intact skin with non-blanchable redness of a localized a...    Assessments 07/27/2024  4:12 PM 08/06/2024  9:44 AM  Site / Wound Assessment Clean;Dry Dressing in place / Unable to assess  Drainage Amount None None  Dressing Type Foam - Lift dressing to assess site every shift Foam - Lift dressing to assess site every shift  Dressing Status Clean, Dry, Intact Clean, Dry, Intact     No associated orders.    Discharge Medications:   Allergies as of 08/06/2024       Reactions   Codeine Nausea Only        Medication List     STOP taking these medications    lactulose  10 GM/15ML solution Commonly known as: CHRONULAC    mirtazapine  7.5 MG tablet Commonly known as: REMERON    ondansetron  4 MG tablet Commonly known as: ZOFRAN    ondansetron  8 MG tablet Commonly known as: Zofran    polyethylene glycol powder 17 GM/SCOOP powder Commonly known as: GLYCOLAX /MIRALAX  Replaced by: polyethylene glycol 17 g packet   spironolactone  100 MG tablet Commonly known as: Aldactone        TAKE these medications    acetaminophen  500 MG tablet Commonly known as: TYLENOL  Take 1 tablet (500 mg total) by mouth every 6 (six) hours  as needed for mild pain (pain score 1-3), fever or headache.   albuterol  (2.5 MG/3ML) 0.083% nebulizer solution Commonly known as: PROVENTIL  Take 3 mLs (2.5 mg total) by nebulization every 2 (two) hours as needed for wheezing or shortness of breath.   amLODipine  10 MG tablet Commonly known as: NORVASC  Take 1 tablet (10 mg total) by mouth daily.   artificial tears ophthalmic solution Place 1 drop into both eyes as needed for dry eyes.   atorvastatin  10 MG tablet Commonly known as: LIPITOR TAKE 1 TABLET BY MOUTH AT  BEDTIME   B-12 1000 MCG Caps Take 1,000 mcg by mouth in the morning.   BALANCED  B-150 COMPLEX TR PO Take 1 tablet by mouth See admin instructions. Take 1 tablet by mouth once a day (contains b-1 @ 150 mg, b-2 150 mg, b-6 150 mg, b-12 @ 150 mg, and niacin 150 mg)   BALANCED B-150 PO Take 1 capsule by mouth daily.   busPIRone  7.5 MG tablet Commonly known as: BUSPAR  TAKE 1 TABLET BY MOUTH 3 TIMES  DAILY What changed: when to take this   CALCIUM  MAGNESIUM ZINC PO Take 1 tablet by mouth in the morning.   cefadroxil  500 MG capsule Commonly known as: DURICEF Take 2 capsules (1,000 mg total) by mouth 2 (two) times daily for 5 days.   clopidogrel  75 MG tablet Commonly known as: PLAVIX  Take 1 tablet (75 mg total) by mouth daily.   dexamethasone  4 MG tablet Commonly known as: DECADRON  Take 2 tabs at the night before and 2 tab the morning of chemotherapy, every 3 weeks, by mouth x 6 cycles   feeding supplement Liqd Take 237 mLs by mouth 2 (two) times daily between meals.   folic acid  800 MCG tablet Commonly known as: FOLVITE  Take 800 mcg by mouth daily.   lidocaine -prilocaine  cream Commonly known as: EMLA  Apply to affected area once   melatonin 5 MG Tabs Take 1 tablet (5 mg total) by mouth at bedtime as needed.   metoprolol  tartrate 25 MG tablet Commonly known as: LOPRESSOR  Take 1 tablet (25 mg total) by mouth 2 (two) times daily.   OMEGA-3 FISH OIL PO Take 4,000 mg by mouth daily.   oxyCODONE  5 MG immediate release tablet Commonly known as: Oxy IR/ROXICODONE  Take 1 tablet (5 mg total) by mouth every 4 (four) hours as needed for moderate pain (pain score 4-6) or breakthrough pain. What changed: reasons to take this   polyethylene glycol 17 g packet Commonly known as: MIRALAX  / GLYCOLAX  Take 17 g by mouth 2 (two) times daily. Replaces: polyethylene glycol powder 17 GM/SCOOP powder   prochlorperazine  10 MG tablet Commonly known as: COMPAZINE  Take 1 tablet (10 mg total) by mouth every 6 (six) hours as needed for nausea or vomiting.   promethazine  25  MG tablet Commonly known as: PHENERGAN  Take 1 tablet (25 mg total) by mouth every 6 (six) hours as needed for nausea or vomiting.   senna 8.6 MG Tabs tablet Commonly known as: SENOKOT Take 1 tablet (8.6 mg total) by mouth at bedtime.   vitamin A 8000 UNIT capsule Take 8,000 Units by mouth daily.   VITAMIN C CR 1500 MG Tbcr Take 1,500 mg by mouth daily.   Vitamin D3 125 MCG (5000 UT) Caps Take 5,000 Units by mouth daily with breakfast.   VITAMIN E-1000 PO Take 1,000 Units by mouth daily.               Discharge Care Instructions  (  From admission, onward)           Start     Ordered   08/06/24 0000  Discharge wound care:        08/06/24 1133             Follow ups:    Contact information for follow-up providers     [name], MD Follow up.   Specialty: Family Medicine Contact information: [address] [place] [address] [phone]         [facility] Follow up.   Contact information: [address] [place] [address] [place]  [address] [phone]        Lonn Hicks, MD Follow up.   Specialty: Hematology and Oncology Contact information: [address] [place] [address] [phone]              Contact information for after-discharge care     Destination     [facility] .   Service: Skilled Nursing Contact information: [address] [place]  [address] [phone]                     Discharge Instructions:   Discharge Instructions     Amb Referral to Palliative Care   Complete by: As directed    Call MD for:  difficulty breathing, headache or visual disturbances   Complete by: As directed    Call MD for:  extreme fatigue   Complete by: As directed    Call MD for:  hives   Complete by: As directed    Call MD for:  persistant dizziness or light-headedness   Complete by: As directed    Call MD for:   persistant nausea and vomiting   Complete by: As directed    Call MD for:  severe uncontrolled pain   Complete by: As directed    Call MD for:  temperature >100.4   Complete by: As directed    Diet general   Complete by: As directed    Discharge instructions   Complete by: As directed    Recommendations at discharge:   Complete the course of antibiotics with cefadroxil , EOT 1/13  Follow-up with urology as an outpatient  Continue to follow-up with oncology as an outpatient for chemotherapy plan  PDMP reviewed this encounter.   Opioid taper instructions: It is important to wean off of your opioid medication as soon as possible. If you do not need pain medication after your surgery it is ok to stop day one. Opioids include: Codeine, Hydrocodone(Norco, Vicodin), Oxycodone (Percocet, oxycontin ) and hydromorphone  amongst others.  Long term and even short term use of opiods can cause: Increased pain response Dependence Constipation Depression Respiratory depression And more.  Withdrawal symptoms can include Flu like symptoms Nausea, vomiting And more Techniques to manage these symptoms Hydrate well Eat regular healthy meals Stay active Use relaxation techniques(deep breathing, meditating, yoga) Do Not substitute Alcohol  to help with tapering If you have been on opioids for less than two weeks and do not have pain than it is ok to stop all together.  Plan to wean off of opioids This plan should start within one week post op of your joint replacement. Maintain the same interval or time between taking each dose and first decrease the dose.  Cut the total daily intake of opioids by one tablet each day Next start to increase the time between doses. The last dose that should be  eliminated is the evening dose.        General discharge instructions: Follow with Primary MD [name], MD in 7 days  Please request your PCP  to go over your hospital tests, procedures,  radiology results at the follow up. Please get your medicines reviewed and adjusted.  Your PCP may decide to repeat certain labs or tests as needed. Do not drive, operate heavy machinery, perform activities at heights, swimming or participation in water activities or provide baby sitting services if your were admitted for syncope or siezures until you have seen by Primary MD or a Neurologist and advised to do so again.   Controlled Substance Reporting System database was reviewed. Do not drive, operate heavy machinery, perform activities at heights, swim, participate in water activities or provide baby-sitting services while on medications for pain, sleep and mood until your outpatient physician has reevaluated you and advised to do so again.  You are strongly recommended to comply with the dose, frequency and duration of prescribed medications. Activity: As tolerated with Full fall precautions use walker/cane & assistance as needed Avoid using any recreational substances like cigarette, tobacco, alcohol , or non-prescribed drug. If you experience worsening of your admission symptoms, develop shortness of breath, life threatening emergency, suicidal or homicidal thoughts you must seek medical attention immediately by calling 911 or calling your MD immediately  if symptoms less severe. You must read complete instructions/literature along with all the possible adverse reactions/side effects for all the medicines you take and that have been prescribed to you. Take any new medicine only after you have completely understood and accepted all the possible adverse reactions/side effects.  Wear Seat belts while driving. You were cared for by a hospitalist during your hospital stay. If you have any questions about your discharge medications or the care you received while you were in the hospital after you are discharged, you can call the unit and ask to speak with the hospitalist or the covering physician.  Once you are discharged, your primary care physician will handle any further medical issues. Please note that NO REFILLS for any discharge medications will be authorized once you are discharged, as it is imperative that you return to your primary care physician (or establish a relationship with a primary care physician if you do not have one).   Discharge wound care:   Complete by: As directed    Increase activity slowly   Complete by: As directed        Discharge Exam:   Vitals:   08/05/24 1342 08/05/24 2034 08/06/24 0538 08/06/24 1048  BP: 126/76 (!) 140/80 (!) 140/71 (!) 143/92  Pulse: 93 (!) 103 87 100  Resp: 18 18 18 20   Temp: 98.2 F (36.8 C) 97.9 F (36.6 C) 98.4 F (36.9 C) 98.6 F (37 C)  TempSrc: Oral Oral Oral Oral  SpO2: 94% 96% 95% 95%  Weight:      Height:        Body mass index is 18.54 kg/m.   General exam: Pleasant, elderly Caucasian female.  Not in distress Skin: No rashes, lesions or ulcers. HEENT: Atraumatic, normocephalic, no obvious bleeding Lungs: Clear to auscultation bilaterally, diminished air entry in both bases CVS: S1, S2, no murmur,   GI/Abd: Soft, nontender, bowel sound present,   CNS: Alert, awake, oriented to place and person.  Slow to respond Psychiatry: Mood appropriate Extremities: No pedal edema, no calf tenderness,    The results of significant diagnostics from this hospitalization (including  imaging, microbiology, ancillary and laboratory) are listed below for reference.    Procedures and Diagnostic Studies:   CT ABDOMEN PELVIS W CONTRAST Addendum Date: 07/26/2024  ADDENDUM #1  ADDENDUM: I discussed these findings with Dr. patsey in the emergency room at 10:13 pm Electronically signed by: Dorethia Molt MD 07/26/2024 10:14 PM EST RP Workstation: [id number]   Result Date: 07/26/2024  ORIGINAL REPORT  EXAM: CT ABDOMEN AND PELVIS WITH CONTRAST 07/26/2024 08:49:59 PM TECHNIQUE: CT of the abdomen and pelvis was performed with the  administration of intravenous contrast. Multiplanar reformatted images are provided for review. Automated exposure control, iterative reconstruction, and/or weight-based adjustment of the mA/kV was utilized to reduce the radiation dose to as low as reasonably achievable. COMPARISON: 06/22/2024 CLINICAL HISTORY: Abdominal pain, acute, nonlocalized. Diarrhea and generalized weakness. FINDINGS: LOWER CHEST: Moderate bilateral pleural effusions were again identified with compressive atelectasis of the lung bases bilaterally. Moderate hiatal hernia. LIVER: Stable periportal edema. Simple cysts are seen scattered throughout the liver, unchanged. GALLBLADDER AND BILE DUCTS: Gallbladder is unremarkable. No biliary ductal dilatation. SPLEEN: No acute abnormality. PANCREAS: No acute abnormality. ADRENAL GLANDS: No acute abnormality. KIDNEYS, URETERS AND BLADDER: Simple cortical cysts are again identified within the kidneys bilaterally for which follow-up imaging is recommended. There is mild left hydronephrosis and distention of the left renal pelvis with decompression of the ureter in keeping with an underlying UPJ obstruction, stable since prior examination. Preserved left renal cortical thickness. The kidneys are otherwise unremarkable. There is extensive intramural gas circumferentially within the bladder with extension of gas into the space of Retzius in keeping with severe, diffuse emphysematous cystitis, a finding seen with severe infection in typically immunosuppressed or diabetic patients. Moderate gas is seen in the bladder lumen. GI AND BOWEL: Stomach demonstrates no acute abnormality. Impacted stool is seen within the rectal vault reflecting changes of fecal impaction. There is superimposed circumferential bowel wall thickening and edema involving the sigmoid colon suggesting an infectious or inflammatory colitis. No obstruction. PERITONEUM AND RETROPERITONEUM: The large volume ascites is again identified,  improved since prior examination. There is, however, progressive omental caking and peritoneal thickening and nodularity identified in keeping with peritoneal carcinomatosis. No free intraperitoneal gas. VASCULATURE: Aorta is normal in caliber. LYMPH NODES: No lymphadenopathy. Delay more iterations of the cough REPRODUCTIVE ORGANS: Large calcified intramural fibroid within the uterus. BONES AND SOFT TISSUES: No acute osseous abnormality. No focal soft tissue abnormality. IMPRESSION: 1. Severe, diffuse emphysematous cystitis with extensive intramural gas and moderate intraluminal gas, consistent with severe infection and high risk for sepsis, typically seen in immunosuppressed or diabetic patients. Progressive omental caking with peritoneal thickening and nodularity, consistent with peritoneal carcinomatosis. Superimposed circumferential bowel wall thickening and edema involving the sigmoid colon, suggesting infectious or inflammatory colitis; no bowel obstruction. Large volume ascites, improved since prior examination. Moderate bilateral pleural effusions with compressive atelectasis at the lung bases. Left hydronephrosis with distention of the left renal pelvis and decompressed ureter consistent with chronic UPJ obstruction; left renal cortical thickness preserved. Multiple simple hepatic cysts and bilateral simple cortical renal cysts, unchanged; no follow-up recommended for simple cysts. Electronically signed by: Dorethia Molt MD 07/26/2024 10:05 PM EST RP Workstation: [id number]     Labs:   Basic Metabolic Panel: Recent Labs  Lab 07/31/24 0519 08/01/24 0526 08/06/24 0844  NA 136 136 139  K 4.5 4.5 4.0  CL 100 103 105  CO2 26 25 26   GLUCOSE 102* 92 89  BUN 18 20 17   CREATININE 0.52 0.53 0.47  CALCIUM  9.1 9.2 8.7*  MG 2.1 2.1  --   PHOS 2.9 3.1  --    GFR Estimated Creatinine Clearance: 45.6 mL/min (by C-G formula based on SCr of 0.47 mg/dL). Liver Function Tests: No results for input(s):  AST, ALT, ALKPHOS, BILITOT, PROT, ALBUMIN  in the last 168 hours. No results for input(s): LIPASE, AMYLASE in the last 168 hours. No results for input(s): AMMONIA in the last 168 hours. Coagulation profile No results for input(s): INR, PROTIME in the last 168 hours.  CBC: Recent Labs  Lab 07/31/24 0519 08/01/24 0526 08/02/24 0436 08/03/24 0455 08/04/24 0420  WBC 1.3* 1.6* 1.9* 1.9* 2.9*  NEUTROABS  --  0.4* 0.6* 0.8* 1.5*  HGB 10.6* 10.5* 10.5* 9.7* 10.1*  HCT 32.3* 33.0* 32.5* 30.7* 31.4*  MCV 82.0 84.0 83.5 85.8 84.6  PLT 398 402* 440* 451* 485*   Cardiac Enzymes: No results for input(s): CKTOTAL, CKMB, CKMBINDEX, TROPONINI in the last 168 hours. BNP: Invalid input(s): POCBNP CBG: No results for input(s): GLUCAP in the last 168 hours. D-Dimer No results for input(s): DDIMER in the last 72 hours. Hgb A1c No results for input(s): HGBA1C in the last 72 hours. Lipid Profile No results for input(s): CHOL, HDL, LDLCALC, TRIG, CHOLHDL, LDLDIRECT in the last 72 hours. Thyroid  function studies No results for input(s): TSH, T4TOTAL, T3FREE, THYROIDAB in the last 72 hours.  Invalid input(s): FREET3 Anemia work up No results for input(s): VITAMINB12, FOLATE, FERRITIN, TIBC, IRON, RETICCTPCT in the last 72 hours. Microbiology No results found for this or any previous visit (from the past 240 hours).  Time coordinating discharge: 45 minutes  Signed: Blayden Conwell  [facility] 08/06/2024, 11:33 AM  "

## 2024-08-06 NOTE — TOC Transition Note (Signed)
 Transition of Care [facility]) - Discharge Note  Patient Details  Name: Michele Tyler MRN: [id number] Date of Birth: 1946/01/24  Transition of Care [facility]) CM/SW Contact:  Duwaine GORMAN Aran, LCSW Phone Number: 08/06/2024, 1:01 PM  Clinical Narrative: Patient has insurance approval to go to [facility]. Plan auth # is: [id number]. Reference ID# is: [id number]. Patient is approved for 08/05/2024-08/09/2024. CSW confirmed with hospitalist and admissions at [facility] that patient can admit today. Patient will go to room 301 and the number for report is [phone]. Discharge summary, discharge orders, and SNF transfer report faxed to facility in hub. Medical necessity form done; PTAR scheduled. Discharge packet completed. CSW met with patient and friends to provide update. Friends had several questions regarding POA paperwork. CSW reached out to chaplain to follow up with POA questions. RN updated. Care management signing off.  Final next level of care: Skilled Nursing Facility Barriers to Discharge: Barriers Resolved  Patient Goals and CMS Choice Patient states their goals for this hospitalization and ongoing recovery are:: return home following rehab  Discharge Placement Existing PASRR number confirmed : 08/02/24          Patient chooses bed at: [facility] Patient to be transferred to facility by: PTAR Name of family member notified: Paralee (friend) Patient and family notified of of transfer: 08/06/24  Discharge Plan and Services Additional resources added to the After Visit Summary for   In-house Referral: Clinical Social Work Post Acute Care Choice: Skilled Nursing Facility          DME Arranged: N/A DME Agency: NA  Social Drivers of Health (SDOH) Interventions SDOH Screenings   Food Insecurity: No Food Insecurity (07/27/2024)  Housing: Low Risk (07/27/2024)  Transportation Needs: No Transportation Needs (07/27/2024)  Utilities: Not At Risk (07/27/2024)  Alcohol  Screen: Low Risk  (08/04/2023)  Depression (PHQ2-9): High Risk (06/09/2024)  Financial Resource Strain: Low Risk (08/04/2023)  Physical Activity: Sufficiently Active (08/04/2023)  Social Connections: Unknown (07/27/2024)  Recent Concern: Social Connections - Moderately Isolated (06/22/2024)  Stress: Stress Concern Present (08/04/2023)  Tobacco Use: Medium Risk (07/28/2024)   Readmission Risk Interventions    08/02/2024    3:14 PM  Readmission Risk Prevention Plan  Transportation Screening Complete  PCP or Specialist Appt within 3-5 Days Complete  HRI or Home Care Consult Complete  Social Work Consult for Recovery Care Planning/Counseling Complete  Palliative Care Screening Complete  Medication Review Oceanographer) Complete

## 2024-08-06 NOTE — Progress Notes (Signed)
 I met with Deane and [name] to follow up about questions they still had regarding Michele Tyler's HCPOA paperwork.  I also had the opportunity to speak with April, her caregiver and [name] (her designee as HCPOA) to let them know that we were unable to notarize the documents, but that her wishes to have [name] be her HCPOA were documented by multiple staff whom she spoke with. All questions were answered.  I also connected Michele Tyler with the chaplain at the [facility] who can follow her through her treatments.    [address] Bcc Pager, [phone]

## 2024-08-06 NOTE — Progress Notes (Signed)
 Report given to admitting RN at [facility] @ 1223.

## 2024-08-06 NOTE — Progress Notes (Signed)
 I met with Michele Tyler alongside her friends, [name] and [name], to answer questions about the HCPOA documents that we were unable to notarize on Tuesday. They would like to attempt to get document notarized at [facility].  I took the original un-notarized document from her shadow chart and brought the original to her.  In the shadow chart, I left a photocopy of the document stating her wishes that [name] be her HCPOA.    [address], Bcc Pager, [phone]

## 2024-08-06 NOTE — Care Management Important Message (Signed)
 Important Message  Patient Details IM Letter given. Name: Michele Tyler MRN: [id number] Date of Birth: 07-Jan-1946   Important Message Given:  Yes - Medicare IM     [name] 08/06/2024, 12:28 PM

## 2024-08-09 ENCOUNTER — Ambulatory Visit (HOSPITAL_COMMUNITY)

## 2024-08-09 ENCOUNTER — Ambulatory Visit: Admitting: Family Medicine

## 2024-08-09 ENCOUNTER — Inpatient Hospital Stay: Admitting: Hematology and Oncology

## 2024-08-09 ENCOUNTER — Other Ambulatory Visit (HOSPITAL_COMMUNITY)

## 2024-08-09 ENCOUNTER — Inpatient Hospital Stay

## 2024-08-12 ENCOUNTER — Ambulatory Visit: Admitting: Family Medicine

## 2024-08-18 ENCOUNTER — Telehealth: Payer: Self-pay | Admitting: Neurology

## 2024-08-18 NOTE — Telephone Encounter (Signed)
 I got a surgical clearance form from gynecology oncology for surgical procedure needed for GYN malignancy.  Asking for recommendation of when she can come off Plavix  for surgery after 10/14/2024.  Since I last saw her, she was admitted for another stroke.  I called # listed, caregiver April. She has been diagnosed with ovarian cancer. Had difficulty with chemo, admitted for weakness, diarrhea, readmitted 12/29. Admitted to [facility]. Stopped Chemo while at rehab.   Can we contact [facility] and see if we can arrange for HFU by before date requested 10/14/24.

## 2024-08-19 ENCOUNTER — Ambulatory Visit

## 2024-08-19 VITALS — Ht 64.0 in | Wt 108.0 lb

## 2024-08-19 DIAGNOSIS — Z0000 Encounter for general adult medical examination without abnormal findings: Secondary | ICD-10-CM | POA: Diagnosis not present

## 2024-08-19 DIAGNOSIS — Z1159 Encounter for screening for other viral diseases: Secondary | ICD-10-CM

## 2024-08-19 NOTE — Patient Instructions (Addendum)
 Michele Tyler,  Thank you for taking the time for your Medicare Wellness Visit. I appreciate your continued commitment to your health goals. Please review the care plan we discussed, and feel free to reach out if I can assist you further.  Please note that Annual Wellness Visits do not include a physical exam. Some assessments may be limited, especially if the visit was conducted virtually. If needed, we may recommend an in-person follow-up with your provider.  Ongoing Care Seeing your primary care provider every 3 to 6 months helps us  monitor your health and provide consistent, personalized care.   Referrals If a referral was made during today's visit and you haven't received any updates within two weeks, please contact the referred provider directly to check on the status.  Recommended Screenings:  Health Maintenance  Topic Date Due   Hepatitis C Screening  Never done   Zoster (Shingles) Vaccine (1 of 2) Never done   Pneumococcal Vaccine for age over 27 (2 of 2 - PCV) 09/07/2014   COVID-19 Vaccine (6 - 2025-26 season) 03/29/2024   DTaP/Tdap/Td vaccine (3 - Td or Tdap) 07/03/2025   Flu Shot  Completed   Osteoporosis screening with Bone Density Scan  Completed   Meningitis B Vaccine  Aged Out   Breast Cancer Screening  Discontinued   Colon Cancer Screening  Discontinued       08/19/2024    3:18 PM  Advanced Directives  Does Patient Have a Medical Advance Directive? Yes    Vision: Annual vision screenings are recommended for early detection of glaucoma, cataracts, and diabetic retinopathy. These exams can also reveal signs of chronic conditions such as diabetes and high blood pressure.  Dental: Annual dental screenings help detect early signs of oral cancer, gum disease, and other conditions linked to overall health, including heart disease and diabetes.

## 2024-08-19 NOTE — Telephone Encounter (Signed)
 FAXED LETTER TO [facility] FOR DR. PIA AT [phone]

## 2024-08-19 NOTE — Progress Notes (Signed)
 "  Chief Complaint  Patient presents with   Medicare Wellness     Subjective:   Michele Tyler is a 79 y.o. female who presents for a Medicare Annual Wellness Visit.  Visit info / Clinical Intake: Medicare Wellness Visit Type:: Subsequent Annual Wellness Visit Persons participating in visit and providing information:: patient Medicare Wellness Visit Mode:: Telephone If telephone:: video declined Since this visit was completed virtually, some vitals may be partially provided or unavailable. Missing vitals are due to the limitations of the virtual format.: Documented vitals are patient reported If Telephone or Video please confirm:: I connected with patient using audio/video enable telemedicine. I verified patient identity with two identifiers, discussed telehealth limitations, and patient agreed to proceed. Patient Location:: Home Provider Location:: Office Interpreter Needed?: No Pre-visit prep was completed: yes AWV questionnaire completed by patient prior to visit?: no Living arrangements:: (!) lives alone Patient's Overall Health Status Rating: good Typical amount of pain: none Does pain affect daily life?: no Are you currently prescribed opioids?: (!) yes  Dietary Habits and Nutritional Risks How many meals a day?: 2 (2-3) Eats fruit and vegetables daily?: yes Most meals are obtained by: preparing own meals; eating out In the last 2 weeks, have you had any of the following?: none Diabetic:: no  Functional Status Activities of Daily Living (to include ambulation/medication): Independent Ambulation: Independent with device- listed below Home Assistive Devices/Equipment: Eyeglasses; Walker (specify Type) Medication Administration: Independent Home Management (perform basic housework or laundry): Independent Manage your own finances?: yes Primary transportation is: family / friends Concerns about vision?: no *vision screening is required for WTM* Concerns about hearing?:  no  Fall Screening Falls in the past year?: 0 Number of falls in past year: 0 Was there an injury with Fall?: 0 Fall Risk Category Calculator: 0 Patient Fall Risk Level: Low Fall Risk  Fall Risk Patient at Risk for Falls Due to: No Fall Risks Fall risk Follow up: Falls evaluation completed; Falls prevention discussed  Home and Transportation Safety: All rugs have non-skid backing?: N/A, no rugs All stairs or steps have railings?: N/A, no stairs Grab bars in the bathtub or shower?: yes Have non-skid surface in bathtub or shower?: yes Good home lighting?: yes Regular seat belt use?: yes Hospital stays in the last year:: (!) yes (04/2024) How many hospital stays:: 7 Reason: dehydration  Cognitive Assessment Difficulty concentrating, remembering, or making decisions? : no Will 6CIT or Mini Cog be Completed: yes What year is it?: 0 points What month is it?: 0 points Give patient an address phrase to remember (5 components): [address] [place], [place] About what time is it?: 0 points Count backwards from 20 to 1: 0 points Say the months of the year in reverse: 4 points (Jan-Sept)  Advance Directives (For Healthcare) Does Patient Have a Medical Advance Directive?: Yes Does patient want to make changes to medical advance directive?: No - Patient declined Type of Advance Directive: Healthcare Power of [place]; Living will Copy of Healthcare Power of Attorney in Chart?: No - copy requested Copy of Living Will in Chart?: No - copy requested Would patient like information on creating a medical advance directive?: Yes (Inpatient - patient requests chaplain consult to create a medical advance directive)  Reviewed/Updated  Reviewed/Updated: Reviewed All (Medical, Surgical, Family, Medications, Allergies, Care Teams, Patient Goals)    Allergies (verified) Codeine   Current Medications (verified) Outpatient Encounter Medications as of 08/19/2024  Medication Sig   acetaminophen   (TYLENOL ) 500 MG tablet Take 1  tablet (500 mg total) by mouth every 6 (six) hours as needed for mild pain (pain score 1-3), fever or headache.   albuterol  (PROVENTIL ) (2.5 MG/3ML) 0.083% nebulizer solution Take 3 mLs (2.5 mg total) by nebulization every 2 (two) hours as needed for wheezing or shortness of breath.   amLODipine  (NORVASC ) 10 MG tablet Take 1 tablet (10 mg total) by mouth daily.   artificial tears ophthalmic solution Place 1 drop into both eyes as needed for dry eyes.   Ascorbic Acid (VITAMIN C CR) 1500 MG TBCR Take 1,500 mg by mouth daily.   atorvastatin  (LIPITOR) 10 MG tablet TAKE 1 TABLET BY MOUTH AT  BEDTIME   B Complex-Folic Acid  (BALANCED B-150 PO) Take 1 capsule by mouth daily.   busPIRone  (BUSPAR ) 7.5 MG tablet TAKE 1 TABLET BY MOUTH 3 TIMES  DAILY (Patient taking differently: Take 7.5 mg by mouth at bedtime.)   CALCIUM  MAGNESIUM ZINC PO Take 1 tablet by mouth in the morning.   Cholecalciferol  (VITAMIN D3) 5000 units CAPS Take 5,000 Units by mouth daily with breakfast.   clopidogrel  (PLAVIX ) 75 MG tablet Take 1 tablet (75 mg total) by mouth daily.   Cyanocobalamin  (B-12) 1000 MCG CAPS Take 1,000 mcg by mouth in the morning.   dexamethasone  (DECADRON ) 4 MG tablet Take 2 tabs at the night before and 2 tab the morning of chemotherapy, every 3 weeks, by mouth x 6 cycles   feeding supplement (ENSURE PLUS HIGH PROTEIN) LIQD Take 237 mLs by mouth 2 (two) times daily between meals.   folic acid  (FOLVITE ) 800 MCG tablet Take 800 mcg by mouth daily.   lidocaine -prilocaine  (EMLA ) cream Apply to affected area once   melatonin 5 MG TABS Take 1 tablet (5 mg total) by mouth at bedtime as needed.   metoprolol  tartrate (LOPRESSOR ) 25 MG tablet Take 1 tablet (25 mg total) by mouth 2 (two) times daily.   Omega-3 Fatty Acids (OMEGA-3 FISH OIL PO) Take 4,000 mg by mouth daily.   polyethylene glycol (MIRALAX  / GLYCOLAX ) 17 g packet Take 17 g by mouth 2 (two) times daily.   prochlorperazine   (COMPAZINE ) 10 MG tablet Take 1 tablet (10 mg total) by mouth every 6 (six) hours as needed for nausea or vomiting.   promethazine  (PHENERGAN ) 25 MG tablet Take 1 tablet (25 mg total) by mouth every 6 (six) hours as needed for nausea or vomiting.   senna (SENOKOT) 8.6 MG TABS tablet Take 1 tablet (8.6 mg total) by mouth at bedtime.   vitamin A 8000 UNIT capsule Take 8,000 Units by mouth daily.   VITAMIN E-1000 PO Take 1,000 Units by mouth daily.   Vitamins-Lipotropics (BALANCED B-150 COMPLEX TR PO) Take 1 tablet by mouth See admin instructions. Take 1 tablet by mouth once a day (contains b-1 @ 150 mg, b-2 150 mg, b-6 150 mg, b-12 @ 150 mg, and niacin 150 mg)   oxyCODONE  (OXY IR/ROXICODONE ) 5 MG immediate release tablet Take 1 tablet (5 mg total) by mouth every 4 (four) hours as needed for moderate pain (pain score 4-6) or breakthrough pain.   No facility-administered encounter medications on file as of 08/19/2024.    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   Stroke (cerebrum) [facility])    Past Surgical History:  Procedure Laterality Date   DILATION AND CURETTAGE OF UTERUS  2002   FACELIFT  2005   IR  IMAGING GUIDED PORT INSERTION  07/13/2024   IR PARACENTESIS  06/23/2024   TRANSESOPHAGEAL ECHOCARDIOGRAM (CATH LAB) N/A 06/16/2024   Procedure: TRANSESOPHAGEAL ECHOCARDIOGRAM;  Surgeon: Delford Maude BROCKS, MD;  Location: [facility] INVASIVE CV LAB;  Service: Cardiovascular;  Laterality: N/A;   Family History  Problem Relation Age of Onset   Hypertension Mother    Depression Father    Hypertension Father    Social History   Occupational History   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  Tobacco Use   Smoking status: Former    Current packs/day: 0.00    Average packs/day: 1 pack/day for 10.0 years (10.0 ttl pk-yrs)    Types: Cigarettes    Start date: 07/29/1976    Quit date: 1988    Years since quitting: 38.0    Smokeless tobacco: Never  Vaping Use   Vaping status: Never Used  Substance and Sexual Activity   Alcohol  use: Not Currently   Drug use: No   Sexual activity: Not Currently   Tobacco Counseling Counseling given: Yes  SDOH Screenings   Food Insecurity: No Food Insecurity (08/19/2024)  Housing: Unknown (08/19/2024)  Transportation Needs: No Transportation Needs (08/19/2024)  Utilities: Not At Risk (08/19/2024)  Alcohol  Screen: Low Risk (08/04/2023)  Depression (PHQ2-9): Low Risk (08/19/2024)  Recent Concern: Depression (PHQ2-9) - High Risk (06/09/2024)  Financial Resource Strain: Low Risk (08/04/2023)  Physical Activity: Inactive (08/19/2024)  Social Connections: Socially Isolated (08/19/2024)  Stress: No Stress Concern Present (08/19/2024)  Tobacco Use: Medium Risk (08/19/2024)  Health Literacy: Adequate Health Literacy (08/19/2024)   See flowsheets for full screening details  Depression Screen PHQ 2 & 9 Depression Scale- Over the past 2 weeks, how often have you been bothered by any of the following problems? Little interest or pleasure in doing things: 0 Feeling down, depressed, or hopeless (PHQ Adolescent also includes...irritable): 0 PHQ-2 Total Score: 0 Trouble falling or staying asleep, or sleeping too much: 0 Feeling tired or having little energy: 0 Poor appetite or overeating (PHQ Adolescent also includes...weight loss): 0 Feeling bad about yourself - or that you are a failure or have let yourself or your family down: 0 Trouble concentrating on things, such as reading the newspaper or watching television (PHQ Adolescent also includes...like school work): 0 Moving or speaking so slowly that other people could have noticed. Or the opposite - being so fidgety or restless that you have been moving around a lot more than usual: 3 (uses a walker) Thoughts that you would be better off dead, or of hurting yourself in some way: 0 PHQ-9 Total Score: 3 If you checked off any problems, how  difficult have these problems made it for you to do your work, take care of things at home, or get along with other people?: Not difficult at all  Depression Treatment Depression Interventions/Treatment : EYV7-0 Score <4 Follow-up Not Indicated     Goals Addressed               This Visit's Progress     Patient Stated (pt-stated)        Patient stated she plans to continue taking meds and being with her dogs             Objective:    Today's Vitals   08/19/24 1516  Weight: 108 lb (49 kg)  Height: 5' 4 (1.626 m)   Body mass index is 18.54 kg/m.  Hearing/Vision screen Hearing Screening - Comments:: Denies hearing difficulties   Vision Screening - Comments:: Wears rx  glasses - up to date with routine eye exams with an Optometrist Immunizations and Health Maintenance Health Maintenance  Topic Date Due   Hepatitis C Screening  Never done   Zoster Vaccines- Shingrix (1 of 2) Never done   Pneumococcal Vaccine: 50+ Years (2 of 2 - PCV) 09/07/2014   COVID-19 Vaccine (6 - 2025-26 season) 03/29/2024   DTaP/Tdap/Td (3 - Td or Tdap) 07/03/2025   Influenza Vaccine  Completed   Bone Density Scan  Completed   Meningococcal B Vaccine  Aged Out   Mammogram  Discontinued   Colonoscopy  Discontinued        Assessment/Plan:  This is a routine wellness examination for [place].  Patient Care Team: [name], MD as PCP - General (Family Medicine) Ren Ny, Joelle DEL, MD as PCP - Cardiology (Cardiology) Caleen Dirks, MD as Consulting Physician (Internal Medicine)  I have personally reviewed and noted the following in the patients chart:   Medical and social history Use of alcohol , tobacco or illicit drugs  Current medications and supplements including opioid prescriptions. Functional ability and status Nutritional status Physical activity Advanced directives List of other physicians Hospitalizations, surgeries, and ER visits in previous 12  months Vitals Screenings to include cognitive, depression, and falls Referrals and appointments  Orders Placed This Encounter  Procedures   Hepatitis C antibody    Standing Status:   Future    Expiration Date:   08/19/2025   In addition, I have reviewed and discussed with patient certain preventive protocols, quality metrics, and best practice recommendations. A written personalized care plan for preventive services as well as general preventive health recommendations were provided to patient.   Verdie CHRISTELLA Saba, CMA   08/19/2024   Return in 1 year (on 08/19/2025).  After Visit Summary: (MyChart) Due to this being a telephonic visit, the after visit summary with patients personalized plan was offered to patient via MyChart   Nurse Notes: scheduled an appt w/PCP for 08/2024; scheduled 2027 appt "

## 2024-08-19 NOTE — Telephone Encounter (Signed)
 I called and spoke to Diane at [facility] and rehab and stated that we needed her to be seen by [facility] pcp onsite there for surgical clearance as she has had another stroke. Diane stated that she would try to get her seen asap.   I will route back to NP Sarah to see if she would like me to fax the clearance form directly to them

## 2024-08-20 ENCOUNTER — Other Ambulatory Visit: Payer: Self-pay

## 2024-08-25 ENCOUNTER — Telehealth: Payer: Self-pay

## 2024-08-25 NOTE — Telephone Encounter (Signed)
 Patient's friend, April, called asking about palliative care. RN educated on palliative care, what it is that we do, what services are provided and answered all questions regarding palliative care service line. No further needs at this time.

## 2024-08-30 ENCOUNTER — Inpatient Hospital Stay

## 2024-09-03 ENCOUNTER — Telehealth: Payer: Self-pay

## 2024-09-03 NOTE — Telephone Encounter (Signed)
 Called and scheduled appt on 2/10 at 3:20 pm. Michele Tyler is aware of appt date/time.

## 2024-09-03 NOTE — Telephone Encounter (Signed)
-----   Message from Almarie Bedford, MD sent at 09/03/2024  4:03 PM EST ----- I can see her at 320 pm on Tuesday, make sure April is present

## 2024-09-07 ENCOUNTER — Inpatient Hospital Stay: Attending: Hematology and Oncology | Admitting: Hematology and Oncology

## 2024-09-09 ENCOUNTER — Inpatient Hospital Stay: Admitting: Family Medicine

## 2024-09-17 ENCOUNTER — Inpatient Hospital Stay: Admitting: Gynecologic Oncology

## 2024-09-21 ENCOUNTER — Inpatient Hospital Stay: Attending: Hematology and Oncology

## 2024-09-21 ENCOUNTER — Ambulatory Visit: Admitting: Family Medicine

## 2025-01-12 ENCOUNTER — Ambulatory Visit: Admitting: Neurology

## 2025-03-01 ENCOUNTER — Ambulatory Visit: Admitting: Neurology

## 2025-09-15 ENCOUNTER — Ambulatory Visit
